# Patient Record
Sex: Female | Born: 1950 | Race: Black or African American | Hispanic: No | Marital: Married | Smoking: Current every day smoker
Health system: Southern US, Community
[De-identification: ages and names within clinical notes are randomized; demographics above are authoritative.]

## PROBLEM LIST (undated history)

## (undated) DIAGNOSIS — T7840XA Allergy, unspecified, initial encounter: Secondary | ICD-10-CM

## (undated) DIAGNOSIS — J45909 Unspecified asthma, uncomplicated: Secondary | ICD-10-CM

## (undated) DIAGNOSIS — E1122 Type 2 diabetes mellitus with diabetic chronic kidney disease: Secondary | ICD-10-CM

## (undated) DIAGNOSIS — I5032 Chronic diastolic (congestive) heart failure: Secondary | ICD-10-CM

## (undated) DIAGNOSIS — G4730 Sleep apnea, unspecified: Secondary | ICD-10-CM

## (undated) DIAGNOSIS — Z8719 Personal history of other diseases of the digestive system: Secondary | ICD-10-CM

## (undated) DIAGNOSIS — M1990 Unspecified osteoarthritis, unspecified site: Secondary | ICD-10-CM

## (undated) DIAGNOSIS — K219 Gastro-esophageal reflux disease without esophagitis: Secondary | ICD-10-CM

## (undated) DIAGNOSIS — E119 Type 2 diabetes mellitus without complications: Secondary | ICD-10-CM

## (undated) DIAGNOSIS — J40 Bronchitis, not specified as acute or chronic: Secondary | ICD-10-CM

## (undated) DIAGNOSIS — K259 Gastric ulcer, unspecified as acute or chronic, without hemorrhage or perforation: Secondary | ICD-10-CM

## (undated) DIAGNOSIS — I10 Essential (primary) hypertension: Secondary | ICD-10-CM

## (undated) DIAGNOSIS — M810 Age-related osteoporosis without current pathological fracture: Secondary | ICD-10-CM

## (undated) DIAGNOSIS — N184 Chronic kidney disease, stage 4 (severe): Secondary | ICD-10-CM

## (undated) HISTORY — DX: Essential (primary) hypertension: I10

## (undated) HISTORY — DX: Unspecified osteoarthritis, unspecified site: M19.90

## (undated) HISTORY — DX: Allergy, unspecified, initial encounter: T78.40XA

## (undated) HISTORY — DX: Type 2 diabetes mellitus with diabetic chronic kidney disease: E11.22

## (undated) HISTORY — PX: TUBAL LIGATION: SHX77

## (undated) HISTORY — DX: Chronic kidney disease, stage 4 (severe): N18.4

## (undated) HISTORY — DX: Gastro-esophageal reflux disease without esophagitis: K21.9

## (undated) HISTORY — DX: Type 2 diabetes mellitus without complications: E11.9

## (undated) HISTORY — DX: Gastric ulcer, unspecified as acute or chronic, without hemorrhage or perforation: K25.9

## (undated) HISTORY — PX: DILATION AND CURETTAGE OF UTERUS: SHX78

## (undated) HISTORY — PX: EYE SURGERY: SHX253

## (undated) HISTORY — DX: Unspecified asthma, uncomplicated: J45.909

## (undated) HISTORY — DX: Age-related osteoporosis without current pathological fracture: M81.0

---
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
## 1997-10-08 ENCOUNTER — Emergency Department (HOSPITAL_COMMUNITY): Admission: EM | Admit: 1997-10-08 | Discharge: 1997-10-08 | Payer: Self-pay | Admitting: Emergency Medicine

## 1998-12-28 ENCOUNTER — Emergency Department (HOSPITAL_COMMUNITY): Admission: EM | Admit: 1998-12-28 | Discharge: 1998-12-28 | Payer: Self-pay | Admitting: Emergency Medicine

## 1998-12-28 ENCOUNTER — Encounter: Payer: Self-pay | Admitting: Emergency Medicine

## 2000-03-04 ENCOUNTER — Ambulatory Visit (HOSPITAL_COMMUNITY): Admission: RE | Admit: 2000-03-04 | Discharge: 2000-03-04 | Payer: Self-pay | Admitting: Internal Medicine

## 2000-03-04 ENCOUNTER — Encounter: Admission: RE | Admit: 2000-03-04 | Discharge: 2000-03-04 | Payer: Self-pay | Admitting: Internal Medicine

## 2000-03-04 ENCOUNTER — Encounter: Payer: Self-pay | Admitting: Internal Medicine

## 2000-03-18 ENCOUNTER — Encounter: Admission: RE | Admit: 2000-03-18 | Discharge: 2000-03-18 | Payer: Self-pay | Admitting: Internal Medicine

## 2000-04-15 ENCOUNTER — Encounter: Admission: RE | Admit: 2000-04-15 | Discharge: 2000-04-15 | Payer: Self-pay | Admitting: Internal Medicine

## 2000-06-28 ENCOUNTER — Encounter: Admission: RE | Admit: 2000-06-28 | Discharge: 2000-06-28 | Payer: Self-pay

## 2000-07-29 ENCOUNTER — Encounter: Admission: RE | Admit: 2000-07-29 | Discharge: 2000-07-29 | Payer: Self-pay | Admitting: Internal Medicine

## 2001-03-03 ENCOUNTER — Encounter: Admission: RE | Admit: 2001-03-03 | Discharge: 2001-03-03 | Payer: Self-pay | Admitting: Internal Medicine

## 2001-03-04 ENCOUNTER — Ambulatory Visit (HOSPITAL_COMMUNITY): Admission: RE | Admit: 2001-03-04 | Discharge: 2001-03-04 | Payer: Self-pay | Admitting: Internal Medicine

## 2001-03-04 ENCOUNTER — Encounter: Payer: Self-pay | Admitting: Internal Medicine

## 2001-03-10 ENCOUNTER — Encounter: Admission: RE | Admit: 2001-03-10 | Discharge: 2001-03-10 | Payer: Self-pay | Admitting: Internal Medicine

## 2001-04-28 ENCOUNTER — Encounter: Admission: RE | Admit: 2001-04-28 | Discharge: 2001-04-28 | Payer: Self-pay | Admitting: Internal Medicine

## 2001-05-19 ENCOUNTER — Encounter: Admission: RE | Admit: 2001-05-19 | Discharge: 2001-05-19 | Payer: Self-pay | Admitting: Internal Medicine

## 2001-11-03 ENCOUNTER — Encounter: Admission: RE | Admit: 2001-11-03 | Discharge: 2001-11-03 | Payer: Self-pay | Admitting: Internal Medicine

## 2002-07-31 ENCOUNTER — Encounter: Admission: RE | Admit: 2002-07-31 | Discharge: 2002-07-31 | Payer: Self-pay | Admitting: Internal Medicine

## 2002-07-31 ENCOUNTER — Ambulatory Visit (HOSPITAL_COMMUNITY): Admission: RE | Admit: 2002-07-31 | Discharge: 2002-07-31 | Payer: Self-pay | Admitting: Internal Medicine

## 2002-07-31 ENCOUNTER — Encounter: Payer: Self-pay | Admitting: Internal Medicine

## 2002-08-14 ENCOUNTER — Encounter: Admission: RE | Admit: 2002-08-14 | Discharge: 2002-08-14 | Payer: Self-pay | Admitting: Internal Medicine

## 2002-10-06 ENCOUNTER — Other Ambulatory Visit: Admission: RE | Admit: 2002-10-06 | Discharge: 2002-10-06 | Payer: Self-pay | Admitting: Internal Medicine

## 2002-10-06 ENCOUNTER — Encounter: Admission: RE | Admit: 2002-10-06 | Discharge: 2002-10-06 | Payer: Self-pay | Admitting: Internal Medicine

## 2002-10-19 ENCOUNTER — Encounter: Admission: RE | Admit: 2002-10-19 | Discharge: 2003-01-17 | Payer: Self-pay | Admitting: Internal Medicine

## 2002-10-21 ENCOUNTER — Encounter: Admission: RE | Admit: 2002-10-21 | Discharge: 2002-10-21 | Payer: Self-pay | Admitting: Internal Medicine

## 2002-10-21 ENCOUNTER — Encounter: Payer: Self-pay | Admitting: Internal Medicine

## 2002-12-02 ENCOUNTER — Encounter: Admission: RE | Admit: 2002-12-02 | Discharge: 2002-12-02 | Payer: Self-pay | Admitting: Internal Medicine

## 2003-12-29 ENCOUNTER — Ambulatory Visit (HOSPITAL_COMMUNITY): Admission: RE | Admit: 2003-12-29 | Discharge: 2003-12-29 | Payer: Self-pay | Admitting: Internal Medicine

## 2003-12-29 ENCOUNTER — Ambulatory Visit: Payer: Self-pay | Admitting: Internal Medicine

## 2004-07-11 ENCOUNTER — Ambulatory Visit: Payer: Self-pay | Admitting: Internal Medicine

## 2004-07-18 ENCOUNTER — Ambulatory Visit: Payer: Self-pay | Admitting: Internal Medicine

## 2005-08-20 ENCOUNTER — Ambulatory Visit: Payer: Self-pay | Admitting: Internal Medicine

## 2005-09-25 ENCOUNTER — Ambulatory Visit (HOSPITAL_COMMUNITY): Admission: RE | Admit: 2005-09-25 | Discharge: 2005-09-25 | Payer: Self-pay | Admitting: Internal Medicine

## 2007-03-24 ENCOUNTER — Emergency Department (HOSPITAL_COMMUNITY): Admission: EM | Admit: 2007-03-24 | Discharge: 2007-03-24 | Payer: Self-pay | Admitting: Emergency Medicine

## 2008-06-23 ENCOUNTER — Emergency Department (HOSPITAL_COMMUNITY): Admission: EM | Admit: 2008-06-23 | Discharge: 2008-06-23 | Payer: Self-pay | Admitting: Emergency Medicine

## 2009-02-28 ENCOUNTER — Encounter: Admission: RE | Admit: 2009-02-28 | Discharge: 2009-02-28 | Payer: Self-pay | Admitting: Family Medicine

## 2009-04-16 ENCOUNTER — Observation Stay (HOSPITAL_COMMUNITY): Admission: EM | Admit: 2009-04-16 | Discharge: 2009-04-16 | Payer: Self-pay | Admitting: Emergency Medicine

## 2009-11-22 ENCOUNTER — Encounter: Admission: RE | Admit: 2009-11-22 | Discharge: 2009-11-22 | Payer: Self-pay | Admitting: Family Medicine

## 2009-11-28 ENCOUNTER — Encounter: Admission: RE | Admit: 2009-11-28 | Discharge: 2009-11-28 | Payer: Self-pay | Admitting: Neurosurgery

## 2009-12-01 ENCOUNTER — Encounter: Admission: RE | Admit: 2009-12-01 | Discharge: 2009-12-01 | Payer: Self-pay | Admitting: Family Medicine

## 2010-01-20 ENCOUNTER — Encounter: Admission: RE | Admit: 2010-01-20 | Discharge: 2010-01-20 | Payer: Self-pay | Admitting: Neurosurgery

## 2010-04-09 ENCOUNTER — Encounter: Payer: Self-pay | Admitting: Family Medicine

## 2010-04-10 ENCOUNTER — Encounter: Payer: Self-pay | Admitting: *Deleted

## 2010-06-04 LAB — URINALYSIS, ROUTINE W REFLEX MICROSCOPIC
Hgb urine dipstick: NEGATIVE
Nitrite: NEGATIVE
Protein, ur: 100 mg/dL — AB
Specific Gravity, Urine: 1.022 (ref 1.005–1.030)
Urobilinogen, UA: 1 mg/dL (ref 0.0–1.0)

## 2010-06-04 LAB — DIFFERENTIAL
Eosinophils Absolute: 0 10*3/uL (ref 0.0–0.7)
Eosinophils Relative: 0 % (ref 0–5)
Lymphocytes Relative: 27 % (ref 12–46)
Lymphs Abs: 1.8 10*3/uL (ref 0.7–4.0)
Monocytes Relative: 15 % — ABNORMAL HIGH (ref 3–12)

## 2010-06-04 LAB — URINE MICROSCOPIC-ADD ON

## 2010-06-04 LAB — COMPREHENSIVE METABOLIC PANEL
ALT: 25 U/L (ref 0–35)
Albumin: 3.7 g/dL (ref 3.5–5.2)
Alkaline Phosphatase: 72 U/L (ref 39–117)
Calcium: 8.6 mg/dL (ref 8.4–10.5)
Potassium: 4.6 mEq/L (ref 3.5–5.1)
Sodium: 134 mEq/L — ABNORMAL LOW (ref 135–145)
Total Protein: 7.5 g/dL (ref 6.0–8.3)

## 2010-06-04 LAB — URINE CULTURE: Culture: NO GROWTH

## 2010-06-04 LAB — CBC
MCHC: 32.8 g/dL (ref 30.0–36.0)
MCV: 83.1 fL (ref 78.0–100.0)
Platelets: 205 10*3/uL (ref 150–400)
RBC: 5.43 MIL/uL — ABNORMAL HIGH (ref 3.87–5.11)
RDW: 14 % (ref 11.5–15.5)

## 2010-06-04 LAB — GLUCOSE, CAPILLARY: Glucose-Capillary: 174 mg/dL — ABNORMAL HIGH (ref 70–99)

## 2010-06-04 LAB — POCT CARDIAC MARKERS
CKMB, poc: 2 ng/mL (ref 1.0–8.0)
Troponin i, poc: <0.05 ng/mL (ref 0.00–0.09)

## 2010-06-04 LAB — LIPASE, BLOOD: Lipase: 25 U/L (ref 11–59)

## 2010-06-28 LAB — DIFFERENTIAL
Basophils Absolute: 0 10*3/uL (ref 0.0–0.1)
Lymphocytes Relative: 24 % (ref 12–46)
Lymphs Abs: 2.1 10*3/uL (ref 0.7–4.0)
Neutro Abs: 5.7 10*3/uL (ref 1.7–7.7)
Neutrophils Relative %: 66 % (ref 43–77)

## 2010-06-28 LAB — CBC
Platelets: 197 10*3/uL (ref 150–400)
RDW: 14.1 % (ref 11.5–15.5)
WBC: 8.7 10*3/uL (ref 4.0–10.5)

## 2010-06-28 LAB — POCT I-STAT, CHEM 8
BUN: 15 mg/dL (ref 6–23)
Chloride: 109 mEq/L (ref 96–112)
Creatinine, Ser: 1.2 mg/dL (ref 0.4–1.2)
Potassium: 4.1 mEq/L (ref 3.5–5.1)
Sodium: 139 mEq/L (ref 135–145)
TCO2: 23 mmol/L (ref 0–100)

## 2010-06-28 LAB — URINALYSIS, ROUTINE W REFLEX MICROSCOPIC
Glucose, UA: NEGATIVE mg/dL
Ketones, ur: NEGATIVE mg/dL
Leukocytes, UA: NEGATIVE
Protein, ur: 30 mg/dL — AB
Urobilinogen, UA: 0.2 mg/dL (ref 0.0–1.0)

## 2010-06-28 LAB — URINE MICROSCOPIC-ADD ON

## 2010-06-28 LAB — POCT CARDIAC MARKERS
CKMB, poc: <1 ng/mL — ABNORMAL LOW (ref 1.0–8.0)
Myoglobin, poc: 184 ng/mL (ref 12–200)
Troponin i, poc: <0.05 ng/mL (ref 0.00–0.09)

## 2010-12-07 LAB — I-STAT 8, (EC8 V) (CONVERTED LAB)
BUN: 9
Bicarbonate: 25.7 — ABNORMAL HIGH
Hemoglobin: 15.3 — ABNORMAL HIGH
Operator id: 281221
Potassium: 3.7
Sodium: 140
TCO2: 27

## 2012-12-05 DIAGNOSIS — M109 Gout, unspecified: Secondary | ICD-10-CM | POA: Insufficient documentation

## 2013-09-02 DIAGNOSIS — N183 Chronic kidney disease, stage 3 unspecified: Secondary | ICD-10-CM | POA: Insufficient documentation

## 2013-09-02 DIAGNOSIS — E1122 Type 2 diabetes mellitus with diabetic chronic kidney disease: Secondary | ICD-10-CM

## 2013-09-02 DIAGNOSIS — I10 Essential (primary) hypertension: Secondary | ICD-10-CM | POA: Insufficient documentation

## 2013-09-08 ENCOUNTER — Encounter: Payer: Self-pay | Admitting: Interventional Cardiology

## 2013-10-13 ENCOUNTER — Ambulatory Visit: Payer: Self-pay | Admitting: Cardiovascular Disease

## 2013-10-19 ENCOUNTER — Ambulatory Visit: Payer: Self-pay | Admitting: Interventional Cardiology

## 2013-10-27 ENCOUNTER — Ambulatory Visit: Payer: 59 | Admitting: Interventional Cardiology

## 2013-11-11 DIAGNOSIS — Z720 Tobacco use: Secondary | ICD-10-CM | POA: Insufficient documentation

## 2014-07-02 ENCOUNTER — Ambulatory Visit: Payer: Medicaid Other | Admitting: Family Medicine

## 2014-07-23 ENCOUNTER — Ambulatory Visit: Payer: Medicaid Other | Admitting: Family Medicine

## 2014-10-15 ENCOUNTER — Ambulatory Visit: Payer: Medicaid Other | Admitting: Internal Medicine

## 2014-10-25 ENCOUNTER — Ambulatory Visit (INDEPENDENT_AMBULATORY_CARE_PROVIDER_SITE_OTHER): Payer: Medicare HMO | Admitting: Family

## 2014-10-25 ENCOUNTER — Other Ambulatory Visit (INDEPENDENT_AMBULATORY_CARE_PROVIDER_SITE_OTHER): Payer: Medicare HMO

## 2014-10-25 ENCOUNTER — Encounter: Payer: Self-pay | Admitting: Family

## 2014-10-25 VITALS — BP 110/82 | HR 86 | Temp 98.5°F | Resp 18 | Ht 68.0 in | Wt 249.0 lb

## 2014-10-25 DIAGNOSIS — E1165 Type 2 diabetes mellitus with hyperglycemia: Secondary | ICD-10-CM | POA: Diagnosis not present

## 2014-10-25 DIAGNOSIS — E1149 Type 2 diabetes mellitus with other diabetic neurological complication: Secondary | ICD-10-CM | POA: Diagnosis not present

## 2014-10-25 DIAGNOSIS — L304 Erythema intertrigo: Secondary | ICD-10-CM | POA: Diagnosis not present

## 2014-10-25 DIAGNOSIS — K219 Gastro-esophageal reflux disease without esophagitis: Secondary | ICD-10-CM | POA: Insufficient documentation

## 2014-10-25 DIAGNOSIS — E119 Type 2 diabetes mellitus without complications: Secondary | ICD-10-CM | POA: Insufficient documentation

## 2014-10-25 DIAGNOSIS — M545 Low back pain: Secondary | ICD-10-CM

## 2014-10-25 DIAGNOSIS — G8929 Other chronic pain: Secondary | ICD-10-CM

## 2014-10-25 DIAGNOSIS — E1140 Type 2 diabetes mellitus with diabetic neuropathy, unspecified: Secondary | ICD-10-CM | POA: Insufficient documentation

## 2014-10-25 DIAGNOSIS — IMO0002 Reserved for concepts with insufficient information to code with codable children: Secondary | ICD-10-CM

## 2014-10-25 DIAGNOSIS — E118 Type 2 diabetes mellitus with unspecified complications: Secondary | ICD-10-CM

## 2014-10-25 LAB — BASIC METABOLIC PANEL
BUN: 22 mg/dL (ref 6–23)
CHLORIDE: 104 meq/L (ref 96–112)
CO2: 23 mEq/L (ref 19–32)
Calcium: 9.6 mg/dL (ref 8.4–10.5)
Creatinine, Ser: 1.96 mg/dL — ABNORMAL HIGH (ref 0.40–1.20)
GFR: 33.16 mL/min — AB (ref >60.00)
Glucose, Bld: 238 mg/dL — ABNORMAL HIGH (ref 70–99)
Potassium: 4.2 mEq/L (ref 3.5–5.1)
Sodium: 137 mEq/L (ref 135–145)

## 2014-10-25 LAB — HEMOGLOBIN A1C: HEMOGLOBIN A1C: 8.4 % — AB (ref 4.6–6.5)

## 2014-10-25 MED ORDER — OXYCODONE-ACETAMINOPHEN 5-325 MG PO TABS: 1.0000 | ORAL_TABLET | Freq: Three times a day (TID) | ORAL | Status: DC | PRN

## 2014-10-25 MED ORDER — CLOTRIMAZOLE-BETAMETHASONE 1-0.05 % EX CREA
1.0000 | TOPICAL_CREAM | Freq: Two times a day (BID) | CUTANEOUS | Status: DC
Start: 2014-10-25 — End: 2015-02-25

## 2014-10-25 MED ORDER — GABAPENTIN 300 MG PO CAPS: ORAL_CAPSULE | ORAL | Status: DC

## 2014-10-25 NOTE — Assessment & Plan Note (Signed)
Rash presentation consistent with intertrigo. Start Lotrisone cream as needed. Keep area clean and dry. Follow-up if symptoms worsen or fail to improve.

## 2014-10-25 NOTE — Assessment & Plan Note (Signed)
Currently maintained on glipizide. Takes her medication as prescribed and denies hypoglycemic events. Obtain A1c. Follow up pending A1c results.

## 2014-10-25 NOTE — Patient Instructions (Signed)
Thank you for choosing [facility].  Summary/Instructions:  Your prescription(s) have been submitted to your pharmacy or been printed and provided for you. Please take as directed and contact our office if you believe you are having problem(s) with the medication(s) or have any questions.  Please stop by the lab on the basement level of the building for your blood work. Your results will be released to [place] (or called to you) after review, usually within 72 hours after test completion. If any changes need to be made, you will be notified at that same time.  Referrals have been made during this visit. You should expect to hear back from our schedulers in about 7-10 days in regards to establishing an appointment with the specialists we discussed.   If your symptoms worsen or fail to improve, please contact our office for further instruction, or in case of emergency go directly to the emergency room at the closest medical facility.    Intertrigo Intertrigo is a skin condition that occurs in between folds of skin in places on the body that rub together a lot and do not get much ventilation. It is caused by heat, moisture, friction, sweat retention, and lack of air circulation, which produces red, irritated patches and, sometimes, scaling or drainage. People who have diabetes, who are obese, or who have treatment with antibiotics are at increased risk for intertrigo. The most common sites for intertrigo to occur include:  The groin.  The breasts.  The armpits.  Folds of abdominal skin.  Webbed spaces between the fingers or toes. Intertrigo may be aggravated by:  Sweat.  Feces.  Yeast or bacteria that are present near skin folds.  Urine.  Vaginal discharge. HOME CARE INSTRUCTIONS  The following steps can be taken to reduce friction and keep the affected area cool and dry:  Expose skin folds to the air.  Keep deep skin folds separated with cotton or linen cloth. Avoid  tight fitting clothing that could cause chafing.  Wear open-toed shoes or sandals to help reduce moisture between the toes.  Apply absorbent powders to affected areas as directed by your caregiver.  Apply over-the-counter barrier pastes, such as zinc oxide, as directed by your caregiver.  If you develop a fungal infection in the affected area, your caregiver may have you use antifungal creams. SEEK MEDICAL CARE IF:   The rash is not improving after 1 week of treatment.  The rash is getting worse (more red, more swollen, more painful, or spreading).  You have a fever or chills. MAKE SURE YOU:   Understand these instructions.  Will watch your condition.  Will get help right away if you are not doing well or get worse. Document Released: 03/05/2005 Document Revised: 05/28/2011 Document Reviewed: 08/18/2009 [facility] Patient Information 2015 [place], [place]. This information is not intended to replace advice given to you by your health care provider. Make sure you discuss any questions you have with your health care provider.

## 2014-10-25 NOTE — Assessment & Plan Note (Signed)
Symptoms are rib pain consistent with GERD or irritation of her hiatal hernia. Continue current treatment with ranitidine. Follow-up if symptoms worsen or fail to improve.

## 2014-10-25 NOTE — Assessment & Plan Note (Addendum)
Chronic pain noted in multiple joints including back and hips. Previously maintained on Percocet. Discussed appropriate pain management techniques and physical activity combined with exercise. Denies constipation. Refer patient to pain management for chronic pain management. Refill Percocet pending pain management referral.

## 2014-10-25 NOTE — Assessment & Plan Note (Signed)
Numbness and tingling symptoms consistent with diabetic neuropathy. Obtain A1c to determine current diabetic status. Continue gabapentin for neuropathic pain as needed. Diabetic foot exam completed and within normal limits. Follow-up pending A1c.

## 2014-10-25 NOTE — Progress Notes (Signed)
Pre visit review using our clinic review tool, if applicable. No additional management support is needed unless otherwise documented below in the visit note. 

## 2014-10-25 NOTE — Progress Notes (Signed)
Subjective:    Patient ID: Zoe Clarke, female    DOB: Dec 04, 1950, 64 y.o.   MRN: [id number]  Chief Complaint  Patient presents with  . Establish Care    think she has a rash on her back, itches really bad after shower, feet feel numbs, sharp pain on under her left rib    HPI:  Zoe Clarke is a 64 y.o. female with a PMH of seasonal allergies, arthritis, asthma, diabetes, GERD, hypertension, and tubal ligation who presents today for an office visit to establish care.   1.) Feet feel numb - Associated symptom of numbness and tingling located in her feet and has been going on for at least a week. Modifying factors include gabapentin does help along with her husband rubbing her feet. Has previously tried Lyrica and did not like how it make her felt. She had her last A1c done about 1 year ago and is currently maintained on glipizide. Takes the medication daily and denies adverse side effects or hypoglycemic events.   2.) Rash - Associated symptom of a spot located on her back has been going on for . Modifying factors include calamine and lotions which have helped a little with the itching. The rash is described as primarily itchy and gets worse after she gets out of the shower.  3.) Pain under rib - Associated symptom of a pain located just under her left breast has been going on since Wednesday. Notes that the pain is described as sharp and is modified with the use of antiacids. Timing of the symptoms is worse at night and especially when lying down. Denies fevers, chills, nausea, vomiting, or blood in stool.   No Known Allergies   No outpatient prescriptions prior to visit.   No facility-administered medications prior to visit.     Past Medical History  Diagnosis Date  . Arthritis   . Diabetes mellitus without complication   . Asthma   . GERD (gastroesophageal reflux disease)   . Allergy   . Hypertension      Past Surgical History  Procedure Laterality Date  . Tubal  ligation       Family History  Problem Relation Age of Onset  . Arthritis Mother   . Heart disease Mother   . Hypertension Mother   . Arthritis Maternal Grandmother   . Hypertension Maternal Grandmother   . Stroke Maternal Grandfather      History   Social History  . Marital Status: Married    Spouse Name: N/A  . Number of Children: 2  . Years of Education: 12   Occupational History  . Housewife    Social History Main Topics  . Smoking status: Current Every Day Smoker -- 0.50 packs/day for 39 years    Types: Cigarettes  . Smokeless tobacco: Never Used  . Alcohol Use: No  . Drug Use: Yes    Special: Marijuana  . Sexual Activity: Not on file   Other Topics Concern  . Not on file   Social History Narrative   Fun: Race car games - PS2/3, paint by number, board and card games, puzzle books   Feels safe at home and denies abuse    Review of Systems  Constitutional: Negative for fever and chills.  Gastrointestinal: Positive for abdominal pain. Negative for nausea, vomiting, diarrhea, constipation and blood in stool.  Endocrine: Negative for polydipsia, polyphagia and polyuria.  Neurological: Positive for numbness. Negative for weakness.      Objective:  BP 110/82 mmHg  Pulse 86  Temp(Src) 98.5 F (36.9 C) (Oral)  Resp 18  Ht 5\' 8"  (1.727 m)  Wt 249 lb (112.946 kg)  BMI 37.87 kg/m2  SpO2 98% Nursing note and vital signs reviewed.  Physical Exam  Constitutional: She is oriented to person, place, and time. She appears well-developed and well-nourished. No distress.  Cardiovascular: Normal rate, regular rhythm, normal heart sounds and intact distal pulses.   Pulmonary/Chest: Effort normal and breath sounds normal.  Abdominal: Soft. She exhibits no mass. There is no tenderness. There is no rebound and no guarding.  Neurological: She is alert and oriented to person, place, and time.  Diabetic foot exam - bilateral feet are free from skin breakdown, cuts, and  abrasions. Toenails are neatly trimmed. Pulses are intact and appropriate. Sensation is intact to monofilament bilaterally.  Skin: Skin is warm and dry. Rash noted.  Diffuse scaly rash noted in areas of folded skin.  Psychiatric: She has a normal mood and affect. Her behavior is normal. Judgment and thought content normal.       Assessment & Plan:   Problem List Items Addressed This Visit      Digestive   GERD (gastroesophageal reflux disease)    Symptoms are rib pain consistent with GERD or irritation of her hiatal hernia. Continue current treatment with ranitidine. Follow-up if symptoms worsen or fail to improve.      Relevant Medications   ranitidine (ZANTAC) 150 MG tablet     Nervous and Auditory   Diabetic neuropathy    Numbness and tingling symptoms consistent with diabetic neuropathy. Obtain A1c to determine current diabetic status. Continue gabapentin for neuropathic pain as needed. Diabetic foot exam completed and within normal limits. Follow-up pending A1c.      Relevant Medications   glipiZIDE (GLUCOTROL) 10 MG tablet   lisinopril-hydrochlorothiazide (PRINZIDE,ZESTORETIC) 20-25 MG per tablet     Musculoskeletal and Integument   Intertrigo    Rash presentation consistent with intertrigo. Start Lotrisone cream as needed. Keep area clean and dry. Follow-up if symptoms worsen or fail to improve.        Other   Type 2 diabetes mellitus, uncontrolled - Primary    Currently maintained on glipizide. Takes her medication as prescribed and denies hypoglycemic events. Obtain A1c. Follow up pending A1c results.      Relevant Medications   glipiZIDE (GLUCOTROL) 10 MG tablet   lisinopril-hydrochlorothiazide (PRINZIDE,ZESTORETIC) 20-25 MG per tablet   Other Relevant Orders   Basic Metabolic Panel (BMET)   Hemoglobin A1c   Chronic pain    Chronic pain noted in multiple joints including back and hips. Previously maintained on Percocet. Discussed appropriate pain management  techniques and physical activity combined with exercise. Denies constipation. Refer patient to pain management for chronic pain management. Refill Percocet pending pain management referral.      Relevant Medications   gabapentin (NEURONTIN) 300 MG capsule   oxyCODONE-acetaminophen (PERCOCET/ROXICET) 5-325 MG per tablet   Other Relevant Orders   Ambulatory referral to Pain Clinic

## 2014-10-26 ENCOUNTER — Other Ambulatory Visit: Payer: Self-pay | Admitting: Family

## 2014-10-26 ENCOUNTER — Telehealth: Payer: Self-pay | Admitting: Family

## 2014-10-26 DIAGNOSIS — R7989 Other specified abnormal findings of blood chemistry: Secondary | ICD-10-CM

## 2014-10-26 MED ORDER — GLIPIZIDE 10 MG PO TABS: 15.0000 mg | ORAL_TABLET | Freq: Every day | ORAL | Status: DC

## 2014-10-26 NOTE — Telephone Encounter (Signed)
Please inform patient that her A1c is 8.4 with a goal of <7.0. Therefore I would like to increase her glipizide to 15 mg daily. Please have her check her blood sugars and ensure she eats on a regular basis to avoid hypoglycemia or low blood sugar. Also her kidney function shows that her GFR is 33 indicating that she is in kidney disease Stage III and may transition to IV if we do not get her blood sugar controlled. I would like to recheck her A1c in 3 months and her kidney function in 2 weeks after hydrating well. No OV is needed. Lab has been placed for the kidney function.

## 2014-10-29 NOTE — Telephone Encounter (Signed)
Yes, that is fine. Please have her watch her blood sugar daily and be careful for low blood sugar events until we know how it will effect her. Follow up in 2 weeks for kidney function and med check.

## 2014-10-29 NOTE — Telephone Encounter (Signed)
Pt aware of results would like to know if she can take 20 mgs. The pills she has now are very small and are at 10 mg. She said they are too small to cut in half. Please advise.

## 2014-11-01 NOTE — Telephone Encounter (Signed)
Pt aware.

## 2014-11-23 ENCOUNTER — Encounter: Payer: Self-pay | Admitting: Family

## 2014-11-23 ENCOUNTER — Ambulatory Visit (INDEPENDENT_AMBULATORY_CARE_PROVIDER_SITE_OTHER): Payer: Medicare HMO | Admitting: Family

## 2014-11-23 ENCOUNTER — Ambulatory Visit: Payer: Medicare HMO | Admitting: Family

## 2014-11-23 ENCOUNTER — Other Ambulatory Visit (INDEPENDENT_AMBULATORY_CARE_PROVIDER_SITE_OTHER): Payer: Medicare HMO

## 2014-11-23 VITALS — BP 144/82 | HR 85 | Temp 98.2°F | Resp 18 | Ht 68.0 in | Wt 258.4 lb

## 2014-11-23 DIAGNOSIS — R748 Abnormal levels of other serum enzymes: Secondary | ICD-10-CM

## 2014-11-23 DIAGNOSIS — M25551 Pain in right hip: Secondary | ICD-10-CM | POA: Diagnosis not present

## 2014-11-23 DIAGNOSIS — G8929 Other chronic pain: Secondary | ICD-10-CM

## 2014-11-23 DIAGNOSIS — R7989 Other specified abnormal findings of blood chemistry: Secondary | ICD-10-CM

## 2014-11-23 DIAGNOSIS — M25552 Pain in left hip: Secondary | ICD-10-CM

## 2014-11-23 LAB — BASIC METABOLIC PANEL
BUN: 29 mg/dL — ABNORMAL HIGH (ref 6–23)
CALCIUM: 9.2 mg/dL (ref 8.4–10.5)
CO2: 26 mEq/L (ref 19–32)
CREATININE: 1.8 mg/dL — AB (ref 0.40–1.20)
Chloride: 109 mEq/L (ref 96–112)
GFR: 36.58 mL/min — AB (ref >60.00)
Glucose, Bld: 143 mg/dL — ABNORMAL HIGH (ref 70–99)
Potassium: 4.1 mEq/L (ref 3.5–5.1)
Sodium: 142 mEq/L (ref 135–145)

## 2014-11-23 MED ORDER — DICLOFENAC SODIUM 1 % TD GEL: 2.0000 g | Freq: Four times a day (QID) | TRANSDERMAL | Status: DC

## 2014-11-23 MED ORDER — OXYCODONE-ACETAMINOPHEN 7.5-325 MG PO TABS
1.0000 | ORAL_TABLET | ORAL | Status: DC | PRN
Start: 2014-11-23 — End: 2015-01-13

## 2014-11-23 MED ORDER — PREDNISONE 10 MG PO TABS: ORAL_TABLET | ORAL | Status: DC

## 2014-11-23 NOTE — Assessment & Plan Note (Signed)
Symptoms and exam most likely related to trochanteric bursitis, however cannot rule out osteoarthritis. Indicates orthopedics states there is nothing that can be done for her hips and she refuses cortisone joint injections. Start prednisone taper. Start diclofenac gel. Increase Percocet to 7.5-325. Referral to pain clinic is currently in process. Home exercise therapy program initiated. Follow-up if symptoms worsen or fail to improve.

## 2014-11-23 NOTE — Progress Notes (Signed)
Subjective:    Patient ID: Zoe Clarke, female    DOB: 1950/11/12, 63 y.o.   MRN: [id number]  Chief Complaint  Patient presents with  . Follow-up    states that her left hip all the down to her foot hurts like "crazy", no more pain medication and would like to get a higher dose bc the strength isn't strong enough    HPI:  Tammica Bergmann is a 64 y.o. female with a PMH of Type 2 diabetes, GERD, diabetic neuropathy and chronic pain who presents today for an office follow up.  1.) Bilateral hip pain - Associated symptoms of pain located in her bilateral hips has been going on for a while but has progressively worsened for about the last 5 days. Timing of symptoms is worse when she is trying to sleep. Pains are described as sharp with a severity of 10/10 located on both sides. Modifying treatments include taking her medicine and nerve pills. Notes that she has to occasionally take 1.5-2 pills of her pain medication. Does do some massage on occasion. Does perform exercises and walks around the house with her walker and is functional enough to complete her activities of daily living. Notes she only takes the pain pills only at night. Denies constipation with current pain medication.    No Known Allergies   Current Outpatient Prescriptions on File Prior to Visit  Medication Sig Dispense Refill  . albuterol (PROVENTIL HFA;VENTOLIN HFA) 108 (90 BASE) MCG/ACT inhaler Inhale 2 puffs into the lungs every 6 (six) hours as needed for wheezing or shortness of breath.    . clotrimazole-betamethasone (LOTRISONE) cream Apply 1 application topically 2 (two) times daily. 30 g 0  . gabapentin (NEURONTIN) 300 MG capsule Take 1 tablet in the morning, 1 tablet in the afternoon and 2 at bedtime. 120 capsule 1  . glipiZIDE (GLUCOTROL) 10 MG tablet Take 1.5 tablets (15 mg total) by mouth daily before breakfast. 45 tablet 0  . lisinopril-hydrochlorothiazide (PRINZIDE,ZESTORETIC) 20-25 MG per tablet Take 1 tablet by  mouth daily.    Marland Kitchen olopatadine (PATANOL) 0.1 % ophthalmic solution 1 drop 2 (two) times daily.    . ranitidine (ZANTAC) 150 MG tablet Take 150 mg by mouth 2 (two) times daily.    Marland Kitchen triamcinolone cream (KENALOG) 0.1 % Apply 1 application topically 2 (two) times daily.     No current facility-administered medications on file prior to visit.    Past Medical History  Diagnosis Date  . Arthritis   . Diabetes mellitus without complication   . Asthma   . GERD (gastroesophageal reflux disease)   . Allergy   . Hypertension     Review of Systems  Constitutional: Negative for fever and chills.  Musculoskeletal:       Positive for bilateral hip pain  Neurological: Negative for weakness and numbness.      Objective:    BP 144/82 mmHg  Pulse 85  Temp(Src) 98.2 F (36.8 C) (Oral)  Resp 18  Ht 5\' 8"  (1.727 m)  Wt 258 lb 6.4 oz (117.209 kg)  BMI 39.30 kg/m2  SpO2 97% Nursing note and vital signs reviewed.  Physical Exam  Constitutional: She is oriented to person, place, and time. She appears well-developed and well-nourished. No distress.  Cardiovascular: Normal rate, regular rhythm, normal heart sounds and intact distal pulses.   Pulmonary/Chest: Effort normal and breath sounds normal.  Musculoskeletal:  Bilateral hips - no obvious deformity, discoloration, or edema noted. Palpable tenderness superior to greater trochanter  bursa on bilateral sides. No back pain elicited. Straight leg raise is negative. Strength is 4+ out of 5 bilaterally in all directions. Distal pulses and sensation are intact and appropriate.  Neurological: She is alert and oriented to person, place, and time.  Skin: Skin is warm and dry.  Psychiatric: She has a normal mood and affect. Her behavior is normal. Judgment and thought content normal.      Assessment & Plan:   Problem List Items Addressed This Visit      Other   Chronic pain - Primary   Relevant Medications   predniSONE (DELTASONE) 10 MG tablet    oxyCODONE-acetaminophen (PERCOCET) 7.5-325 MG per tablet   Bilateral hip pain    Symptoms and exam most likely related to trochanteric bursitis, however cannot rule out osteoarthritis. Indicates orthopedics states there is nothing that can be done for her hips and she refuses cortisone joint injections. Start prednisone taper. Start diclofenac gel. Increase Percocet to 7.5-325. Referral to pain clinic is currently in process. Home exercise therapy program initiated. Follow-up if symptoms worsen or fail to improve.      Relevant Medications   predniSONE (DELTASONE) 10 MG tablet   oxyCODONE-acetaminophen (PERCOCET) 7.5-325 MG per tablet   diclofenac sodium (VOLTAREN) 1 % GEL

## 2014-11-23 NOTE — Patient Instructions (Signed)
Thank you for choosing [facility].  Summary/Instructions:  Your prescription(s) have been submitted to your pharmacy or been printed and provided for you. Please take as directed and contact our office if you believe you are having problem(s) with the medication(s) or have any questions.  If your symptoms worsen or fail to improve, please contact our office for further instruction, or in case of emergency go directly to the emergency room at the closest medical facility.    Hip Exercises RANGE OF MOTION (ROM) AND STRETCHING EXERCISES  These exercises may help you when beginning to rehabilitate your injury. Doing them too aggressively can worsen your condition. Complete them slowly and gently. Your symptoms may resolve with or without further involvement from your physician, physical therapist or athletic trainer. While completing these exercises, remember:   Restoring tissue flexibility helps normal motion to return to the joints. This allows healthier, less painful movement and activity.  An effective stretch should be held for at least 30 seconds.  A stretch should never be painful. You should only feel a gentle lengthening or release in the stretched tissue. If these stretches worsen your symptoms even when done gently, consult your physician, physical therapist or athletic trainer. STRETCH - Hamstrings, Supine   Lie on your back. Loop a belt or towel over the ball of your right / left foot.  Straighten your right / left knee and slowly pull on the belt to raise your leg. Do not allow the right / left knee to bend. Keep your opposite leg flat on the floor.  Raise the leg until you feel a gentle stretch behind your right / left knee or thigh. Hold this position for __________ seconds. Repeat __________ times. Complete this stretch __________ times per day.  STRETCH - Hip Rotators   Lie on your back on a firm surface. Grasp your right / left knee with your right / left hand and  your ankle with your opposite hand.  Keeping your hips and shoulders firmly planted, gently pull your right / left knee and rotate your lower leg toward your opposite shoulder until you feel a stretch in your buttocks.  Hold this stretch for __________ seconds. Repeat this stretch __________ times. Complete this stretch __________ times per day. STRETCH - Hamstrings/Adductors, V-Sit   Sit on the floor with your legs extended in a large "V," keeping your knees straight.  With your head and chest upright, bend at your waist reaching for your right foot to stretch your left adductors.  You should feel a stretch in your left inner thigh. Hold for __________ seconds.  Return to the upright position to relax your leg muscles.  Continuing to keep your chest upright, bend straight forward at your waist to stretch your hamstrings.  You should feel a stretch behind both of your thighs and/or knees. Hold for __________ seconds.  Return to the upright position to relax your leg muscles.  Repeat steps 2 through 4 for opposite leg. Repeat __________ times. Complete this exercise __________ times per day.  STRETCHING - Hip Flexors, Lunge  Half kneel with your right / left knee on the floor and your opposite knee bent and directly over your ankle.  Keep good posture with your head over your shoulders. Tighten your buttocks to point your tailbone downward; this will prevent your back from arching too much.  You should feel a gentle stretch in the front of your thigh and/or hip. If you do not feel any resistance, slightly slide your  opposite foot forward and then slowly lunge forward so your knee once again lines up over your ankle. Be sure your tailbone remains pointed downward.  Hold this stretch for __________ seconds. Repeat __________ times. Complete this stretch __________ times per day. STRENGTHENING EXERCISES These exercises may help you when beginning to rehabilitate your injury. They may  resolve your symptoms with or without further involvement from your physician, physical therapist or athletic trainer. While completing these exercises, remember:   Muscles can gain both the endurance and the strength needed for everyday activities through controlled exercises.  Complete these exercises as instructed by your physician, physical therapist or athletic trainer. Progress the resistance and repetitions only as guided.  You may experience muscle soreness or fatigue, but the pain or discomfort you are trying to eliminate should never worsen during these exercises. If this pain does worsen, stop and make certain you are following the directions exactly. If the pain is still present after adjustments, discontinue the exercise until you can discuss the trouble with your clinician. STRENGTH - Hip Extensors, Bridge   Lie on your back on a firm surface. Bend your knees and place your feet flat on the floor.  Tighten your buttocks muscles and lift your bottom off the floor until your trunk is level with your thighs. You should feel the muscles in your buttocks and back of your thighs working. If you do not feel these muscles, slide your feet 1-2 inches further away from your buttocks.  Hold this position for __________ seconds.  Slowly lower your hips to the starting position and allow your buttock muscles relax completely before beginning the next repetition.  If this exercise is too easy, you may cross your arms over your chest. Repeat __________ times. Complete this exercise __________ times per day.  STRENGTH - Hip Abductors, Straight Leg Raises  Be aware of your form throughout the entire exercise so that you exercise the correct muscles. Sloppy form means that you are not strengthening the correct muscles.  Lie on your side so that your head, shoulders, knee and hip line up. You may bend your lower knee to help maintain your balance. Your right / left leg should be on top.  Roll your  hips slightly forward, so that your hips are stacked directly over each other and your right / left knee is facing forward.  Lift your top leg up 4-6 inches, leading with your heel. Be sure that your foot does not drift forward or that your knee does not roll toward the ceiling.  Hold this position for __________ seconds. You should feel the muscles in your outer hip lifting (you may not notice this until your leg begins to tire).  Slowly lower your leg to the starting position. Allow the muscles to fully relax before beginning the next repetition. Repeat __________ times. Complete this exercise __________ times per day.  STRENGTH - Hip Adductors, Straight Leg Raises   Lie on your side so that your head, shoulders, knee and hip line up. You may place your upper foot in front to help maintain your balance. Your right / left leg should be on the bottom.  Roll your hips slightly forward, so that your hips are stacked directly over each other and your right / left knee is facing forward.  Tense the muscles in your inner thigh and lift your bottom leg 4-6 inches. Hold this position for __________ seconds.  Slowly lower your leg to the starting position. Allow the muscles to  fully relax before beginning the next repetition. Repeat __________ times. Complete this exercise __________ times per day.  STRENGTH - Quadriceps, Straight Leg Raises  Quality counts! Watch for signs that the quadriceps muscle is working to insure you are strengthening the correct muscles and not "cheating" by substituting with healthier muscles.  Lay on your back with your right / left leg extended and your opposite knee bent.  Tense the muscles in the front of your right / left thigh. You should see either your knee cap slide up or increased dimpling just above the knee. Your thigh may even quiver.  Tighten these muscles even more and raise your leg 4 to 6 inches off the floor. Hold for right / left seconds.  Keeping  these muscles tense, lower your leg.  Relax the muscles slowly and completely in between each repetition. Repeat __________ times. Complete this exercise __________ times per day.  STRENGTH - Hip Abductors, Standing  Tie one end of a rubber exercise band/tubing to a secure surface (table, pole) and tie a loop at the other end.  Place the loop around your right / left ankle. Keeping your ankle with the band directly opposite of the secured end, step away until there is tension in the tube/band.  Hold onto a chair as needed for balance.  Keeping your back upright, your shoulders over your hips, and your toes pointing forward, lift your right / left leg out to your side. Be sure to lift your leg with your hip muscles. Do not "throw" your leg or tip your body to lift your leg.  Slowly and with control, return to the starting position. Repeat exercise __________ times. Complete this exercise __________ times per day.  STRENGTH - Quadriceps, Squats  Stand in a door frame so that your feet and knees are in line with the frame.  Use your hands for balance, not support, on the frame.  Slowly lower your weight, bending at the hips and knees. Keep your lower legs upright so that they are parallel with the door frame. Squat only within the range that does not increase your knee pain. Never let your hips drop below your knees.  Slowly return upright, pushing with your legs, not pulling with your hands. Document Released: 03/23/2005 Document Revised: 05/28/2011 Document Reviewed: 06/17/2008 [facility] Patient Information 2015 [place], [place]. This information is not intended to replace advice given to you by your health care provider. Make sure you discuss any questions you have with your health care provider.

## 2014-11-23 NOTE — Progress Notes (Signed)
Pre visit review using our clinic review tool, if applicable. No additional management support is needed unless otherwise documented below in the visit note. 

## 2014-11-24 ENCOUNTER — Telehealth: Payer: Self-pay | Admitting: Family

## 2014-11-24 NOTE — Telephone Encounter (Signed)
Please inform patient that her kidney function indicates that she is in Chronic Kidney Disease Stage 3. The treatment for this is to ensure she has good blood sugar and blood pressure control. We will plan to follow up in 3 months to recheck her blood pressure and kidney function.

## 2014-11-25 NOTE — Telephone Encounter (Signed)
Pt aware of results 

## 2015-01-13 ENCOUNTER — Ambulatory Visit (INDEPENDENT_AMBULATORY_CARE_PROVIDER_SITE_OTHER): Payer: Medicare HMO | Admitting: Physician Assistant

## 2015-01-13 ENCOUNTER — Encounter: Payer: Self-pay | Admitting: Physician Assistant

## 2015-01-13 VITALS — BP 150/96 | HR 89 | Temp 100.0°F | Resp 18 | Ht 68.0 in | Wt 248.2 lb

## 2015-01-13 DIAGNOSIS — K219 Gastro-esophageal reflux disease without esophagitis: Secondary | ICD-10-CM

## 2015-01-13 DIAGNOSIS — Z114 Encounter for screening for human immunodeficiency virus [HIV]: Secondary | ICD-10-CM | POA: Diagnosis not present

## 2015-01-13 DIAGNOSIS — R062 Wheezing: Secondary | ICD-10-CM | POA: Diagnosis not present

## 2015-01-13 DIAGNOSIS — G8929 Other chronic pain: Secondary | ICD-10-CM

## 2015-01-13 DIAGNOSIS — Z1159 Encounter for screening for other viral diseases: Secondary | ICD-10-CM | POA: Diagnosis not present

## 2015-01-13 DIAGNOSIS — E1149 Type 2 diabetes mellitus with other diabetic neurological complication: Secondary | ICD-10-CM

## 2015-01-13 DIAGNOSIS — I10 Essential (primary) hypertension: Secondary | ICD-10-CM

## 2015-01-13 DIAGNOSIS — R300 Dysuria: Secondary | ICD-10-CM | POA: Diagnosis not present

## 2015-01-13 DIAGNOSIS — M25551 Pain in right hip: Secondary | ICD-10-CM | POA: Diagnosis not present

## 2015-01-13 DIAGNOSIS — R059 Cough, unspecified: Secondary | ICD-10-CM

## 2015-01-13 DIAGNOSIS — E118 Type 2 diabetes mellitus with unspecified complications: Secondary | ICD-10-CM | POA: Diagnosis not present

## 2015-01-13 DIAGNOSIS — E1122 Type 2 diabetes mellitus with diabetic chronic kidney disease: Secondary | ICD-10-CM

## 2015-01-13 DIAGNOSIS — Z23 Encounter for immunization: Secondary | ICD-10-CM | POA: Diagnosis not present

## 2015-01-13 DIAGNOSIS — D72829 Elevated white blood cell count, unspecified: Secondary | ICD-10-CM

## 2015-01-13 DIAGNOSIS — R05 Cough: Secondary | ICD-10-CM | POA: Diagnosis not present

## 2015-01-13 DIAGNOSIS — M25561 Pain in right knee: Secondary | ICD-10-CM | POA: Diagnosis not present

## 2015-01-13 DIAGNOSIS — M25562 Pain in left knee: Secondary | ICD-10-CM

## 2015-01-13 DIAGNOSIS — N183 Chronic kidney disease, stage 3 (moderate): Secondary | ICD-10-CM

## 2015-01-13 DIAGNOSIS — M25552 Pain in left hip: Secondary | ICD-10-CM

## 2015-01-13 LAB — LIPID PANEL
CHOL/HDL RATIO: 2.8 ratio (ref <=5.0)
Cholesterol: 141 mg/dL (ref 125–200)
HDL: 51 mg/dL (ref >=46)
LDL CALC: 71 mg/dL (ref <130)
TRIGLYCERIDES: 95 mg/dL (ref <150)
VLDL: 19 mg/dL (ref <30)

## 2015-01-13 LAB — POCT CBC
Granulocyte percent: 77.6 %G (ref 37–80)
HCT, POC: 36.8 % — AB (ref 37.7–47.9)
Hemoglobin: 12.1 g/dL — AB (ref 12.2–16.2)
LYMPH, POC: 1.9 (ref 0.6–3.4)
MCH, POC: 26.8 pg — AB (ref 27–31.2)
MCHC: 32.9 g/dL (ref 31.8–35.4)
MCV: 81.6 fL (ref 80–97)
MID (CBC): 0.8 (ref 0–0.9)
MPV: 8.6 fL (ref 0–99.8)
POC GRANULOCYTE: 9.2 — AB (ref 2–6.9)
POC LYMPH PERCENT: 15.8 %L (ref 10–50)
POC MID %: 6.6 % (ref 0–12)
Platelet Count, POC: 249 10*3/uL (ref 142–424)
RBC: 4.51 M/uL (ref 4.04–5.48)
RDW, POC: 13.9 %
WBC: 11.8 10*3/uL — AB (ref 4.6–10.2)

## 2015-01-13 LAB — COMPLETE METABOLIC PANEL WITH GFR
ALBUMIN: 3.7 g/dL (ref 3.6–5.1)
ALK PHOS: 81 U/L (ref 33–130)
ALT: 13 U/L (ref 6–29)
AST: 11 U/L (ref 10–35)
BILIRUBIN TOTAL: 0.5 mg/dL (ref 0.2–1.2)
BUN: 16 mg/dL (ref 7–25)
CALCIUM: 9.3 mg/dL (ref 8.6–10.4)
CO2: 24 mmol/L (ref 20–31)
CREATININE: 1.81 mg/dL — AB (ref 0.50–0.99)
Chloride: 101 mmol/L (ref 98–110)
GFR, EST AFRICAN AMERICAN: 34 mL/min — AB (ref >=60)
GFR, Est Non African American: 30 mL/min — ABNORMAL LOW (ref >=60)
Glucose, Bld: 255 mg/dL — ABNORMAL HIGH (ref 65–99)
Potassium: 4.4 mmol/L (ref 3.5–5.3)
Sodium: 137 mmol/L (ref 135–146)
TOTAL PROTEIN: 7.2 g/dL (ref 6.1–8.1)

## 2015-01-13 LAB — POCT URINALYSIS DIP (MANUAL ENTRY)
BILIRUBIN UA: NEGATIVE
Glucose, UA: NEGATIVE
Nitrite, UA: POSITIVE — AB
PH UA: 6
Urobilinogen, UA: 0.2

## 2015-01-13 LAB — HIV ANTIBODY (ROUTINE TESTING W REFLEX): HIV 1&2 Ab, 4th Generation: NONREACTIVE

## 2015-01-13 LAB — POCT GLYCOSYLATED HEMOGLOBIN (HGB A1C): Hemoglobin A1C: 8.4

## 2015-01-13 LAB — HEPATITIS C ANTIBODY: HCV Ab: NEGATIVE

## 2015-01-13 LAB — HEMOGLOBIN A1C: HEMOGLOBIN A1C: 8.4 % — AB (ref 4.0–6.0)

## 2015-01-13 MED ORDER — LISINOPRIL-HYDROCHLOROTHIAZIDE 20-25 MG PO TABS: 1.0000 | ORAL_TABLET | Freq: Every day | ORAL | Status: DC

## 2015-01-13 MED ORDER — METFORMIN HCL ER 500 MG PO TB24: 500.0000 mg | ORAL_TABLET | Freq: Every day | ORAL | Status: DC

## 2015-01-13 MED ORDER — GLIPIZIDE 10 MG PO TABS: 15.0000 mg | ORAL_TABLET | Freq: Every day | ORAL | Status: DC

## 2015-01-13 MED ORDER — RANITIDINE HCL 150 MG PO TABS: 150.0000 mg | ORAL_TABLET | Freq: Two times a day (BID) | ORAL | Status: DC

## 2015-01-13 MED ORDER — GUAIFENESIN ER 1200 MG PO TB12: 1.0000 | ORAL_TABLET | Freq: Two times a day (BID) | ORAL | Status: AC

## 2015-01-13 MED ORDER — OXYCODONE-ACETAMINOPHEN 7.5-325 MG PO TABS: 1.0000 | ORAL_TABLET | ORAL | Status: DC | PRN

## 2015-01-13 MED ORDER — GABAPENTIN 300 MG PO CAPS: ORAL_CAPSULE | ORAL | Status: DC

## 2015-01-13 MED ORDER — ALBUTEROL SULFATE HFA 108 (90 BASE) MCG/ACT IN AERS: 2.0000 | INHALATION_SPRAY | Freq: Four times a day (QID) | RESPIRATORY_TRACT | Status: DC | PRN

## 2015-01-13 MED ORDER — LEVOFLOXACIN 500 MG PO TABS: 500.0000 mg | ORAL_TABLET | Freq: Every day | ORAL | Status: DC

## 2015-01-13 NOTE — Progress Notes (Signed)
Zoe Clarke  MRN: [id number] DOB: 11/19/1950  Subjective:  Pt presents to clinic with feeling sick last night.  She started getting nasal congestion with cough and flair of her asthma with wheezing and feeling like she cannot get a full deep breath.  She has been using albuterol which is helping with her symptoms of wheezing but not her cough - she used it 3x last night and 2x today.  She is also using Halls but that is not helping.  She is having some dysuria and urinary frequency and overall does not feel good.  She is not having any change in her back pain from her normal back pain. She saw pain management about 1 year ago and they wanted to do epidural injections and she declined due to her DM and she has not been back to pain management.  She is out of all her medications that she takes daily.  She is changing her care to [facility].  Sick contacts - none Does not work - she is disabled lives with her husband  Glucose - 285 this am - fasting -- typical in the am 100-150s  BP at home has also been high the last week - she takes her glucose daily and she takes her medications as instructed except that she ran out 4 days ago.  She does not know what her last labs were and she is not sure if she has had labs for months.  She has not gotten any vaccines from her current PCP.  She does not want her Zostavax because she states she has never gotten chicken pox even with being around many people who have had it in the past.  She does not want her flu vaccine because she has never gotten one and does not want to start.  Dr Sherral Hammers - opthalmologist - she has another cataract and she has plans to have surgery on that  Pt takes percocet daily - she states her husband gives it to her when she needs it so she is not sure how often she takes.  Her husband states she takes it bid.  Though of note at her PCP in August she had none in a urine drug screen.  Her last PCP provided all her medications.  Reviewed  their notes which included a pain management referral for the patient that is currently being worked on.  She has never seen a kidney MD nor an endocrinologist.  She states she needs refills of all her medications and she ran out 4 days ago.   Patient Active Problem List   Diagnosis Date Noted  . Knee pain, bilateral 01/13/2015  . Bilateral hip pain 11/23/2014  . Diabetes mellitus type 2, uncontrolled, with complications ([place]) [id number]  . Intertrigo 10/25/2014  . GERD (gastroesophageal reflux disease) 10/25/2014  . Diabetic neuropathy ([place]) 10/25/2014  . Chronic pain 10/25/2014  . Current tobacco use 11/11/2013  . Stage 3 chronic kidney disease due to type 2 diabetes mellitus ([place]) 09/02/2013  . Essential (primary) hypertension 09/02/2013  . Gout 12/05/2012    Current Outpatient Prescriptions on File Prior to Visit  Medication Sig Dispense Refill  . clotrimazole-betamethasone (LOTRISONE) cream Apply 1 application topically 2 (two) times daily. 30 g 0  . olopatadine (PATANOL) 0.1 % ophthalmic solution 1 drop 2 (two) times daily.    Marland Kitchen triamcinolone cream (KENALOG) 0.1 % Apply 1 application topically 2 (two) times daily.     No current facility-administered medications on file prior to visit.  No Known Allergies  Review of Systems  Constitutional: Positive for chills and appetite change (decrease over the last 24h). Negative for fever.  HENT: Positive for congestion, rhinorrhea (clear) and sinus pressure.   Respiratory: Positive for cough (mucus), chest tightness and wheezing (using albuterol). Negative for shortness of breath.        H/o asthma  Gastrointestinal: Positive for abdominal pain (suprapubic).  Genitourinary: Positive for dysuria and urgency. Negative for frequency, hematuria and vaginal discharge.  Allergic/Immunologic: Negative for environmental allergies.  Neurological: Positive for dizziness (with change in position).   Objective:  BP 150/96 mmHg  Pulse 89   Temp(Src) 100 F (37.8 C) (Oral)  Resp 18  Ht 5\' 8"  (1.727 m)  Wt 248 lb 3.2 oz (112.583 kg)  BMI 37.75 kg/m2  Physical Exam  Constitutional: She is oriented to person, place, and time and well-developed, well-nourished, and in no distress.  HENT:  Head: Normocephalic and atraumatic.  Right Ear: Hearing and external ear normal.  Left Ear: Hearing and external ear normal.  Eyes: Conjunctivae are normal.  Neck: Normal range of motion.  Cardiovascular: Normal rate, regular rhythm and normal heart sounds.   No murmur heard. Pulmonary/Chest: Effort normal and breath sounds normal. She has no wheezes.  Abdominal: Soft. There is tenderness in the suprapubic area. There is no CVA tenderness.  Neurological: She is alert and oriented to person, place, and time. Gait normal.  Walks with walker  Skin: Skin is warm and dry.  Psychiatric: Mood, memory, affect and judgment normal.  Vitals reviewed.   Results for orders placed or performed in visit on 01/13/15  POCT CBC  Result Value Ref Range   WBC 11.8 (A) 4.6 - 10.2 K/uL   Lymph, poc 1.9 0.6 - 3.4   POC LYMPH PERCENT 15.8 10 - 50 %L   MID (cbc) 0.8 0 - 0.9   POC MID % 6.6 0 - 12 %M   POC Granulocyte 9.2 (A) 2 - 6.9   Granulocyte percent 77.6 37 - 80 %G   RBC 4.51 4.04 - 5.48 M/uL   Hemoglobin 12.1 (A) 12.2 - 16.2 g/dL   HCT, POC 36.8 (A) 37.7 - 47.9 %   MCV 81.6 80 - 97 fL   MCH, POC 26.8 (A) 27 - 31.2 pg   MCHC 32.9 31.8 - 35.4 g/dL   RDW, POC 13.9 %   Platelet Count, POC 249.0 142 - 424 K/uL   MPV 8.6 0 - 99.8 fL  POCT glycosylated hemoglobin (Hb A1C)  Result Value Ref Range   Hemoglobin A1C 8.4   POCT urinalysis dipstick  Result Value Ref Range   Color, UA other (A) yellow   Clarity, UA hazy (A) clear   Glucose, UA negative negative   Bilirubin, UA small (A) negative   Ketones, POC UA negative negative   Spec Grav, UA >=1.030    Blood, UA large (A) negative   pH, UA 6.0    Protein Ur, POC =30 (A) negative    Urobilinogen, UA 0.2    Nitrite, UA Positive (A) Negative   Leukocytes, UA small (1+) (A) Negative    Assessment and Plan :  Knee pain, bilateral  Essential (primary) hypertension - Plan: lisinopril-hydrochlorothiazide (PRINZIDE,ZESTORETIC) 20-25 MG tablet - HTN is not controlled today but she has been without her medications for the last 4 days -   Type 2 diabetes mellitus with complication, without long-term current use of insulin (HCC) - Plan: POCT glycosylated hemoglobin (Hb A1C), Lipid  panel, COMPLETE METABOLIC PANEL WITH GFR, metFORMIN (GLUCOPHAGE XR) 500 MG 24 hr tablet, glipiZIDE (GLUCOTROL) 10 MG tablet, CANCELED: Microalbumin, urine - due to kidney disease pt is not a candidate for metformin or other oral DM medications.  She could be a candidate for trulicity or bydureon but I am concerned about whether she would be compliant.  I believe we will refer to endo due to her uncontrolled DM and her severe kidney disease.  I am not sure if she has any eye complications from her DM but she does see an ophhamologist.  Other diabetic neurological complication associated with type 2 diabetes mellitus ([place]) - Plan: gabapentin (NEURONTIN) 300 MG capsule -   Chronic pain - Plan: gabapentin (NEURONTIN) 300 MG capsule, oxyCODONE-acetaminophen (PERCOCET) 7.5-325 MG tablet, CANCELED: Prescription Abuse Monitoring, 10P - unable to do a urine drugs screen due to small amount of urine and patient could not stay to collect more because her [employer] transportation arrived and she had to leave. - We will continue to monitor her pain management referral as this is the correct place for her - We talked about epidural steroid injections and how she could have them even with her DMs.  Bilateral hip pain  Cough - Plan: POCT CBC, Guaifenesin (MUCINEX MAXIMUM STRENGTH) 1200 MG TB12 - used Levaquin to cover urine and lung symptoms -   Wheezing - Plan: albuterol (PROVENTIL HFA;VENTOLIN HFA) 108 (90 BASE) MCG/ACT inhaler-  continue albuterol use as needed for asthma - she states she never uses her albuterol unless she is sick  Dysuria - Plan: POCT urinalysis dipstick, Urine culture, levofloxacin (LEVAQUIN) 500 MG tablet, CANCELED: POCT Microscopic Urinalysis ([facility]) - unable to get a micro urine due to small urine amount but did send a culture.  Treated with levaquin to also cover lung symptoms.  Need for hepatitis C screening test - Plan: Hepatitis C antibody  Encounter for screening for HIV - Plan: HIV antibody  Need for pneumococcal vaccine - Plan: Pneumococcal conjugate vaccine 13-valent  Need for Tdap vaccination - Plan: Tdap vaccine greater than or equal to 7yo IM  Stage 3 chronic kidney disease due to type 2 diabetes mellitus ([place]) - needs a nephrologist for evaluation  Gastroesophageal reflux disease without esophagitis - Plan: ranitidine (ZANTAC) 150 MG tablet - continue current medications  Elevated WBC count - Plan: levofloxacin (LEVAQUIN) 500 MG tablet - used to treat UTI and possible bronchitis  Refilled patients medications today.   Recheck in 1 month.  Windell Hummingbird PA-C  [facility] 01/13/2015 9:00 PM

## 2015-01-14 ENCOUNTER — Encounter: Payer: Self-pay | Admitting: Family Medicine

## 2015-01-16 LAB — URINE CULTURE

## 2015-02-04 ENCOUNTER — Ambulatory Visit: Payer: Medicare HMO | Admitting: Physician Assistant

## 2015-02-25 ENCOUNTER — Encounter: Payer: Self-pay | Admitting: Physician Assistant

## 2015-02-25 ENCOUNTER — Ambulatory Visit (INDEPENDENT_AMBULATORY_CARE_PROVIDER_SITE_OTHER): Payer: PPO | Admitting: Physician Assistant

## 2015-02-25 VITALS — BP 140/90 | HR 69 | Temp 98.3°F | Resp 16 | Ht 67.0 in | Wt 245.0 lb

## 2015-02-25 DIAGNOSIS — E118 Type 2 diabetes mellitus with unspecified complications: Secondary | ICD-10-CM

## 2015-02-25 DIAGNOSIS — G8929 Other chronic pain: Secondary | ICD-10-CM | POA: Diagnosis not present

## 2015-02-25 DIAGNOSIS — K219 Gastro-esophageal reflux disease without esophagitis: Secondary | ICD-10-CM | POA: Diagnosis not present

## 2015-02-25 DIAGNOSIS — I10 Essential (primary) hypertension: Secondary | ICD-10-CM

## 2015-02-25 DIAGNOSIS — E1122 Type 2 diabetes mellitus with diabetic chronic kidney disease: Secondary | ICD-10-CM

## 2015-02-25 DIAGNOSIS — IMO0002 Reserved for concepts with insufficient information to code with codable children: Secondary | ICD-10-CM

## 2015-02-25 DIAGNOSIS — E1149 Type 2 diabetes mellitus with other diabetic neurological complication: Secondary | ICD-10-CM | POA: Diagnosis not present

## 2015-02-25 DIAGNOSIS — E1165 Type 2 diabetes mellitus with hyperglycemia: Secondary | ICD-10-CM | POA: Diagnosis not present

## 2015-02-25 DIAGNOSIS — Z1211 Encounter for screening for malignant neoplasm of colon: Secondary | ICD-10-CM

## 2015-02-25 DIAGNOSIS — N183 Chronic kidney disease, stage 3 (moderate): Secondary | ICD-10-CM | POA: Diagnosis not present

## 2015-02-25 DIAGNOSIS — R8290 Unspecified abnormal findings in urine: Secondary | ICD-10-CM

## 2015-02-25 DIAGNOSIS — M545 Low back pain, unspecified: Secondary | ICD-10-CM

## 2015-02-25 LAB — POCT URINALYSIS DIP (MANUAL ENTRY)
Bilirubin, UA: NEGATIVE
Glucose, UA: NEGATIVE
Ketones, POC UA: NEGATIVE
LEUKOCYTES UA: NEGATIVE
NITRITE UA: NEGATIVE
PH UA: 5.5
PROTEIN UA: NEGATIVE
Spec Grav, UA: 1.015
UROBILINOGEN UA: 0.2

## 2015-02-25 MED ORDER — LISINOPRIL-HYDROCHLOROTHIAZIDE 20-25 MG PO TABS: 1.0000 | ORAL_TABLET | Freq: Every day | ORAL | Status: DC

## 2015-02-25 MED ORDER — GLIPIZIDE 10 MG PO TABS: 15.0000 mg | ORAL_TABLET | Freq: Every day | ORAL | Status: DC

## 2015-02-25 MED ORDER — RANITIDINE HCL 150 MG PO TABS: 150.0000 mg | ORAL_TABLET | Freq: Two times a day (BID) | ORAL | Status: DC

## 2015-02-25 MED ORDER — GABAPENTIN 300 MG PO CAPS: 900.0000 mg | ORAL_CAPSULE | Freq: Three times a day (TID) | ORAL | Status: DC

## 2015-02-25 NOTE — Progress Notes (Signed)
Zoe Clarke  MRN: [id number] DOB: 1951/02/01  Subjective:  Pt presents to clinic for a recheck.  She has been well since her last visit.  Her urine is still strong smelling but her other symptoms have resolved.  She states she goes to the bathroom a lot.  She checks her sugars 2 times a day  Glucose at home     Fasting 109     Random - 120s She is having no problems with any of her medications. Her husband clips her toenails for her and lotions her feet She has found that the gabapentin works for her pain better than the percocet.  She does not want to go to pain management because they want to give her injections and she is "not having shots in her back".  Patient Active Problem List   Diagnosis Date Noted  . Knee pain, bilateral 01/13/2015  . Bilateral hip pain 11/23/2014  . Diabetes mellitus type 2, uncontrolled, with complications ([place]) [id number]  . Intertrigo 10/25/2014  . GERD (gastroesophageal reflux disease) 10/25/2014  . Diabetic neuropathy ([place]) 10/25/2014  . Chronic low back pain 10/25/2014  . Current tobacco use 11/11/2013  . Stage 3 chronic kidney disease due to type 2 diabetes mellitus ([place]) 09/02/2013  . Essential (primary) hypertension 09/02/2013  . Gout 12/05/2012    Current Outpatient Prescriptions on File Prior to Visit  Medication Sig Dispense Refill  . albuterol (PROVENTIL HFA;VENTOLIN HFA) 108 (90 BASE) MCG/ACT inhaler Inhale 2 puffs into the lungs every 6 (six) hours as needed for wheezing or shortness of breath. 1 Inhaler 0  . olopatadine (PATANOL) 0.1 % ophthalmic solution 1 drop 2 (two) times daily.    Marland Kitchen triamcinolone cream (KENALOG) 0.1 % Apply 1 application topically 2 (two) times daily.     No current facility-administered medications on file prior to visit.    No Known Allergies  Review of Systems  Constitutional: Negative for fever and chills.  Respiratory: Negative for shortness of breath.   Cardiovascular: Negative for chest pain,  palpitations and leg swelling.  Musculoskeletal: Positive for back pain and gait problem (no change for her).  Skin: Negative for wound.  Neurological: Negative for numbness.   Objective:  BP 140/90 mmHg  Pulse 69  Temp(Src) 98.3 F (36.8 C) (Oral)  Resp 16  Ht 5\' 7"  (1.702 m)  Wt 245 lb (111.131 kg)  BMI 38.36 kg/m2  SpO2 98%  Physical Exam  Constitutional: She is oriented to person, place, and time and well-developed, well-nourished, and in no distress.  HENT:  Head: Normocephalic and atraumatic.  Right Ear: Hearing and external ear normal.  Left Ear: Hearing and external ear normal.  Eyes: Conjunctivae are normal.  Neck: Normal range of motion.  Cardiovascular: Normal rate, regular rhythm and normal heart sounds.   No murmur heard. Pulmonary/Chest: Effort normal and breath sounds normal.  Musculoskeletal:       Right lower leg: She exhibits no edema.       Left lower leg: She exhibits no edema.  Neurological: She is alert and oriented to person, place, and time.  Walks with a walker.  Skin: Skin is warm and dry.  Diabetic Foot Exam - Simple   Simple Foot Form  Diabetic Foot exam was performed with the following findings:  Yes  02/25/2015  4:51 PM  Visual Inspection  No deformities, no ulcerations, no other skin breakdown bilaterally:  Yes  Sensation Testing  Intact to touch and monofilament testing bilaterally:  Yes  Pulse Check  Posterior Tibialis and Dorsalis pulse intact bilaterally:  Yes  Comments  Callus present bilaterally, pt unable to remove shoes and socks because of decreased mobility.  Psychiatric: Mood, memory, affect and judgment normal.  Vitals reviewed.  Results for orders placed or performed in visit on 02/25/15  POCT urinalysis dipstick  Result Value Ref Range   Color, UA yellow yellow   Clarity, UA clear clear   Glucose, UA negative negative   Bilirubin, UA negative negative   Ketones, POC UA negative negative   Spec Grav, UA 1.015     Blood, UA trace-lysed (A) negative   pH, UA 5.5    Protein Ur, POC negative negative   Urobilinogen, UA 0.2    Nitrite, UA Negative Negative   Leukocytes, UA Negative Negative    Assessment and Plan :  Abnormal urine odor - Plan: POCT urinalysis dipstick, Urine culture  Essential (primary) hypertension - Plan: lisinopril-hydrochlorothiazide (PRINZIDE,ZESTORETIC) 20-25 MG tablet - better upon recheck   Uncontrolled type 2 diabetes mellitus with complication, without long-term current use of insulin (HCC) - Plan: Microalbumin, urine, Ambulatory referral to Endocrinology - due to her kidney function and her uncontrolled DM I suspect that she needs insulin  Stage 3 chronic kidney disease due to type 2 diabetes mellitus ([place]) - Plan: Ambulatory referral to Nephrology -   Other diabetic neurological complication associated with type 2 diabetes mellitus ([place]) - Plan: gabapentin (NEURONTIN) 300 MG capsule  Chronic pain - Plan: gabapentin (NEURONTIN) 300 MG capsule - I am unable to continue her Percocet and she does not want to go to pain management - she really feels like the gabapentin helps much more with the pain - she has increased her dose at times and she gets pain relief - she has taken up to 3 pills and that really helps her pain so we will increase that dose to 900mg  tid - we discussed that this can be further increased if need be.  She might also benefit from something like cymbalta but I will reserve that for the future  Type 2 diabetes mellitus with complication, without long-term current use of insulin (HCC) - Plan: glipiZIDE (GLUCOTROL) 10 MG tablet - continue current mediations but I am sending her to endocrinology because I think she needs to be on insulin at this point in her disease process.  Gastroesophageal reflux disease without esophagitis - Plan: ranitidine (ZANTAC) 150 MG tablet - continue current dose  Screening for colon cancer - Plan: Ambulatory referral to  Gastroenterology  Chronic low back pain -   Recheck in 3 months  Windell Hummingbird PA-C  [facility] 02/25/2015 5:01 PM

## 2015-02-25 NOTE — Patient Instructions (Signed)
We will send you to a few specialist Diabetes specialist - to start on insulin Kidney specialist - to watch your kidney function  We increased your medication for your pain

## 2015-02-26 LAB — URINE CULTURE
COLONY COUNT: NO GROWTH
Organism ID, Bacteria: NO GROWTH

## 2015-02-26 LAB — MICROALBUMIN, URINE: MICROALB UR: 7.5 mg/dL

## 2015-04-16 ENCOUNTER — Other Ambulatory Visit: Payer: Self-pay | Admitting: Physician Assistant

## 2015-05-30 ENCOUNTER — Other Ambulatory Visit: Payer: Self-pay | Admitting: Physician Assistant

## 2015-06-09 ENCOUNTER — Ambulatory Visit: Payer: PPO | Admitting: Physician Assistant

## 2015-06-25 ENCOUNTER — Other Ambulatory Visit: Payer: Self-pay | Admitting: Physician Assistant

## 2015-07-22 DIAGNOSIS — E1140 Type 2 diabetes mellitus with diabetic neuropathy, unspecified: Secondary | ICD-10-CM | POA: Diagnosis not present

## 2015-07-22 DIAGNOSIS — E1122 Type 2 diabetes mellitus with diabetic chronic kidney disease: Secondary | ICD-10-CM | POA: Diagnosis not present

## 2015-07-22 DIAGNOSIS — F17200 Nicotine dependence, unspecified, uncomplicated: Secondary | ICD-10-CM | POA: Diagnosis not present

## 2015-07-22 DIAGNOSIS — M109 Gout, unspecified: Secondary | ICD-10-CM | POA: Diagnosis not present

## 2015-07-22 DIAGNOSIS — N183 Chronic kidney disease, stage 3 (moderate): Secondary | ICD-10-CM | POA: Diagnosis not present

## 2015-07-22 DIAGNOSIS — D631 Anemia in chronic kidney disease: Secondary | ICD-10-CM | POA: Diagnosis not present

## 2015-07-22 DIAGNOSIS — N2581 Secondary hyperparathyroidism of renal origin: Secondary | ICD-10-CM | POA: Diagnosis not present

## 2015-07-22 DIAGNOSIS — M1990 Unspecified osteoarthritis, unspecified site: Secondary | ICD-10-CM | POA: Diagnosis not present

## 2015-07-22 DIAGNOSIS — K219 Gastro-esophageal reflux disease without esophagitis: Secondary | ICD-10-CM | POA: Diagnosis not present

## 2015-07-22 DIAGNOSIS — E669 Obesity, unspecified: Secondary | ICD-10-CM | POA: Diagnosis not present

## 2015-07-22 DIAGNOSIS — I129 Hypertensive chronic kidney disease with stage 1 through stage 4 chronic kidney disease, or unspecified chronic kidney disease: Secondary | ICD-10-CM | POA: Diagnosis not present

## 2015-07-22 DIAGNOSIS — E785 Hyperlipidemia, unspecified: Secondary | ICD-10-CM | POA: Diagnosis not present

## 2015-07-25 DIAGNOSIS — N183 Chronic kidney disease, stage 3 (moderate): Secondary | ICD-10-CM | POA: Diagnosis not present

## 2015-08-04 ENCOUNTER — Other Ambulatory Visit: Payer: Self-pay | Admitting: Nephrology

## 2015-08-04 DIAGNOSIS — N183 Chronic kidney disease, stage 3 (moderate): Secondary | ICD-10-CM

## 2015-08-05 ENCOUNTER — Other Ambulatory Visit: Payer: Medicare HMO

## 2015-08-22 ENCOUNTER — Ambulatory Visit
Admission: RE | Admit: 2015-08-22 | Discharge: 2015-08-22 | Disposition: A | Discharge status: Home / Self Care | Payer: PPO | Source: Ambulatory Visit | Attending: Nephrology | Admitting: Nephrology

## 2015-08-22 DIAGNOSIS — N189 Chronic kidney disease, unspecified: Secondary | ICD-10-CM | POA: Diagnosis not present

## 2015-08-22 DIAGNOSIS — N183 Chronic kidney disease, stage 3 (moderate): Secondary | ICD-10-CM

## 2015-08-25 ENCOUNTER — Encounter: Payer: Self-pay | Admitting: Physician Assistant

## 2015-08-25 ENCOUNTER — Ambulatory Visit (INDEPENDENT_AMBULATORY_CARE_PROVIDER_SITE_OTHER): Payer: PPO | Admitting: Physician Assistant

## 2015-08-25 VITALS — BP 140/100 | HR 105 | Temp 99.7°F | Resp 16 | Ht 66.0 in | Wt 251.4 lb

## 2015-08-25 DIAGNOSIS — K219 Gastro-esophageal reflux disease without esophagitis: Secondary | ICD-10-CM

## 2015-08-25 DIAGNOSIS — Z1211 Encounter for screening for malignant neoplasm of colon: Secondary | ICD-10-CM

## 2015-08-25 DIAGNOSIS — E118 Type 2 diabetes mellitus with unspecified complications: Secondary | ICD-10-CM | POA: Diagnosis not present

## 2015-08-25 DIAGNOSIS — E1165 Type 2 diabetes mellitus with hyperglycemia: Secondary | ICD-10-CM | POA: Diagnosis not present

## 2015-08-25 DIAGNOSIS — I10 Essential (primary) hypertension: Secondary | ICD-10-CM

## 2015-08-25 DIAGNOSIS — L309 Dermatitis, unspecified: Secondary | ICD-10-CM | POA: Diagnosis not present

## 2015-08-25 DIAGNOSIS — N183 Chronic kidney disease, stage 3 unspecified: Secondary | ICD-10-CM

## 2015-08-25 DIAGNOSIS — E1122 Type 2 diabetes mellitus with diabetic chronic kidney disease: Secondary | ICD-10-CM

## 2015-08-25 DIAGNOSIS — E1149 Type 2 diabetes mellitus with other diabetic neurological complication: Secondary | ICD-10-CM

## 2015-08-25 DIAGNOSIS — M62838 Other muscle spasm: Secondary | ICD-10-CM | POA: Diagnosis not present

## 2015-08-25 DIAGNOSIS — IMO0002 Reserved for concepts with insufficient information to code with codable children: Secondary | ICD-10-CM

## 2015-08-25 MED ORDER — CYCLOBENZAPRINE HCL 5 MG PO TABS: 5.0000 mg | ORAL_TABLET | Freq: Every day | ORAL | Status: DC

## 2015-08-25 MED ORDER — HYDROCHLOROTHIAZIDE 25 MG PO TABS: 25.0000 mg | ORAL_TABLET | Freq: Every day | ORAL | Status: DC

## 2015-08-25 MED ORDER — GABAPENTIN 300 MG PO CAPS: 900.0000 mg | ORAL_CAPSULE | Freq: Three times a day (TID) | ORAL | Status: DC

## 2015-08-25 MED ORDER — LISINOPRIL 20 MG PO TABS: 20.0000 mg | ORAL_TABLET | Freq: Every day | ORAL | Status: DC

## 2015-08-25 MED ORDER — TRIAMCINOLONE ACETONIDE 0.1 % EX CREA: 1.0000 "application " | TOPICAL_CREAM | Freq: Two times a day (BID) | CUTANEOUS | Status: DC

## 2015-08-25 NOTE — Patient Instructions (Signed)
     IF you received an x-ray today, you will receive an invoice from [facility]. Please contact [facility] at [phone] with questions or concerns regarding your invoice.   IF you received labwork today, you will receive an invoice from [facility]. Please contact [facility] at [phone] with questions or concerns regarding your invoice.   Our billing staff will not be able to assist you with questions regarding bills from these companies.  You will be contacted with the lab results as soon as they are available. The fastest way to get your results is to activate your My Chart account. Instructions are located on the last page of this paperwork. If you have not heard from us regarding the results in 2 weeks, please contact this office.      

## 2015-08-25 NOTE — Progress Notes (Signed)
Zoe Clarke  MRN: [id number] DOB: 1950-04-25  Subjective:  Pt presents to clinic for medication refill and recheck.  She needs her SCAT application filled out.  She is on a walker and cannot walk for long distances due to knee pain and back pain as well as her DM neuropathy.  She is on medications for all of these.    She saw the kidney specialist recently and he wanted her to go on allopurinol but she does not want to take it - she is not sure what it is for but she does have gout history.  BP at home - 138/80 - she is unsure why her BP is so elevated today - she would like the medication split because sometimes it makes her feel bad and when she takes separately she does not feel bad.  She has muscle spasms and has tried her husbands muscle relaxer and it really helps with the night time spasms.    Patient Active Problem List   Diagnosis Date Noted  . Knee pain, bilateral 01/13/2015  . Bilateral hip pain 11/23/2014  . Diabetes mellitus type 2, uncontrolled, with complications ([place]) [id number]  . Intertrigo 10/25/2014  . GERD (gastroesophageal reflux disease) 10/25/2014  . Diabetic neuropathy ([place]) 10/25/2014  . Chronic low back pain 10/25/2014  . Current tobacco use 11/11/2013  . Stage 3 chronic kidney disease due to type 2 diabetes mellitus ([place]) 09/02/2013  . Essential (primary) hypertension 09/02/2013  . Gout 12/05/2012    Current Outpatient Prescriptions on File Prior to Visit  Medication Sig Dispense Refill  . albuterol (PROVENTIL HFA;VENTOLIN HFA) 108 (90 BASE) MCG/ACT inhaler Inhale 2 puffs into the lungs every 6 (six) hours as needed for wheezing or shortness of breath. 1 Inhaler 0  . glipiZIDE (GLUCOTROL) 10 MG tablet TAKE 1 AND 1/2 TABLET BY MOUTH BEFORE BREAKFAST 45 tablet 0  . olopatadine (PATANOL) 0.1 % ophthalmic solution 1 drop 2 (two) times daily.    . ranitidine (ZANTAC) 150 MG tablet TAKE 1 TABLET (150 MG TOTAL) BY MOUTH 2 (TWO) TIMES DAILY. 180 tablet 0    No current facility-administered medications on file prior to visit.    No Known Allergies  Review of Systems  Constitutional: Negative for fever and chills.  Respiratory: Negative for shortness of breath.   Cardiovascular: Negative for chest pain, palpitations and leg swelling.  Skin: Negative for wound.  Neurological: Negative for numbness.   Objective:  BP 140/100 mmHg  Pulse 105  Temp(Src) 99.7 F (37.6 C) (Oral)  Resp 16  Ht 5\' 6"  (1.676 m)  Wt 251 lb 6.4 oz (114.034 kg)  BMI 40.60 kg/m2  SpO2 98%  Physical Exam  Constitutional: She is oriented to person, place, and time and well-developed, well-nourished, and in no distress.  HENT:  Head: Normocephalic and atraumatic.  Right Ear: Hearing and external ear normal.  Left Ear: Hearing and external ear normal.  Eyes: Conjunctivae are normal.  Neck: Normal range of motion.  Cardiovascular: Normal rate, regular rhythm and normal heart sounds.   No murmur heard. Pulmonary/Chest: Effort normal and breath sounds normal.  Musculoskeletal:       Right lower leg: She exhibits no edema.       Left lower leg: She exhibits no edema.  Neurological: She is alert and oriented to person, place, and time. Gait normal.  Skin: Skin is warm and dry.  Pt refused foot exam but it was done 6 months ago  Psychiatric: Mood, memory, affect and  judgment normal.  Vitals reviewed.   Assessment and Plan :  Essential (primary) hypertension - Plan: COMPLETE METABOLIC PANEL WITH GFR, lisinopril (PRINIVIL,ZESTRIL) 20 MG tablet, hydrochlorothiazide (HYDRODIURIL) 25 MG tablet - elevated today - she will continue to monitor at home  Gastroesophageal reflux disease without esophagitis - she is good with her medications at this time  Uncontrolled type 2 diabetes mellitus with complication, without long-term current use of insulin (HCC) - Plan: Lipid panel, Hemoglobin A1c - pt has only been taking 1 glucotrol a day because she cannot break in half -  no refills were given today to determine what her A1C is and what medications and dosing are needed  Other diabetic neurological complication associated with type 2 diabetes mellitus ([place]) - Plan: gabapentin (NEURONTIN) 300 MG capsule - continue current medications  Stage 3 chronic kidney disease due to type 2 diabetes mellitus ([place]) - d/w them the allopurinol  Eczema - Plan: triamcinolone cream (KENALOG) 0.1 % - use as needed  Muscle spasm - Plan: cyclobenzaprine (FLEXERIL) 5 MG tablet- will try this at night -   Screen for colon cancer - Plan: Ambulatory referral to Gastroenterology  Recheck in 3 months  Windell Hummingbird PA-C  [facility] 08/25/2015 4:20 PM

## 2015-08-26 LAB — COMPLETE METABOLIC PANEL WITH GFR
ALBUMIN: 3.7 g/dL (ref 3.6–5.1)
ALK PHOS: 93 U/L (ref 33–130)
ALT: 10 U/L (ref 6–29)
AST: 11 U/L (ref 10–35)
BUN: 24 mg/dL (ref 7–25)
CO2: 21 mmol/L (ref 20–31)
Calcium: 9.1 mg/dL (ref 8.6–10.4)
Chloride: 109 mmol/L (ref 98–110)
Creat: 1.81 mg/dL — ABNORMAL HIGH (ref 0.50–0.99)
GFR, EST NON AFRICAN AMERICAN: 29 mL/min — AB (ref >=60)
GFR, Est African American: 34 mL/min — ABNORMAL LOW (ref >=60)
GLUCOSE: 173 mg/dL — AB (ref 65–99)
POTASSIUM: 4.9 mmol/L (ref 3.5–5.3)
SODIUM: 139 mmol/L (ref 135–146)
Total Bilirubin: 0.3 mg/dL (ref 0.2–1.2)
Total Protein: 7.1 g/dL (ref 6.1–8.1)

## 2015-08-26 LAB — LIPID PANEL
CHOL/HDL RATIO: 3.2 ratio (ref <=5.0)
Cholesterol: 149 mg/dL (ref 125–200)
HDL: 47 mg/dL (ref >=46)
LDL CALC: 84 mg/dL (ref <130)
Triglycerides: 90 mg/dL (ref <150)
VLDL: 18 mg/dL (ref <30)

## 2015-08-26 LAB — HEMOGLOBIN A1C
HEMOGLOBIN A1C: 8.7 % — AB (ref <5.7)
MEAN PLASMA GLUCOSE: 203 mg/dL

## 2015-08-27 ENCOUNTER — Encounter: Payer: Self-pay | Admitting: Physician Assistant

## 2015-08-27 MED ORDER — GLIPIZIDE 10 MG PO TABS: 10.0000 mg | ORAL_TABLET | Freq: Two times a day (BID) | ORAL | Status: DC

## 2015-08-29 ENCOUNTER — Encounter: Payer: Self-pay | Admitting: Gastroenterology

## 2015-09-22 ENCOUNTER — Telehealth: Payer: Self-pay

## 2015-09-22 NOTE — Telephone Encounter (Signed)
Pt is needing to talk with someone about the miligrams for [place]ab[place] should be 800 mg not 500 and glipizide is not being able to be filled and pt was told that she will get 90 day supply of all meds but she doesn't have it for all [facility] [address]  Best number [phone]

## 2015-09-23 DIAGNOSIS — E1122 Type 2 diabetes mellitus with diabetic chronic kidney disease: Secondary | ICD-10-CM | POA: Diagnosis not present

## 2015-09-23 DIAGNOSIS — E669 Obesity, unspecified: Secondary | ICD-10-CM | POA: Diagnosis not present

## 2015-09-23 DIAGNOSIS — N183 Chronic kidney disease, stage 3 (moderate): Secondary | ICD-10-CM | POA: Diagnosis not present

## 2015-09-23 DIAGNOSIS — N2581 Secondary hyperparathyroidism of renal origin: Secondary | ICD-10-CM | POA: Diagnosis not present

## 2015-09-23 DIAGNOSIS — M109 Gout, unspecified: Secondary | ICD-10-CM | POA: Diagnosis not present

## 2015-09-23 DIAGNOSIS — M1990 Unspecified osteoarthritis, unspecified site: Secondary | ICD-10-CM | POA: Diagnosis not present

## 2015-09-23 DIAGNOSIS — F17200 Nicotine dependence, unspecified, uncomplicated: Secondary | ICD-10-CM | POA: Diagnosis not present

## 2015-09-23 DIAGNOSIS — D631 Anemia in chronic kidney disease: Secondary | ICD-10-CM | POA: Diagnosis not present

## 2015-09-23 DIAGNOSIS — M545 Low back pain: Secondary | ICD-10-CM | POA: Diagnosis not present

## 2015-09-23 DIAGNOSIS — K219 Gastro-esophageal reflux disease without esophagitis: Secondary | ICD-10-CM | POA: Diagnosis not present

## 2015-09-23 DIAGNOSIS — I129 Hypertensive chronic kidney disease with stage 1 through stage 4 chronic kidney disease, or unspecified chronic kidney disease: Secondary | ICD-10-CM | POA: Diagnosis not present

## 2015-09-23 DIAGNOSIS — E785 Hyperlipidemia, unspecified: Secondary | ICD-10-CM | POA: Diagnosis not present

## 2015-09-23 NOTE — Telephone Encounter (Signed)
Called pt, no answer.

## 2015-09-26 NOTE — Telephone Encounter (Signed)
Pt states the medications were sent in correctly on Saturday.

## 2015-10-12 ENCOUNTER — Encounter: Payer: Self-pay | Admitting: Gastroenterology

## 2015-10-24 ENCOUNTER — Encounter: Payer: Self-pay | Admitting: Gastroenterology

## 2015-10-24 ENCOUNTER — Ambulatory Visit (AMBULATORY_SURGERY_CENTER): Payer: Self-pay

## 2015-10-24 VITALS — Ht 68.0 in | Wt 246.4 lb

## 2015-10-24 DIAGNOSIS — N183 Chronic kidney disease, stage 3 (moderate): Secondary | ICD-10-CM | POA: Diagnosis not present

## 2015-10-24 DIAGNOSIS — Z1211 Encounter for screening for malignant neoplasm of colon: Secondary | ICD-10-CM

## 2015-10-24 MED ORDER — SUPREP BOWEL PREP KIT 17.5-3.13-1.6 GM/177ML PO SOLN: 1.0000 | Freq: Once | ORAL | 0 refills | Status: AC

## 2015-10-24 NOTE — Progress Notes (Signed)
No allergies to eggs or soy No home oxygen No diet meds No past problems with anesthesia  Uses walker to ambulate  No internet

## 2015-10-31 ENCOUNTER — Telehealth: Payer: Self-pay | Admitting: Gastroenterology

## 2015-10-31 NOTE — Telephone Encounter (Signed)
The pt was notified that a free sample Suprep was left at the front desk for pick up

## 2015-11-02 ENCOUNTER — Ambulatory Visit (AMBULATORY_SURGERY_CENTER): Payer: PPO | Admitting: Gastroenterology

## 2015-11-02 ENCOUNTER — Encounter: Payer: Self-pay | Admitting: Gastroenterology

## 2015-11-02 VITALS — BP 130/69 | HR 111 | Temp 95.5°F | Resp 16 | Ht 68.0 in | Wt 241.0 lb

## 2015-11-02 DIAGNOSIS — D125 Benign neoplasm of sigmoid colon: Secondary | ICD-10-CM

## 2015-11-02 DIAGNOSIS — Z1211 Encounter for screening for malignant neoplasm of colon: Secondary | ICD-10-CM

## 2015-11-02 DIAGNOSIS — D124 Benign neoplasm of descending colon: Secondary | ICD-10-CM

## 2015-11-02 DIAGNOSIS — E669 Obesity, unspecified: Secondary | ICD-10-CM | POA: Diagnosis not present

## 2015-11-02 DIAGNOSIS — E119 Type 2 diabetes mellitus without complications: Secondary | ICD-10-CM | POA: Diagnosis not present

## 2015-11-02 DIAGNOSIS — I10 Essential (primary) hypertension: Secondary | ICD-10-CM | POA: Diagnosis not present

## 2015-11-02 LAB — GLUCOSE, CAPILLARY
Glucose-Capillary: 147 mg/dL — ABNORMAL HIGH (ref 65–99)
Glucose-Capillary: 139 mg/dL — ABNORMAL HIGH (ref 65–99)

## 2015-11-02 MED ORDER — SODIUM CHLORIDE 0.9 % IV SOLN: 500.0000 mL | INTRAVENOUS | Status: DC

## 2015-11-02 NOTE — Op Note (Signed)
[facility] Patient Name: Zoe Clarke Procedure Date: 11/02/2015 9:08 AM MRN: [id number] Endoscopist: Milus Banister , MD Age: 65 Referring MD:  Date of Birth: [birth year] Gender: Female Account #: [id number] Procedure:                Colonoscopy Indications:              Screening for colorectal malignant neoplasm Medicines:                Monitored Anesthesia Care Procedure:                Pre-Anesthesia Assessment:                           - Prior to the procedure, a History and Physical                            was performed, and patient medications and                            allergies were reviewed. The patient's tolerance of                            previous anesthesia was also reviewed. The risks                            and benefits of the procedure and the sedation                            options and risks were discussed with the patient.                            All questions were answered, and informed consent                            was obtained. Prior Anticoagulants: The patient has                            taken no previous anticoagulant or antiplatelet                            agents. ASA Grade Assessment: II - A patient with                            mild systemic disease. After reviewing the risks                            and benefits, the patient was deemed in                            satisfactory condition to undergo the procedure.                           After obtaining informed consent, the colonoscope  was passed under direct vision. Throughout the                            procedure, the patient's blood pressure, pulse, and                            oxygen saturations were monitored continuously. The                            Model PCF-[id number]) scope was introduced                            through the anus and advanced to the the cecum,                            identified by  appendiceal orifice and ileocecal                            valve. The colonoscopy was performed without                            difficulty. The patient tolerated the procedure                            well. The quality of the bowel preparation was                            excellent. The ileocecal valve, appendiceal                            orifice, and rectum were photographed. Scope In: 9:15:18 AM Scope Out: 9:26:18 AM Scope Withdrawal Time: 0 hours 9 minutes 7 seconds  Total Procedure Duration: 0 hours 11 minutes 0 seconds  Findings:                 A 11 mm polyp was found in the sigmoid colon. The                            polyp was pedunculated. The polyp was removed with                            a hot snare. Resection and retrieval were complete.                           Two semi-pedunculated polyps were found in the                            descending colon. The polyps were 4 to 7 mm in                            size. These polyps were removed with a cold snare.  Resection and retrieval were complete.                           Multiple small-mouthed diverticula were found in                            the left colon.                           The exam was otherwise without abnormality on                            direct and retroflexion views. Complications:            No immediate complications. Estimated blood loss:                            None. Estimated Blood Loss:     Estimated blood loss: none. Impression:               - One 11 mm polyp in the sigmoid colon, removed                            with a hot snare. Resected and retrieved.                           - Two 4 to 7 mm polyps in the descending colon,                            removed with a cold snare. Resected and retrieved.                           - Diverticulosis in the left colon.                           - The examination was otherwise normal on direct                             and retroflexion views. Recommendation:           - Patient has a contact number available for                            emergencies. The signs and symptoms of potential                            delayed complications were discussed with the                            patient. Return to normal activities tomorrow.                            Written discharge instructions were provided to the                            patient.                           -  Resume previous diet.                           - Continue present medications.                           You will receive a letter within 2-3 weeks with the                            pathology results and my final recommendations.                           If the polyp(s) is proven to be 'pre-cancerous' on                            pathology, you will need repeat colonoscopy in 3                            years. If the polyp(s) is NOT 'precancerous' on                            pathology then you should repeat colon cancer                            screening in 10 years with colonoscopy without need                            for colon cancer screening by any method prior to                            then (including stool testing). Milus Banister, MD 11/02/2015 9:31:18 AM This report has been signed electronically.

## 2015-11-02 NOTE — Progress Notes (Signed)
PATIENT CONTINUES TO WAIT ON SCAT TRANSPORTATION. CALLS PLACED TO SCAT X 3.

## 2015-11-02 NOTE — Progress Notes (Signed)
To recovery, report to Tyrell, RN, VSS. 

## 2015-11-02 NOTE — Patient Instructions (Signed)
YOU HAD AN ENDOSCOPIC PROCEDURE TODAY AT THE [facility]:   Refer to the procedure report that was given to you for any specific questions about what was found during the examination.  If the procedure report does not answer your questions, please call your gastroenterologist to clarify.  If you requested that your care partner not be given the details of your procedure findings, then the procedure report has been included in a sealed envelope for you to review at your convenience later.  YOU SHOULD EXPECT: Some feelings of bloating in the abdomen. Passage of more gas than usual.  Walking can help get rid of the air that was put into your GI tract during the procedure and reduce the bloating. If you had a lower endoscopy (such as a colonoscopy or flexible sigmoidoscopy) you may notice spotting of blood in your stool or on the toilet paper. If you underwent a bowel prep for your procedure, you may not have a normal bowel movement for a few days.  Please Note:  You might notice some irritation and congestion in your nose or some drainage.  This is from the oxygen used during your procedure.  There is no need for concern and it should clear up in a day or so.  SYMPTOMS TO REPORT IMMEDIATELY:   Following lower endoscopy (colonoscopy or flexible sigmoidoscopy):  Excessive amounts of blood in the stool  Significant tenderness or worsening of abdominal pains  Swelling of the abdomen that is new, acute  Fever of 100F or higher  For urgent or emergent issues, a gastroenterologist can be reached at any hour by calling [phone].   DIET:  We do recommend a small meal at first, but then you may proceed to your regular diet.  Drink plenty of fluids but you should avoid alcoholic beverages for 24 hours.  ACTIVITY:  You should plan to take it easy for the rest of today and you should NOT DRIVE or use heavy machinery until tomorrow (because of the sedation medicines used during the test).     FOLLOW UP: Our staff will call the number listed on your records the next business day following your procedure to check on you and address any questions or concerns that you may have regarding the information given to you following your procedure. If we do not reach you, we will leave a message.  However, if you are feeling well and you are not experiencing any problems, there is no need to return our call.  We will assume that you have returned to your regular daily activities without incident.  If any biopsies were taken you will be contacted by phone or by letter within the next 1-3 weeks.  Please call us at [phone] if you have not heard about the biopsies in 3 weeks.    SIGNATURES/CONFIDENTIALITY: You and/or your care partner have signed paperwork which will be entered into your electronic medical record.  These signatures attest to the fact that that the information above on your After Visit Summary has been reviewed and is understood.  Full responsibility of the confidentiality of this discharge information lies with you and/or your care-partner. 

## 2015-11-03 ENCOUNTER — Encounter (HOSPITAL_COMMUNITY): Payer: Self-pay | Admitting: *Deleted

## 2015-11-03 ENCOUNTER — Emergency Department (HOSPITAL_COMMUNITY)
Admission: EM | Admit: 2015-11-03 | Discharge: 2015-11-03 | Disposition: A | Discharge status: Home / Self Care | Payer: PPO | Attending: Emergency Medicine | Admitting: Emergency Medicine

## 2015-11-03 ENCOUNTER — Emergency Department (HOSPITAL_COMMUNITY): Payer: PPO

## 2015-11-03 ENCOUNTER — Telehealth: Payer: Self-pay

## 2015-11-03 DIAGNOSIS — J45909 Unspecified asthma, uncomplicated: Secondary | ICD-10-CM | POA: Insufficient documentation

## 2015-11-03 DIAGNOSIS — Y939 Activity, unspecified: Secondary | ICD-10-CM | POA: Diagnosis not present

## 2015-11-03 DIAGNOSIS — I129 Hypertensive chronic kidney disease with stage 1 through stage 4 chronic kidney disease, or unspecified chronic kidney disease: Secondary | ICD-10-CM | POA: Insufficient documentation

## 2015-11-03 DIAGNOSIS — F17210 Nicotine dependence, cigarettes, uncomplicated: Secondary | ICD-10-CM | POA: Diagnosis not present

## 2015-11-03 DIAGNOSIS — W06XXXA Fall from bed, initial encounter: Secondary | ICD-10-CM | POA: Insufficient documentation

## 2015-11-03 DIAGNOSIS — F1290 Cannabis use, unspecified, uncomplicated: Secondary | ICD-10-CM | POA: Insufficient documentation

## 2015-11-03 DIAGNOSIS — Y999 Unspecified external cause status: Secondary | ICD-10-CM | POA: Insufficient documentation

## 2015-11-03 DIAGNOSIS — Z7984 Long term (current) use of oral hypoglycemic drugs: Secondary | ICD-10-CM | POA: Insufficient documentation

## 2015-11-03 DIAGNOSIS — Z79899 Other long term (current) drug therapy: Secondary | ICD-10-CM | POA: Insufficient documentation

## 2015-11-03 DIAGNOSIS — S01512A Laceration without foreign body of oral cavity, initial encounter: Secondary | ICD-10-CM | POA: Diagnosis not present

## 2015-11-03 DIAGNOSIS — S0181XA Laceration without foreign body of other part of head, initial encounter: Secondary | ICD-10-CM | POA: Diagnosis not present

## 2015-11-03 DIAGNOSIS — E1122 Type 2 diabetes mellitus with diabetic chronic kidney disease: Secondary | ICD-10-CM | POA: Insufficient documentation

## 2015-11-03 DIAGNOSIS — Y929 Unspecified place or not applicable: Secondary | ICD-10-CM | POA: Insufficient documentation

## 2015-11-03 DIAGNOSIS — S00531A Contusion of lip, initial encounter: Secondary | ICD-10-CM | POA: Diagnosis not present

## 2015-11-03 DIAGNOSIS — S0990XA Unspecified injury of head, initial encounter: Secondary | ICD-10-CM | POA: Diagnosis not present

## 2015-11-03 DIAGNOSIS — N183 Chronic kidney disease, stage 3 (moderate): Secondary | ICD-10-CM | POA: Insufficient documentation

## 2015-11-03 DIAGNOSIS — S0081XA Abrasion of other part of head, initial encounter: Secondary | ICD-10-CM

## 2015-11-03 DIAGNOSIS — S0083XA Contusion of other part of head, initial encounter: Secondary | ICD-10-CM

## 2015-11-03 NOTE — ED Provider Notes (Signed)
[facility] DEPT Provider Note   CSN: [id number] Arrival date & time: 11/03/15  0143     History   Chief Complaint Chief Complaint  Patient presents with  . Fall    HPI Zoe Clarke is a 65 y.o. female who underwent a colonoscopy yesterday under procedural sedation. She took her usual Flexeril and gabapentin before going to bed. She thinks the combination may have oversedated her. She fell out of bed about 12:30 this morning striking her right forehead on a heater. There is swelling and a small wound there. Associated pain is mild and worse with palpation. She denies neck pain. She has not been vomiting. She also struck her upper lip and is having pain associated with the right upper medial incisor. She is not aware of any loose teeth.  She states her last tetanus booster was in 2016.  HPI  Past Medical History:  Diagnosis Date  . Allergy   . Arthritis   . Asthma   . Diabetes mellitus without complication ([place])   . Gastric ulcer   . GERD (gastroesophageal reflux disease)   . History of blood transfusion 1972  . Hypertension   . Osteoporosis     Patient Active Problem List   Diagnosis Date Noted  . Knee pain, bilateral 01/13/2015  . Bilateral hip pain 11/23/2014  . Diabetes mellitus type 2, uncontrolled, with complications ([place]) [id number]  . Intertrigo 10/25/2014  . GERD (gastroesophageal reflux disease) 10/25/2014  . Diabetic neuropathy ([place]) 10/25/2014  . Chronic low back pain 10/25/2014  . Current tobacco use 11/11/2013  . Stage 3 chronic kidney disease due to type 2 diabetes mellitus ([place]) 09/02/2013  . Essential (primary) hypertension 09/02/2013  . Gout 12/05/2012    Past Surgical History:  Procedure Laterality Date  . TUBAL LIGATION      OB History    No data available       Home Medications    Prior to Admission medications   Medication Sig Start Date End Date Taking? Authorizing Provider  albuterol (PROVENTIL HFA;VENTOLIN HFA) 108 (90 BASE)  MCG/ACT inhaler Inhale 2 puffs into the lungs every 6 (six) hours as needed for wheezing or shortness of breath. 01/13/15  Yes Mancel Bale, PA-C  cyclobenzaprine (FLEXERIL) 5 MG tablet Take 1 tablet (5 mg total) by mouth at bedtime. 08/25/15  Yes Mancel Bale, PA-C  gabapentin (NEURONTIN) 300 MG capsule Take 3 capsules (900 mg total) by mouth 3 (three) times daily. 08/25/15  Yes Mancel Bale, PA-C  glipiZIDE (GLUCOTROL) 10 MG tablet Take 1 tablet (10 mg total) by mouth 2 (two) times daily before a meal. 08/27/15  Yes Mancel Bale, PA-C  hydrochlorothiazide (HYDRODIURIL) 25 MG tablet Take 1 tablet (25 mg total) by mouth daily. 08/25/15  Yes Mancel Bale, PA-C  lisinopril (PRINIVIL,ZESTRIL) 20 MG tablet Take 1 tablet (20 mg total) by mouth daily. 08/25/15  Yes Mancel Bale, PA-C  olopatadine (PATANOL) 0.1 % ophthalmic solution 1 drop 2 (two) times daily.   Yes Historical Provider, MD  ranitidine (ZANTAC) 150 MG tablet TAKE 1 TABLET (150 MG TOTAL) BY MOUTH 2 (TWO) TIMES DAILY. 06/01/15  Yes Mancel Bale, PA-C  triamcinolone cream (KENALOG) 0.1 % Apply 1 application topically 2 (two) times daily. 08/25/15  Yes Mancel Bale, PA-C    Family History Family History  Problem Relation Age of Onset  . Arthritis Mother   . Heart disease Mother   . Hypertension Mother   . Arthritis Maternal  Grandmother   . Hypertension Maternal Grandmother   . Stroke Maternal Grandfather   . Colon cancer Neg Hx   . Esophageal cancer Neg Hx   . Stomach cancer Neg Hx   . Rectal cancer Neg Hx     Social History Social History  Substance Use Topics  . Smoking status: Current Every Day Smoker    Packs/day: 0.50    Years: 39.00    Types: Cigarettes  . Smokeless tobacco: Never Used  . Alcohol use No     Allergies   Allopurinol   Review of Systems Review of Systems  All other systems reviewed and are negative.    Physical Exam Updated Vital Signs BP 143/72   Pulse 78   Temp 97.8 F (36.6 C) (Oral)    Resp 13   SpO2 100%   Physical Exam General: Well-developed, well-nourished female in no acute distress; appearance consistent with age of record HENT: normocephalic; right forehead hematoma with overlying abrasion; contusion of the upper lip with superficial abrasion of the gum adjacent to the right upper medial incisor, that tooth is tender but not loose on palpation Eyes: pupils equal, round and reactive to light; extraocular muscles intact Neck: supple; no C-spine tenderness Heart: regular rate and rhythm Lungs: clear to auscultation bilaterally Abdomen: soft; nondistended; nontender; no masses or hepatosplenomegaly; bowel sounds present Extremities: No deformity; full range of motion; pulses normal Neurologic: Awake, alert and oriented; motor function intact in all extremities and symmetric; no facial droop Skin: Warm and dry Psychiatric: Normal mood and affect    ED Treatments / Results   Nursing notes and vitals signs, including pulse oximetry, reviewed.  Summary of this visit's results, reviewed by myself:  Labs:  Results for orders placed or performed in visit on 11/02/15 (from the past 24 hour(s))  Glucose, capillary     Status: Abnormal   Collection Time: 11/02/15  7:32 AM  Result Value Ref Range   Glucose-Capillary 147 (H) 65 - 99 mg/dL  Glucose, capillary     Status: Abnormal   Collection Time: 11/02/15  9:35 AM  Result Value Ref Range   Glucose-Capillary 139 (H) 65 - 99 mg/dL   Comment 1 Document in Chart     Imaging Studies: Ct Head Wo Contrast  Result Date: 11/03/2015 CLINICAL DATA:  Patient fell from bed this morning. Hematoma and laceration to the right forehead. History of hypertension and diabetes. EXAM: CT HEAD WITHOUT CONTRAST TECHNIQUE: Contiguous axial images were obtained from the base of the skull through the vertex without intravenous contrast. COMPARISON:  None. FINDINGS: Brain: Mild cerebral atrophy. Mild ventricular dilatation consistent with  central atrophy. No mass effect or midline shift. No abnormal extra-axial fluid collections. Gray-white matter junctions are distinct. Basal cisterns are not effaced. No evidence of acute intracranial hemorrhage. Vascular: Mild vascular calcifications. Skull: No depressed skull fractures. Sinuses/Orbits: No acute finding. Other: Right frontal subcutaneous soft tissue hematoma. IMPRESSION: No acute intracranial abnormalities.  Mild chronic atrophy. Electronically Signed   By: Lucienne Capers M.D.   On: 11/03/2015 06:07   Primary closure of scalp and gum wounds not indicated due to superficiality.  Procedures (including critical care time)   Final Clinical Impressions(s) / ED Diagnoses   Final diagnoses:  Fall from bed, initial encounter  Traumatic hematoma of forehead, initial encounter  Abrasion of forehead, initial encounter  Contusion of lip, initial encounter  Laceration of upper gum, initial encounter      Shanon Rosser, MD 11/03/15 [phone]

## 2015-11-03 NOTE — ED Notes (Signed)
Abrasion to forehead cleaned

## 2015-11-03 NOTE — Telephone Encounter (Signed)
  Follow up Call-  Call back number 11/02/2015  Post procedure Call Back phone  # [phone]  Permission to leave phone message Yes  Some recent data might be hidden     Patient questions:  Do you have a fever, pain , or abdominal swelling? No. Pain Score  0 *  Have you tolerated food without any problems? Yes.    Have you been able to return to your normal activities? Yes.    Do you have any questions about your discharge instructions: Diet   No. Medications  No. Follow up visit  No.  Do you have questions or concerns about your Care? No.  Actions: * If pain score is 4 or above: No action needed, pain <4.

## 2015-11-03 NOTE — ED Notes (Signed)
Pt transported to CT ?

## 2015-11-03 NOTE — ED Notes (Signed)
Bed: [id number] Expected date:  Expected time:  Means of arrival:  Comments: EMS head lac

## 2015-11-03 NOTE — ED Notes (Signed)
MD at bedside. 

## 2015-11-09 ENCOUNTER — Encounter: Payer: Self-pay | Admitting: Gastroenterology

## 2016-03-23 ENCOUNTER — Other Ambulatory Visit: Payer: Self-pay | Admitting: Physician Assistant

## 2016-03-23 DIAGNOSIS — I10 Essential (primary) hypertension: Secondary | ICD-10-CM

## 2016-03-23 DIAGNOSIS — E1149 Type 2 diabetes mellitus with other diabetic neurological complication: Secondary | ICD-10-CM

## 2016-03-24 ENCOUNTER — Other Ambulatory Visit: Payer: Self-pay | Admitting: Physician Assistant

## 2016-03-24 DIAGNOSIS — R062 Wheezing: Secondary | ICD-10-CM

## 2016-04-30 DIAGNOSIS — E785 Hyperlipidemia, unspecified: Secondary | ICD-10-CM | POA: Diagnosis not present

## 2016-04-30 DIAGNOSIS — I129 Hypertensive chronic kidney disease with stage 1 through stage 4 chronic kidney disease, or unspecified chronic kidney disease: Secondary | ICD-10-CM | POA: Diagnosis not present

## 2016-04-30 DIAGNOSIS — Z720 Tobacco use: Secondary | ICD-10-CM | POA: Diagnosis not present

## 2016-04-30 DIAGNOSIS — E1140 Type 2 diabetes mellitus with diabetic neuropathy, unspecified: Secondary | ICD-10-CM | POA: Diagnosis not present

## 2016-04-30 DIAGNOSIS — E1122 Type 2 diabetes mellitus with diabetic chronic kidney disease: Secondary | ICD-10-CM | POA: Diagnosis not present

## 2016-04-30 DIAGNOSIS — D631 Anemia in chronic kidney disease: Secondary | ICD-10-CM | POA: Diagnosis not present

## 2016-04-30 DIAGNOSIS — K219 Gastro-esophageal reflux disease without esophagitis: Secondary | ICD-10-CM | POA: Diagnosis not present

## 2016-04-30 DIAGNOSIS — M1990 Unspecified osteoarthritis, unspecified site: Secondary | ICD-10-CM | POA: Diagnosis not present

## 2016-04-30 DIAGNOSIS — E669 Obesity, unspecified: Secondary | ICD-10-CM | POA: Diagnosis not present

## 2016-04-30 DIAGNOSIS — N2581 Secondary hyperparathyroidism of renal origin: Secondary | ICD-10-CM | POA: Diagnosis not present

## 2016-04-30 DIAGNOSIS — N183 Chronic kidney disease, stage 3 (moderate): Secondary | ICD-10-CM | POA: Diagnosis not present

## 2016-04-30 DIAGNOSIS — M109 Gout, unspecified: Secondary | ICD-10-CM | POA: Diagnosis not present

## 2016-05-04 DIAGNOSIS — M25551 Pain in right hip: Secondary | ICD-10-CM | POA: Diagnosis not present

## 2016-05-04 DIAGNOSIS — G8929 Other chronic pain: Secondary | ICD-10-CM | POA: Diagnosis not present

## 2016-05-04 DIAGNOSIS — I10 Essential (primary) hypertension: Secondary | ICD-10-CM | POA: Diagnosis not present

## 2016-05-04 DIAGNOSIS — M25561 Pain in right knee: Secondary | ICD-10-CM | POA: Diagnosis not present

## 2016-05-04 DIAGNOSIS — E1121 Type 2 diabetes mellitus with diabetic nephropathy: Secondary | ICD-10-CM | POA: Diagnosis not present

## 2016-05-04 DIAGNOSIS — K219 Gastro-esophageal reflux disease without esophagitis: Secondary | ICD-10-CM | POA: Diagnosis not present

## 2016-05-04 DIAGNOSIS — Z1159 Encounter for screening for other viral diseases: Secondary | ICD-10-CM | POA: Diagnosis not present

## 2016-05-04 DIAGNOSIS — N183 Chronic kidney disease, stage 3 (moderate): Secondary | ICD-10-CM | POA: Diagnosis not present

## 2016-05-04 DIAGNOSIS — Z7689 Persons encountering health services in other specified circumstances: Secondary | ICD-10-CM | POA: Diagnosis not present

## 2016-05-04 DIAGNOSIS — M25552 Pain in left hip: Secondary | ICD-10-CM | POA: Diagnosis not present

## 2016-05-04 DIAGNOSIS — K449 Diaphragmatic hernia without obstruction or gangrene: Secondary | ICD-10-CM | POA: Diagnosis not present

## 2016-05-04 DIAGNOSIS — M25562 Pain in left knee: Secondary | ICD-10-CM | POA: Diagnosis not present

## 2016-05-11 ENCOUNTER — Other Ambulatory Visit: Payer: Self-pay | Admitting: Family Medicine

## 2016-05-11 ENCOUNTER — Ambulatory Visit
Admission: RE | Admit: 2016-05-11 | Discharge: 2016-05-11 | Disposition: A | Discharge status: Home / Self Care | Payer: PPO | Source: Ambulatory Visit | Attending: Family Medicine | Admitting: Family Medicine

## 2016-05-11 DIAGNOSIS — M25562 Pain in left knee: Secondary | ICD-10-CM

## 2016-05-11 DIAGNOSIS — M25552 Pain in left hip: Secondary | ICD-10-CM

## 2016-05-11 DIAGNOSIS — M25561 Pain in right knee: Secondary | ICD-10-CM

## 2016-05-11 DIAGNOSIS — M25551 Pain in right hip: Secondary | ICD-10-CM

## 2016-05-11 DIAGNOSIS — G8929 Other chronic pain: Secondary | ICD-10-CM

## 2016-05-11 DIAGNOSIS — M160 Bilateral primary osteoarthritis of hip: Secondary | ICD-10-CM | POA: Diagnosis not present

## 2016-05-11 DIAGNOSIS — M170 Bilateral primary osteoarthritis of knee: Secondary | ICD-10-CM | POA: Diagnosis not present

## 2016-05-15 ENCOUNTER — Other Ambulatory Visit: Payer: Self-pay | Admitting: Family Medicine

## 2016-05-15 DIAGNOSIS — M25551 Pain in right hip: Secondary | ICD-10-CM

## 2016-05-15 DIAGNOSIS — G8929 Other chronic pain: Secondary | ICD-10-CM

## 2016-05-15 DIAGNOSIS — R9389 Abnormal findings on diagnostic imaging of other specified body structures: Secondary | ICD-10-CM

## 2016-05-28 ENCOUNTER — Other Ambulatory Visit: Payer: PPO

## 2016-06-01 ENCOUNTER — Encounter: Payer: Self-pay | Admitting: Physical Medicine & Rehabilitation

## 2016-06-07 ENCOUNTER — Ambulatory Visit
Admission: RE | Admit: 2016-06-07 | Discharge: 2016-06-07 | Disposition: A | Discharge status: Home / Self Care | Payer: PPO | Source: Ambulatory Visit | Attending: Family Medicine | Admitting: Family Medicine

## 2016-06-07 DIAGNOSIS — M25551 Pain in right hip: Secondary | ICD-10-CM

## 2016-06-07 DIAGNOSIS — R9389 Abnormal findings on diagnostic imaging of other specified body structures: Secondary | ICD-10-CM

## 2016-06-07 DIAGNOSIS — G8929 Other chronic pain: Secondary | ICD-10-CM

## 2016-06-28 ENCOUNTER — Ambulatory Visit: Payer: PPO | Admitting: Physical Medicine & Rehabilitation

## 2016-07-18 ENCOUNTER — Ambulatory Visit: Payer: PPO | Admitting: Physical Medicine & Rehabilitation

## 2016-08-30 ENCOUNTER — Encounter (HOSPITAL_COMMUNITY): Payer: Self-pay | Admitting: Emergency Medicine

## 2016-08-30 ENCOUNTER — Emergency Department (HOSPITAL_COMMUNITY)
Admission: EM | Admit: 2016-08-30 | Discharge: 2016-08-30 | Disposition: A | Discharge status: Home / Self Care | Payer: PPO | Attending: Emergency Medicine | Admitting: Emergency Medicine

## 2016-08-30 DIAGNOSIS — J45909 Unspecified asthma, uncomplicated: Secondary | ICD-10-CM | POA: Diagnosis not present

## 2016-08-30 DIAGNOSIS — Y929 Unspecified place or not applicable: Secondary | ICD-10-CM | POA: Diagnosis not present

## 2016-08-30 DIAGNOSIS — I129 Hypertensive chronic kidney disease with stage 1 through stage 4 chronic kidney disease, or unspecified chronic kidney disease: Secondary | ICD-10-CM | POA: Diagnosis not present

## 2016-08-30 DIAGNOSIS — Y939 Activity, unspecified: Secondary | ICD-10-CM | POA: Insufficient documentation

## 2016-08-30 DIAGNOSIS — Y33XXXA Other specified events, undetermined intent, initial encounter: Secondary | ICD-10-CM | POA: Diagnosis not present

## 2016-08-30 DIAGNOSIS — T783XXA Angioneurotic edema, initial encounter: Secondary | ICD-10-CM

## 2016-08-30 DIAGNOSIS — R600 Localized edema: Secondary | ICD-10-CM | POA: Diagnosis not present

## 2016-08-30 DIAGNOSIS — E1140 Type 2 diabetes mellitus with diabetic neuropathy, unspecified: Secondary | ICD-10-CM | POA: Insufficient documentation

## 2016-08-30 DIAGNOSIS — Y999 Unspecified external cause status: Secondary | ICD-10-CM | POA: Insufficient documentation

## 2016-08-30 DIAGNOSIS — R030 Elevated blood-pressure reading, without diagnosis of hypertension: Secondary | ICD-10-CM | POA: Diagnosis not present

## 2016-08-30 DIAGNOSIS — E1122 Type 2 diabetes mellitus with diabetic chronic kidney disease: Secondary | ICD-10-CM | POA: Diagnosis not present

## 2016-08-30 DIAGNOSIS — F17210 Nicotine dependence, cigarettes, uncomplicated: Secondary | ICD-10-CM | POA: Insufficient documentation

## 2016-08-30 DIAGNOSIS — Z79899 Other long term (current) drug therapy: Secondary | ICD-10-CM | POA: Insufficient documentation

## 2016-08-30 DIAGNOSIS — Z7984 Long term (current) use of oral hypoglycemic drugs: Secondary | ICD-10-CM | POA: Insufficient documentation

## 2016-08-30 DIAGNOSIS — N183 Chronic kidney disease, stage 3 (moderate): Secondary | ICD-10-CM | POA: Diagnosis not present

## 2016-08-30 MED ORDER — PREDNISONE 20 MG PO TABS: ORAL_TABLET | ORAL | 0 refills | Status: DC

## 2016-08-30 MED ORDER — FAMOTIDINE 20 MG PO TABS
20.0000 mg | ORAL_TABLET | Freq: Once | ORAL | Status: AC
  Administered 2016-08-30: 20 mg via ORAL
  Filled 2016-08-30: qty 1

## 2016-08-30 NOTE — ED Provider Notes (Signed)
[facility] DEPT Provider Note   CSN: [id number] Arrival date & time: 08/30/16  0409     History   Chief Complaint Chief Complaint  Patient presents with  . Oral Swelling    HPI Zoe Clarke is a 66 y.o. female.  66 yo F with a chief complaints of right-sided lip swelling. Going on since about 11:00 this evening. Slowly getting worse. Call 911. Got epinephrine and Solu-Medrol and Benadryl. She thinks it maybe improved a little bit. Denied shortness of breath or trouble with her secretions. Denied trouble swallowing. Denied rash denied wheezing denied nausea vomiting or diarrhea.   The history is provided by the patient.  Illness  This is a new problem. The current episode started yesterday. The problem occurs constantly. The problem has not changed since onset.Pertinent negatives include no chest pain, no headaches and no shortness of breath. Nothing aggravates the symptoms. Nothing relieves the symptoms. She has tried nothing for the symptoms. The treatment provided no relief.    Past Medical History:  Diagnosis Date  . Allergy   . Arthritis   . Asthma   . Diabetes mellitus without complication ([place])   . Gastric ulcer   . GERD (gastroesophageal reflux disease)   . History of blood transfusion 1972  . Hypertension   . Osteoporosis     Patient Active Problem List   Diagnosis Date Noted  . Knee pain, bilateral 01/13/2015  . Bilateral hip pain 11/23/2014  . Diabetes mellitus type 2, uncontrolled, with complications ([place]) 22/63/3354  . Intertrigo 10/25/2014  . GERD (gastroesophageal reflux disease) 10/25/2014  . Diabetic neuropathy ([place]) 10/25/2014  . Chronic low back pain 10/25/2014  . Current tobacco use 11/11/2013  . Stage 3 chronic kidney disease due to type 2 diabetes mellitus ([place]) 09/02/2013  . Essential (primary) hypertension 09/02/2013  . Gout 12/05/2012    Past Surgical History:  Procedure Laterality Date  . TUBAL LIGATION      OB History    No  data available       Home Medications    Prior to Admission medications   Medication Sig Start Date End Date Taking? Authorizing Provider  albuterol (PROVENTIL HFA;VENTOLIN HFA) 108 (90 BASE) MCG/ACT inhaler Inhale 2 puffs into the lungs every 6 (six) hours as needed for wheezing or shortness of breath. 01/13/15   Weber, Damaris Hippo, PA-C  cyclobenzaprine (FLEXERIL) 5 MG tablet Take 1 tablet (5 mg total) by mouth at bedtime. 08/25/15   Weber, Damaris Hippo, PA-C  gabapentin (NEURONTIN) 300 MG capsule Take 3 capsules (900 mg total) by mouth 3 (three) times daily. 08/25/15   Gale Journey, Damaris Hippo, PA-C  glipiZIDE (GLUCOTROL) 10 MG tablet Take 1 tablet (10 mg total) by mouth 2 (two) times daily before a meal. 08/27/15   Weber, Damaris Hippo, PA-C  hydrochlorothiazide (HYDRODIURIL) 25 MG tablet Take 1 tablet (25 mg total) by mouth daily. 08/25/15   Weber, Damaris Hippo, PA-C  olopatadine (PATANOL) 0.1 % ophthalmic solution 1 drop 2 (two) times daily.    [provider]  predniSONE (DELTASONE) 20 MG tablet 2 tabs po daily x 4 days 08/30/16   Deno Etienne, DO  ranitidine (ZANTAC) 150 MG tablet TAKE 1 TABLET (150 MG TOTAL) BY MOUTH 2 (TWO) TIMES DAILY. 06/01/15   Weber, Damaris Hippo, PA-C  triamcinolone cream (KENALOG) 0.1 % Apply 1 application topically 2 (two) times daily. 08/25/15   Gale Journey, Damaris Hippo, PA-C    Family History Family History  Problem Relation Age of Onset  .  Arthritis Mother   . Heart disease Mother   . Hypertension Mother   . Arthritis Maternal Grandmother   . Hypertension Maternal Grandmother   . Stroke Maternal Grandfather   . Colon cancer Neg Hx   . Esophageal cancer Neg Hx   . Stomach cancer Neg Hx   . Rectal cancer Neg Hx     Social History Social History  Substance Use Topics  . Smoking status: Current Every Day Smoker    Packs/day: 0.50    Years: 39.00    Types: Cigarettes  . Smokeless tobacco: Never Used  . Alcohol use No     Allergies   Allopurinol   Review of Systems Review of Systems   Constitutional: Negative for chills and fever.  HENT: Positive for facial swelling. Negative for congestion and rhinorrhea.   Eyes: Negative for redness and visual disturbance.  Respiratory: Negative for shortness of breath and wheezing.   Cardiovascular: Negative for chest pain and palpitations.  Gastrointestinal: Negative for nausea and vomiting.  Genitourinary: Negative for dysuria and urgency.  Musculoskeletal: Negative for arthralgias and myalgias.  Skin: Negative for pallor and wound.  Neurological: Negative for dizziness and headaches.     Physical Exam Updated Vital Signs BP (!) 174/83   Pulse 71   Resp 18   Ht 5\' 8"  (1.727 m)   Wt 103.9 kg (229 lb)   SpO2 99%   BMI 34.82 kg/m   Physical Exam  Constitutional: She is oriented to person, place, and time. She appears well-developed and well-nourished. No distress.  HENT:  Head: Normocephalic and atraumatic.  Right-sided lip swelling. Sparing the tongue and the posterior oropharynx.   Eyes: EOM are normal. Pupils are equal, round, and reactive to light.  Neck: Normal range of motion. Neck supple.  Cardiovascular: Normal rate and regular rhythm.  Exam reveals no gallop and no friction rub.   No murmur heard. Pulmonary/Chest: Effort normal. She has no wheezes. She has no rales.  Abdominal: Soft. She exhibits no distension. There is no tenderness.  Musculoskeletal: She exhibits no edema or tenderness.  Neurological: She is alert and oriented to person, place, and time.  Skin: Skin is warm and dry. No rash noted. She is not diaphoretic.  Psychiatric: She has a normal mood and affect. Her behavior is normal.  Nursing note and vitals reviewed.    ED Treatments / Results  Labs (all labs ordered are listed, but only abnormal results are displayed) Labs Reviewed - No data to display  EKG  EKG Interpretation None       Radiology No results found.  Procedures Procedures (including critical care  time)  Medications Ordered in ED Medications  famotidine (PEPCID) tablet 20 mg (20 mg Oral Given 08/30/16 0449)     Initial Impression / Assessment and Plan / ED Course  I have reviewed the triage vital signs and the nursing notes.  Pertinent labs & imaging results that were available during my care of the patient were reviewed by me and considered in my medical decision making (see chart for details).     66 yo F With a chief complaint of lip swelling. Going on for past 6 hours. Mild improvement per patient. She was just given epinephrine I will observe the patient in ED.  Patient with no worsening throughout her stay in the ED. Will start on a first dose of steroids. Have her stop her lisinopril. PCP follow-up.  6:37 AM:  I have discussed the diagnosis/risks/treatment options with the  patient and family and believe the pt to be eligible for discharge home to follow-up with PCP. We also discussed returning to the ED immediately if new or worsening sx occur. We discussed the sx which are most concerning (e.g., sudden worsening pain, fever, inability to tolerate by mouth) that necessitate immediate return. Medications administered to the patient during their visit and any new prescriptions provided to the patient are listed below.  Medications given during this visit Medications  famotidine (PEPCID) tablet 20 mg (20 mg Oral Given 08/30/16 0449)     The patient appears reasonably screen and/or stabilized for discharge and I doubt any other medical condition or other [facility] requiring further screening, evaluation, or treatment in the ED at this time prior to discharge.    Final Clinical Impressions(s) / ED Diagnoses   Final diagnoses:  Angioedema, initial encounter    New Prescriptions New Prescriptions   PREDNISONE (DELTASONE) 20 MG TABLET    2 tabs po daily x 4 days     Deno Etienne, DO 08/30/16 [phone]

## 2016-08-30 NOTE — ED Triage Notes (Signed)
Pt noticed lip swelling around 2300 put ice on it and went to bed. Woke this morning with worsening lip swelling. 125 solumedrol. 0.3 epi and 50mg  benadryl.

## 2016-08-30 NOTE — ED Notes (Signed)
Pt understood dc material. NAD noted. Scripts given at dc 

## 2016-09-18 DIAGNOSIS — M1990 Unspecified osteoarthritis, unspecified site: Secondary | ICD-10-CM | POA: Diagnosis not present

## 2016-09-18 DIAGNOSIS — E669 Obesity, unspecified: Secondary | ICD-10-CM | POA: Diagnosis not present

## 2016-09-18 DIAGNOSIS — I129 Hypertensive chronic kidney disease with stage 1 through stage 4 chronic kidney disease, or unspecified chronic kidney disease: Secondary | ICD-10-CM | POA: Diagnosis not present

## 2016-09-18 DIAGNOSIS — Z720 Tobacco use: Secondary | ICD-10-CM | POA: Diagnosis not present

## 2016-09-18 DIAGNOSIS — E1122 Type 2 diabetes mellitus with diabetic chronic kidney disease: Secondary | ICD-10-CM | POA: Diagnosis not present

## 2016-09-18 DIAGNOSIS — K219 Gastro-esophageal reflux disease without esophagitis: Secondary | ICD-10-CM | POA: Diagnosis not present

## 2016-09-18 DIAGNOSIS — D631 Anemia in chronic kidney disease: Secondary | ICD-10-CM | POA: Diagnosis not present

## 2016-09-18 DIAGNOSIS — N183 Chronic kidney disease, stage 3 (moderate): Secondary | ICD-10-CM | POA: Diagnosis not present

## 2016-09-18 DIAGNOSIS — N2581 Secondary hyperparathyroidism of renal origin: Secondary | ICD-10-CM | POA: Diagnosis not present

## 2016-12-12 ENCOUNTER — Telehealth: Payer: Self-pay

## 2016-12-12 DIAGNOSIS — E1121 Type 2 diabetes mellitus with diabetic nephropathy: Secondary | ICD-10-CM | POA: Diagnosis not present

## 2016-12-12 DIAGNOSIS — I10 Essential (primary) hypertension: Secondary | ICD-10-CM | POA: Diagnosis not present

## 2016-12-12 DIAGNOSIS — R05 Cough: Secondary | ICD-10-CM | POA: Diagnosis not present

## 2016-12-12 DIAGNOSIS — M79651 Pain in right thigh: Secondary | ICD-10-CM | POA: Diagnosis not present

## 2016-12-12 DIAGNOSIS — Z9119 Patient's noncompliance with other medical treatment and regimen: Secondary | ICD-10-CM | POA: Diagnosis not present

## 2016-12-12 DIAGNOSIS — G8929 Other chronic pain: Secondary | ICD-10-CM | POA: Diagnosis not present

## 2016-12-12 DIAGNOSIS — K219 Gastro-esophageal reflux disease without esophagitis: Secondary | ICD-10-CM | POA: Diagnosis not present

## 2016-12-12 DIAGNOSIS — R5381 Other malaise: Secondary | ICD-10-CM | POA: Diagnosis not present

## 2016-12-12 DIAGNOSIS — K449 Diaphragmatic hernia without obstruction or gangrene: Secondary | ICD-10-CM | POA: Diagnosis not present

## 2016-12-12 DIAGNOSIS — Z1231 Encounter for screening mammogram for malignant neoplasm of breast: Secondary | ICD-10-CM | POA: Diagnosis not present

## 2016-12-12 DIAGNOSIS — N183 Chronic kidney disease, stage 3 (moderate): Secondary | ICD-10-CM | POA: Diagnosis not present

## 2016-12-12 NOTE — Telephone Encounter (Signed)
Called pt to schedule Medicare Annual Wellness Visit. Pt reports that she has a new PCP. I have updated her chart to reflect this change.    Zoe Clarke, B.A.  Care Guide [phone]

## 2017-01-07 ENCOUNTER — Ambulatory Visit
Admission: RE | Admit: 2017-01-07 | Discharge: 2017-01-07 | Disposition: A | Discharge status: Home / Self Care | Payer: PPO | Source: Ambulatory Visit | Attending: Family Medicine | Admitting: Family Medicine

## 2017-01-07 ENCOUNTER — Other Ambulatory Visit: Payer: Self-pay | Admitting: Family Medicine

## 2017-01-07 DIAGNOSIS — R059 Cough, unspecified: Secondary | ICD-10-CM

## 2017-01-07 DIAGNOSIS — Z1231 Encounter for screening mammogram for malignant neoplasm of breast: Secondary | ICD-10-CM | POA: Diagnosis not present

## 2017-01-07 DIAGNOSIS — R05 Cough: Secondary | ICD-10-CM

## 2017-01-07 DIAGNOSIS — M1611 Unilateral primary osteoarthritis, right hip: Secondary | ICD-10-CM | POA: Diagnosis not present

## 2017-01-07 DIAGNOSIS — M79651 Pain in right thigh: Secondary | ICD-10-CM

## 2017-06-11 DIAGNOSIS — E785 Hyperlipidemia, unspecified: Secondary | ICD-10-CM | POA: Diagnosis not present

## 2017-06-11 DIAGNOSIS — M5416 Radiculopathy, lumbar region: Secondary | ICD-10-CM | POA: Diagnosis not present

## 2017-06-11 DIAGNOSIS — Z9119 Patient's noncompliance with other medical treatment and regimen: Secondary | ICD-10-CM | POA: Diagnosis not present

## 2017-06-11 DIAGNOSIS — L299 Pruritus, unspecified: Secondary | ICD-10-CM | POA: Diagnosis not present

## 2017-06-11 DIAGNOSIS — E1165 Type 2 diabetes mellitus with hyperglycemia: Secondary | ICD-10-CM | POA: Diagnosis not present

## 2017-06-11 DIAGNOSIS — R05 Cough: Secondary | ICD-10-CM | POA: Diagnosis not present

## 2017-06-11 DIAGNOSIS — Z720 Tobacco use: Secondary | ICD-10-CM | POA: Diagnosis not present

## 2017-06-11 DIAGNOSIS — K219 Gastro-esophageal reflux disease without esophagitis: Secondary | ICD-10-CM | POA: Diagnosis not present

## 2017-06-11 DIAGNOSIS — I10 Essential (primary) hypertension: Secondary | ICD-10-CM | POA: Diagnosis not present

## 2017-06-11 DIAGNOSIS — J209 Acute bronchitis, unspecified: Secondary | ICD-10-CM | POA: Diagnosis not present

## 2017-06-11 DIAGNOSIS — G8929 Other chronic pain: Secondary | ICD-10-CM | POA: Diagnosis not present

## 2017-06-11 DIAGNOSIS — N183 Chronic kidney disease, stage 3 (moderate): Secondary | ICD-10-CM | POA: Diagnosis not present

## 2017-06-11 DIAGNOSIS — E1121 Type 2 diabetes mellitus with diabetic nephropathy: Secondary | ICD-10-CM | POA: Diagnosis not present

## 2017-06-11 DIAGNOSIS — Z6841 Body Mass Index (BMI) 40.0 and over, adult: Secondary | ICD-10-CM | POA: Diagnosis not present

## 2017-06-11 DIAGNOSIS — Z7984 Long term (current) use of oral hypoglycemic drugs: Secondary | ICD-10-CM | POA: Diagnosis not present

## 2017-06-11 DIAGNOSIS — D72828 Other elevated white blood cell count: Secondary | ICD-10-CM | POA: Diagnosis not present

## 2017-06-25 DIAGNOSIS — D72829 Elevated white blood cell count, unspecified: Secondary | ICD-10-CM | POA: Diagnosis not present

## 2017-06-26 DIAGNOSIS — Z780 Asymptomatic menopausal state: Secondary | ICD-10-CM | POA: Diagnosis not present

## 2017-07-22 DIAGNOSIS — E669 Obesity, unspecified: Secondary | ICD-10-CM | POA: Diagnosis not present

## 2017-07-22 DIAGNOSIS — E785 Hyperlipidemia, unspecified: Secondary | ICD-10-CM | POA: Diagnosis not present

## 2017-07-22 DIAGNOSIS — K219 Gastro-esophageal reflux disease without esophagitis: Secondary | ICD-10-CM | POA: Diagnosis not present

## 2017-07-22 DIAGNOSIS — Z720 Tobacco use: Secondary | ICD-10-CM | POA: Diagnosis not present

## 2017-07-22 DIAGNOSIS — D631 Anemia in chronic kidney disease: Secondary | ICD-10-CM | POA: Diagnosis not present

## 2017-07-22 DIAGNOSIS — I129 Hypertensive chronic kidney disease with stage 1 through stage 4 chronic kidney disease, or unspecified chronic kidney disease: Secondary | ICD-10-CM | POA: Diagnosis not present

## 2017-07-22 DIAGNOSIS — M109 Gout, unspecified: Secondary | ICD-10-CM | POA: Diagnosis not present

## 2017-07-22 DIAGNOSIS — N2581 Secondary hyperparathyroidism of renal origin: Secondary | ICD-10-CM | POA: Diagnosis not present

## 2017-07-22 DIAGNOSIS — N183 Chronic kidney disease, stage 3 (moderate): Secondary | ICD-10-CM | POA: Diagnosis not present

## 2017-07-22 DIAGNOSIS — N189 Chronic kidney disease, unspecified: Secondary | ICD-10-CM | POA: Diagnosis not present

## 2017-07-22 DIAGNOSIS — E1140 Type 2 diabetes mellitus with diabetic neuropathy, unspecified: Secondary | ICD-10-CM | POA: Diagnosis not present

## 2017-07-22 DIAGNOSIS — E1122 Type 2 diabetes mellitus with diabetic chronic kidney disease: Secondary | ICD-10-CM | POA: Diagnosis not present

## 2017-07-22 DIAGNOSIS — M1990 Unspecified osteoarthritis, unspecified site: Secondary | ICD-10-CM | POA: Diagnosis not present

## 2017-08-06 DIAGNOSIS — N183 Chronic kidney disease, stage 3 (moderate): Secondary | ICD-10-CM | POA: Diagnosis not present

## 2017-08-06 DIAGNOSIS — M25552 Pain in left hip: Secondary | ICD-10-CM | POA: Diagnosis not present

## 2017-08-06 DIAGNOSIS — I10 Essential (primary) hypertension: Secondary | ICD-10-CM | POA: Diagnosis not present

## 2017-08-06 DIAGNOSIS — Z7984 Long term (current) use of oral hypoglycemic drugs: Secondary | ICD-10-CM | POA: Diagnosis not present

## 2017-08-06 DIAGNOSIS — M7989 Other specified soft tissue disorders: Secondary | ICD-10-CM | POA: Diagnosis not present

## 2017-08-06 DIAGNOSIS — N898 Other specified noninflammatory disorders of vagina: Secondary | ICD-10-CM | POA: Diagnosis not present

## 2017-08-06 DIAGNOSIS — G8929 Other chronic pain: Secondary | ICD-10-CM | POA: Diagnosis not present

## 2017-08-06 DIAGNOSIS — M25551 Pain in right hip: Secondary | ICD-10-CM | POA: Diagnosis not present

## 2017-08-06 DIAGNOSIS — R05 Cough: Secondary | ICD-10-CM | POA: Diagnosis not present

## 2017-08-06 DIAGNOSIS — E785 Hyperlipidemia, unspecified: Secondary | ICD-10-CM | POA: Diagnosis not present

## 2017-08-06 DIAGNOSIS — Z6841 Body Mass Index (BMI) 40.0 and over, adult: Secondary | ICD-10-CM | POA: Diagnosis not present

## 2017-08-06 DIAGNOSIS — E1165 Type 2 diabetes mellitus with hyperglycemia: Secondary | ICD-10-CM | POA: Diagnosis not present

## 2017-08-26 ENCOUNTER — Telehealth: Payer: Self-pay | Admitting: Hematology

## 2017-08-26 ENCOUNTER — Encounter: Payer: Self-pay | Admitting: Hematology

## 2017-08-26 NOTE — Telephone Encounter (Signed)
New referral received from Dr. Inda Castle from [facility] for dx of elevated wbc. Pt has been scheduled to see Dr. Burr Medico on 6/25 at 1:15pm. Letter mailed to the pt.

## 2017-09-03 ENCOUNTER — Ambulatory Visit
Admission: RE | Admit: 2017-09-03 | Discharge: 2017-09-03 | Disposition: A | Discharge status: Home / Self Care | Payer: PPO | Source: Ambulatory Visit | Attending: Family Medicine | Admitting: Family Medicine

## 2017-09-03 ENCOUNTER — Encounter: Payer: Self-pay | Admitting: Hematology

## 2017-09-03 ENCOUNTER — Other Ambulatory Visit: Payer: Self-pay | Admitting: Family Medicine

## 2017-09-03 ENCOUNTER — Telehealth: Payer: Self-pay | Admitting: Hematology

## 2017-09-03 DIAGNOSIS — R05 Cough: Secondary | ICD-10-CM

## 2017-09-03 DIAGNOSIS — R600 Localized edema: Secondary | ICD-10-CM | POA: Diagnosis not present

## 2017-09-03 DIAGNOSIS — M7989 Other specified soft tissue disorders: Secondary | ICD-10-CM

## 2017-09-03 DIAGNOSIS — R053 Chronic cough: Secondary | ICD-10-CM

## 2017-09-03 NOTE — Telephone Encounter (Signed)
Called the pt to reschedule appt with Dr. Burr Medico. Pt's appt has been rescheduled for the pt to see Dr. Burr Medico on 6/25 at 830am. Pt has agreed to the appt date and time. New letter mailed.
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
## 2017-09-10 ENCOUNTER — Inpatient Hospital Stay: Payer: PPO

## 2017-09-10 ENCOUNTER — Encounter: Payer: Self-pay | Admitting: Hematology

## 2017-09-10 ENCOUNTER — Encounter: Payer: PPO | Admitting: Hematology

## 2017-09-10 ENCOUNTER — Telehealth: Payer: Self-pay | Admitting: Hematology

## 2017-09-10 ENCOUNTER — Inpatient Hospital Stay: Payer: PPO | Attending: Hematology | Admitting: Hematology

## 2017-09-10 DIAGNOSIS — E1122 Type 2 diabetes mellitus with diabetic chronic kidney disease: Secondary | ICD-10-CM | POA: Diagnosis not present

## 2017-09-10 DIAGNOSIS — J45909 Unspecified asthma, uncomplicated: Secondary | ICD-10-CM | POA: Diagnosis not present

## 2017-09-10 DIAGNOSIS — D72823 Leukemoid reaction: Secondary | ICD-10-CM

## 2017-09-10 DIAGNOSIS — I129 Hypertensive chronic kidney disease with stage 1 through stage 4 chronic kidney disease, or unspecified chronic kidney disease: Secondary | ICD-10-CM | POA: Insufficient documentation

## 2017-09-10 DIAGNOSIS — Z7984 Long term (current) use of oral hypoglycemic drugs: Secondary | ICD-10-CM | POA: Diagnosis not present

## 2017-09-10 DIAGNOSIS — F17210 Nicotine dependence, cigarettes, uncomplicated: Secondary | ICD-10-CM | POA: Insufficient documentation

## 2017-09-10 DIAGNOSIS — D72829 Elevated white blood cell count, unspecified: Secondary | ICD-10-CM | POA: Diagnosis not present

## 2017-09-10 DIAGNOSIS — Z79899 Other long term (current) drug therapy: Secondary | ICD-10-CM | POA: Insufficient documentation

## 2017-09-10 DIAGNOSIS — D649 Anemia, unspecified: Secondary | ICD-10-CM | POA: Insufficient documentation

## 2017-09-10 DIAGNOSIS — N183 Chronic kidney disease, stage 3 (moderate): Secondary | ICD-10-CM

## 2017-09-10 DIAGNOSIS — K219 Gastro-esophageal reflux disease without esophagitis: Secondary | ICD-10-CM | POA: Diagnosis not present

## 2017-09-10 DIAGNOSIS — D638 Anemia in other chronic diseases classified elsewhere: Secondary | ICD-10-CM | POA: Insufficient documentation

## 2017-09-10 LAB — CBC WITH DIFFERENTIAL (CANCER CENTER ONLY)
Basophils Absolute: 0 10*3/uL (ref 0.0–0.1)
Basophils Relative: 0 %
EOS ABS: 0.2 10*3/uL (ref 0.0–0.5)
Eosinophils Relative: 2 %
HCT: 37.4 % (ref 34.8–46.6)
HEMOGLOBIN: 12.2 g/dL (ref 11.6–15.9)
LYMPHS ABS: 2.6 10*3/uL (ref 0.9–3.3)
Lymphocytes Relative: 25 %
MCH: 27.1 pg (ref 25.1–34.0)
MCHC: 32.7 g/dL (ref 31.5–36.0)
MCV: 82.7 fL (ref 79.5–101.0)
Monocytes Absolute: 0.6 10*3/uL (ref 0.1–0.9)
Monocytes Relative: 5 %
NEUTROS PCT: 68 %
Neutro Abs: 7.1 10*3/uL — ABNORMAL HIGH (ref 1.5–6.5)
Platelet Count: 290 10*3/uL (ref 145–400)
RBC: 4.52 MIL/uL (ref 3.70–5.45)
RDW: 14.9 % — ABNORMAL HIGH (ref 11.2–14.5)
WBC: 10.5 10*3/uL — AB (ref 3.9–10.3)

## 2017-09-10 LAB — IRON AND TIBC
Iron: 78 ug/dL (ref 41–142)
Saturation Ratios: 27 % (ref 21–57)
TIBC: 293 ug/dL (ref 236–444)
UIBC: 215 ug/dL

## 2017-09-10 LAB — VITAMIN B12: VITAMIN B 12: 270 pg/mL (ref 180–914)

## 2017-09-10 LAB — FERRITIN: Ferritin: 218 ng/mL (ref 11–307)

## 2017-09-10 LAB — SAVE SMEAR

## 2017-09-10 NOTE — Telephone Encounter (Signed)
Scheduled appt per 6/25 los- gave patient AVS and calender per los 

## 2017-09-11 DIAGNOSIS — N183 Chronic kidney disease, stage 3 (moderate): Secondary | ICD-10-CM | POA: Diagnosis not present

## 2017-09-11 DIAGNOSIS — E1165 Type 2 diabetes mellitus with hyperglycemia: Secondary | ICD-10-CM | POA: Diagnosis not present

## 2017-09-11 DIAGNOSIS — Z9111 Patient's noncompliance with dietary regimen: Secondary | ICD-10-CM | POA: Diagnosis not present

## 2017-09-11 DIAGNOSIS — M25551 Pain in right hip: Secondary | ICD-10-CM | POA: Diagnosis not present

## 2017-09-11 DIAGNOSIS — M25552 Pain in left hip: Secondary | ICD-10-CM | POA: Diagnosis not present

## 2017-09-11 DIAGNOSIS — M79621 Pain in right upper arm: Secondary | ICD-10-CM | POA: Diagnosis not present

## 2017-09-11 DIAGNOSIS — I10 Essential (primary) hypertension: Secondary | ICD-10-CM | POA: Diagnosis not present

## 2017-09-11 DIAGNOSIS — M79622 Pain in left upper arm: Secondary | ICD-10-CM | POA: Diagnosis not present

## 2017-09-11 DIAGNOSIS — G8929 Other chronic pain: Secondary | ICD-10-CM | POA: Diagnosis not present

## 2017-09-11 DIAGNOSIS — R490 Dysphonia: Secondary | ICD-10-CM | POA: Diagnosis not present

## 2017-09-11 DIAGNOSIS — K219 Gastro-esophageal reflux disease without esophagitis: Secondary | ICD-10-CM | POA: Diagnosis not present

## 2017-09-11 LAB — FOLATE RBC
Folate, Hemolysate: 381.7 ng/mL
Folate, RBC: 986 ng/mL (ref >498)
HEMATOCRIT: 38.7 % (ref 34.0–46.6)

## 2017-09-11 LAB — PATHOLOGIST SMEAR REVIEW

## 2017-09-13 ENCOUNTER — Ambulatory Visit
Admission: RE | Admit: 2017-09-13 | Discharge: 2017-09-13 | Disposition: A | Discharge status: Home / Self Care | Payer: PPO | Source: Ambulatory Visit | Attending: Family Medicine | Admitting: Family Medicine

## 2017-09-13 ENCOUNTER — Telehealth: Payer: Self-pay

## 2017-09-13 ENCOUNTER — Other Ambulatory Visit: Payer: Self-pay | Admitting: Family Medicine

## 2017-09-13 DIAGNOSIS — M19012 Primary osteoarthritis, left shoulder: Secondary | ICD-10-CM | POA: Diagnosis not present

## 2017-09-13 DIAGNOSIS — Z7689 Persons encountering health services in other specified circumstances: Secondary | ICD-10-CM | POA: Diagnosis not present

## 2017-09-13 DIAGNOSIS — M79622 Pain in left upper arm: Secondary | ICD-10-CM

## 2017-09-13 DIAGNOSIS — M19011 Primary osteoarthritis, right shoulder: Secondary | ICD-10-CM | POA: Diagnosis not present

## 2017-09-13 DIAGNOSIS — M79621 Pain in right upper arm: Secondary | ICD-10-CM

## 2017-09-13 DIAGNOSIS — N2581 Secondary hyperparathyroidism of renal origin: Secondary | ICD-10-CM | POA: Diagnosis not present

## 2017-09-13 NOTE — Telephone Encounter (Signed)
Called patient per Dr. Burr Medico left voice message that iron study, B12 and folate are all normal.  We are still waiting on the gene mutation test result to come back, we will call her when we receive.  Encouraged patient to call back with questions.

## 2017-09-13 NOTE — Telephone Encounter (Signed)
-----   Message from Truitt Merle, MD sent at 09/13/2017  8:29 AM EDT ----- Please let pt know that her iron study, B12 and folate were all normal, the gene mutation test of BCR/ABL is still pending and we will call her when the result is back, thanks  Truitt Merle  09/13/2017

## 2017-09-25 DIAGNOSIS — E1121 Type 2 diabetes mellitus with diabetic nephropathy: Secondary | ICD-10-CM | POA: Diagnosis not present

## 2017-09-25 DIAGNOSIS — E1169 Type 2 diabetes mellitus with other specified complication: Secondary | ICD-10-CM | POA: Diagnosis not present

## 2017-09-25 DIAGNOSIS — F329 Major depressive disorder, single episode, unspecified: Secondary | ICD-10-CM | POA: Diagnosis not present

## 2017-09-25 DIAGNOSIS — E1165 Type 2 diabetes mellitus with hyperglycemia: Secondary | ICD-10-CM | POA: Diagnosis not present

## 2017-09-25 DIAGNOSIS — E1140 Type 2 diabetes mellitus with diabetic neuropathy, unspecified: Secondary | ICD-10-CM | POA: Diagnosis not present

## 2017-09-25 DIAGNOSIS — E785 Hyperlipidemia, unspecified: Secondary | ICD-10-CM | POA: Diagnosis not present

## 2017-09-25 DIAGNOSIS — E1159 Type 2 diabetes mellitus with other circulatory complications: Secondary | ICD-10-CM | POA: Diagnosis not present

## 2017-09-25 DIAGNOSIS — I10 Essential (primary) hypertension: Secondary | ICD-10-CM | POA: Diagnosis not present

## 2017-10-14 ENCOUNTER — Telehealth: Payer: Self-pay

## 2017-10-14 NOTE — Telephone Encounter (Signed)
Spoke with patient per Dr. Burr Medico, explained got gene mutation test results back, all was negative, no evidence of chronic myeloid leukemia.  Patient verbalized an understanding.

## 2017-10-15 LAB — BCR ABL1 FISH (GENPATH)

## 2018-03-03 ENCOUNTER — Other Ambulatory Visit: Payer: PPO

## 2018-03-03 ENCOUNTER — Ambulatory Visit: Payer: PPO | Admitting: Hematology

## 2018-03-07 DIAGNOSIS — I10 Essential (primary) hypertension: Secondary | ICD-10-CM | POA: Diagnosis not present

## 2018-03-07 DIAGNOSIS — M25552 Pain in left hip: Secondary | ICD-10-CM | POA: Diagnosis not present

## 2018-03-07 DIAGNOSIS — E1169 Type 2 diabetes mellitus with other specified complication: Secondary | ICD-10-CM | POA: Diagnosis not present

## 2018-03-07 DIAGNOSIS — K219 Gastro-esophageal reflux disease without esophagitis: Secondary | ICD-10-CM | POA: Diagnosis not present

## 2018-03-07 DIAGNOSIS — N183 Chronic kidney disease, stage 3 (moderate): Secondary | ICD-10-CM | POA: Diagnosis not present

## 2018-03-07 DIAGNOSIS — G8929 Other chronic pain: Secondary | ICD-10-CM | POA: Diagnosis not present

## 2018-03-07 DIAGNOSIS — D72829 Elevated white blood cell count, unspecified: Secondary | ICD-10-CM | POA: Diagnosis not present

## 2018-03-07 DIAGNOSIS — R399 Unspecified symptoms and signs involving the genitourinary system: Secondary | ICD-10-CM | POA: Diagnosis not present

## 2018-03-07 DIAGNOSIS — R062 Wheezing: Secondary | ICD-10-CM | POA: Diagnosis not present

## 2018-03-07 DIAGNOSIS — M25551 Pain in right hip: Secondary | ICD-10-CM | POA: Diagnosis not present

## 2018-03-07 DIAGNOSIS — E7800 Pure hypercholesterolemia, unspecified: Secondary | ICD-10-CM | POA: Diagnosis not present

## 2018-03-20 ENCOUNTER — Other Ambulatory Visit: Payer: Self-pay

## 2018-03-20 NOTE — Patient Outreach (Signed)
[facility]) Care Management  03/20/2018  Janelis Stelzer Aug 01, 1950 [id number]  TELEPHONE SCREENING Referral date: 03/20/18 Referral source: [employer]  Referral reason: Poorly controlled hypertension Insurance: health team advantage Attempt #1  Telephone call to patient regarding [employer] referral. Unable to reach patient. HIPAA compliant voice message left with call back phone number.   PLAN: RNCM will attempt 2nd telephone call to patient within 4 business days. RNCM will send outreach letter.   Quinn Plowman RN,BSN, [place] Telephonic  [phone] .

## 2018-03-24 ENCOUNTER — Other Ambulatory Visit: Payer: Self-pay

## 2018-03-24 NOTE — Patient Outreach (Signed)
[facility]) Care Management  03/24/2018  Aldena Worm 31-Mar-1950 [id number]  TELEPHONE SCREENING Referral date: 03/20/18 Referral source: [employer]  Referral reason: Poorly controlled hypertension Insurance: health team advantage  Telephone call to patient regarding [employer] referral. HIPAA verified with patient. Explained reason for call.  RNCM discussed and offered [facility] care management services. Patient states she does not feel she needs services at this time. Patient reports she has a follow up with her primary MD on 04/07/18. She states she will talk with her doctor further about her blood pressure at her appointment. Patient states she has a blood pressure monitor to check her blood pressure at home.  RNCM offered to mail patient [facility] care management brochure/ magnet for future reference. Patient verbally agreed.   PLAN: RNCM will close patient due to patient being assessed and having no further needs.  RNCM will mail patient [facility] care management brochure/ magnet.  RNCM will send closure letter to patients primary MD.   Quinn Plowman RN,BSN,CCM [facility] Telephonic  [phone]

## 2018-03-28 ENCOUNTER — Ambulatory Visit: Payer: Self-pay | Admitting: Internal Medicine

## 2018-04-10 ENCOUNTER — Encounter (HOSPITAL_COMMUNITY): Payer: Self-pay | Admitting: Emergency Medicine

## 2018-04-10 ENCOUNTER — Emergency Department (HOSPITAL_COMMUNITY): Payer: PPO

## 2018-04-10 ENCOUNTER — Other Ambulatory Visit (HOSPITAL_COMMUNITY): Payer: Self-pay

## 2018-04-10 ENCOUNTER — Inpatient Hospital Stay (HOSPITAL_COMMUNITY)
Admission: EM | Admit: 2018-04-10 | Discharge: 2018-04-14 | DRG: 690 | Disposition: A | Discharge status: Home / Self Care | Payer: PPO | Attending: Internal Medicine | Admitting: Internal Medicine

## 2018-04-10 ENCOUNTER — Other Ambulatory Visit: Payer: Self-pay

## 2018-04-10 DIAGNOSIS — I10 Essential (primary) hypertension: Secondary | ICD-10-CM | POA: Diagnosis present

## 2018-04-10 DIAGNOSIS — E119 Type 2 diabetes mellitus without complications: Secondary | ICD-10-CM | POA: Diagnosis present

## 2018-04-10 DIAGNOSIS — Z8261 Family history of arthritis: Secondary | ICD-10-CM | POA: Diagnosis not present

## 2018-04-10 DIAGNOSIS — Z888 Allergy status to other drugs, medicaments and biological substances status: Secondary | ICD-10-CM | POA: Diagnosis not present

## 2018-04-10 DIAGNOSIS — E1122 Type 2 diabetes mellitus with diabetic chronic kidney disease: Secondary | ICD-10-CM

## 2018-04-10 DIAGNOSIS — N10 Acute tubulo-interstitial nephritis: Secondary | ICD-10-CM | POA: Diagnosis not present

## 2018-04-10 DIAGNOSIS — M810 Age-related osteoporosis without current pathological fracture: Secondary | ICD-10-CM | POA: Diagnosis not present

## 2018-04-10 DIAGNOSIS — E785 Hyperlipidemia, unspecified: Secondary | ICD-10-CM | POA: Diagnosis present

## 2018-04-10 DIAGNOSIS — Z6839 Body mass index (BMI) 39.0-39.9, adult: Secondary | ICD-10-CM

## 2018-04-10 DIAGNOSIS — J45909 Unspecified asthma, uncomplicated: Secondary | ICD-10-CM | POA: Diagnosis not present

## 2018-04-10 DIAGNOSIS — Z7984 Long term (current) use of oral hypoglycemic drugs: Secondary | ICD-10-CM | POA: Diagnosis not present

## 2018-04-10 DIAGNOSIS — K5730 Diverticulosis of large intestine without perforation or abscess without bleeding: Secondary | ICD-10-CM | POA: Diagnosis not present

## 2018-04-10 DIAGNOSIS — Z8249 Family history of ischemic heart disease and other diseases of the circulatory system: Secondary | ICD-10-CM

## 2018-04-10 DIAGNOSIS — Z886 Allergy status to analgesic agent status: Secondary | ICD-10-CM

## 2018-04-10 DIAGNOSIS — D638 Anemia in other chronic diseases classified elsewhere: Secondary | ICD-10-CM | POA: Diagnosis present

## 2018-04-10 DIAGNOSIS — E1165 Type 2 diabetes mellitus with hyperglycemia: Secondary | ICD-10-CM

## 2018-04-10 DIAGNOSIS — D631 Anemia in chronic kidney disease: Secondary | ICD-10-CM | POA: Diagnosis present

## 2018-04-10 DIAGNOSIS — R42 Dizziness and giddiness: Secondary | ICD-10-CM | POA: Diagnosis not present

## 2018-04-10 DIAGNOSIS — F17210 Nicotine dependence, cigarettes, uncomplicated: Secondary | ICD-10-CM | POA: Diagnosis not present

## 2018-04-10 DIAGNOSIS — Z809 Family history of malignant neoplasm, unspecified: Secondary | ICD-10-CM

## 2018-04-10 DIAGNOSIS — N183 Chronic kidney disease, stage 3 unspecified: Secondary | ICD-10-CM | POA: Diagnosis present

## 2018-04-10 DIAGNOSIS — Z823 Family history of stroke: Secondary | ICD-10-CM | POA: Diagnosis not present

## 2018-04-10 DIAGNOSIS — N3000 Acute cystitis without hematuria: Secondary | ICD-10-CM

## 2018-04-10 DIAGNOSIS — I129 Hypertensive chronic kidney disease with stage 1 through stage 4 chronic kidney disease, or unspecified chronic kidney disease: Secondary | ICD-10-CM | POA: Diagnosis present

## 2018-04-10 DIAGNOSIS — E1149 Type 2 diabetes mellitus with other diabetic neurological complication: Secondary | ICD-10-CM

## 2018-04-10 DIAGNOSIS — D649 Anemia, unspecified: Secondary | ICD-10-CM | POA: Diagnosis not present

## 2018-04-10 DIAGNOSIS — R110 Nausea: Secondary | ICD-10-CM | POA: Diagnosis not present

## 2018-04-10 DIAGNOSIS — M1990 Unspecified osteoarthritis, unspecified site: Secondary | ICD-10-CM | POA: Diagnosis present

## 2018-04-10 DIAGNOSIS — E1140 Type 2 diabetes mellitus with diabetic neuropathy, unspecified: Secondary | ICD-10-CM | POA: Diagnosis not present

## 2018-04-10 DIAGNOSIS — R112 Nausea with vomiting, unspecified: Secondary | ICD-10-CM

## 2018-04-10 DIAGNOSIS — K8020 Calculus of gallbladder without cholecystitis without obstruction: Secondary | ICD-10-CM | POA: Diagnosis not present

## 2018-04-10 DIAGNOSIS — K219 Gastro-esophageal reflux disease without esophagitis: Secondary | ICD-10-CM | POA: Diagnosis present

## 2018-04-10 DIAGNOSIS — E118 Type 2 diabetes mellitus with unspecified complications: Secondary | ICD-10-CM | POA: Diagnosis not present

## 2018-04-10 DIAGNOSIS — R1011 Right upper quadrant pain: Secondary | ICD-10-CM

## 2018-04-10 DIAGNOSIS — R0902 Hypoxemia: Secondary | ICD-10-CM | POA: Diagnosis not present

## 2018-04-10 DIAGNOSIS — I160 Hypertensive urgency: Secondary | ICD-10-CM | POA: Diagnosis present

## 2018-04-10 DIAGNOSIS — R109 Unspecified abdominal pain: Secondary | ICD-10-CM | POA: Diagnosis not present

## 2018-04-10 DIAGNOSIS — R062 Wheezing: Secondary | ICD-10-CM

## 2018-04-10 DIAGNOSIS — R1084 Generalized abdominal pain: Secondary | ICD-10-CM | POA: Diagnosis not present

## 2018-04-10 LAB — CBC WITH DIFFERENTIAL/PLATELET
Abs Immature Granulocytes: 0.04 10*3/uL (ref 0.00–0.07)
Basophils Absolute: 0 10*3/uL (ref 0.0–0.1)
Basophils Relative: 0 %
EOS PCT: 0 %
Eosinophils Absolute: 0 10*3/uL (ref 0.0–0.5)
HEMATOCRIT: 31.7 % — AB (ref 36.0–46.0)
Hemoglobin: 9.8 g/dL — ABNORMAL LOW (ref 12.0–15.0)
Immature Granulocytes: 0 %
Lymphocytes Relative: 15 %
Lymphs Abs: 1.4 10*3/uL (ref 0.7–4.0)
MCH: 26.9 pg (ref 26.0–34.0)
MCHC: 30.9 g/dL (ref 30.0–36.0)
MCV: 87.1 fL (ref 80.0–100.0)
Monocytes Absolute: 0.5 10*3/uL (ref 0.1–1.0)
Monocytes Relative: 5 %
Neutro Abs: 7.6 10*3/uL (ref 1.7–7.7)
Neutrophils Relative %: 80 %
Platelets: 242 10*3/uL (ref 150–400)
RBC: 3.64 MIL/uL — ABNORMAL LOW (ref 3.87–5.11)
RDW: 14 % (ref 11.5–15.5)
WBC: 9.6 10*3/uL (ref 4.0–10.5)
nRBC: 0 % (ref 0.0–0.2)

## 2018-04-10 LAB — GLUCOSE, CAPILLARY
Glucose-Capillary: 154 mg/dL — ABNORMAL HIGH (ref 70–99)
Glucose-Capillary: 154 mg/dL — ABNORMAL HIGH (ref 70–99)

## 2018-04-10 LAB — COMPREHENSIVE METABOLIC PANEL
ALT: 13 U/L (ref 0–44)
AST: 14 U/L — ABNORMAL LOW (ref 15–41)
Albumin: 2.9 g/dL — ABNORMAL LOW (ref 3.5–5.0)
Alkaline Phosphatase: 90 U/L (ref 38–126)
Anion gap: 8 (ref 5–15)
BUN: 10 mg/dL (ref 8–23)
CALCIUM: 8.4 mg/dL — AB (ref 8.9–10.3)
CO2: 24 mmol/L (ref 22–32)
Chloride: 107 mmol/L (ref 98–111)
Creatinine, Ser: 1.63 mg/dL — ABNORMAL HIGH (ref 0.44–1.00)
GFR, EST AFRICAN AMERICAN: 38 mL/min — AB (ref >60)
GFR, EST NON AFRICAN AMERICAN: 32 mL/min — AB (ref >60)
Glucose, Bld: 190 mg/dL — ABNORMAL HIGH (ref 70–99)
Potassium: 4.5 mmol/L (ref 3.5–5.1)
Sodium: 139 mmol/L (ref 135–145)
Total Bilirubin: 0.8 mg/dL (ref 0.3–1.2)
Total Protein: 6.5 g/dL (ref 6.5–8.1)

## 2018-04-10 LAB — URINALYSIS, ROUTINE W REFLEX MICROSCOPIC
BILIRUBIN URINE: NEGATIVE
Bacteria, UA: NONE SEEN
Glucose, UA: 50 mg/dL — AB
Ketones, ur: NEGATIVE mg/dL
Leukocytes, UA: NEGATIVE
Nitrite: NEGATIVE
Protein, ur: >=300 mg/dL — AB
SPECIFIC GRAVITY, URINE: 1.024 (ref 1.005–1.030)
pH: 7 (ref 5.0–8.0)

## 2018-04-10 LAB — CBG MONITORING, ED: GLUCOSE-CAPILLARY: 178 mg/dL — AB (ref 70–99)

## 2018-04-10 LAB — LIPASE, BLOOD: Lipase: 24 U/L (ref 11–51)

## 2018-04-10 MED ORDER — ENOXAPARIN SODIUM 40 MG/0.4ML ~~LOC~~ SOLN
40.0000 mg | SUBCUTANEOUS | Status: DC
  Administered 2018-04-10 – 2018-04-13 (×4): 40 mg via SUBCUTANEOUS
  Filled 2018-04-10 (×4): qty 0.4

## 2018-04-10 MED ORDER — IOHEXOL 300 MG/ML  SOLN
100.0000 mL | Freq: Once | INTRAMUSCULAR | Status: AC
  Administered 2018-04-10: 80 mL via INTRAVENOUS

## 2018-04-10 MED ORDER — PANTOPRAZOLE SODIUM 40 MG IV SOLR
40.0000 mg | Freq: Once | INTRAVENOUS | Status: AC
  Administered 2018-04-10: 40 mg via INTRAVENOUS
  Filled 2018-04-10: qty 40

## 2018-04-10 MED ORDER — FENTANYL CITRATE (PF) 100 MCG/2ML IJ SOLN
50.0000 ug | Freq: Once | INTRAMUSCULAR | Status: AC
  Administered 2018-04-10: 50 ug via INTRAVENOUS
  Filled 2018-04-10: qty 2

## 2018-04-10 MED ORDER — HYDROMORPHONE HCL 1 MG/ML IJ SOLN
0.5000 mg | INTRAMUSCULAR | Status: DC | PRN
  Administered 2018-04-10 – 2018-04-13 (×7): 0.5 mg via INTRAVENOUS
  Filled 2018-04-10 (×7): qty 1

## 2018-04-10 MED ORDER — ACETAMINOPHEN 325 MG PO TABS: 650.0000 mg | ORAL_TABLET | Freq: Four times a day (QID) | ORAL | Status: DC | PRN

## 2018-04-10 MED ORDER — LACTATED RINGERS IV BOLUS
1000.0000 mL | Freq: Once | INTRAVENOUS | Status: AC
  Administered 2018-04-10: 1000 mL via INTRAVENOUS

## 2018-04-10 MED ORDER — ONDANSETRON HCL 4 MG/2ML IJ SOLN
4.0000 mg | Freq: Once | INTRAMUSCULAR | Status: AC
  Administered 2018-04-10: 4 mg via INTRAVENOUS
  Filled 2018-04-10: qty 2

## 2018-04-10 MED ORDER — SODIUM CHLORIDE 0.9 % IV SOLN
INTRAVENOUS | Status: DC
  Administered 2018-04-10 – 2018-04-13 (×6): via INTRAVENOUS

## 2018-04-10 MED ORDER — LORATADINE 10 MG PO TABS
10.0000 mg | ORAL_TABLET | Freq: Every day | ORAL | Status: DC
  Administered 2018-04-10 – 2018-04-14 (×4): 10 mg via ORAL
  Filled 2018-04-10 (×4): qty 1

## 2018-04-10 MED ORDER — ONDANSETRON HCL 4 MG/2ML IJ SOLN
4.0000 mg | Freq: Four times a day (QID) | INTRAMUSCULAR | Status: DC | PRN
  Administered 2018-04-10: 4 mg via INTRAVENOUS
  Filled 2018-04-10: qty 2

## 2018-04-10 MED ORDER — HYDRALAZINE HCL 20 MG/ML IJ SOLN
5.0000 mg | Freq: Four times a day (QID) | INTRAMUSCULAR | Status: DC | PRN
  Administered 2018-04-10: 5 mg via INTRAVENOUS
  Filled 2018-04-10 (×2): qty 1

## 2018-04-10 MED ORDER — ONDANSETRON HCL 4 MG PO TABS: 4.0000 mg | ORAL_TABLET | Freq: Four times a day (QID) | ORAL | Status: DC | PRN

## 2018-04-10 MED ORDER — OXYCODONE-ACETAMINOPHEN 5-325 MG PO TABS
1.0000 | ORAL_TABLET | Freq: Four times a day (QID) | ORAL | Status: DC | PRN
  Administered 2018-04-11 – 2018-04-14 (×6): 2 via ORAL
  Filled 2018-04-10 (×6): qty 2

## 2018-04-10 MED ORDER — ACETAMINOPHEN 650 MG RE SUPP: 650.0000 mg | Freq: Four times a day (QID) | RECTAL | Status: DC | PRN

## 2018-04-10 MED ORDER — GABAPENTIN 300 MG PO CAPS
300.0000 mg | ORAL_CAPSULE | Freq: Three times a day (TID) | ORAL | Status: DC
  Administered 2018-04-10 – 2018-04-14 (×10): 300 mg via ORAL
  Filled 2018-04-10 (×10): qty 1

## 2018-04-10 MED ORDER — SODIUM CHLORIDE 0.9 % IV SOLN
1.0000 g | Freq: Once | INTRAVENOUS | Status: AC
  Administered 2018-04-10: 1 g via INTRAVENOUS
  Filled 2018-04-10: qty 10

## 2018-04-10 MED ORDER — HYDROCHLOROTHIAZIDE 25 MG PO TABS
25.0000 mg | ORAL_TABLET | Freq: Every day | ORAL | Status: DC
  Administered 2018-04-10: 25 mg via ORAL
  Filled 2018-04-10: qty 1

## 2018-04-10 MED ORDER — INSULIN ASPART 100 UNIT/ML ~~LOC~~ SOLN
0.0000 [IU] | Freq: Three times a day (TID) | SUBCUTANEOUS | Status: DC
  Administered 2018-04-10 – 2018-04-12 (×4): 2 [IU] via SUBCUTANEOUS
  Administered 2018-04-12: 1 [IU] via SUBCUTANEOUS
  Administered 2018-04-13: 2 [IU] via SUBCUTANEOUS
  Administered 2018-04-14: 1 [IU] via SUBCUTANEOUS
  Administered 2018-04-14: 2 [IU] via SUBCUTANEOUS

## 2018-04-10 MED ORDER — ALBUTEROL SULFATE (2.5 MG/3ML) 0.083% IN NEBU: 2.5000 mg | INHALATION_SOLUTION | Freq: Four times a day (QID) | RESPIRATORY_TRACT | Status: DC | PRN

## 2018-04-10 MED ORDER — POLYETHYLENE GLYCOL 3350 17 G PO PACK: 17.0000 g | PACK | Freq: Every day | ORAL | Status: DC | PRN

## 2018-04-10 MED ORDER — SODIUM CHLORIDE 0.9 % IV SOLN: 1.0000 g | INTRAVENOUS | Status: DC

## 2018-04-10 MED ORDER — MORPHINE SULFATE (PF) 4 MG/ML IV SOLN
4.0000 mg | INTRAVENOUS | Status: DC | PRN
  Administered 2018-04-10: 4 mg via INTRAVENOUS
  Filled 2018-04-10: qty 1

## 2018-04-10 MED ORDER — SODIUM CHLORIDE 0.9 % IV SOLN
1.0000 g | INTRAVENOUS | Status: DC
  Administered 2018-04-11 – 2018-04-14 (×4): 1 g via INTRAVENOUS
  Filled 2018-04-10 (×4): qty 10

## 2018-04-10 MED ORDER — ATORVASTATIN CALCIUM 40 MG PO TABS
40.0000 mg | ORAL_TABLET | Freq: Every day | ORAL | Status: DC
  Administered 2018-04-10 – 2018-04-12 (×3): 40 mg via ORAL
  Filled 2018-04-10 (×5): qty 1

## 2018-04-10 NOTE — ED Notes (Signed)
Patient transported to CT 
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
## 2018-04-10 NOTE — ED Notes (Signed)
Patient reports that she has not taken her antihypertensive meds for "a long time," when asked to specify she states "months."

## 2018-04-10 NOTE — ED Provider Notes (Signed)
[facility] EMERGENCY DEPARTMENT Provider Note   CSN: [id number] Arrival date & time: 04/10/18  4193     History   Chief Complaint Chief Complaint  Patient presents with  . Abdominal Pain  . Nausea    HPI Zoe Clarke is a 68 y.o. female.  The history is provided by the patient.  Abdominal Pain  Pain location:  LUQ, RUQ and epigastric Pain quality: aching and dull   Pain radiates to:  Does not radiate Pain severity:  Moderate Onset quality:  Gradual Timing:  Constant Progression:  Unchanged Chronicity:  New Context: not recent illness, not sick contacts, not suspicious food intake and not trauma   Context comment:  Hx of ulcers, no melena or bloody stools. Some n/v. Relieved by:  Nothing Worsened by:  Nothing Associated symptoms: nausea and vomiting   Associated symptoms: no anorexia, no belching, no chest pain, no chills, no constipation, no cough, no dysuria, no fever, no hematemesis, no hematuria, no shortness of breath, no sore throat, no vaginal bleeding and no vaginal discharge   Risk factors: has not had multiple surgeries     Past Medical History:  Diagnosis Date  . Allergy   . Arthritis   . Asthma   . Diabetes mellitus without complication ([place])   . Gastric ulcer   . GERD (gastroesophageal reflux disease)   . Hypertension   . Osteoporosis     Patient Active Problem List   Diagnosis Date Noted  . Acute pyelonephritis 04/10/2018  . Leukocytosis 09/10/2017  . Anemia 09/10/2017  . Knee pain, bilateral 01/13/2015  . Bilateral hip pain 11/23/2014  . Diabetes mellitus type 2, uncontrolled, with complications ([place]) 79/04/4095  . Intertrigo 10/25/2014  . GERD (gastroesophageal reflux disease) 10/25/2014  . Diabetic neuropathy ([place]) 10/25/2014  . Chronic low back pain 10/25/2014  . Current tobacco use 11/11/2013  . Stage 3 chronic kidney disease due to type 2 diabetes mellitus ([place]) 09/02/2013  . Essential (primary) hypertension  09/02/2013  . Gout 12/05/2012    Past Surgical History:  Procedure Laterality Date  . TUBAL LIGATION       OB History   No obstetric history on file.      Home Medications    Prior to Admission medications   Medication Sig Start Date End Date Taking? Authorizing Provider  albuterol (PROVENTIL HFA;VENTOLIN HFA) 108 (90 BASE) MCG/ACT inhaler Inhale 2 puffs into the lungs every 6 (six) hours as needed for wheezing or shortness of breath. 01/13/15  Yes Weber, Sarah L, PA-C  atorvastatin (LIPITOR) 40 MG tablet Take 40 mg by mouth daily.   Yes [provider]  cyclobenzaprine (FLEXERIL) 5 MG tablet Take 1 tablet (5 mg total) by mouth at bedtime. 08/25/15  Yes Weber, Sarah L, PA-C  diphenhydramine-acetaminophen (TYLENOL PM EXTRA STRENGTH) 25-500 MG TABS tablet Take 1 tablet by mouth at bedtime as needed (pain).    Yes [provider]  fexofenadine (ALLEGRA) 180 MG tablet Take 180 mg by mouth daily.   Yes [provider]  gabapentin (NEURONTIN) 300 MG capsule Take 3 capsules (900 mg total) by mouth 3 (three) times daily. 08/25/15  Yes Weber, Sarah L, PA-C  glipiZIDE (GLUCOTROL) 10 MG tablet Take 1 tablet (10 mg total) by mouth 2 (two) times daily before a meal. 08/27/15  Yes Weber, Damaris Hippo, PA-C  linaclotide (LINZESS) 145 MCG CAPS capsule Take 145 mcg by mouth as needed (constipation).   Yes [provider]  Phenyleph-CPM-DM-Aspirin [place] PLUS  COLD & COUGH PO) Take 1 tablet by mouth as needed (cold).    Yes [provider]  predniSONE (DELTASONE) 20 MG tablet 2 tabs po daily x 4 days Patient not taking: Reported on 09/10/2017 08/30/16   Deno Etienne, DO  ranitidine (ZANTAC) 150 MG tablet TAKE 1 TABLET (150 MG TOTAL) BY MOUTH 2 (TWO) TIMES DAILY. Patient not taking: No sig reported 06/01/15   Gale Journey, Damaris Hippo, PA-C  triamcinolone cream (KENALOG) 0.1 % Apply 1 application topically 2 (two) times daily. Patient not taking: Reported on 04/10/2018 08/25/15    Mancel Bale, PA-C    Family History Family History  Problem Relation Age of Onset  . Arthritis Mother   . Heart disease Mother   . Hypertension Mother   . Arthritis Maternal Grandmother   . Hypertension Maternal Grandmother   . Stroke Maternal Grandfather   . Cancer Sister        unknown type cancer  . Colon cancer Neg Hx   . Esophageal cancer Neg Hx   . Stomach cancer Neg Hx   . Rectal cancer Neg Hx     Social History Social History   Tobacco Use  . Smoking status: Current Every Day Smoker    Packs/day: 0.50    Years: 39.00    Pack years: 19.50    Types: Cigarettes  . Smokeless tobacco: Never Used  . Tobacco comment: she used to smoke 2 packs a day   Substance Use Topics  . Alcohol use: No    Alcohol/week: 0.0 standard drinks  . Drug use: Yes    Types: Marijuana     Allergies   Aspirin; Allopurinol; Amlodipine; and Lisinopril   Review of Systems Review of Systems  Constitutional: Negative for chills and fever.  HENT: Negative for ear pain and sore throat.   Eyes: Negative for pain and visual disturbance.  Respiratory: Negative for cough and shortness of breath.   Cardiovascular: Negative for chest pain and palpitations.  Gastrointestinal: Positive for abdominal pain, nausea and vomiting. Negative for anorexia, constipation and hematemesis.  Genitourinary: Negative for dysuria, hematuria, vaginal bleeding and vaginal discharge.  Musculoskeletal: Negative for arthralgias and back pain.  Skin: Negative for color change and rash.  Neurological: Negative for seizures and syncope.  All other systems reviewed and are negative.    Physical Exam Updated Vital Signs BP (!) 211/66   Pulse (!) 56   Temp 98.9 F (37.2 C) (Oral)   Resp (!) 6   SpO2 92%   Physical Exam Vitals signs and nursing note reviewed.  Constitutional:      General: She is not in acute distress.    Appearance: She is well-developed.  HENT:     Head: Normocephalic and atraumatic.    Eyes:     Conjunctiva/sclera: Conjunctivae normal.  Neck:     Musculoskeletal: Neck supple.  Cardiovascular:     Rate and Rhythm: Normal rate and regular rhythm.     Heart sounds: Normal heart sounds. No murmur.  Pulmonary:     Effort: Pulmonary effort is normal. No respiratory distress.     Breath sounds: Normal breath sounds.  Abdominal:     Palpations: Abdomen is soft.     Tenderness: There is abdominal tenderness in the epigastric area and left upper quadrant. There is right CVA tenderness. There is no left CVA tenderness, guarding or rebound. Negative signs include Murphy's sign.     Hernia: No hernia is present.  Skin:    General:  Skin is warm and dry.     Capillary Refill: Capillary refill takes less than 2 seconds.  Neurological:     Mental Status: She is alert.      ED Treatments / Results  Labs (all labs ordered are listed, but only abnormal results are displayed) Labs Reviewed  COMPREHENSIVE METABOLIC PANEL - Abnormal; Notable for the following components:      Result Value   Glucose, Bld 190 (*)    Creatinine, Ser 1.63 (*)    Calcium 8.4 (*)    Albumin 2.9 (*)    AST 14 (*)    GFR calc non Af Amer 32 (*)    GFR calc Af Amer 38 (*)    All other components within normal limits  CBC WITH DIFFERENTIAL/PLATELET - Abnormal; Notable for the following components:   RBC 3.64 (*)    Hemoglobin 9.8 (*)    HCT 31.7 (*)    All other components within normal limits  URINALYSIS, ROUTINE W REFLEX MICROSCOPIC - Abnormal; Notable for the following components:   Glucose, UA 50 (*)    Hgb urine dipstick SMALL (*)    Protein, ur >=300 (*)    All other components within normal limits  CBG MONITORING, ED - Abnormal; Notable for the following components:   Glucose-Capillary 178 (*)    All other components within normal limits  URINE CULTURE  LIPASE, BLOOD    EKG EKG Interpretation  Date/Time:  Thursday April 10 2018 08:44:16 EST Ventricular Rate:  64 PR Interval:     QRS Duration: 81 QT Interval:  484 QTC Calculation: 500 R Axis:   45 Text Interpretation:  Sinus rhythm Abnormal R-wave progression, early transition Borderline prolonged QT interval Confirmed by Lennice Sites [phone]) on 04/10/2018 9:49:55 AM   Radiology Ct Abdomen Pelvis W Contrast  Result Date: 04/10/2018 CLINICAL DATA:  Right-sided abdominal pain for 2 days EXAM: CT ABDOMEN AND PELVIS WITH CONTRAST TECHNIQUE: Multidetector CT imaging of the abdomen and pelvis was performed using the standard protocol following bolus administration of intravenous contrast. CONTRAST:  27mL OMNIPAQUE 300 COMPARISON:  04/16/2009 FINDINGS: Lower chest: Lung bases demonstrate multiple calcified granulomas on the left. Minimal dependent atelectatic changes are noted. Hepatobiliary: Liver is well visualized and within normal limits. The gallbladder is well distended with dependent density consistent with multiple small gallstones. No ductal dilatation is seen. Pancreas: Unremarkable. No pancreatic ductal dilatation or surrounding inflammatory changes. Spleen: Normal in size without focal abnormality. Adrenals/Urinary Tract: Adrenal glands are within normal limits bilaterally. Kidneys are well visualized without focal mass lesion or hydronephrosis. Some decreased attenuation is noted in the upper pole of the right kidney posteriorly which may represent some focal pyelonephritis. No obstructive changes are noted. The bladder is well distended. Stomach/Bowel: Scattered diverticular change of the colon is noted. No findings to suggest diverticulitis are seen. The appendix is within normal limits. No small bowel or gastric abnormality is seen. Vascular/Lymphatic: Aortic atherosclerosis. No enlarged abdominal or pelvic lymph nodes. Reproductive: Uterus and bilateral adnexa are unremarkable. Other: No abdominal wall hernia or abnormality. No abdominopelvic ascites. Musculoskeletal: Degenerative changes of lumbar spine are noted.  IMPRESSION: Changes suggestive of right-sided focal pyelonephritis. Changes of prior granulomatous disease. Cholelithiasis without complicating factors. Diverticulosis without diverticulitis. Electronically Signed   By: Inez Catalina M.D.   On: 04/10/2018 11:10    Procedures Procedures (including critical care time)  Medications Ordered in ED Medications  hydrALAZINE (APRESOLINE) injection 5 mg (has no administration in time range)  morphine  4 MG/ML injection 4 mg (has no administration in time range)  oxyCODONE-acetaminophen (PERCOCET/ROXICET) 5-325 MG per tablet 1-2 tablet (has no administration in time range)  hydrochlorothiazide (HYDRODIURIL) tablet 25 mg (has no administration in time range)  lactated ringers bolus 1,000 mL (0 mLs Intravenous Stopped 04/10/18 1023)  pantoprazole (PROTONIX) injection 40 mg (40 mg Intravenous Given 04/10/18 0858)  ondansetron (ZOFRAN) injection 4 mg (4 mg Intravenous Given 04/10/18 0858)  fentaNYL (SUBLIMAZE) injection 50 mcg (50 mcg Intravenous Given 04/10/18 0857)  fentaNYL (SUBLIMAZE) injection 50 mcg (50 mcg Intravenous Given 04/10/18 1024)  iohexol (OMNIPAQUE) 300 MG/ML solution 100 mL (80 mLs Intravenous Contrast Given 04/10/18 1040)  cefTRIAXone (ROCEPHIN) 1 g in sodium chloride 0.9 % 100 mL IVPB (0 g Intravenous Stopped 04/10/18 1306)  fentaNYL (SUBLIMAZE) injection 50 mcg (50 mcg Intravenous Given 04/10/18 1335)  ondansetron (ZOFRAN) injection 4 mg (4 mg Intravenous Given 04/10/18 1335)     Initial Impression / Assessment and Plan / ED Course  I have reviewed the triage vital signs and the nursing notes.  Pertinent labs & imaging results that were available during my care of the patient were reviewed by me and considered in my medical decision making (see chart for details).     Zoe Clarke is a 68 year old female with history of diabetes, reflux, gastric ulcer, hypertension who presents to the ED with nausea, vomiting, abdominal pain.   Patient with hypertension upon arrival but otherwise normal vitals.  EKG unremarkable.  Doubt cardiac process.  No chest pain.  Patient with tenderness in the epigastric region, right upper quadrant region, right flank.  No history of kidney stones.  Possible urinary symptoms.  Concern for kidney stone versus gallbladder/liver pathology, pyelonephritis, ulcer.  Patient denies any melena, hematochezia.  Will give IV fluid bolus, IV fentanyl, IV Zofran.  Patient with no significant leukocytosis, anemia, electrolyte abnormality.  Gallbladder and liver enzymes within normal limits.  Urinalysis with no signs of infection.  CT scan however show signs suggestive of pyelonephritis.  Patient does have some gallstones but no abnormalities on lab work or no concerning features on CT scan.  Patient still with nausea, vomiting, pain.  Given additional dose of IV Zofran, IV fentanyl without much improvement.  Empirically treated for kidney infection with IV Rocephin.  Urine culture sent for.  Right upper quadrant ultrasound ordered.  Possible biliary colic versus pyelonephritis versus ulcer disease.  Admitted to hospitalist service for further symptomatic care and further work-up.  Right upper quadrant ultrasound pending and ordered at time of admission to medicine.  May benefit from surgical consultation if does not get symptomatic improvement.  This chart was dictated using voice recognition software.  Despite best efforts to proofread,  errors can occur which can change the documentation meaning.   Final Clinical Impressions(s) / ED Diagnoses   Final diagnoses:  RUQ pain  Intractable nausea and vomiting  Acute cystitis without hematuria    ED Discharge Orders    None       Lennice Sites, DO 04/10/18 1439

## 2018-04-10 NOTE — ED Triage Notes (Signed)
Pt arrives to ED from home with complaints of  RUQ abdominal pain that radiates to her right flank. Pt also states it burns when she pees. EMS gave 4mg  zofran and 140mcg fentanyl. Marland Kitchen

## 2018-04-10 NOTE — H&P (Signed)
History and Physical    Zoe Clarke CBS:[id number] DOB: 12/03/1950 DOA: 04/10/2018  PCP: Antony Contras, MD Patient coming from: Home  Chief Complaint: Flank pain  HPI: Zoe Clarke is a 68 y.o. female with medical history significant of hypertension, asthma, diabetes, GERD.  Patient presented secondary to right-sided flank pain that started last night.  Pain radiates from right upper quadrant of right flank, a sharp, and persistent.  Pain has worsened since last night.  She took some Tylenol p.m. which did not really help with her pain.  Her symptoms have been complicated by nausea and vomiting.  She has not been able to keep anything down since yesterday evening.  She also reports a history of dysuria prior to her right flank pain.  ED Course: Vitals: Afebrile, normal pulse, normal respirations, significantly elevated blood pressure 211/66 Labs: Glucose of 190, creatinine of 1.63, albumin of 2.9,  hemoglobin of 9.8, hemoglobin, urinalysis significant for proteinuria Imaging: CT abdomen pelvis significant for right-sided focal pyelonephritis, cholelithiasis, diverticulosis. Medications/Course: Patient received fentanyl 50 mcg injections 3 times, Zofran 4 mg twice, Protonix 40 mg once.  She also received ceftriaxone and a 1 L bolus of lactated Ringer's  Review of Systems: Review of Systems  Constitutional: Negative for chills and fever.  Respiratory: Negative for shortness of breath.   Cardiovascular: Negative for chest pain.  Gastrointestinal: Positive for abdominal pain, nausea and vomiting. Negative for constipation and diarrhea.  Genitourinary: Positive for dysuria and flank pain.  All other systems reviewed and are negative.   Past Medical History:  Diagnosis Date  . Allergy   . Arthritis   . Asthma   . Diabetes mellitus without complication ([place])   . Gastric ulcer   . GERD (gastroesophageal reflux disease)   . Hypertension   . Osteoporosis     Past Surgical  History:  Procedure Laterality Date  . TUBAL LIGATION       reports that she has been smoking cigarettes. She has a 19.50 pack-year smoking history. She has never used smokeless tobacco. She reports current drug use. Drug: Marijuana. She reports that she does not drink alcohol.  Allergies  Allergen Reactions  . Aspirin Other (See Comments)    Feels funny   . Allopurinol     Dizzy; hard to ambulate  Couldn't see; sweating  . Amlodipine Other (See Comments)    headache  . Lisinopril Swelling    Face swelling    Family History  Problem Relation Age of Onset  . Arthritis Mother   . Heart disease Mother   . Hypertension Mother   . Arthritis Maternal Grandmother   . Hypertension Maternal Grandmother   . Stroke Maternal Grandfather   . Cancer Sister        unknown type cancer  . Colon cancer Neg Hx   . Esophageal cancer Neg Hx   . Stomach cancer Neg Hx   . Rectal cancer Neg Hx    Prior to Admission medications   Medication Sig Start Date End Date Taking? Authorizing Provider  albuterol (PROVENTIL HFA;VENTOLIN HFA) 108 (90 BASE) MCG/ACT inhaler Inhale 2 puffs into the lungs every 6 (six) hours as needed for wheezing or shortness of breath. 01/13/15  Yes Weber, Sarah L, PA-C  atorvastatin (LIPITOR) 40 MG tablet Take 40 mg by mouth daily.   Yes [provider]  cyclobenzaprine (FLEXERIL) 5 MG tablet Take 1 tablet (5 mg total) by mouth at bedtime. 08/25/15  Yes Weber, Damaris Hippo, PA-C  diphenhydramine-acetaminophen (  TYLENOL PM EXTRA STRENGTH) 25-500 MG TABS tablet Take 1 tablet by mouth at bedtime as needed (pain).    Yes [provider]  fexofenadine (ALLEGRA) 180 MG tablet Take 180 mg by mouth daily.   Yes [provider]  gabapentin (NEURONTIN) 300 MG capsule Take 3 capsules (900 mg total) by mouth 3 (three) times daily. 08/25/15  Yes Weber, Sarah L, PA-C  glipiZIDE (GLUCOTROL) 10 MG tablet Take 1 tablet (10 mg total) by mouth 2 (two) times daily before a meal.  08/27/15  Yes Weber, Damaris Hippo, PA-C  linaclotide (LINZESS) 145 MCG CAPS capsule Take 145 mcg by mouth as needed (constipation).   Yes [provider]  Phenyleph-CPM-DM-Aspirin (ALKA-SELTZER PLUS COLD & COUGH PO) Take 1 tablet by mouth as needed (cold).    Yes [provider]  predniSONE (DELTASONE) 20 MG tablet 2 tabs po daily x 4 days Patient not taking: Reported on 09/10/2017 08/30/16   Deno Etienne, DO  ranitidine (ZANTAC) 150 MG tablet TAKE 1 TABLET (150 MG TOTAL) BY MOUTH 2 (TWO) TIMES DAILY. Patient not taking: No sig reported 06/01/15   Gale Journey, Damaris Hippo, PA-C  triamcinolone cream (KENALOG) 0.1 % Apply 1 application topically 2 (two) times daily. Patient not taking: Reported on 04/10/2018 08/25/15   Mancel Bale, PA-C    Physical Exam:  Physical Exam Vitals signs reviewed.  Constitutional:      General: She is not in acute distress.    Appearance: She is well-developed. She is not diaphoretic.  Eyes:     Conjunctiva/sclera: Conjunctivae normal.     Pupils: Pupils are equal.  Neck:     Musculoskeletal: Normal range of motion.  Cardiovascular:     Rate and Rhythm: Normal rate and regular rhythm.     Heart sounds: Normal heart sounds. No murmur.  Pulmonary:     Effort: Pulmonary effort is normal. No respiratory distress.     Breath sounds: Normal breath sounds. No wheezing or rales.  Abdominal:     General: Bowel sounds are normal. There is no distension.     Palpations: Abdomen is soft.     Tenderness: There is abdominal tenderness in the right upper quadrant. There is right CVA tenderness and guarding. There is no left CVA tenderness or rebound.  Musculoskeletal: Normal range of motion.        General: No tenderness.  Lymphadenopathy:     Cervical: No cervical adenopathy.  Skin:    General: Skin is warm and dry.  Neurological:     Mental Status: She is alert and oriented to person, place, and time.      Labs on Admission: I have personally reviewed following  labs and imaging studies  CBC: Recent Labs  Lab 04/10/18 0847  WBC 9.6  NEUTROABS 7.6  HGB 9.8*  HCT 31.7*  MCV 87.1  PLT 209    Basic Metabolic Panel: Recent Labs  Lab 04/10/18 0847  NA 139  K 4.5  CL 107  CO2 24  GLUCOSE 190*  BUN 10  CREATININE 1.63*  CALCIUM 8.4*    GFR: CrCl cannot be calculated (Unknown ideal weight.).  Liver Function Tests: Recent Labs  Lab 04/10/18 0847  AST 14*  ALT 13  ALKPHOS 90  BILITOT 0.8  PROT 6.5  ALBUMIN 2.9*   Recent Labs  Lab 04/10/18 0847  LIPASE 24   No results for input(s): AMMONIA in the last 168 hours.  Coagulation Profile: No results for input(s): INR, PROTIME in the  last 168 hours.  Cardiac Enzymes: No results for input(s): CKTOTAL, CKMB, CKMBINDEX, TROPONINI in the last 168 hours.  BNP (last 3 results) No results for input(s): PROBNP in the last 8760 hours.  HbA1C: No results for input(s): HGBA1C in the last 72 hours.  CBG: Recent Labs  Lab 04/10/18 0920  GLUCAP 178*    Lipid Profile: No results for input(s): CHOL, HDL, LDLCALC, TRIG, CHOLHDL, LDLDIRECT in the last 72 hours.  Thyroid Function Tests: No results for input(s): TSH, T4TOTAL, FREET4, T3FREE, THYROIDAB in the last 72 hours.  Anemia Panel: No results for input(s): VITAMINB12, FOLATE, FERRITIN, TIBC, IRON, RETICCTPCT in the last 72 hours.  Urine analysis:    Component Value Date/Time   COLORURINE YELLOW 04/10/2018 1220   APPEARANCEUR CLEAR 04/10/2018 1220   LABSPEC 1.024 04/10/2018 1220   PHURINE 7.0 04/10/2018 1220   GLUCOSEU 50 (A) 04/10/2018 1220   HGBUR SMALL (A) 04/10/2018 1220   BILIRUBINUR NEGATIVE 04/10/2018 1220   BILIRUBINUR negative 02/25/2015 1426   KETONESUR NEGATIVE 04/10/2018 1220   PROTEINUR >=300 (A) 04/10/2018 1220   UROBILINOGEN 0.2 02/25/2015 1426   UROBILINOGEN 1.0 04/16/2009 0943   NITRITE NEGATIVE 04/10/2018 1220   LEUKOCYTESUR NEGATIVE 04/10/2018 1220     Radiological Exams on Admission: Ct  Abdomen Pelvis W Contrast  Result Date: 04/10/2018 CLINICAL DATA:  Right-sided abdominal pain for 2 days EXAM: CT ABDOMEN AND PELVIS WITH CONTRAST TECHNIQUE: Multidetector CT imaging of the abdomen and pelvis was performed using the standard protocol following bolus administration of intravenous contrast. CONTRAST:  4mL OMNIPAQUE 300 COMPARISON:  04/16/2009 FINDINGS: Lower chest: Lung bases demonstrate multiple calcified granulomas on the left. Minimal dependent atelectatic changes are noted. Hepatobiliary: Liver is well visualized and within normal limits. The gallbladder is well distended with dependent density consistent with multiple small gallstones. No ductal dilatation is seen. Pancreas: Unremarkable. No pancreatic ductal dilatation or surrounding inflammatory changes. Spleen: Normal in size without focal abnormality. Adrenals/Urinary Tract: Adrenal glands are within normal limits bilaterally. Kidneys are well visualized without focal mass lesion or hydronephrosis. Some decreased attenuation is noted in the upper pole of the right kidney posteriorly which may represent some focal pyelonephritis. No obstructive changes are noted. The bladder is well distended. Stomach/Bowel: Scattered diverticular change of the colon is noted. No findings to suggest diverticulitis are seen. The appendix is within normal limits. No small bowel or gastric abnormality is seen. Vascular/Lymphatic: Aortic atherosclerosis. No enlarged abdominal or pelvic lymph nodes. Reproductive: Uterus and bilateral adnexa are unremarkable. Other: No abdominal wall hernia or abnormality. No abdominopelvic ascites. Musculoskeletal: Degenerative changes of lumbar spine are noted. IMPRESSION: Changes suggestive of right-sided focal pyelonephritis. Changes of prior granulomatous disease. Cholelithiasis without complicating factors. Diverticulosis without diverticulitis. Electronically Signed   By: Inez Catalina M.D.   On: 04/10/2018 11:10    EKG:  Independently reviewed. Sinus rhythm, borderline prolonged QTC  Assessment/Plan Active Problems:   Acute pyelonephritis  Acute pyelonephritis Based on CT results and history. Urinalysis suggests no infection. Urine culture obtained and is pending.  No blood cultures obtained.  No fevers, white count, tachycardia. -Continue Ceftriaxone -Urine culture results -Clear liquid diet -Zofran PRN for nausea and vomiting in addition to morphine IV for pain -NS IV fluids @ 100 ml/hr she talked about  Diabetes mellitus, type II Last hemoglobin A1c from 2017 of 8.7% per chart review.  Patient follows with [facility] physicians.  Currently on glipizide as an outpatient. -Sliding scale insulin patient  Hypertensive urgency Was previously on antihypertensives  but stated that they gave her headache.  She was on amlodipine at that time.  Currently does not take any antihypertensives -Start HCTZ 25 mg daily -Hydralazine 5 mg IV as needed  CKD stage III Appears to be stable. -Renally dose medication  Diabetic neuropathy Patient is on gabapentin 900 mg 3 times daily as an outpatient.  Patient with history of CKD with current GFR restricting current gabapentin dose -Reduce to gabapentin 300 mg 3 times daily  Hyperlipidemia -Continue Lipitor 40 mg daily  Anemia Chronic.  Currently stable.  Unknown etiology.  Outpatient management.  Borderline prolonged QTc -Repeat EKG in AM   DVT prophylaxis: Lovenox Code Status: Full code Family Communication: None at bedside Disposition Plan: Discharge pending culture results and improvement of nausea and vomiting Consults called: None Admission status: Observation   Cordelia Poche, MD [facility] 04/10/2018, 2:35 PM  If 7PM-7AM, please contact night-coverage [email or web address] Password [email or web address]

## 2018-04-11 ENCOUNTER — Other Ambulatory Visit: Payer: Self-pay

## 2018-04-11 ENCOUNTER — Observation Stay (HOSPITAL_COMMUNITY): Payer: PPO

## 2018-04-11 DIAGNOSIS — F17210 Nicotine dependence, cigarettes, uncomplicated: Secondary | ICD-10-CM | POA: Diagnosis present

## 2018-04-11 DIAGNOSIS — K8020 Calculus of gallbladder without cholecystitis without obstruction: Secondary | ICD-10-CM | POA: Diagnosis present

## 2018-04-11 DIAGNOSIS — Z888 Allergy status to other drugs, medicaments and biological substances status: Secondary | ICD-10-CM | POA: Diagnosis not present

## 2018-04-11 DIAGNOSIS — J45909 Unspecified asthma, uncomplicated: Secondary | ICD-10-CM | POA: Diagnosis present

## 2018-04-11 DIAGNOSIS — E118 Type 2 diabetes mellitus with unspecified complications: Secondary | ICD-10-CM | POA: Diagnosis not present

## 2018-04-11 DIAGNOSIS — D631 Anemia in chronic kidney disease: Secondary | ICD-10-CM | POA: Diagnosis present

## 2018-04-11 DIAGNOSIS — M1990 Unspecified osteoarthritis, unspecified site: Secondary | ICD-10-CM | POA: Diagnosis present

## 2018-04-11 DIAGNOSIS — M810 Age-related osteoporosis without current pathological fracture: Secondary | ICD-10-CM | POA: Diagnosis present

## 2018-04-11 DIAGNOSIS — R1011 Right upper quadrant pain: Secondary | ICD-10-CM | POA: Diagnosis present

## 2018-04-11 DIAGNOSIS — Z8261 Family history of arthritis: Secondary | ICD-10-CM | POA: Diagnosis not present

## 2018-04-11 DIAGNOSIS — Z809 Family history of malignant neoplasm, unspecified: Secondary | ICD-10-CM | POA: Diagnosis not present

## 2018-04-11 DIAGNOSIS — Z886 Allergy status to analgesic agent status: Secondary | ICD-10-CM | POA: Diagnosis not present

## 2018-04-11 DIAGNOSIS — Z7984 Long term (current) use of oral hypoglycemic drugs: Secondary | ICD-10-CM | POA: Diagnosis not present

## 2018-04-11 DIAGNOSIS — R112 Nausea with vomiting, unspecified: Secondary | ICD-10-CM | POA: Diagnosis not present

## 2018-04-11 DIAGNOSIS — E1122 Type 2 diabetes mellitus with diabetic chronic kidney disease: Secondary | ICD-10-CM | POA: Diagnosis present

## 2018-04-11 DIAGNOSIS — N3000 Acute cystitis without hematuria: Secondary | ICD-10-CM | POA: Diagnosis present

## 2018-04-11 DIAGNOSIS — E785 Hyperlipidemia, unspecified: Secondary | ICD-10-CM | POA: Diagnosis present

## 2018-04-11 DIAGNOSIS — I160 Hypertensive urgency: Secondary | ICD-10-CM | POA: Diagnosis present

## 2018-04-11 DIAGNOSIS — E1149 Type 2 diabetes mellitus with other diabetic neurological complication: Secondary | ICD-10-CM | POA: Diagnosis not present

## 2018-04-11 DIAGNOSIS — K219 Gastro-esophageal reflux disease without esophagitis: Secondary | ICD-10-CM | POA: Diagnosis present

## 2018-04-11 DIAGNOSIS — Z6839 Body mass index (BMI) 39.0-39.9, adult: Secondary | ICD-10-CM | POA: Diagnosis not present

## 2018-04-11 DIAGNOSIS — Z8249 Family history of ischemic heart disease and other diseases of the circulatory system: Secondary | ICD-10-CM | POA: Diagnosis not present

## 2018-04-11 DIAGNOSIS — I129 Hypertensive chronic kidney disease with stage 1 through stage 4 chronic kidney disease, or unspecified chronic kidney disease: Secondary | ICD-10-CM | POA: Diagnosis present

## 2018-04-11 DIAGNOSIS — N183 Chronic kidney disease, stage 3 (moderate): Secondary | ICD-10-CM | POA: Diagnosis present

## 2018-04-11 DIAGNOSIS — Z823 Family history of stroke: Secondary | ICD-10-CM | POA: Diagnosis not present

## 2018-04-11 DIAGNOSIS — E1140 Type 2 diabetes mellitus with diabetic neuropathy, unspecified: Secondary | ICD-10-CM | POA: Diagnosis present

## 2018-04-11 DIAGNOSIS — N10 Acute tubulo-interstitial nephritis: Secondary | ICD-10-CM | POA: Diagnosis present

## 2018-04-11 LAB — URINE CULTURE

## 2018-04-11 LAB — CBC
HCT: 30.4 % — ABNORMAL LOW (ref 36.0–46.0)
Hemoglobin: 9.3 g/dL — ABNORMAL LOW (ref 12.0–15.0)
MCH: 26.6 pg (ref 26.0–34.0)
MCHC: 30.6 g/dL (ref 30.0–36.0)
MCV: 87.1 fL (ref 80.0–100.0)
Platelets: 235 10*3/uL (ref 150–400)
RBC: 3.49 MIL/uL — ABNORMAL LOW (ref 3.87–5.11)
RDW: 14.2 % (ref 11.5–15.5)
WBC: 10 10*3/uL (ref 4.0–10.5)
nRBC: 0 % (ref 0.0–0.2)

## 2018-04-11 LAB — GLUCOSE, CAPILLARY
GLUCOSE-CAPILLARY: 152 mg/dL — AB (ref 70–99)
Glucose-Capillary: 128 mg/dL — ABNORMAL HIGH (ref 70–99)
Glucose-Capillary: 137 mg/dL — ABNORMAL HIGH (ref 70–99)
Glucose-Capillary: 174 mg/dL — ABNORMAL HIGH (ref 70–99)
Glucose-Capillary: 194 mg/dL — ABNORMAL HIGH (ref 70–99)

## 2018-04-11 LAB — BASIC METABOLIC PANEL
Anion gap: 5 (ref 5–15)
BUN: 11 mg/dL (ref 8–23)
CO2: 24 mmol/L (ref 22–32)
Calcium: 8.3 mg/dL — ABNORMAL LOW (ref 8.9–10.3)
Chloride: 110 mmol/L (ref 98–111)
Creatinine, Ser: 1.7 mg/dL — ABNORMAL HIGH (ref 0.44–1.00)
GFR calc Af Amer: 36 mL/min — ABNORMAL LOW (ref >60)
GFR calc non Af Amer: 31 mL/min — ABNORMAL LOW (ref >60)
Glucose, Bld: 133 mg/dL — ABNORMAL HIGH (ref 70–99)
Potassium: 4.1 mmol/L (ref 3.5–5.1)
Sodium: 139 mmol/L (ref 135–145)

## 2018-04-11 LAB — HIV ANTIBODY (ROUTINE TESTING W REFLEX): HIV Screen 4th Generation wRfx: NONREACTIVE

## 2018-04-11 MED ORDER — HYDRALAZINE HCL 20 MG/ML IJ SOLN
10.0000 mg | Freq: Once | INTRAMUSCULAR | Status: AC
  Administered 2018-04-11: 10 mg via INTRAVENOUS

## 2018-04-11 MED ORDER — HYDRALAZINE HCL 20 MG/ML IJ SOLN: 10.0000 mg | Freq: Four times a day (QID) | INTRAMUSCULAR | Status: DC | PRN

## 2018-04-11 MED ORDER — TECHNETIUM TC 99M MEBROFENIN IV KIT
5.0000 | PACK | Freq: Once | INTRAVENOUS | Status: AC | PRN
  Administered 2018-04-11: 5 via INTRAVENOUS

## 2018-04-11 MED ORDER — HYDRALAZINE HCL 20 MG/ML IJ SOLN
10.0000 mg | INTRAMUSCULAR | Status: DC | PRN
  Administered 2018-04-11 (×2): 10 mg via INTRAVENOUS
  Filled 2018-04-11 (×2): qty 1

## 2018-04-11 NOTE — Progress Notes (Signed)
Pt to be transferred to [facility] and report called off to the RN on the unit. Pt remains in procedure and will transport when pt return to the unit. Delia Heady RN

## 2018-04-11 NOTE — Progress Notes (Signed)
Progress Note    Zoe Clarke  XBJ:[id number] DOB: 1950-09-22  DOA: 04/10/2018 PCP: Antony Contras, MD    Brief Narrative:     Medical records reviewed and are as summarized below:  Zoe Clarke is an 68 y.o. female with medical history significant of hypertension, asthma, diabetes, GERD.  Patient presented secondary to right-sided flank pain that started last night.  Pain radiates from right upper quadrant of right flank, a sharp, and persistent.  urine unremarkable but CT shows ? Pyelonephritis, patient also has sludge and gall stones.    Assessment/Plan:   Principal Problem:   Acute pyelonephritis Active Problems:   Diabetes mellitus type 2, uncontrolled, with complications (HCC)   GERD (gastroesophageal reflux disease)   Diabetic neuropathy (HCC)   Stage 3 chronic kidney disease due to type 2 diabetes mellitus ([place])   Essential (primary) hypertension   Anemia   Hypertensive urgency  RUQ pain/flank pain/groin pain-- ?Acute pyelonephritis -Urinalysis suggests no infection. Urine culture obtained and is pending. -No fevers, white count, tachycardia. -Continue Ceftriaxone -follow Urine culture results -IVF -RUQ U/S shows gallstones and sludge -get HIDA scan  Diabetes mellitus, type II Last hemoglobin A1c from 2017 of 8.7% per chart review.  Patient follows with [facility] physicians.  Currently on glipizide as an outpatient. -SSI  Hypertensive urgency Was previously on antihypertensives but stated that they gave her headache.  She was on amlodipine at that time.  Currently does not take any antihypertensives -PRN hydralazine -once patient able to tolerate PO, start regimen for home  CKD stage III Appears to be stable. -Renally dose medication  Diabetic neuropathy Patient is on gabapentin 900 mg 3 times daily as an outpatient.  Patient with history of CKD with current GFR restricting current gabapentin dose -Reduce to gabapentin 300 mg 3 times  daily  Hyperlipidemia -Continue Lipitor 40 mg daily  Anemia Monitor -7 months ago, her Hgb was 12 -in the meantime, she has seen Dr. Burr Medico with B12/iron/folate studies  Borderline prolonged QTc -Qtc 471 on 1/24  Morbid obesity Estimated body mass index is 38.85 kg/m as calculated from the following:   Height as of this encounter: 5\' 8"  (1.727 m).   Weight as of this encounter: 115.9 kg.  Family Communication/Anticipated D/C date and plan/Code Status   DVT prophylaxis: Lovenox ordered. Code Status: Full Code.  Family Communication:  Disposition Plan: not improved despite abx, needs continued IVF as not able to keep down fluids/food, will get HIDA scan to r/o GB etiology   Medical Consultants:    None.   Subjective:   Still with nausea and is unable to eat anything  Objective:    Vitals:   04/10/18 1644 04/10/18 2234 04/11/18 0515 04/11/18 0700  BP:  (!) 158/67 (!) 182/73 (!) 173/68  Pulse:  70 77 67  Resp:  16 18 18   Temp:   98.4 F (36.9 C) 98.6 F (37 C)  TempSrc:   Oral Oral  SpO2:  95% 96% 98%  Weight:   115.9 kg   Height: 5\' 8"  (1.727 m)       Intake/Output Summary (Last 24 hours) at 04/11/2018 1033 Last data filed at 04/11/2018 0600 Gross per 24 hour  Intake 1715.82 ml  Output 0 ml  Net 1715.82 ml   Filed Weights   04/10/18 1609 04/11/18 0515  Weight: 112.6 kg 115.9 kg    Exam: Ill appearing, sitting in chair rrr +BS, obese, NT with palpation No rashes or lesions/blisters  Data  Reviewed:   I have personally reviewed following labs and imaging studies:  Labs: Labs show the following:   Basic Metabolic Panel: Recent Labs  Lab 04/10/18 0847 04/11/18 0543  NA 139 139  K 4.5 4.1  CL 107 110  CO2 24 24  GLUCOSE 190* 133*  BUN 10 11  CREATININE 1.63* 1.70*  CALCIUM 8.4* 8.3*   GFR Estimated Creatinine Clearance: 43.5 mL/min (A) (by C-G formula based on SCr of 1.7 mg/dL (H)). Liver Function Tests: Recent Labs  Lab  04/10/18 0847  AST 14*  ALT 13  ALKPHOS 90  BILITOT 0.8  PROT 6.5  ALBUMIN 2.9*   Recent Labs  Lab 04/10/18 0847  LIPASE 24   No results for input(s): AMMONIA in the last 168 hours. Coagulation profile No results for input(s): INR, PROTIME in the last 168 hours.  CBC: Recent Labs  Lab 04/10/18 0847 04/11/18 0543  WBC 9.6 10.0  NEUTROABS 7.6  --   HGB 9.8* 9.3*  HCT 31.7* 30.4*  MCV 87.1 87.1  PLT 242 235   Cardiac Enzymes: No results for input(s): CKTOTAL, CKMB, CKMBINDEX, TROPONINI in the last 168 hours. BNP (last 3 results) No results for input(s): PROBNP in the last 8760 hours. CBG: Recent Labs  Lab 04/10/18 0920 04/10/18 1607 04/10/18 2130 04/11/18 0048 04/11/18 0710  GLUCAP 178* 154* 154* 128* 137*   D-Dimer: No results for input(s): DDIMER in the last 72 hours. Hgb A1c: No results for input(s): HGBA1C in the last 72 hours. Lipid Profile: No results for input(s): CHOL, HDL, LDLCALC, TRIG, CHOLHDL, LDLDIRECT in the last 72 hours. Thyroid function studies: No results for input(s): TSH, T4TOTAL, T3FREE, THYROIDAB in the last 72 hours.  Invalid input(s): FREET3 Anemia work up: No results for input(s): VITAMINB12, FOLATE, FERRITIN, TIBC, IRON, RETICCTPCT in the last 72 hours. Sepsis Labs: Recent Labs  Lab 04/10/18 0847 04/11/18 0543  WBC 9.6 10.0    Microbiology No results found for this or any previous visit (from the past 240 hour(s)).  Procedures and diagnostic studies:  Ct Abdomen Pelvis W Contrast  Result Date: 04/10/2018 CLINICAL DATA:  Right-sided abdominal pain for 2 days EXAM: CT ABDOMEN AND PELVIS WITH CONTRAST TECHNIQUE: Multidetector CT imaging of the abdomen and pelvis was performed using the standard protocol following bolus administration of intravenous contrast. CONTRAST:  89mL OMNIPAQUE 300 COMPARISON:  04/16/2009 FINDINGS: Lower chest: Lung bases demonstrate multiple calcified granulomas on the left. Minimal dependent  atelectatic changes are noted. Hepatobiliary: Liver is well visualized and within normal limits. The gallbladder is well distended with dependent density consistent with multiple small gallstones. No ductal dilatation is seen. Pancreas: Unremarkable. No pancreatic ductal dilatation or surrounding inflammatory changes. Spleen: Normal in size without focal abnormality. Adrenals/Urinary Tract: Adrenal glands are within normal limits bilaterally. Kidneys are well visualized without focal mass lesion or hydronephrosis. Some decreased attenuation is noted in the upper pole of the right kidney posteriorly which may represent some focal pyelonephritis. No obstructive changes are noted. The bladder is well distended. Stomach/Bowel: Scattered diverticular change of the colon is noted. No findings to suggest diverticulitis are seen. The appendix is within normal limits. No small bowel or gastric abnormality is seen. Vascular/Lymphatic: Aortic atherosclerosis. No enlarged abdominal or pelvic lymph nodes. Reproductive: Uterus and bilateral adnexa are unremarkable. Other: No abdominal wall hernia or abnormality. No abdominopelvic ascites. Musculoskeletal: Degenerative changes of lumbar spine are noted. IMPRESSION: Changes suggestive of right-sided focal pyelonephritis. Changes of prior granulomatous disease. Cholelithiasis without complicating factors.  Diverticulosis without diverticulitis. Electronically Signed   By: Inez Catalina M.D.   On: 04/10/2018 11:10   US Abdomen Limited Ruq  Result Date: 04/10/2018 CLINICAL DATA:  Right upper quadrant pain for a day. EXAM: ULTRASOUND ABDOMEN LIMITED RIGHT UPPER QUADRANT COMPARISON:  None. FINDINGS: Gallbladder: Numerous tiny stones and sludge in the gallbladder. No wall thickening, pericholecystic fluid, or Murphy's sign. Common bile duct: Diameter: 3.9 mm Liver: No focal lesion identified. Within normal limits in parenchymal echogenicity. Portal vein is patent on color Doppler  imaging with normal direction of blood flow towards the liver. IMPRESSION: Numerous tiny stones and sludge layering in the gallbladder without wall thickening, pericholecystic fluid, or Murphy's sign. Electronically Signed   By: Dorise Bullion III M.D   On: 04/10/2018 14:37    Medications:   . atorvastatin  40 mg Oral q1800  . enoxaparin (LOVENOX) injection  40 mg Subcutaneous Q24H  . gabapentin  300 mg Oral TID  . insulin aspart  0-9 Units Subcutaneous TID WC  . loratadine  10 mg Oral Daily   Continuous Infusions: . sodium chloride 100 mL/hr at 04/11/18 0531  . cefTRIAXone (ROCEPHIN)  IV       LOS: 0 days   Geradine Girt  [facility]   *Please refer to [email or web address], password [email or web address] to get updated schedule on who will round on this patient, as hospitalists switch teams weekly. If 7PM-7AM, please contact night-coverage at [email or web address], password [email or web address] for any overnight needs.  04/11/2018, 10:33 AM

## 2018-04-11 NOTE — Progress Notes (Signed)
Pt arrived back to unit and pt transporting off to [facility]. Pt and spouse at bedside informed. Delia Heady RN

## 2018-04-11 NOTE — Progress Notes (Signed)
Pt transported off unit to [facility] for procedure. Delia Heady RN

## 2018-04-12 DIAGNOSIS — N10 Acute tubulo-interstitial nephritis: Principal | ICD-10-CM

## 2018-04-12 LAB — URINALYSIS, ROUTINE W REFLEX MICROSCOPIC
Bacteria, UA: NONE SEEN
Bilirubin Urine: NEGATIVE
Glucose, UA: 50 mg/dL — AB
Ketones, ur: NEGATIVE mg/dL
Leukocytes, UA: NEGATIVE
Nitrite: NEGATIVE
Protein, ur: 100 mg/dL — AB
Specific Gravity, Urine: 1.017 (ref 1.005–1.030)
pH: 5 (ref 5.0–8.0)

## 2018-04-12 LAB — GLUCOSE, CAPILLARY
Glucose-Capillary: 184 mg/dL — ABNORMAL HIGH (ref 70–99)
Glucose-Capillary: 146 mg/dL — ABNORMAL HIGH (ref 70–99)
Glucose-Capillary: 113 mg/dL — ABNORMAL HIGH (ref 70–99)
Glucose-Capillary: 118 mg/dL — ABNORMAL HIGH (ref 70–99)

## 2018-04-12 MED ORDER — CLONIDINE HCL 0.1 MG PO TABS
0.1000 mg | ORAL_TABLET | Freq: Three times a day (TID) | ORAL | Status: DC
  Administered 2018-04-12 – 2018-04-13 (×3): 0.1 mg via ORAL
  Filled 2018-04-12 (×3): qty 1

## 2018-04-12 MED ORDER — CARVEDILOL 3.125 MG PO TABS
3.1250 mg | ORAL_TABLET | Freq: Two times a day (BID) | ORAL | Status: DC
  Administered 2018-04-12 – 2018-04-14 (×4): 3.125 mg via ORAL
  Filled 2018-04-12 (×4): qty 1

## 2018-04-12 NOTE — Progress Notes (Signed)
Progress Note    Lanyiah Brix  WYO:[id number] DOB: 1950/11/09  DOA: 04/10/2018 PCP: Antony Contras, MD    Brief Narrative:   Zoe Clarke is an 68 y.o. female with medical history significant of hypertension, asthma, diabetes, GERD.  Patient presented secondary to right-sided flank pain that started last night.  Pain radiates from right upper quadrant of right flank, a sharp, and persistent.  urine unremarkable but CT shows ? Pyelonephritis, patient also has sludge and gall stones.    04/12/2018: Nuclear imaging of the right upper quadrant revealed low gallbladder EF (18%).  Surgical team has been consulted.  Assessment/Plan:   Principal Problem:   Acute pyelonephritis Active Problems:   Diabetes mellitus type 2, uncontrolled, with complications (HCC)   GERD (gastroesophageal reflux disease)   Diabetic neuropathy (HCC)   Stage 3 chronic kidney disease due to type 2 diabetes mellitus ([place])   Essential (primary) hypertension   Anemia   Hypertensive urgency  RUQ pain/flank pain/groin pain-- ?Acute pyelonephritis -Urinalysis suggests no infection. Urine culture obtained and is pending. -No fevers, white count, tachycardia. -Continue Ceftriaxone -follow Urine culture results -IVF -RUQ U/S shows gallstones and sludge -get HIDA scan 04/12/2018: HIDA scan reveals low gallbladder EF (18%).  Surgical team consulted.  Right upper quadrant tenderness persists.  Diabetes mellitus, type II Last hemoglobin A1c from 2017 of 8.7% per chart review.  Patient follows with [facility] physicians.  Currently on glipizide as an outpatient. -SSI 04/12/2018: Continue to optimize.  Hypertensive urgency Was previously on antihypertensives but stated that they gave her headache.  She was on amlodipine at that time.  Currently does not take any antihypertensives -PRN hydralazine -once patient able to tolerate PO, start regimen for home 04/12/2018: Start Coreg 3.125 mg p.o. twice daily.  Also  start clonidine 0.1 mg p.o. 3 times daily.  CKD stage III Appears to be stable. -Renally dose medication  Diabetic neuropathy Patient is on gabapentin 900 mg 3 times daily as an outpatient.  Patient with history of CKD with current GFR restricting current gabapentin dose -Reduce to gabapentin 300 mg 3 times daily  Hyperlipidemia -Continue Lipitor 40 mg daily  Anemia Monitor -7 months ago, her Hgb was 12 -in the meantime, she has seen Dr. Burr Medico with B12/iron/folate studies  Borderline prolonged QTc -Qtc 471 on 1/24  Morbid obesity Estimated body mass index is 39.99 kg/m as calculated from the following:   Height as of this encounter: 5\' 8"  (1.727 m).   Weight as of this encounter: 119.3 kg.  Family Communication/Anticipated D/C date and plan/Code Status   DVT prophylaxis: Lovenox ordered. Code Status: Full Code.  Family Communication:  Disposition Plan: not improved despite abx, needs continued IVF as not able to keep down fluids/food, will get HIDA scan to r/o GB etiology   Medical Consultants:    None.   Subjective:   Patient continues to have right upper quadrant pain. No nausea or vomiting.  Objective:    Vitals:   04/11/18 2022 04/12/18 0438 04/12/18 0931 04/12/18 0933  BP: (!) 181/76 (!) 184/79 (!) 130/48 (!) 149/55  Pulse: 87 100 80 79  Resp: 20 18 18    Temp: 98.9 F (37.2 C) 99.9 F (37.7 C) 98.9 F (37.2 C)   TempSrc: Oral Oral Oral   SpO2: 90% 93% 94%   Weight: 119.3 kg     Height:        Intake/Output Summary (Last 24 hours) at 04/12/2018 1018 Last data filed at 04/12/2018 0200  Gross per 24 hour  Intake 686.55 ml  Output -  Net 686.55 ml   Filed Weights   04/10/18 1609 04/11/18 0515 04/11/18 2022  Weight: 112.6 kg 115.9 kg 119.3 kg    Exam: Condition: Obese.  Not in any significant distress.   Neck: Supple.  JVD is difficult to assess.   Lungs: Clear to auscultation.   CVS: S1-S2.   Abdomen: Right upper quadrant tenderness.   Organs are difficult to assess.   Neuro: Awake and alert.  Patient seems to move all extremities.     Data Reviewed:   I have personally reviewed following labs and imaging studies:  Labs: Labs show the following:   Basic Metabolic Panel: Recent Labs  Lab 04/10/18 0847 04/11/18 0543  NA 139 139  K 4.5 4.1  CL 107 110  CO2 24 24  GLUCOSE 190* 133*  BUN 10 11  CREATININE 1.63* 1.70*  CALCIUM 8.4* 8.3*   GFR Estimated Creatinine Clearance: 44.2 mL/min (A) (by C-G formula based on SCr of 1.7 mg/dL (H)). Liver Function Tests: Recent Labs  Lab 04/10/18 0847  AST 14*  ALT 13  ALKPHOS 90  BILITOT 0.8  PROT 6.5  ALBUMIN 2.9*   Recent Labs  Lab 04/10/18 0847  LIPASE 24   No results for input(s): AMMONIA in the last 168 hours. Coagulation profile No results for input(s): INR, PROTIME in the last 168 hours.  CBC: Recent Labs  Lab 04/10/18 0847 04/11/18 0543  WBC 9.6 10.0  NEUTROABS 7.6  --   HGB 9.8* 9.3*  HCT 31.7* 30.4*  MCV 87.1 87.1  PLT 242 235   Cardiac Enzymes: No results for input(s): CKTOTAL, CKMB, CKMBINDEX, TROPONINI in the last 168 hours. BNP (last 3 results) No results for input(s): PROBNP in the last 8760 hours. CBG: Recent Labs  Lab 04/11/18 0710 04/11/18 1148 04/11/18 1718 04/11/18 2102 04/12/18 0726  GLUCAP 137* 152* 194* 174* 184*   D-Dimer: No results for input(s): DDIMER in the last 72 hours. Hgb A1c: No results for input(s): HGBA1C in the last 72 hours. Lipid Profile: No results for input(s): CHOL, HDL, LDLCALC, TRIG, CHOLHDL, LDLDIRECT in the last 72 hours. Thyroid function studies: No results for input(s): TSH, T4TOTAL, T3FREE, THYROIDAB in the last 72 hours.  Invalid input(s): FREET3 Anemia work up: No results for input(s): VITAMINB12, FOLATE, FERRITIN, TIBC, IRON, RETICCTPCT in the last 72 hours. Sepsis Labs: Recent Labs  Lab 04/10/18 0847 04/11/18 0543  WBC 9.6 10.0    Microbiology Recent Results (from the  past 240 hour(s))  Urine culture     Status: Abnormal   Collection Time: 04/10/18  8:47 AM  Result Value Ref Range Status   Specimen Description URINE, RANDOM  Final   Special Requests   Final    NONE Performed at [facility], [address] [place], [place] [address]    Culture MULTIPLE SPECIES PRESENT, SUGGEST RECOLLECTION (A)  Final   Report Status 04/11/2018 FINAL  Final    Procedures and diagnostic studies:  Ct Abdomen Pelvis W Contrast  Result Date: 04/10/2018 CLINICAL DATA:  Right-sided abdominal pain for 2 days EXAM: CT ABDOMEN AND PELVIS WITH CONTRAST TECHNIQUE: Multidetector CT imaging of the abdomen and pelvis was performed using the standard protocol following bolus administration of intravenous contrast. CONTRAST:  88mL OMNIPAQUE 300 COMPARISON:  04/16/2009 FINDINGS: Lower chest: Lung bases demonstrate multiple calcified granulomas on the left. Minimal dependent atelectatic changes are noted. Hepatobiliary: Liver is well visualized and  within normal limits. The gallbladder is well distended with dependent density consistent with multiple small gallstones. No ductal dilatation is seen. Pancreas: Unremarkable. No pancreatic ductal dilatation or surrounding inflammatory changes. Spleen: Normal in size without focal abnormality. Adrenals/Urinary Tract: Adrenal glands are within normal limits bilaterally. Kidneys are well visualized without focal mass lesion or hydronephrosis. Some decreased attenuation is noted in the upper pole of the right kidney posteriorly which may represent some focal pyelonephritis. No obstructive changes are noted. The bladder is well distended. Stomach/Bowel: Scattered diverticular change of the colon is noted. No findings to suggest diverticulitis are seen. The appendix is within normal limits. No small bowel or gastric abnormality is seen. Vascular/Lymphatic: Aortic atherosclerosis. No enlarged abdominal or pelvic lymph nodes. Reproductive: Uterus and  bilateral adnexa are unremarkable. Other: No abdominal wall hernia or abnormality. No abdominopelvic ascites. Musculoskeletal: Degenerative changes of lumbar spine are noted. IMPRESSION: Changes suggestive of right-sided focal pyelonephritis. Changes of prior granulomatous disease. Cholelithiasis without complicating factors. Diverticulosis without diverticulitis. Electronically Signed   By: Inez Catalina M.D.   On: 04/10/2018 11:10   Nm Hepato W/eject Fract  Result Date: 04/11/2018 CLINICAL DATA:  68 y/o F; right upper quadrant and right flank pain. EXAM: NUCLEAR MEDICINE HEPATOBILIARY IMAGING WITH GALLBLADDER EF TECHNIQUE: Sequential images of the abdomen were obtained out to 60 minutes following intravenous administration of radiopharmaceutical. After oral ingestion of Ensure, gallbladder ejection fraction was determined. At 60 min, normal ejection fraction is greater than 33%. RADIOPHARMACEUTICALS:  5.25 mCi Tc-74m  Choletec IV COMPARISON:  04/10/2018 abdominal ultrasound FINDINGS: Prompt uptake and biliary excretion of activity by the liver is seen. Gallbladder activity is visualized, consistent with patency of cystic duct. Biliary activity passes into small bowel, consistent with patent common bile duct. Calculated gallbladder ejection fraction is 18%. (Normal gallbladder ejection fraction with Ensure is greater than 33%.) IMPRESSION: 1. Patent cystic and common bile ducts. 2. Low gallbladder ejection fraction of 18%. Electronically Signed   By: Kristine Garbe M.D.   On: 04/11/2018 16:56   US Abdomen Limited Ruq  Result Date: 04/10/2018 CLINICAL DATA:  Right upper quadrant pain for a day. EXAM: ULTRASOUND ABDOMEN LIMITED RIGHT UPPER QUADRANT COMPARISON:  None. FINDINGS: Gallbladder: Numerous tiny stones and sludge in the gallbladder. No wall thickening, pericholecystic fluid, or Murphy's sign. Common bile duct: Diameter: 3.9 mm Liver: No focal lesion identified. Within normal limits in  parenchymal echogenicity. Portal vein is patent on color Doppler imaging with normal direction of blood flow towards the liver. IMPRESSION: Numerous tiny stones and sludge layering in the gallbladder without wall thickening, pericholecystic fluid, or Murphy's sign. Electronically Signed   By: Dorise Bullion III M.D   On: 04/10/2018 14:37    Medications:   . atorvastatin  40 mg Oral q1800  . enoxaparin (LOVENOX) injection  40 mg Subcutaneous Q24H  . gabapentin  300 mg Oral TID  . insulin aspart  0-9 Units Subcutaneous TID WC  . loratadine  10 mg Oral Daily   Continuous Infusions: . sodium chloride 100 mL/hr at 04/11/18 2324  . cefTRIAXone (ROCEPHIN)  IV 1 g (04/11/18 1234)     LOS: 1 day   [facility]   *Please refer to [email or web address], password [email or web address] to get updated schedule on who will round on this patient, as hospitalists switch teams weekly. If 7PM-7AM, please contact night-coverage at [email or web address], password [email or web address] for any overnight needs.  04/12/2018, 10:18 AM

## 2018-04-12 NOTE — Plan of Care (Signed)
Bed in lowest position, bed alarm on and call light within reach.

## 2018-04-12 NOTE — Progress Notes (Signed)
Sent urine for culture.   Eleanora Neighbor, RN

## 2018-04-12 NOTE — Consult Note (Signed)
[facility] Surgery Consult Note  Zoe Clarke [birth year]  [id number].    Requesting MD: Marthenia Rolling Chief Complaint/Reason for Consult: RUQ pain   HPI:  Patient is a 68 year old female who came to [facility] evening of 1/23 for RUQ and R flank pain. Patient reports pain has been occurring for 2 weeks intermittently. Patient does not note anything specific that brings pain on and does not note anything that improves pain. Does not directly associate pain with eating but reports she has been nauseated and vomiting everything she eats for the last 2 weeks. She has had some liquids and graham crackers today and kept that down. She does report that she currently has pain in RUQ. Patient denies fever, chills, diarrhea or constipation. She reports chest pain and SOB but does not feel like this is changed significantly over the last 2 weeks. She also reported dysuria and had a CT on admission that suggested pyelonephritis. Patient is a generally poor historian and has a hard time focusing. Patient's husband at bedside and interrupting frequently to talk about his chronic pain issues. PMH significant for HTN, T2DM, GERD, asthma, and chronic pain related to arthritis. Patient reports she uses inhaler 2-3x per month. Reports she smokes a pack of cigarettes every 3-4 days and smokes marijuana for pain control. Denies alcohol use. Allergic to ASA, allopurinol, amlodipine and lisinopril.   ROS: Review of Systems  Constitutional: Negative for chills and fever.  Respiratory: Positive for wheezing. Negative for shortness of breath.   Cardiovascular: Negative for chest pain and palpitations.  Gastrointestinal: Positive for abdominal pain, nausea and vomiting. Negative for constipation and diarrhea.  Genitourinary: Positive for dysuria. Negative for frequency and urgency.  All other systems reviewed and are negative.   Family History  Problem Relation Age of Onset  . Arthritis Mother   . Heart disease Mother    . Hypertension Mother   . Arthritis Maternal Grandmother   . Hypertension Maternal Grandmother   . Stroke Maternal Grandfather   . Cancer Sister        unknown type cancer  . Colon cancer Neg Hx   . Esophageal cancer Neg Hx   . Stomach cancer Neg Hx   . Rectal cancer Neg Hx     Past Medical History:  Diagnosis Date  . Allergy   . Arthritis   . Asthma   . Diabetes mellitus without complication ([place])   . Gastric ulcer   . GERD (gastroesophageal reflux disease)   . Hypertension   . Osteoporosis     Past Surgical History:  Procedure Laterality Date  . TUBAL LIGATION      Social History:  reports that she has been smoking cigarettes. She has a 19.50 pack-year smoking history. She has never used smokeless tobacco. She reports current drug use. Drug: Marijuana. She reports that she does not drink alcohol.  Allergies:  Allergies  Allergen Reactions  . Aspirin Other (See Comments)    Feels funny   . Allopurinol     Dizzy; hard to ambulate  Couldn't see; sweating  . Amlodipine Other (See Comments)    headache  . Lisinopril Swelling    Face swelling    Facility-Administered Medications Prior to Admission  Medication Dose Route Frequency Provider Last Rate Last Dose  . 0.9 %  sodium chloride infusion  500 mL Intravenous Continuous Milus Banister, MD       Medications Prior to Admission  Medication Sig Dispense Refill  . albuterol (PROVENTIL HFA;VENTOLIN  HFA) 108 (90 BASE) MCG/ACT inhaler Inhale 2 puffs into the lungs every 6 (six) hours as needed for wheezing or shortness of breath. 1 Inhaler 0  . atorvastatin (LIPITOR) 40 MG tablet Take 40 mg by mouth daily.    . cyclobenzaprine (FLEXERIL) 5 MG tablet Take 1 tablet (5 mg total) by mouth at bedtime. 90 tablet 0  . diphenhydramine-acetaminophen (TYLENOL PM EXTRA STRENGTH) 25-500 MG TABS tablet Take 1 tablet by mouth at bedtime as needed (pain).     . fexofenadine (ALLEGRA) 180 MG tablet Take 180 mg by mouth daily.    Marland Kitchen  gabapentin (NEURONTIN) 300 MG capsule Take 3 capsules (900 mg total) by mouth 3 (three) times daily. 810 capsule 0  . glipiZIDE (GLUCOTROL) 10 MG tablet Take 1 tablet (10 mg total) by mouth 2 (two) times daily before a meal. 180 tablet 0  . linaclotide (LINZESS) 145 MCG CAPS capsule Take 145 mcg by mouth as needed (constipation).    . Phenyleph-CPM-DM-Aspirin (ALKA-SELTZER PLUS COLD & COUGH PO) Take 1 tablet by mouth as needed (cold).     . predniSONE (DELTASONE) 20 MG tablet 2 tabs po daily x 4 days (Patient not taking: Reported on 09/10/2017) 8 tablet 0  . ranitidine (ZANTAC) 150 MG tablet TAKE 1 TABLET (150 MG TOTAL) BY MOUTH 2 (TWO) TIMES DAILY. (Patient not taking: No sig reported) 180 tablet 0  . triamcinolone cream (KENALOG) 0.1 % Apply 1 application topically 2 (two) times daily. (Patient not taking: Reported on 04/10/2018) 30 g 0    Blood pressure (!) 149/55, pulse 79, temperature 98.9 F (37.2 C), temperature source Oral, resp. rate 18, height 5\' 8"  (1.727 m), weight 119.3 kg, SpO2 94 %. Physical Exam: Physical Exam Constitutional:      General: She is not in acute distress.    Appearance: She is well-developed. She is obese. She is not toxic-appearing.  HENT:     Head: Normocephalic and atraumatic.     Right Ear: External ear normal.     Left Ear: External ear normal.     Nose: Nose normal.     Mouth/Throat:     Dentition: Abnormal dentition.  Eyes:     General: Lids are normal. No scleral icterus.    Extraocular Movements: Extraocular movements intact.     Conjunctiva/sclera: Conjunctivae normal.  Neck:     Musculoskeletal: Normal range of motion and neck supple.  Cardiovascular:     Rate and Rhythm: Normal rate and regular rhythm.  Pulmonary:     Effort: Pulmonary effort is normal.     Breath sounds: Wheezing (mild, bilateral ) present.  Abdominal:     General: Bowel sounds are normal. There is no distension.     Palpations: Abdomen is soft.     Tenderness: There is  abdominal tenderness (mild). There is no guarding or rebound. Negative signs include Murphy's sign.  Musculoskeletal:     Comments: No gross deformities of upper and lower extremities bilaterally   Skin:    General: Skin is warm and dry.  Neurological:     Mental Status: She is alert and oriented to person, place, and time.  Psychiatric:        Behavior: Behavior is cooperative.     Results for orders placed or performed during the hospital encounter of 04/10/18 (from the past 48 hour(s))  Urinalysis, Routine w reflex microscopic     Status: Abnormal   Collection Time: 04/10/18 12:20 PM  Result Value Ref Range  Color, Urine YELLOW YELLOW   APPearance CLEAR CLEAR   Specific Gravity, Urine 1.024 1.005 - 1.030   pH 7.0 5.0 - 8.0   Glucose, UA 50 (A) NEGATIVE mg/dL   Hgb urine dipstick SMALL (A) NEGATIVE   Bilirubin Urine NEGATIVE NEGATIVE   Ketones, ur NEGATIVE NEGATIVE mg/dL   Protein, ur >=300 (A) NEGATIVE mg/dL   Nitrite NEGATIVE NEGATIVE   Leukocytes, UA NEGATIVE NEGATIVE   RBC / HPF 0-5 0 - 5 RBC/hpf   WBC, UA 0-5 0 - 5 WBC/hpf   Bacteria, UA NONE SEEN NONE SEEN   Squamous Epithelial / LPF 0-5 0 - 5    Comment: Performed at [facility], [place] [address], [place], [place] [address]  Glucose, capillary     Status: Abnormal   Collection Time: 04/10/18  4:07 PM  Result Value Ref Range   Glucose-Capillary 154 (H) 70 - 99 mg/dL  Glucose, capillary     Status: Abnormal   Collection Time: 04/10/18  9:30 PM  Result Value Ref Range   Glucose-Capillary 154 (H) 70 - 99 mg/dL  Glucose, capillary     Status: Abnormal   Collection Time: 04/11/18 12:48 AM  Result Value Ref Range   Glucose-Capillary 128 (H) 70 - 99 mg/dL  HIV antibody (Routine Testing)     Status: None   Collection Time: 04/11/18  5:43 AM  Result Value Ref Range   HIV Screen 4th Generation wRfx Non Reactive Non Reactive    Comment: (NOTE) Performed At: [facility] [place], [place]  [id number] Rush Farmer MD JJ:[id number]   Basic metabolic panel     Status: Abnormal   Collection Time: 04/11/18  5:43 AM  Result Value Ref Range   Sodium 139 135 - 145 mmol/L   Potassium 4.1 3.5 - 5.1 mmol/L   Chloride 110 98 - 111 mmol/L   CO2 24 22 - 32 mmol/L   Glucose, Bld 133 (H) 70 - 99 mg/dL   BUN 11 8 - 23 mg/dL   Creatinine, Ser 1.70 (H) 0.44 - 1.00 mg/dL   Calcium 8.3 (L) 8.9 - 10.3 mg/dL   GFR calc non Af Amer 31 (L) >60 mL/min   GFR calc Af Amer 36 (L) >60 mL/min   Anion gap 5 5 - 15    Comment: Performed at [facility], [place] [address] [place], [place] [address]  CBC     Status: Abnormal   Collection Time: 04/11/18  5:43 AM  Result Value Ref Range   WBC 10.0 4.0 - 10.5 K/uL   RBC 3.49 (L) 3.87 - 5.11 MIL/uL   Hemoglobin 9.3 (L) 12.0 - 15.0 g/dL   HCT 30.4 (L) 36.0 - 46.0 %   MCV 87.1 80.0 - 100.0 fL   MCH 26.6 26.0 - 34.0 pg   MCHC 30.6 30.0 - 36.0 g/dL   RDW 14.2 11.5 - 15.5 %   Platelets 235 150 - 400 K/uL   nRBC 0.0 0.0 - 0.2 %    Comment: Performed at [facility], [place] [address] [place], [place] [address]  Glucose, capillary     Status: Abnormal   Collection Time: 04/11/18  7:10 AM  Result Value Ref Range   Glucose-Capillary 137 (H) 70 - 99 mg/dL  Glucose, capillary     Status: Abnormal   Collection Time: 04/11/18 11:48 AM  Result Value Ref Range   Glucose-Capillary 152 (H) 70 - 99 mg/dL  Glucose, capillary  Status: Abnormal   Collection Time: 04/11/18  5:18 PM  Result Value Ref Range   Glucose-Capillary 194 (H) 70 - 99 mg/dL  Glucose, capillary     Status: Abnormal   Collection Time: 04/11/18  9:02 PM  Result Value Ref Range   Glucose-Capillary 174 (H) 70 - 99 mg/dL  Glucose, capillary     Status: Abnormal   Collection Time: 04/12/18  7:26 AM  Result Value Ref Range   Glucose-Capillary 184 (H) 70 - 99 mg/dL   Nm Hepato W/eject Fract  Result Date: 04/11/2018 CLINICAL DATA:  68 y/o F; right upper quadrant and right flank  pain. EXAM: NUCLEAR MEDICINE HEPATOBILIARY IMAGING WITH GALLBLADDER EF TECHNIQUE: Sequential images of the abdomen were obtained out to 60 minutes following intravenous administration of radiopharmaceutical. After oral ingestion of Ensure, gallbladder ejection fraction was determined. At 60 min, normal ejection fraction is greater than 33%. RADIOPHARMACEUTICALS:  5.25 mCi Tc-24m  Choletec IV COMPARISON:  04/10/2018 abdominal ultrasound FINDINGS: Prompt uptake and biliary excretion of activity by the liver is seen. Gallbladder activity is visualized, consistent with patency of cystic duct. Biliary activity passes into small bowel, consistent with patent common bile duct. Calculated gallbladder ejection fraction is 18%. (Normal gallbladder ejection fraction with Ensure is greater than 33%.) IMPRESSION: 1. Patent cystic and common bile ducts. 2. Low gallbladder ejection fraction of 18%. Electronically Signed   By: Kristine Garbe M.D.   On: 04/11/2018 16:56   US Abdomen Limited Ruq  Result Date: 04/10/2018 CLINICAL DATA:  Right upper quadrant pain for a day. EXAM: ULTRASOUND ABDOMEN LIMITED RIGHT UPPER QUADRANT COMPARISON:  None. FINDINGS: Gallbladder: Numerous tiny stones and sludge in the gallbladder. No wall thickening, pericholecystic fluid, or Murphy's sign. Common bile duct: Diameter: 3.9 mm Liver: No focal lesion identified. Within normal limits in parenchymal echogenicity. Portal vein is patent on color Doppler imaging with normal direction of blood flow towards the liver. IMPRESSION: Numerous tiny stones and sludge layering in the gallbladder without wall thickening, pericholecystic fluid, or Murphy's sign. Electronically Signed   By: Dorise Bullion III M.D   On: 04/10/2018 14:37      Assessment/Plan HTN with hypertensive urgency Borderline prolonged QTc HLD T2DM - glucose running in high 100s Asthma - current everyday smoker, uses tobacco and marijuana  GERD CKD stage  III ?pyelonephritis Morbid obesity  Cholelithiasis, biliary colic - [place] not suggestive of cholecystitis - HIDA more suggestive of biliary dyskinesia than cholecystitis - patient is unsure whether she wants surgery at this time - I think that it is reasonable to try low fat diet and she if she tolerates it and re-examine patient in the AM - if patient unable to tolerate diet, will discuss possible lap chole ?maybe 1/27? As this is non-emergent - patient would also need cardiac clearance prior to surgical intervention - recommend cards consult   Brigid Re, [facility] Surgery 04/12/2018, 11:14 AM Pager: [phone] Consults: [phone] Mon-Fri 7:00 am-4:30 pm Sat-Sun 7:00 am-11:30 am

## 2018-04-13 LAB — COMPREHENSIVE METABOLIC PANEL
ALBUMIN: 2.6 g/dL — AB (ref 3.5–5.0)
ALT: 12 U/L (ref 0–44)
AST: 15 U/L (ref 15–41)
Alkaline Phosphatase: 72 U/L (ref 38–126)
Anion gap: 9 (ref 5–15)
BUN: 15 mg/dL (ref 8–23)
CHLORIDE: 111 mmol/L (ref 98–111)
CO2: 19 mmol/L — AB (ref 22–32)
Calcium: 8.1 mg/dL — ABNORMAL LOW (ref 8.9–10.3)
Creatinine, Ser: 2.22 mg/dL — ABNORMAL HIGH (ref 0.44–1.00)
GFR calc Af Amer: 26 mL/min — ABNORMAL LOW (ref >60)
GFR calc non Af Amer: 22 mL/min — ABNORMAL LOW (ref >60)
Glucose, Bld: 107 mg/dL — ABNORMAL HIGH (ref 70–99)
Potassium: 4.9 mmol/L (ref 3.5–5.1)
Sodium: 139 mmol/L (ref 135–145)
Total Bilirubin: 0.4 mg/dL (ref 0.3–1.2)
Total Protein: 5.9 g/dL — ABNORMAL LOW (ref 6.5–8.1)

## 2018-04-13 LAB — CBC
HEMATOCRIT: 30 % — AB (ref 36.0–46.0)
HEMOGLOBIN: 9.7 g/dL — AB (ref 12.0–15.0)
MCH: 28 pg (ref 26.0–34.0)
MCHC: 32.3 g/dL (ref 30.0–36.0)
MCV: 86.5 fL (ref 80.0–100.0)
Platelets: 236 10*3/uL (ref 150–400)
RBC: 3.47 MIL/uL — ABNORMAL LOW (ref 3.87–5.11)
RDW: 14.3 % (ref 11.5–15.5)
WBC: 11 10*3/uL — ABNORMAL HIGH (ref 4.0–10.5)
nRBC: 0 % (ref 0.0–0.2)

## 2018-04-13 LAB — GLUCOSE, CAPILLARY
Glucose-Capillary: 113 mg/dL — ABNORMAL HIGH (ref 70–99)
Glucose-Capillary: 172 mg/dL — ABNORMAL HIGH (ref 70–99)
Glucose-Capillary: 106 mg/dL — ABNORMAL HIGH (ref 70–99)
Glucose-Capillary: 189 mg/dL — ABNORMAL HIGH (ref 70–99)

## 2018-04-13 MED ORDER — PANTOPRAZOLE SODIUM 40 MG PO TBEC
40.0000 mg | DELAYED_RELEASE_TABLET | Freq: Every day | ORAL | Status: DC
  Administered 2018-04-13 – 2018-04-14 (×2): 40 mg via ORAL
  Filled 2018-04-13 (×2): qty 1

## 2018-04-13 MED ORDER — FUROSEMIDE 10 MG/ML IJ SOLN
100.0000 mg | Freq: Once | INTRAVENOUS | Status: AC
  Administered 2018-04-13: 100 mg via INTRAVENOUS
  Filled 2018-04-13: qty 10

## 2018-04-13 MED ORDER — CLONIDINE HCL 0.2 MG PO TABS
0.2000 mg | ORAL_TABLET | Freq: Three times a day (TID) | ORAL | Status: DC
  Administered 2018-04-13 – 2018-04-14 (×3): 0.2 mg via ORAL
  Filled 2018-04-13 (×3): qty 1

## 2018-04-13 NOTE — Progress Notes (Signed)
Pt. Is complaining of heartburn. NO current PRN orders. MD notified. MD gave verbal order for Protonix PO 40 mg daily. Orders placed. Will continue to monitor.

## 2018-04-13 NOTE — Progress Notes (Signed)
Patient ID: Zoe Clarke, female   DOB: Mar 01, 1951, 68 y.o.   MRN: [id number]   Acute Care Surgery Service Progress Note:    Chief Complaint/Subjective: Pt reports no pain during/after eating No n/v Still with some minor Rt flank discomfort  Objective: Vital signs in last 24 hours: Temp:  [98 F (36.7 C)-98.9 F (37.2 C)] 98.9 F (37.2 C) (01/26 0617) Pulse Rate:  [59-80] 80 (01/26 0915) Resp:  [18-21] 18 (01/26 0915) BP: (130-206)/(48-92) 206/72 (01/26 0915) SpO2:  [91 %-99 %] 97 % (01/26 0915) Weight:  [119.3 kg] 119.3 kg (01/25 2100) Last BM Date: 04/11/18  Intake/Output from previous day: 01/25 0701 - 01/26 0700 In: 1620 [P.O.:1620] Out: 300 [Urine:300] Intake/Output this shift: No intake/output data recorded.  Lungs: cta, nonlabored  Cardiovascular: reg  Abd: obese, soft, nt  Extremities: no edema, +SCDs  Neuro: alert, nonfocal  Lab Results: CBC  Recent Labs    04/11/18 0543 04/13/18 0621  WBC 10.0 11.0*  HGB 9.3* 9.7*  HCT 30.4* 30.0*  PLT 235 236   BMET Recent Labs    04/11/18 0543 04/13/18 0621  NA 139 139  K 4.1 4.9  CL 110 111  CO2 24 19*  GLUCOSE 133* 107*  BUN 11 15  CREATININE 1.70* 2.22*  CALCIUM 8.3* 8.1*   LFT Hepatic Function Latest Ref Rng & Units 04/13/2018 04/10/2018 08/25/2015  Total Protein 6.5 - 8.1 g/dL 5.9(L) 6.5 7.1  Albumin 3.5 - 5.0 g/dL 2.6(L) 2.9(L) 3.7  AST 15 - 41 U/L 15 14(L) 11  ALT 0 - 44 U/L '12 13 10  '$ Alk Phosphatase 38 - 126 U/L 72 90 93  Total Bilirubin 0.3 - 1.2 mg/dL 0.4 0.8 0.3   PT/INR No results for input(s): LABPROT, INR in the last 72 hours. ABG No results for input(s): PHART, HCO3 in the last 72 hours.  Invalid input(s): PCO2, PO2  Studies/Results:  Anti-infectives: Anti-infectives (From admission, onward)   Start     Dose/Rate Route Frequency Ordered Stop   04/11/18 1400  cefTRIAXone (ROCEPHIN) 1 g in sodium chloride 0.9 % 100 mL IVPB  Status:  Discontinued     1 g 200 mL/hr over 30  Minutes Intravenous Every 24 hours 04/10/18 1441 04/10/18 1442   04/11/18 1200  cefTRIAXone (ROCEPHIN) 1 g in sodium chloride 0.9 % 100 mL IVPB     1 g 200 mL/hr over 30 Minutes Intravenous Every 24 hours 04/10/18 1442     04/10/18 1145  cefTRIAXone (ROCEPHIN) 1 g in sodium chloride 0.9 % 100 mL IVPB     1 g 200 mL/hr over 30 Minutes Intravenous  Once 04/10/18 1141 04/10/18 1306      Medications: Scheduled Meds: . atorvastatin  40 mg Oral q1800  . carvedilol  3.125 mg Oral BID WC  . cloNIDine  0.1 mg Oral TID  . enoxaparin (LOVENOX) injection  40 mg Subcutaneous Q24H  . gabapentin  300 mg Oral TID  . insulin aspart  0-9 Units Subcutaneous TID WC  . loratadine  10 mg Oral Daily   Continuous Infusions: . sodium chloride 100 mL/hr at 04/13/18 0616  . cefTRIAXone (ROCEPHIN)  IV 1 g (04/12/18 1224)   PRN Meds:.acetaminophen **OR** acetaminophen, albuterol, hydrALAZINE, HYDROmorphone (DILAUDID) injection, ondansetron **OR** ondansetron (ZOFRAN) IV, oxyCODONE-acetaminophen, polyethylene glycol  Assessment/Plan: Patient Active Problem List   Diagnosis Date Noted  . Acute pyelonephritis 04/10/2018  . Hypertensive urgency 04/10/2018  . Leukocytosis 09/10/2017  . Anemia 09/10/2017  . Knee pain, bilateral  01/13/2015  . Bilateral hip pain 11/23/2014  . Diabetes mellitus type 2, uncontrolled, with complications ([place]) 86/57/8469  . Intertrigo 10/25/2014  . GERD (gastroesophageal reflux disease) 10/25/2014  . Diabetic neuropathy ([place]) 10/25/2014  . Chronic low back pain 10/25/2014  . Current tobacco use 11/11/2013  . Stage 3 chronic kidney disease due to type 2 diabetes mellitus ([place]) 09/02/2013  . Essential (primary) hypertension 09/02/2013  . Gout 12/05/2012   HTN with hypertensive urgency Borderline prolonged QTc HLD T2DM - glucose running in high 100s Asthma - current everyday smoker, uses tobacco and marijuana  GERD CKD stage III ?pyelonephritis Morbid  obesity  Cholelithiasis, biliary colic No evidence of cholecystitis Has low GB ejection fraction on hida But currently asymptomatic   Dispo: Therefore do not recommend cholecystectomy during this admission Discussed signs/symptoms of biliary dyskinesia Can f/u with [facility] as outpt SHOULD she become symptomatic Signing off   LOS: 2 days    Leighton Ruff. Redmond Pulling, MD, FACS General, Bariatric, & Minimally Invasive Surgery [phone] [facility]

## 2018-04-13 NOTE — Progress Notes (Signed)
Pt wanted a sandwich last night. Pt ate all of it and tolerated it well. No c/o any discomfort or nausea.   Eleanora Neighbor, RN

## 2018-04-13 NOTE — Progress Notes (Signed)
Progress Note    Zoe Clarke  IHW:[id number] DOB: 07/03/1950  DOA: 04/10/2018 PCP: Antony Contras, MD    Brief Narrative:   Zoe Clarke is an 68 y.o. female with medical history significant of hypertension, asthma, diabetes, GERD.  Patient presented secondary to right-sided flank pain that started last night.  Pain radiates from right upper quadrant of right flank, a sharp, and persistent.  urine unremarkable but CT shows ? Pyelonephritis, patient also has sludge and gall stones.    04/12/2018: Nuclear imaging of the right upper quadrant revealed low gallbladder EF (18%).  Surgical team has been consulted.  04/13/2018: Patient seen alongside patient's husband and nurse.  Surgery input is appreciated.  Neurosurgery plans for now.  Patient will follow with surgical team on outpatient basis.  Elevated blood pressure noted, with systolic blood pressure greater than 190.  Will discontinue IV fluid.  Will diurese patient.  Will increase clonidine from 0.1 mg p.o. 3 times daily to 0.2 mg p.o. 3 times daily.  Continue to optimize blood pressure.  Likely discharge back home in the morning.  Assessment/Plan:   Principal Problem:   Acute pyelonephritis Active Problems:   Diabetes mellitus type 2, uncontrolled, with complications (HCC)   GERD (gastroesophageal reflux disease)   Diabetic neuropathy (HCC)   Stage 3 chronic kidney disease due to type 2 diabetes mellitus ([place])   Essential (primary) hypertension   Anemia   Hypertensive urgency  RUQ pain/flank pain/groin pain-- ?Acute pyelonephritis -Urinalysis suggests no infection. Urine culture obtained and is pending. -No fevers, white count, tachycardia. -Continue Ceftriaxone -follow Urine culture results -IVF -RUQ U/S shows gallstones and sludge -get HIDA scan 04/12/2018: HIDA scan reveals low gallbladder EF (18%).  Surgical team consulted.  Right upper quadrant tenderness persists. 04/13/2018: No surgery planned for the biliary  dyskinesia.  Patient will follow with the surgical team on discharge.  Diabetes mellitus, type II Last hemoglobin A1c from 2017 of 8.7% per chart review.  Patient follows with [facility] physicians.  Currently on glipizide as an outpatient. -SSI 04/12/2018: Continue to optimize.  Hypertensive urgency Was previously on antihypertensives but stated that they gave her headache.  She was on amlodipine at that time.  Currently does not take any antihypertensives -PRN hydralazine -once patient able to tolerate PO, start regimen for home 04/12/2018: Start Coreg 3.125 mg p.o. twice daily.  Also start clonidine 0.1 mg p.o. 3 times daily. 04/13/2018: Discontinue IV fluids.  IV Lasix 100 mg x 1 dose.  Raised a dose of clonidine from 0.1 mg p.o. 3 times daily to 0.2 mg p.o. 3 times daily.  Need to monitor closely.  CKD stage III Appears to be stable. -Renally dose medication  Diabetic neuropathy Patient is on gabapentin 900 mg 3 times daily as an outpatient.  Patient with history of CKD with current GFR restricting current gabapentin dose -Reduce to gabapentin 300 mg 3 times daily  Hyperlipidemia -Continue Lipitor 40 mg daily  Anemia Monitor -7 months ago, her Hgb was 12 -in the meantime, she has seen Dr. Burr Medico with B12/iron/folate studies  Borderline prolonged QTc -Qtc 471 on 1/24  Morbid obesity Estimated body mass index is 39.99 kg/m as calculated from the following:   Height as of this encounter: 5\' 8"  (1.727 m).   Weight as of this encounter: 119.3 kg.  Family Communication/Anticipated D/C date and plan/Code Status   DVT prophylaxis: Lovenox ordered. Code Status: Full Code.  Family Communication:  Disposition Plan: not improved despite abx, needs  continued IVF as not able to keep down fluids/food, will get HIDA scan to r/o GB etiology   Medical Consultants:    None.   Subjective:   Patient looks a lot better today. Right upper quadrant pain has improved  significantly.  Objective:    Vitals:   04/12/18 2100 04/13/18 0617 04/13/18 0618 04/13/18 0915  BP: (!) 181/63  (!) 134/92 (!) 206/72  Pulse: (!) 59 79 75 80  Resp: (!) 21 20  18   Temp: 98 F (36.7 C) 98.9 F (37.2 C)    TempSrc: Oral Oral    SpO2: 99% 93% 94% 97%  Weight: 119.3 kg     Height:        Intake/Output Summary (Last 24 hours) at 04/13/2018 1000 Last data filed at 04/13/2018 0600 Gross per 24 hour  Intake 1160 ml  Output 300 ml  Net 860 ml   Filed Weights   04/11/18 0515 04/11/18 2022 04/12/18 2100  Weight: 115.9 kg 119.3 kg 119.3 kg    Exam: Condition: Obese.  Not in any significant distress.   Neck: Supple.  JVD is difficult to assess.   Lungs: Clear to auscultation.   CVS: S1-S2.   Abdomen: Right upper quadrant tenderness has improved significantly.  Organs are difficult to assess.   Neuro: Awake and alert.  Patient seems to move all extremities.     Data Reviewed:   I have personally reviewed following labs and imaging studies:  Labs: Labs show the following:   Basic Metabolic Panel: Recent Labs  Lab 04/10/18 0847 04/11/18 0543 04/13/18 0621  NA 139 139 139  K 4.5 4.1 4.9  CL 107 110 111  CO2 24 24 19*  GLUCOSE 190* 133* 107*  BUN 10 11 15   CREATININE 1.63* 1.70* 2.22*  CALCIUM 8.4* 8.3* 8.1*   GFR Estimated Creatinine Clearance: 33.9 mL/min (A) (by C-G formula based on SCr of 2.22 mg/dL (H)). Liver Function Tests: Recent Labs  Lab 04/10/18 0847 04/13/18 0621  AST 14* 15  ALT 13 12  ALKPHOS 90 72  BILITOT 0.8 0.4  PROT 6.5 5.9*  ALBUMIN 2.9* 2.6*   Recent Labs  Lab 04/10/18 0847  LIPASE 24   No results for input(s): AMMONIA in the last 168 hours. Coagulation profile No results for input(s): INR, PROTIME in the last 168 hours.  CBC: Recent Labs  Lab 04/10/18 0847 04/11/18 0543 04/13/18 0621  WBC 9.6 10.0 11.0*  NEUTROABS 7.6  --   --   HGB 9.8* 9.3* 9.7*  HCT 31.7* 30.4* 30.0*  MCV 87.1 87.1 86.5  PLT 242 235  236   Cardiac Enzymes: No results for input(s): CKTOTAL, CKMB, CKMBINDEX, TROPONINI in the last 168 hours. BNP (last 3 results) No results for input(s): PROBNP in the last 8760 hours. CBG: Recent Labs  Lab 04/12/18 0726 04/12/18 1119 04/12/18 1614 04/12/18 2100 04/13/18 0717  GLUCAP 184* 146* 113* 118* 113*   D-Dimer: No results for input(s): DDIMER in the last 72 hours. Hgb A1c: No results for input(s): HGBA1C in the last 72 hours. Lipid Profile: No results for input(s): CHOL, HDL, LDLCALC, TRIG, CHOLHDL, LDLDIRECT in the last 72 hours. Thyroid function studies: No results for input(s): TSH, T4TOTAL, T3FREE, THYROIDAB in the last 72 hours.  Invalid input(s): FREET3 Anemia work up: No results for input(s): VITAMINB12, FOLATE, FERRITIN, TIBC, IRON, RETICCTPCT in the last 72 hours. Sepsis Labs: Recent Labs  Lab 04/10/18 0847 04/11/18 0543 04/13/18 0621  WBC 9.6 10.0 11.0*  Microbiology Recent Results (from the past 240 hour(s))  Urine culture     Status: Abnormal   Collection Time: 04/10/18  8:47 AM  Result Value Ref Range Status   Specimen Description URINE, RANDOM  Final   Special Requests   Final    NONE Performed at [facility], [address], [place], [place] [address]    Culture MULTIPLE SPECIES PRESENT, SUGGEST RECOLLECTION (A)  Final   Report Status 04/11/2018 FINAL  Final    Procedures and diagnostic studies:  Nm Hepato W/eject Fract  Result Date: 04/11/2018 CLINICAL DATA:  68 y/o F; right upper quadrant and right flank pain. EXAM: NUCLEAR MEDICINE HEPATOBILIARY IMAGING WITH GALLBLADDER EF TECHNIQUE: Sequential images of the abdomen were obtained out to 60 minutes following intravenous administration of radiopharmaceutical. After oral ingestion of Ensure, gallbladder ejection fraction was determined. At 60 min, normal ejection fraction is greater than 33%. RADIOPHARMACEUTICALS:  5.25 mCi Tc-74m  Choletec IV COMPARISON:  04/10/2018 abdominal  ultrasound FINDINGS: Prompt uptake and biliary excretion of activity by the liver is seen. Gallbladder activity is visualized, consistent with patency of cystic duct. Biliary activity passes into small bowel, consistent with patent common bile duct. Calculated gallbladder ejection fraction is 18%. (Normal gallbladder ejection fraction with Ensure is greater than 33%.) IMPRESSION: 1. Patent cystic and common bile ducts. 2. Low gallbladder ejection fraction of 18%. Electronically Signed   By: Kristine Garbe M.D.   On: 04/11/2018 16:56    Medications:   . atorvastatin  40 mg Oral q1800  . carvedilol  3.125 mg Oral BID WC  . cloNIDine  0.1 mg Oral TID  . enoxaparin (LOVENOX) injection  40 mg Subcutaneous Q24H  . gabapentin  300 mg Oral TID  . insulin aspart  0-9 Units Subcutaneous TID WC  . loratadine  10 mg Oral Daily   Continuous Infusions: . cefTRIAXone (ROCEPHIN)  IV 1 g (04/12/18 1224)  . furosemide       LOS: 2 days   Bon[facility] Public  [facility]   *Please refer to [email or web address], password [email or web address] to get updated schedule on who will round on this patient, as hospitalists switch teams weekly. If 7PM-7AM, please contact night-coverage at [email or web address], password [email or web address] for any overnight needs.  04/13/2018, 10:00 AM

## 2018-04-13 NOTE — Progress Notes (Signed)
Pts. B/P is 190/60 manually. MD notified and made aware. Will continue to monitor.

## 2018-04-14 LAB — URINE CULTURE: Culture: <10000 — AB

## 2018-04-14 LAB — GLUCOSE, CAPILLARY
Glucose-Capillary: 135 mg/dL — ABNORMAL HIGH (ref 70–99)
Glucose-Capillary: 170 mg/dL — ABNORMAL HIGH (ref 70–99)

## 2018-04-14 MED ORDER — CARVEDILOL 3.125 MG PO TABS: 3.1250 mg | ORAL_TABLET | Freq: Two times a day (BID) | ORAL | 0 refills | Status: DC

## 2018-04-14 MED ORDER — POLYETHYLENE GLYCOL 3350 17 G PO PACK: 17.0000 g | PACK | Freq: Every day | ORAL | 0 refills | Status: AC | PRN

## 2018-04-14 MED ORDER — GABAPENTIN 300 MG PO CAPS: 300.0000 mg | ORAL_CAPSULE | Freq: Three times a day (TID) | ORAL | 0 refills | Status: DC

## 2018-04-14 MED ORDER — CLONIDINE HCL 0.2 MG PO TABS: 0.2000 mg | ORAL_TABLET | Freq: Three times a day (TID) | ORAL | 0 refills | Status: DC

## 2018-04-14 MED ORDER — OXYCODONE-ACETAMINOPHEN 5-325 MG PO TABS: 1.0000 | ORAL_TABLET | Freq: Four times a day (QID) | ORAL | 0 refills | Status: DC | PRN

## 2018-04-14 MED ORDER — ALBUTEROL SULFATE HFA 108 (90 BASE) MCG/ACT IN AERS: 2.0000 | INHALATION_SPRAY | Freq: Four times a day (QID) | RESPIRATORY_TRACT | 0 refills | Status: AC | PRN

## 2018-04-14 MED ORDER — CEPHALEXIN 250 MG PO CAPS: 250.0000 mg | ORAL_CAPSULE | Freq: Four times a day (QID) | ORAL | 0 refills | Status: AC

## 2018-04-14 NOTE — Care Management Note (Addendum)
Case Management Note Manya Silvas, RN MSN CCM Transitions of Care [address] [place] [phone]  Patient Details  Name: Zoe Clarke MRN: [id number] Date of Birth: [birth year]  Subjective/Objective:          Acute pyelonephritis    Action/Plan: PTA Home with spouse who is at bedside. Uses [facility] to get to medical appointments. Pharmacy delivers meds. Sees PCP at regular intervals and states that she has next appointment 03/24/2018. States that they will call a cab to transport home today. Bedside RN has provided AVS. No transition of care needs identified.   Expected Discharge Date:  04/14/18               Expected Discharge Plan:  Home/Self Care  In-House Referral:  NA  Discharge planning Services  CM Consult  Post Acute Care Choice:  NA Choice offered to:  NA  DME Arranged:  N/A DME Agency:  NA  HH Arranged:  NA HH Agency:  NA  Status of Service:  Completed, signed off  If discussed at [place] of Stay Meetings, dates discussed:    Additional Comments: 04/14/2018-Notified that patient was in need of RW. MD notified for order. [facility] made aware.   Bartholomew Crews, RN 04/14/2018, 1:40 PM

## 2018-04-14 NOTE — Discharge Summary (Signed)
Physician Discharge Summary  Patient ID: Zoe Clarke MRN: [id number] DOB/AGE: July 06, 1950 68 y.o.  Admit date: 04/10/2018 Discharge date: 04/14/2018  Admission Diagnoses:  Discharge Diagnoses:  Principal Problem:   Acute pyelonephritis Active Problems:   Diabetes mellitus type 2, uncontrolled, with complications ([place])   GERD (gastroesophageal reflux disease)   Diabetic neuropathy (HCC)   Stage 3 chronic kidney disease due to type 2 diabetes mellitus ([place])   Essential (primary) hypertension   Anemia   Hypertensive urgency   Discharged Condition: stable  Hospital Course:  Zoe Clarke is a 68 year old female with past medical history significant for hypertension, asthma, diabetes and GERD.Patient presented with right-sided flank pain that started the night prior to presentation.  Urinalysis was unremarkable, however, CT scan revealed possible pyelonephritis.  Patient also has sludge and gall stones.  Patient was admitted for further assessment and management.  Nuclear imaging of the right upper quadrant revealed low gallbladder EF (18%).  Surgical team was consulted to assist with patient's management.  Patient was seen by the surgical team, and no surgical intervention was advised.  Patient will follow with the surgical team on outpatient basis.  RUQ pain/flank pain/groin pain: Possible acute pyelonephritis was considered. However, urinalysis was not suggestive of infection. Urine culture is pending. -No fevers, white count, tachycardia. -Patient was treated with ceftriaxone.   -PCP to follow final urine culture results.   -RUQ U/S revealed gallstones and sludge  HIDA scan revealed low gallbladder EF (18%).   Surgical team consulted as right upper quadrant tenderness persists. No surgery planned for the biliary dyskinesia.   Patient will follow with the surgical team on discharge.  Diabetes mellitus, type II: Last hemoglobin A1c from 2017 was 8.7% as per chart  review.  Patient was managed with sliding scale insulin coverage during the hospital stay.   PCP will continue to optimize patient's blood sugar control.   Hypertensive urgency: Patient was previously on antihypertensives but stated that they gave her headache.  Patient was on amlodipine at the time.  Patient was not on antihypertensives prior to admission.   Will start patient on Coreg 3.125 mg p.o. twice daily, as well as, clonidine 0.1 mg p.o. 3 times daily. PCP to monitor and optimize blood pressure control on discharge.  CKD stage III -Appears to be stable.  Diabetic neuropathy Patient is on gabapentin 900 mg 3 times daily as an outpatient.  Will decrease the dose of gabapentin to 300 mg orally 3 times daily  Hyperlipidemia -Continue Lipitor 40 mg daily  Anemia Monitor  Borderline prolonged QTc -Qtc was 471 milliseconds on 04/11/2018.   -Continue to monitor closely.  Morbid obesity Estimated body mass index is 39.99 kg/m  Diet and exercise. Need to optimize weight discussed with patient extensively.  Consults: None  Discharge Exam: Blood pressure (!) 142/46, pulse (!) 57, temperature 98.2 F (36.8 C), resp. rate 17, height 5\' 8"  (1.727 m), weight 119.3 kg, SpO2 94 %.  Disposition: Discharge disposition: 01-Home or Self Care   Discharge Instructions    Diet - low sodium heart healthy   Complete by:  As directed    Discharge instructions   Complete by:  As directed    Please check your blood pressure 4 times daily, document readings and review with your primary care provider in a week.  Call your PCP for systolic blood pressure greater than 170 or less than 110 mmHg.   Increase activity slowly   Complete by:  As directed  Allergies as of 04/14/2018      Reactions   Aspirin Other (See Comments)   Feels funny    Allopurinol    Dizzy; hard to ambulate  Couldn't see; sweating   Amlodipine Other (See Comments)   headache   Lisinopril Swelling    Face swelling      Medication List    STOP taking these medications   ALKA-SELTZER PLUS COLD & COUGH PO   cyclobenzaprine 5 MG tablet Commonly known as:  FLEXERIL   predniSONE 20 MG tablet Commonly known as:  DELTASONE   ranitidine 150 MG tablet Commonly known as:  ZANTAC   triamcinolone cream 0.1 % Commonly known as:  KENALOG   TYLENOL PM EXTRA STRENGTH 25-500 MG Tabs tablet Generic drug:  diphenhydramine-acetaminophen     TAKE these medications   albuterol 108 (90 Base) MCG/ACT inhaler Commonly known as:  PROVENTIL HFA;VENTOLIN HFA Inhale 2 puffs into the lungs every 6 (six) hours as needed for wheezing or shortness of breath.   atorvastatin 40 MG tablet Commonly known as:  LIPITOR Take 40 mg by mouth daily.   carvedilol 3.125 MG tablet Commonly known as:  COREG Take 1 tablet (3.125 mg total) by mouth 2 (two) times daily with a meal.   cephALEXin 250 MG capsule Commonly known as:  KEFLEX Take 1 capsule (250 mg total) by mouth 4 (four) times daily for 5 days.   cloNIDine 0.2 MG tablet Commonly known as:  CATAPRES Take 1 tablet (0.2 mg total) by mouth 3 (three) times daily.   fexofenadine 180 MG tablet Commonly known as:  ALLEGRA Take 180 mg by mouth daily.   gabapentin 300 MG capsule Commonly known as:  NEURONTIN Take 1 capsule (300 mg total) by mouth 3 (three) times daily. What changed:  how much to take   glipiZIDE 10 MG tablet Commonly known as:  GLUCOTROL Take 1 tablet (10 mg total) by mouth 2 (two) times daily before a meal.   LINZESS 145 MCG Caps capsule Generic drug:  linaclotide Take 145 mcg by mouth as needed (constipation).   oxyCODONE-acetaminophen 5-325 MG tablet Commonly known as:  PERCOCET/ROXICET Take 1-2 tablets by mouth every 6 (six) hours as needed for moderate pain.   polyethylene glycol packet Commonly known as:  MIRALAX / GLYCOLAX Take 17 g by mouth daily as needed for mild constipation.        SignedBonnell Public 04/14/2018, 12:30 PM

## 2018-04-14 NOTE — Care Management Important Message (Signed)
Important Message  Patient Details  Name: Zoe Clarke MRN: [id number] Date of Birth: December 13, 1950   Medicare Important Message Given:  Yes    Tommy Medal 04/14/2018, 4:28 PM

## 2018-04-19 DIAGNOSIS — I5032 Chronic diastolic (congestive) heart failure: Secondary | ICD-10-CM

## 2018-04-19 HISTORY — DX: Chronic diastolic (congestive) heart failure: I50.32

## 2018-04-27 ENCOUNTER — Emergency Department (HOSPITAL_COMMUNITY): Payer: PPO

## 2018-04-27 ENCOUNTER — Encounter (HOSPITAL_COMMUNITY): Payer: Self-pay | Admitting: Emergency Medicine

## 2018-04-27 ENCOUNTER — Inpatient Hospital Stay (HOSPITAL_COMMUNITY)
Admission: EM | Admit: 2018-04-27 | Discharge: 2018-05-01 | DRG: 291 | Disposition: A | Discharge status: Home / Self Care | Payer: PPO | Attending: Internal Medicine | Admitting: Internal Medicine

## 2018-04-27 DIAGNOSIS — F17210 Nicotine dependence, cigarettes, uncomplicated: Secondary | ICD-10-CM | POA: Diagnosis present

## 2018-04-27 DIAGNOSIS — Z8711 Personal history of peptic ulcer disease: Secondary | ICD-10-CM

## 2018-04-27 DIAGNOSIS — M25561 Pain in right knee: Secondary | ICD-10-CM | POA: Diagnosis not present

## 2018-04-27 DIAGNOSIS — R1011 Right upper quadrant pain: Secondary | ICD-10-CM

## 2018-04-27 DIAGNOSIS — R112 Nausea with vomiting, unspecified: Secondary | ICD-10-CM | POA: Diagnosis not present

## 2018-04-27 DIAGNOSIS — K824 Cholesterolosis of gallbladder: Secondary | ICD-10-CM | POA: Diagnosis not present

## 2018-04-27 DIAGNOSIS — Z823 Family history of stroke: Secondary | ICD-10-CM | POA: Diagnosis not present

## 2018-04-27 DIAGNOSIS — Z886 Allergy status to analgesic agent status: Secondary | ICD-10-CM

## 2018-04-27 DIAGNOSIS — Z8249 Family history of ischemic heart disease and other diseases of the circulatory system: Secondary | ICD-10-CM | POA: Diagnosis not present

## 2018-04-27 DIAGNOSIS — Z716 Tobacco abuse counseling: Secondary | ICD-10-CM

## 2018-04-27 DIAGNOSIS — G9341 Metabolic encephalopathy: Secondary | ICD-10-CM | POA: Diagnosis not present

## 2018-04-27 DIAGNOSIS — N183 Chronic kidney disease, stage 3 unspecified: Secondary | ICD-10-CM | POA: Diagnosis present

## 2018-04-27 DIAGNOSIS — R0902 Hypoxemia: Secondary | ICD-10-CM | POA: Diagnosis present

## 2018-04-27 DIAGNOSIS — Z888 Allergy status to other drugs, medicaments and biological substances status: Secondary | ICD-10-CM | POA: Diagnosis not present

## 2018-04-27 DIAGNOSIS — E118 Type 2 diabetes mellitus with unspecified complications: Secondary | ICD-10-CM

## 2018-04-27 DIAGNOSIS — K8010 Calculus of gallbladder with chronic cholecystitis without obstruction: Secondary | ICD-10-CM | POA: Diagnosis present

## 2018-04-27 DIAGNOSIS — Z79899 Other long term (current) drug therapy: Secondary | ICD-10-CM

## 2018-04-27 DIAGNOSIS — I10 Essential (primary) hypertension: Secondary | ICD-10-CM | POA: Diagnosis not present

## 2018-04-27 DIAGNOSIS — G8929 Other chronic pain: Secondary | ICD-10-CM | POA: Diagnosis not present

## 2018-04-27 DIAGNOSIS — K219 Gastro-esophageal reflux disease without esophagitis: Secondary | ICD-10-CM | POA: Diagnosis not present

## 2018-04-27 DIAGNOSIS — D649 Anemia, unspecified: Secondary | ICD-10-CM

## 2018-04-27 DIAGNOSIS — E119 Type 2 diabetes mellitus without complications: Secondary | ICD-10-CM | POA: Diagnosis present

## 2018-04-27 DIAGNOSIS — R1084 Generalized abdominal pain: Secondary | ICD-10-CM | POA: Diagnosis not present

## 2018-04-27 DIAGNOSIS — I130 Hypertensive heart and chronic kidney disease with heart failure and stage 1 through stage 4 chronic kidney disease, or unspecified chronic kidney disease: Secondary | ICD-10-CM | POA: Diagnosis not present

## 2018-04-27 DIAGNOSIS — R609 Edema, unspecified: Secondary | ICD-10-CM | POA: Diagnosis not present

## 2018-04-27 DIAGNOSIS — J449 Chronic obstructive pulmonary disease, unspecified: Secondary | ICD-10-CM | POA: Diagnosis present

## 2018-04-27 DIAGNOSIS — R0602 Shortness of breath: Secondary | ICD-10-CM | POA: Diagnosis not present

## 2018-04-27 DIAGNOSIS — Z96659 Presence of unspecified artificial knee joint: Secondary | ICD-10-CM | POA: Diagnosis not present

## 2018-04-27 DIAGNOSIS — E1165 Type 2 diabetes mellitus with hyperglycemia: Secondary | ICD-10-CM | POA: Diagnosis present

## 2018-04-27 DIAGNOSIS — Z6841 Body Mass Index (BMI) 40.0 and over, adult: Secondary | ICD-10-CM

## 2018-04-27 DIAGNOSIS — D631 Anemia in chronic kidney disease: Secondary | ICD-10-CM | POA: Diagnosis present

## 2018-04-27 DIAGNOSIS — K5900 Constipation, unspecified: Secondary | ICD-10-CM | POA: Diagnosis not present

## 2018-04-27 DIAGNOSIS — R079 Chest pain, unspecified: Secondary | ICD-10-CM | POA: Diagnosis not present

## 2018-04-27 DIAGNOSIS — I5021 Acute systolic (congestive) heart failure: Secondary | ICD-10-CM

## 2018-04-27 DIAGNOSIS — K819 Cholecystitis, unspecified: Secondary | ICD-10-CM | POA: Diagnosis present

## 2018-04-27 DIAGNOSIS — D638 Anemia in other chronic diseases classified elsewhere: Secondary | ICD-10-CM | POA: Diagnosis present

## 2018-04-27 DIAGNOSIS — Z9660 Presence of unspecified orthopedic joint implant: Secondary | ICD-10-CM | POA: Diagnosis not present

## 2018-04-27 DIAGNOSIS — E1122 Type 2 diabetes mellitus with diabetic chronic kidney disease: Secondary | ICD-10-CM | POA: Diagnosis not present

## 2018-04-27 DIAGNOSIS — Z8261 Family history of arthritis: Secondary | ICD-10-CM | POA: Diagnosis not present

## 2018-04-27 DIAGNOSIS — R062 Wheezing: Secondary | ICD-10-CM | POA: Diagnosis not present

## 2018-04-27 DIAGNOSIS — I5041 Acute combined systolic (congestive) and diastolic (congestive) heart failure: Secondary | ICD-10-CM | POA: Diagnosis present

## 2018-04-27 DIAGNOSIS — I351 Nonrheumatic aortic (valve) insufficiency: Secondary | ICD-10-CM | POA: Diagnosis not present

## 2018-04-27 DIAGNOSIS — E1140 Type 2 diabetes mellitus with diabetic neuropathy, unspecified: Secondary | ICD-10-CM | POA: Diagnosis present

## 2018-04-27 DIAGNOSIS — M545 Low back pain: Secondary | ICD-10-CM | POA: Diagnosis not present

## 2018-04-27 DIAGNOSIS — R1013 Epigastric pain: Secondary | ICD-10-CM | POA: Diagnosis not present

## 2018-04-27 DIAGNOSIS — Z7984 Long term (current) use of oral hypoglycemic drugs: Secondary | ICD-10-CM

## 2018-04-27 DIAGNOSIS — I509 Heart failure, unspecified: Secondary | ICD-10-CM | POA: Diagnosis not present

## 2018-04-27 DIAGNOSIS — Z79891 Long term (current) use of opiate analgesic: Secondary | ICD-10-CM

## 2018-04-27 LAB — CBC WITH DIFFERENTIAL/PLATELET
Abs Immature Granulocytes: 0.06 10*3/uL (ref 0.00–0.07)
Basophils Absolute: 0 10*3/uL (ref 0.0–0.1)
Basophils Relative: 0 %
Eosinophils Absolute: 0.4 10*3/uL (ref 0.0–0.5)
Eosinophils Relative: 3 %
HCT: 30 % — ABNORMAL LOW (ref 36.0–46.0)
Hemoglobin: 9 g/dL — ABNORMAL LOW (ref 12.0–15.0)
Immature Granulocytes: 1 %
Lymphocytes Relative: 22 %
Lymphs Abs: 2.6 10*3/uL (ref 0.7–4.0)
MCH: 26.5 pg (ref 26.0–34.0)
MCHC: 30 g/dL (ref 30.0–36.0)
MCV: 88.2 fL (ref 80.0–100.0)
Monocytes Absolute: 0.7 10*3/uL (ref 0.1–1.0)
Monocytes Relative: 6 %
NEUTROS ABS: 7.9 10*3/uL — AB (ref 1.7–7.7)
Neutrophils Relative %: 68 %
Platelets: 231 10*3/uL (ref 150–400)
RBC: 3.4 MIL/uL — ABNORMAL LOW (ref 3.87–5.11)
RDW: 14.6 % (ref 11.5–15.5)
WBC: 11.7 10*3/uL — ABNORMAL HIGH (ref 4.0–10.5)
nRBC: 0 % (ref 0.0–0.2)

## 2018-04-27 LAB — COMPREHENSIVE METABOLIC PANEL
ALT: 19 U/L (ref 0–44)
AST: 15 U/L (ref 15–41)
Albumin: 2.9 g/dL — ABNORMAL LOW (ref 3.5–5.0)
Alkaline Phosphatase: 87 U/L (ref 38–126)
Anion gap: 9 (ref 5–15)
BILIRUBIN TOTAL: 0.6 mg/dL (ref 0.3–1.2)
BUN: 14 mg/dL (ref 8–23)
CO2: 26 mmol/L (ref 22–32)
Calcium: 8.6 mg/dL — ABNORMAL LOW (ref 8.9–10.3)
Chloride: 105 mmol/L (ref 98–111)
Creatinine, Ser: 1.66 mg/dL — ABNORMAL HIGH (ref 0.44–1.00)
GFR calc Af Amer: 37 mL/min — ABNORMAL LOW (ref >60)
GFR calc non Af Amer: 32 mL/min — ABNORMAL LOW (ref >60)
Glucose, Bld: 194 mg/dL — ABNORMAL HIGH (ref 70–99)
Potassium: 4.3 mmol/L (ref 3.5–5.1)
Sodium: 140 mmol/L (ref 135–145)
Total Protein: 6.6 g/dL (ref 6.5–8.1)

## 2018-04-27 LAB — URINALYSIS, ROUTINE W REFLEX MICROSCOPIC
Bacteria, UA: NONE SEEN
Bilirubin Urine: NEGATIVE
Glucose, UA: 50 mg/dL — AB
Ketones, ur: NEGATIVE mg/dL
Leukocytes, UA: NEGATIVE
Nitrite: NEGATIVE
Protein, ur: 100 mg/dL — AB
Specific Gravity, Urine: 1.012 (ref 1.005–1.030)
pH: 7 (ref 5.0–8.0)

## 2018-04-27 LAB — BRAIN NATRIURETIC PEPTIDE: B Natriuretic Peptide: 630.3 pg/mL — ABNORMAL HIGH (ref 0.0–100.0)

## 2018-04-27 LAB — TROPONIN I
Troponin I: <0.03 ng/mL (ref <0.03)
Troponin I: <0.03 ng/mL (ref <0.03)

## 2018-04-27 LAB — CREATININE, SERUM
Creatinine, Ser: 1.64 mg/dL — ABNORMAL HIGH (ref 0.44–1.00)
GFR calc Af Amer: 37 mL/min — ABNORMAL LOW (ref >60)
GFR calc non Af Amer: 32 mL/min — ABNORMAL LOW (ref >60)

## 2018-04-27 LAB — HEMOGLOBIN A1C
Hgb A1c MFr Bld: 8.2 % — ABNORMAL HIGH (ref 4.8–5.6)
Mean Plasma Glucose: 188.64 mg/dL

## 2018-04-27 LAB — TSH: TSH: 3.871 u[IU]/mL (ref 0.350–4.500)

## 2018-04-27 LAB — CBG MONITORING, ED: GLUCOSE-CAPILLARY: 179 mg/dL — AB (ref 70–99)

## 2018-04-27 LAB — CBC
HCT: 27.6 % — ABNORMAL LOW (ref 36.0–46.0)
Hemoglobin: 8.3 g/dL — ABNORMAL LOW (ref 12.0–15.0)
MCH: 26.8 pg (ref 26.0–34.0)
MCHC: 30.1 g/dL (ref 30.0–36.0)
MCV: 89 fL (ref 80.0–100.0)
Platelets: 219 10*3/uL (ref 150–400)
RBC: 3.1 MIL/uL — ABNORMAL LOW (ref 3.87–5.11)
RDW: 14.5 % (ref 11.5–15.5)
WBC: 10.6 10*3/uL — ABNORMAL HIGH (ref 4.0–10.5)
nRBC: 0 % (ref 0.0–0.2)

## 2018-04-27 LAB — GLUCOSE, CAPILLARY: Glucose-Capillary: 221 mg/dL — ABNORMAL HIGH (ref 70–99)

## 2018-04-27 LAB — LIPASE, BLOOD: LIPASE: 22 U/L (ref 11–51)

## 2018-04-27 LAB — MAGNESIUM: Magnesium: 2 mg/dL (ref 1.7–2.4)

## 2018-04-27 LAB — I-STAT TROPONIN, ED: Troponin i, poc: 0.02 ng/mL (ref 0.00–0.08)

## 2018-04-27 MED ORDER — FUROSEMIDE 10 MG/ML IJ SOLN
20.0000 mg | Freq: Once | INTRAMUSCULAR | Status: AC
  Administered 2018-04-27: 20 mg via INTRAVENOUS
  Filled 2018-04-27: qty 2

## 2018-04-27 MED ORDER — CEPHALEXIN 250 MG PO CAPS
250.0000 mg | ORAL_CAPSULE | Freq: Four times a day (QID) | ORAL | Status: DC
  Administered 2018-04-27 – 2018-04-28 (×2): 250 mg via ORAL
  Filled 2018-04-27 (×2): qty 1

## 2018-04-27 MED ORDER — ISOSORB DINITRATE-HYDRALAZINE 20-37.5 MG PO TABS
1.0000 | ORAL_TABLET | Freq: Three times a day (TID) | ORAL | Status: DC
  Administered 2018-04-27 – 2018-05-01 (×11): 1 via ORAL
  Filled 2018-04-27 (×13): qty 1

## 2018-04-27 MED ORDER — OXYCODONE-ACETAMINOPHEN 5-325 MG PO TABS
1.0000 | ORAL_TABLET | Freq: Four times a day (QID) | ORAL | Status: DC | PRN
  Administered 2018-04-28: 2 via ORAL
  Administered 2018-04-28: 1 via ORAL
  Administered 2018-04-29: 2 via ORAL
  Filled 2018-04-27 (×2): qty 2
  Filled 2018-04-27: qty 1
  Filled 2018-04-27: qty 2

## 2018-04-27 MED ORDER — SODIUM CHLORIDE 0.9 % IV SOLN: 250.0000 mL | INTRAVENOUS | Status: DC | PRN

## 2018-04-27 MED ORDER — SODIUM CHLORIDE 0.9% FLUSH
3.0000 mL | Freq: Two times a day (BID) | INTRAVENOUS | Status: DC
  Administered 2018-04-27 – 2018-04-30 (×6): 3 mL via INTRAVENOUS

## 2018-04-27 MED ORDER — ONDANSETRON HCL 4 MG/2ML IJ SOLN
4.0000 mg | Freq: Once | INTRAMUSCULAR | Status: AC
  Administered 2018-04-27: 4 mg via INTRAVENOUS
  Filled 2018-04-27: qty 2

## 2018-04-27 MED ORDER — POLYETHYLENE GLYCOL 3350 17 G PO PACK
17.0000 g | PACK | Freq: Every day | ORAL | Status: DC | PRN
  Filled 2018-04-27: qty 1

## 2018-04-27 MED ORDER — CARVEDILOL 3.125 MG PO TABS
3.1250 mg | ORAL_TABLET | Freq: Two times a day (BID) | ORAL | Status: DC
  Administered 2018-04-27 – 2018-05-01 (×8): 3.125 mg via ORAL
  Filled 2018-04-27 (×10): qty 1

## 2018-04-27 MED ORDER — SODIUM CHLORIDE 0.9% FLUSH: 3.0000 mL | INTRAVENOUS | Status: DC | PRN

## 2018-04-27 MED ORDER — ALBUTEROL SULFATE HFA 108 (90 BASE) MCG/ACT IN AERS: 2.0000 | INHALATION_SPRAY | Freq: Four times a day (QID) | RESPIRATORY_TRACT | Status: DC | PRN

## 2018-04-27 MED ORDER — ASPIRIN EC 81 MG PO TBEC
81.0000 mg | DELAYED_RELEASE_TABLET | Freq: Every day | ORAL | Status: DC
  Administered 2018-04-27 – 2018-04-30 (×4): 81 mg via ORAL
  Filled 2018-04-27 (×4): qty 1

## 2018-04-27 MED ORDER — MORPHINE SULFATE (PF) 4 MG/ML IV SOLN
4.0000 mg | Freq: Once | INTRAVENOUS | Status: AC
  Administered 2018-04-27: 4 mg via INTRAVENOUS
  Filled 2018-04-27: qty 1

## 2018-04-27 MED ORDER — LORATADINE 10 MG PO TABS
10.0000 mg | ORAL_TABLET | Freq: Every day | ORAL | Status: DC
  Administered 2018-04-27 – 2018-05-01 (×5): 10 mg via ORAL
  Filled 2018-04-27 (×5): qty 1

## 2018-04-27 MED ORDER — GLIPIZIDE 10 MG PO TABS
10.0000 mg | ORAL_TABLET | Freq: Two times a day (BID) | ORAL | Status: DC
  Administered 2018-04-28: 10 mg via ORAL
  Filled 2018-04-27 (×3): qty 1

## 2018-04-27 MED ORDER — INSULIN ASPART 100 UNIT/ML ~~LOC~~ SOLN: 0.0000 [IU] | Freq: Three times a day (TID) | SUBCUTANEOUS | Status: DC

## 2018-04-27 MED ORDER — CYCLOBENZAPRINE HCL 5 MG PO TABS
5.0000 mg | ORAL_TABLET | Freq: Every day | ORAL | Status: DC
  Administered 2018-04-27 – 2018-04-28 (×2): 5 mg via ORAL
  Filled 2018-04-27 (×2): qty 1

## 2018-04-27 MED ORDER — ENOXAPARIN SODIUM 40 MG/0.4ML ~~LOC~~ SOLN
40.0000 mg | SUBCUTANEOUS | Status: DC
  Administered 2018-04-27 – 2018-04-30 (×4): 40 mg via SUBCUTANEOUS
  Filled 2018-04-27 (×4): qty 0.4

## 2018-04-27 MED ORDER — ALPRAZOLAM 0.25 MG PO TABS
0.2500 mg | ORAL_TABLET | Freq: Two times a day (BID) | ORAL | Status: DC | PRN
  Administered 2018-04-27: 0.25 mg via ORAL
  Filled 2018-04-27: qty 1

## 2018-04-27 MED ORDER — POTASSIUM CHLORIDE CRYS ER 10 MEQ PO TBCR
10.0000 meq | EXTENDED_RELEASE_TABLET | Freq: Two times a day (BID) | ORAL | Status: DC
  Administered 2018-04-27 – 2018-04-29 (×5): 10 meq via ORAL
  Filled 2018-04-27 (×5): qty 1

## 2018-04-27 MED ORDER — ACETAMINOPHEN 325 MG PO TABS
650.0000 mg | ORAL_TABLET | ORAL | Status: DC | PRN
  Administered 2018-04-29 – 2018-05-01 (×4): 650 mg via ORAL
  Filled 2018-04-27 (×4): qty 2

## 2018-04-27 MED ORDER — ALBUTEROL SULFATE (2.5 MG/3ML) 0.083% IN NEBU
2.5000 mg | INHALATION_SOLUTION | Freq: Once | RESPIRATORY_TRACT | Status: AC
  Administered 2018-04-27: 2.5 mg via RESPIRATORY_TRACT
  Filled 2018-04-27: qty 3

## 2018-04-27 MED ORDER — ONDANSETRON HCL 4 MG/2ML IJ SOLN
4.0000 mg | Freq: Four times a day (QID) | INTRAMUSCULAR | Status: DC | PRN
  Administered 2018-04-30: 4 mg via INTRAVENOUS
  Filled 2018-04-27: qty 2

## 2018-04-27 MED ORDER — ALBUTEROL SULFATE (2.5 MG/3ML) 0.083% IN NEBU
2.5000 mg | INHALATION_SOLUTION | Freq: Four times a day (QID) | RESPIRATORY_TRACT | Status: DC | PRN
  Administered 2018-04-27 – 2018-04-28 (×2): 2.5 mg via RESPIRATORY_TRACT
  Filled 2018-04-27 (×3): qty 3

## 2018-04-27 MED ORDER — ATORVASTATIN CALCIUM 40 MG PO TABS
40.0000 mg | ORAL_TABLET | Freq: Every day | ORAL | Status: DC
  Administered 2018-04-27 – 2018-05-01 (×5): 40 mg via ORAL
  Filled 2018-04-27 (×5): qty 1

## 2018-04-27 MED ORDER — GABAPENTIN 300 MG PO CAPS
300.0000 mg | ORAL_CAPSULE | Freq: Three times a day (TID) | ORAL | Status: DC
  Administered 2018-04-27 – 2018-04-28 (×2): 300 mg via ORAL
  Filled 2018-04-27 (×2): qty 1

## 2018-04-27 MED ORDER — LINACLOTIDE 145 MCG PO CAPS: 145.0000 ug | ORAL_CAPSULE | ORAL | Status: DC | PRN

## 2018-04-27 MED ORDER — FUROSEMIDE 10 MG/ML IJ SOLN
40.0000 mg | Freq: Two times a day (BID) | INTRAMUSCULAR | Status: DC
  Administered 2018-04-27 – 2018-04-28 (×3): 40 mg via INTRAVENOUS
  Filled 2018-04-27 (×3): qty 4

## 2018-04-27 MED ORDER — CARVEDILOL 3.125 MG PO TABS: 3.1250 mg | ORAL_TABLET | Freq: Two times a day (BID) | ORAL | Status: DC

## 2018-04-27 MED ORDER — INSULIN ASPART 100 UNIT/ML ~~LOC~~ SOLN
0.0000 [IU] | Freq: Every day | SUBCUTANEOUS | Status: DC
  Administered 2018-04-27: 2 [IU] via SUBCUTANEOUS

## 2018-04-27 MED ORDER — CLONIDINE HCL 0.2 MG PO TABS
0.2000 mg | ORAL_TABLET | Freq: Once | ORAL | Status: AC
  Administered 2018-04-27: 0.2 mg via ORAL
  Filled 2018-04-27: qty 1

## 2018-04-27 NOTE — ED Notes (Signed)
Pt here with multiple complaints including abd pain chest pain and sob , , pt was dx with gallstones but also ate some fried foods at 1 am , pt did not take any of her meds this morning

## 2018-04-27 NOTE — ED Notes (Signed)
Patient transported to X-ray 

## 2018-04-27 NOTE — ED Provider Notes (Signed)
Medical screening examination/treatment/procedure(s) were conducted as a shared visit with non-physician practitioner(s) and myself.  I personally evaluated the patient during the encounter.  EKG Interpretation  Date/Time:  Sunday April 27 2018 09:16:55 EST Ventricular Rate:  80 PR Interval:    QRS Duration: 85 QT Interval:  422 QTC Calculation: 487 R Axis:   53 Text Interpretation:  Sinus rhythm Short PR interval Borderline prolonged QT interval No significant change since last tracing Confirmed by Dorie Rank [id number]) on 04/27/2018 9:19:08 AM  Pt with known gallstones.  Complains of severe upper abdominal pain.  On exam ttp epigastric and ruq.  Will check labs, treat for biliary colic and assess for possible cholecystitis.    Dorie Rank, MD 04/27/18 [phone]

## 2018-04-27 NOTE — ED Triage Notes (Signed)
Pt here with c/o sob and wheezing , has asthma and known gallstones but did not take her pain meds

## 2018-04-27 NOTE — ED Provider Notes (Signed)
[facility] EMERGENCY DEPARTMENT Provider Note   CSN: [id number] Arrival date & time: 04/27/18  0907     History   Chief Complaint No chief complaint on file. Shortness of breath, Abdominal pain  HPI Zoe Clarke is a 68 y.o. female with a PMH of Diabetes, Asthma, HTN, CKD, and Gastric Ulcer presenting via EMS with constant shortness of breath and wheezing onset 3 days ago. Husband is a contributing historian. Patient states she has used her inhaler with partial relief. Patient states she had intermittent middle sharp non radiating chest pain onset last night. Patient denies current chest pain. Patient denies recent travel, leg edema/pain, or recent surgery. Patient states nothing makes symptoms better or worse. Patient denies using oxygen at home. Patient states she was admitted on 04/10/18 for nausea and abdominal pain. Patient was diagnosed with gallstones and followed up with surgery outpatient.  Surgery requested cardiology clearance before cholecystectomy.  Patient reports intermittent sharp upper abdominal pain onset today. Patient states last time she ate was shrimp fried rice and an egg role at 1am today.  Patient reports nausea and one episode of vomiting in the ER. Patient denies diarrhea and states last BM was yesterday and it was normal. Patient reports a dry cough for 2 weeks. Patient reports chronic back pain. Patient denies congestion or rhinorrhea. Patient reports dysuria and urinary frequency. Patient denies fever. Patient had a CT abdomen on 04/10/2018 and it revealed changes suggestive of right-sided focal pyelonephritis, cholelithiasis without complicating factors, and diverticulosis without diverticulitis.  HPI  Past Medical History:  Diagnosis Date  . Allergy   . Arthritis   . Asthma   . Diabetes mellitus without complication ([place])   . Gastric ulcer   . GERD (gastroesophageal reflux disease)   . Hypertension   . Osteoporosis     Patient  Active Problem List   Diagnosis Date Noted  . Acalculous cholecystitis 04/27/2018  . Acute systolic congestive heart failure ([place]) 04/27/2018  . Acute pyelonephritis 04/10/2018  . Hypertensive urgency 04/10/2018  . Leukocytosis 09/10/2017  . Anemia 09/10/2017  . Knee pain, bilateral 01/13/2015  . Bilateral hip pain 11/23/2014  . Diabetes mellitus type 2, uncontrolled, with complications ([place]) 40/98/1191  . Intertrigo 10/25/2014  . GERD (gastroesophageal reflux disease) 10/25/2014  . Diabetic neuropathy ([place]) 10/25/2014  . Chronic low back pain 10/25/2014  . Current tobacco use 11/11/2013  . Stage 3 chronic kidney disease due to type 2 diabetes mellitus ([place]) 09/02/2013  . Essential (primary) hypertension 09/02/2013  . Gout 12/05/2012    Past Surgical History:  Procedure Laterality Date  . TUBAL LIGATION       OB History   No obstetric history on file.      Home Medications    Prior to Admission medications   Medication Sig Start Date End Date Taking? Authorizing Provider  albuterol (PROVENTIL HFA;VENTOLIN HFA) 108 (90 Base) MCG/ACT inhaler Inhale 2 puffs into the lungs every 6 (six) hours as needed for wheezing or shortness of breath. 04/14/18   Bonnell Public, MD  atorvastatin (LIPITOR) 40 MG tablet Take 40 mg by mouth daily.    [provider]  carvedilol (COREG) 3.125 MG tablet Take 1 tablet (3.125 mg total) by mouth 2 (two) times daily with a meal. 04/14/18   Bonnell Public, MD  cloNIDine (CATAPRES) 0.2 MG tablet Take 1 tablet (0.2 mg total) by mouth 3 (three) times daily. 04/14/18   Bonnell Public, MD  fexofenadine (ALLEGRA) 180 MG  tablet Take 180 mg by mouth daily.    [provider]  gabapentin (NEURONTIN) 300 MG capsule Take 1 capsule (300 mg total) by mouth 3 (three) times daily. 04/14/18   Bonnell Public, MD  glipiZIDE (GLUCOTROL) 10 MG tablet Take 1 tablet (10 mg total) by mouth 2 (two) times daily before a meal. 08/27/15   Weber,  Damaris Hippo, PA-C  linaclotide (LINZESS) 145 MCG CAPS capsule Take 145 mcg by mouth as needed (constipation).    [provider]  oxyCODONE-acetaminophen (PERCOCET/ROXICET) 5-325 MG tablet Take 1-2 tablets by mouth every 6 (six) hours as needed for moderate pain. 04/14/18   Dana Allan I, MD  polyethylene glycol (MIRALAX / [name] packet Take 17 g by mouth daily as needed for mild constipation. 04/14/18   Bonnell Public, MD    Family History Family History  Problem Relation Age of Onset  . Arthritis Mother   . Heart disease Mother   . Hypertension Mother   . Arthritis Maternal Grandmother   . Hypertension Maternal Grandmother   . Stroke Maternal Grandfather   . Cancer Sister        unknown type cancer  . Colon cancer Neg Hx   . Esophageal cancer Neg Hx   . Stomach cancer Neg Hx   . Rectal cancer Neg Hx     Social History Social History   Tobacco Use  . Smoking status: Current Every Day Smoker    Packs/day: 0.50    Years: 39.00    Pack years: 19.50    Types: Cigarettes  . Smokeless tobacco: Never Used  . Tobacco comment: she used to smoke 2 packs a day   Substance Use Topics  . Alcohol use: No    Alcohol/week: 0.0 standard drinks  . Drug use: Yes    Types: Marijuana     Allergies   Aspirin; Allopurinol; Amlodipine; and Lisinopril   Review of Systems Review of Systems  Constitutional: Negative for activity change, appetite change, chills, fever and unexpected weight change.  HENT: Negative for congestion, rhinorrhea and sore throat.   Eyes: Negative for visual disturbance.  Respiratory: Positive for cough and shortness of breath.   Cardiovascular: Negative for chest pain.  Gastrointestinal: Positive for abdominal pain, nausea and vomiting. Negative for constipation and diarrhea.  Endocrine: Negative for polydipsia, polyphagia and polyuria.  Genitourinary: Positive for dysuria and frequency. Negative for flank pain.  Musculoskeletal: Positive for  back pain.  Skin: Negative for rash.  Neurological: Negative for weakness and headaches.  Psychiatric/Behavioral: The patient is not nervous/anxious.     Physical Exam Updated Vital Signs BP (!) 166/73   Pulse 100   Temp 98.6 F (37 C) (Oral)   Resp (!) 22   SpO2 93%   Physical Exam Vitals signs and nursing note reviewed.  Constitutional:      General: She is not in acute distress.    Appearance: She is well-developed. She is not diaphoretic.  HENT:     Head: Normocephalic and atraumatic.     Right Ear: Tympanic membrane, ear canal and external ear normal.     Left Ear: Tympanic membrane, ear canal and external ear normal.     Nose: Nose normal. No congestion or rhinorrhea.     Mouth/Throat:     Mouth: Mucous membranes are moist.     Pharynx: No oropharyngeal exudate or posterior oropharyngeal erythema.  Eyes:     Extraocular Movements: Extraocular movements intact.     Conjunctiva/sclera: Conjunctivae normal.  Pupils: Pupils are equal, round, and reactive to light.  Neck:     Musculoskeletal: Normal range of motion and neck supple.  Cardiovascular:     Rate and Rhythm: Normal rate and regular rhythm.     Heart sounds: Normal heart sounds. No murmur. No friction rub. No gallop.   Pulmonary:     Effort: Pulmonary effort is normal. No respiratory distress.     Breath sounds: Examination of the right-upper field reveals wheezing. Examination of the left-upper field reveals wheezing. Examination of the right-middle field reveals wheezing. Examination of the left-middle field reveals wheezing. Examination of the right-lower field reveals wheezing. Examination of the left-lower field reveals wheezing. Wheezing present. No rales.  Chest:     Chest wall: Tenderness present.  Abdominal:     General: Bowel sounds are normal. There is no distension.     Palpations: Abdomen is soft. Abdomen is not rigid. There is no mass.     Tenderness: There is abdominal tenderness in the right  upper quadrant, epigastric area and left upper quadrant. There is no right CVA tenderness, left CVA tenderness, guarding or rebound. Negative signs include Murphy's sign.     Hernia: No hernia is present.  Musculoskeletal: Normal range of motion.        General: No tenderness.     Right lower leg: Edema present.     Left lower leg: Edema present.  Skin:    General: Skin is warm.     Findings: No rash.  Neurological:     Mental Status: She is alert and oriented to person, place, and time.    ED Treatments / Results  Labs (all labs ordered are listed, but only abnormal results are displayed) Labs Reviewed  COMPREHENSIVE METABOLIC PANEL - Abnormal; Notable for the following components:      Result Value   Glucose, Bld 194 (*)    Creatinine, Ser 1.66 (*)    Calcium 8.6 (*)    Albumin 2.9 (*)    GFR calc non Af Amer 32 (*)    GFR calc Af Amer 37 (*)    All other components within normal limits  CBC WITH DIFFERENTIAL/PLATELET - Abnormal; Notable for the following components:   WBC 11.7 (*)    RBC 3.40 (*)    Hemoglobin 9.0 (*)    HCT 30.0 (*)    Neutro Abs 7.9 (*)    All other components within normal limits  BRAIN NATRIURETIC PEPTIDE - Abnormal; Notable for the following components:   B Natriuretic Peptide 630.3 (*)    All other components within normal limits  CBG MONITORING, ED - Abnormal; Notable for the following components:   Glucose-Capillary 179 (*)    All other components within normal limits  URINE CULTURE  LIPASE, BLOOD  URINALYSIS, ROUTINE W REFLEX MICROSCOPIC  I-STAT TROPONIN, ED    EKG EKG Interpretation  Date/Time:  Sunday April 27 2018 09:16:55 EST Ventricular Rate:  80 PR Interval:    QRS Duration: 85 QT Interval:  422 QTC Calculation: 487 R Axis:   53 Text Interpretation:  Sinus rhythm Short PR interval Borderline prolonged QT interval No significant change since last tracing Confirmed by Dorie Rank [phone]) on 04/27/2018 9:19:08  AM   Radiology Dg Chest 2 View  Result Date: 04/27/2018 CLINICAL DATA:  Pt here with multiple complaints including abd pain chest pain and sob. Patient was dx with gallstones but also ate some fried foods at 1 am , pt did not take any  of her meds this morning EXAM: CHEST - 2 VIEW COMPARISON:  09/01/2017 FINDINGS: The heart is enlarged. There R interstitial changes of pulmonary edema. More focal opacity at the bases is consistent alveolar edema. There are bilateral pleural effusions. IMPRESSION: Findings consistent with congestive heart failure. Electronically Signed   By: Nolon Nations M.D.   On: 04/27/2018 10:12   US Abdomen Limited Ruq  Result Date: 04/27/2018 CLINICAL DATA:  68 year old female with history of right upper quadrant and epigastric abdominal pain for the past 2 days. History of gallstones. EXAM: ULTRASOUND ABDOMEN LIMITED RIGHT UPPER QUADRANT COMPARISON:  Abdominal ultrasound 04/10/2018. FINDINGS: Gallbladder: Amorphous faintly echogenic material lying dependently in the gallbladder without posterior acoustic shadowing, compatible with biliary sludge. No definite gallstones. Gallbladder is normally distended. Gallbladder wall thickness is normal at 1.8 mm. No pericholecystic fluid. Per report from the sonographer, there was no sonographic Murphy's sign on examination. Common bile duct: Diameter: 6 mm in the porta hepatis. Liver: No focal lesion identified. Within normal limits in parenchymal echogenicity. Portal vein is patent on color Doppler imaging with normal direction of blood flow towards the liver. IMPRESSION: 1. Small amount of biliary sludge in the gallbladder, without evidence to suggest an acute cholecystitis at this time. Electronically Signed   By: Vinnie Langton M.D.   On: 04/27/2018 10:45    Procedures Procedures (including critical care time)  Medications Ordered in ED Medications  carvedilol (COREG) tablet 3.125 mg (has no administration in time range)   ondansetron (ZOFRAN) injection 4 mg (4 mg Intravenous Given 04/27/18 0951)  morphine 4 MG/ML injection 4 mg (4 mg Intravenous Given 04/27/18 0952)  albuterol (PROVENTIL) (2.5 MG/3ML) 0.083% nebulizer solution 2.5 mg (2.5 mg Nebulization Given 04/27/18 1042)  cloNIDine (CATAPRES) tablet 0.2 mg (0.2 mg Oral Given 04/27/18 1150)  furosemide (LASIX) injection 20 mg (20 mg Intravenous Given 04/27/18 1234)     Initial Impression / Assessment and Plan / ED Course  I have reviewed the triage vital signs and the nursing notes.  Pertinent labs & imaging results that were available during my care of the patient were reviewed by me and considered in my medical decision making (see chart for details).  Clinical Course as of Apr 28 1239  Sun Apr 27, 2018  1008 Leukocytosis noted at 11.7, similar to previous labs.  WBC(!): 11.7 [AH]  1022 Cardiomegaly, R interstitial changes of pulmonary edema, and a more focal opacity at the bases consistent alveolar edema. There are bilateral pleural effusions.    DG Chest 2 View [AH]  0998 Lymphocyte #: 2.6 [AH]  1100 Small amount of biliary sludge in the gallbladder, without evidence to suggest an acute cholecystitis at this time.    US Abdomen Limited RUQ [AH]  3382 Patient reports abdominal pain has resolved. Patient is non tender on exam.   [AH]  1156 BNP elevated at 630.  B Natriuretic Peptide(!): 630.3 [AH]    Clinical Course User Index [AH] Arville Lime, PA-C   Patient presents with upper abdominal pain and shortness of breath. Suspect abdominal pain is likely due to biliary sludge. Labs, imaging and vitals reviewed. On repeat exam patient does not have a surgical abdomin and there are no peritoneal signs.  No indication of appendicitis, bowel obstruction, bowel perforation, cholecystitis, or diverticulitis. Abdominal pain has resolved while in the ER.    Suspect shortness of breath is likely due to an exacerbation of CHF. Provided nebulizer treatment in  the ER with partial improvement. Patient has  required 2L of O2 while in the ER. Patient does not use oxygen at home. Patient's oxygen saturation dropped to 82 when oxygen was removed. Patient has not been evaluated by cardiology in the past for CHF. BNP is elevated at 630. CXR reveals pulmonary edema and pleural effusions consistent with heart failure. Provided Lasix. Patient also reported chest pain yesterday. No acute abnormalities found on EKG and first round of cardiac enzymes negative. Heart score is 4. Second troponin is pending. Patient will need admission for CHF exacerbation, hypoxia, and serial troponins/EKGs. This case was discussed with Dr. Tomi Bamberger who has seen the patient and agrees with plan to admit. Consulted hospitalist and hospitalist has agreed to admit patient.   Final Clinical Impressions(s) / ED Diagnoses   Final diagnoses:  Abdominal pain, epigastric  RUQ pain  Shortness of breath    ED Discharge Orders    None       Arville Lime, PA-C 04/27/18 1254    Dorie Rank, MD 04/29/18 1203

## 2018-04-27 NOTE — H&P (Signed)
History and Physical    Zoe Clarke BJY:[id number] DOB: Jul 18, 1950 DOA: 04/27/2018  PCP: Antony Contras, MD  Patient coming from: Home  I have personally briefly reviewed patient's old medical records in [place]  Chief Complaint: Shortness of breath and abdominal pain  HPI: Zoe Clarke is a 68 y.o. female with medical history significant of diabetes, asthma, hypertension, chronic kidney disease, gastric ulcer, and super morbid obesity who presents to the emergency department complaining of shortness of breath and wheezing for 3 days.  Used her inhaler with partial relief.  She has had intermittent middle sharp nonradiating chest pain which began last night.  She has no chest pain at the present time.  She has not had any recent travel, or recent surgery but does complain of lower extremity bilateral leg edema.  She states nothing makes her symptoms better or worse.  She denies using oxygen at home.  She was admitted on 04/10/2018 for nausea and abdominal pain was diagnosed with a calculus cholecystitis and follow-up with surgery as an outpatient was recommended.  Surgery has requested a cardiology clearance before cholecystectomy.  Had intermittent sharp upper abdominal pain which began today she is she states the last time she ate was 1 AM this morning she had shrimp fried rice and an egg roll.  She has had nausea and one episode of vomiting in the emergency department.  She has had no diarrhea her last bowel movement was yesterday and was normal.  She has had a dry cough for 2 weeks.  She is had CT of the abdomen on 04/10/2018 that revealed changes suggestive of right-sided focal pyelonephritis, cholelithiasis without complicating factors and diverticulosis without diverticulitis. ED Course: Chest x-ray consistent with acute pulmonary edema did not improve significantly with a nebulizer treatment.  O2 sats dropped to 82% when oxygen removed.  Not been seen by cardiology yet for any  evaluation.  Her BNP is elevated at 630.  EKG is reassuring and cardiac enzymes is negative.  Heart score is 4.  Patient will be admitted to the hospital for acute systolic congestive heart failure of new onset with associated hypoxemia.  Review of Systems: As per HPI otherwise all other systems reviewed and  negative.    Past Medical History:  Diagnosis Date  . Allergy   . Arthritis   . Asthma   . Diabetes mellitus without complication ([place])   . Gastric ulcer   . GERD (gastroesophageal reflux disease)   . Hypertension   . Osteoporosis     Past Surgical History:  Procedure Laterality Date  . TUBAL LIGATION      Social History   Social History Narrative   Fun: Race video car games - PS2/3, paint by number, board and card games, puzzle books   Feels safe at home and denies abuse   Disabled - does not work from chronic pain   Children - 1 living children, 3 step children   Grandchildren - 4   Married - lives with husband   Uses [employer] transportation     reports that she has been smoking cigarettes. She has a 19.50 pack-year smoking history. She has never used smokeless tobacco. She reports current drug use. Drug: Marijuana. She reports that she does not drink alcohol.  Allergies  Allergen Reactions  . Aspirin Other (See Comments)    Feels funny   . Allopurinol     Dizzy; hard to ambulate  Couldn't see; sweating  . Amlodipine Other (See Comments)  headache  . Lisinopril Swelling    Face swelling    Family History  Problem Relation Age of Onset  . Arthritis Mother   . Heart disease Mother   . Hypertension Mother   . Arthritis Maternal Grandmother   . Hypertension Maternal Grandmother   . Stroke Maternal Grandfather   . Cancer Sister        unknown type cancer  . Colon cancer Neg Hx   . Esophageal cancer Neg Hx   . Stomach cancer Neg Hx   . Rectal cancer Neg Hx      Prior to Admission medications   Medication Sig Start Date End Date Taking? Authorizing  Provider  albuterol (PROVENTIL HFA;VENTOLIN HFA) 108 (90 Base) MCG/ACT inhaler Inhale 2 puffs into the lungs every 6 (six) hours as needed for wheezing or shortness of breath. 04/14/18   Bonnell Public, MD  atorvastatin (LIPITOR) 40 MG tablet Take 40 mg by mouth daily.    [provider]  carvedilol (COREG) 3.125 MG tablet Take 1 tablet (3.125 mg total) by mouth 2 (two) times daily with a meal. 04/14/18   Bonnell Public, MD  cloNIDine (CATAPRES) 0.2 MG tablet Take 1 tablet (0.2 mg total) by mouth 3 (three) times daily. 04/14/18   Bonnell Public, MD  fexofenadine (ALLEGRA) 180 MG tablet Take 180 mg by mouth daily.    [provider]  gabapentin (NEURONTIN) 300 MG capsule Take 1 capsule (300 mg total) by mouth 3 (three) times daily. 04/14/18   Bonnell Public, MD  glipiZIDE (GLUCOTROL) 10 MG tablet Take 1 tablet (10 mg total) by mouth 2 (two) times daily before a meal. 08/27/15   Weber, Damaris Hippo, PA-C  linaclotide (LINZESS) 145 MCG CAPS capsule Take 145 mcg by mouth as needed (constipation).    [provider]  oxyCODONE-acetaminophen (PERCOCET/ROXICET) 5-325 MG tablet Take 1-2 tablets by mouth every 6 (six) hours as needed for moderate pain. 04/14/18   Dana Allan I, MD  polyethylene glycol (MIRALAX / [name] packet Take 17 g by mouth daily as needed for mild constipation. 04/14/18   Bonnell Public, MD    Physical Exam:  Constitutional: Uncomfortable, 4 word phrases dyspnea, using accessory muscles, super morbid obesity BMI of greater than 40 Vitals:   04/27/18 1115 04/27/18 1130 04/27/18 1145 04/27/18 1215  BP: (!) 172/68 (!) 167/72 (!) 170/68 (!) 166/73  Pulse: 68 73 78 100  Resp: 12 17 17  (!) 22  Temp:      TempSrc:      SpO2: 91% 95% 94% 93%   Eyes: PERRL, lids and conjunctivae normal ENMT: Mucous membranes are moist. Posterior pharynx clear of any exudate or lesions.Normal dentition.  Neck: normal, supple, no masses, no  thyromegaly Respiratory: Heart sounds with cardiac wheezing, crackles halfway up the lung fields bilaterally. Normal respiratory effort. No accessory muscle use.  Cardiovascular: Regular rate and rhythm, no murmurs / rubs / gallops.  2+ pretibial extremity edema. 2+ pedal pulses. No carotid bruits.  Abdomen: no tenderness, no masses palpated. No hepatosplenomegaly. Bowel sounds positive.  Musculoskeletal: no clubbing / cyanosis. No joint deformity upper and lower extremities. Good ROM, no contractures. Normal muscle tone.  Skin: no rashes, lesions, ulcers. No induration Neurologic: CN 2-12 grossly intact. Sensation intact, DTR normal. Strength 5/5 in all 4.  Psychiatric: Normal judgment and insight. Alert and oriented x 3. Normal mood.   Labs on Admission: I have personally reviewed following labs and imaging studies  CBC: Recent  Labs  Lab 04/27/18 0940  WBC 11.7*  NEUTROABS 7.9*  HGB 9.0*  HCT 30.0*  MCV 88.2  PLT 456   Basic Metabolic Panel: Recent Labs  Lab 04/27/18 0940  NA 140  K 4.3  CL 105  CO2 26  GLUCOSE 194*  BUN 14  CREATININE 1.66*  CALCIUM 8.6*   GFR: CrCl cannot be calculated (Unknown ideal weight.). Liver Function Tests: Recent Labs  Lab 04/27/18 0940  AST 15  ALT 19  ALKPHOS 87  BILITOT 0.6  PROT 6.6  ALBUMIN 2.9*   Recent Labs  Lab 04/27/18 0940  LIPASE 22   CBG: Recent Labs  Lab 04/27/18 0939  GLUCAP 179*   Urine analysis:    Component Value Date/Time   COLORURINE YELLOW 04/12/2018 1830   APPEARANCEUR HAZY (A) 04/12/2018 1830   LABSPEC 1.017 04/12/2018 1830   PHURINE 5.0 04/12/2018 1830   GLUCOSEU 50 (A) 04/12/2018 1830   HGBUR SMALL (A) 04/12/2018 1830   BILIRUBINUR NEGATIVE 04/12/2018 1830   BILIRUBINUR negative 02/25/2015 1426   KETONESUR NEGATIVE 04/12/2018 1830   PROTEINUR 100 (A) 04/12/2018 1830   UROBILINOGEN 0.2 02/25/2015 1426   UROBILINOGEN 1.0 04/16/2009 0943   NITRITE NEGATIVE 04/12/2018 1830   LEUKOCYTESUR  NEGATIVE 04/12/2018 1830    Radiological Exams on Admission: Dg Chest 2 View  Result Date: 04/27/2018 CLINICAL DATA:  Pt here with multiple complaints including abd pain chest pain and sob. Patient was dx with gallstones but also ate some fried foods at 1 am , pt did not take any of her meds this morning EXAM: CHEST - 2 VIEW COMPARISON:  09/01/2017 FINDINGS: The heart is enlarged. There R interstitial changes of pulmonary edema. More focal opacity at the bases is consistent alveolar edema. There are bilateral pleural effusions. IMPRESSION: Findings consistent with congestive heart failure. Electronically Signed   By: Nolon Nations M.D.   On: 04/27/2018 10:12   US Abdomen Limited Ruq  Result Date: 04/27/2018 CLINICAL DATA:  68 year old female with history of right upper quadrant and epigastric abdominal pain for the past 2 days. History of gallstones. EXAM: ULTRASOUND ABDOMEN LIMITED RIGHT UPPER QUADRANT COMPARISON:  Abdominal ultrasound 04/10/2018. FINDINGS: Gallbladder: Amorphous faintly echogenic material lying dependently in the gallbladder without posterior acoustic shadowing, compatible with biliary sludge. No definite gallstones. Gallbladder is normally distended. Gallbladder wall thickness is normal at 1.8 mm. No pericholecystic fluid. Per report from the sonographer, there was no sonographic Murphy's sign on examination. Common bile duct: Diameter: 6 mm in the porta hepatis. Liver: No focal lesion identified. Within normal limits in parenchymal echogenicity. Portal vein is patent on color Doppler imaging with normal direction of blood flow towards the liver. IMPRESSION: 1. Small amount of biliary sludge in the gallbladder, without evidence to suggest an acute cholecystitis at this time. Electronically Signed   By: Vinnie Langton M.D.   On: 04/27/2018 10:45    EKG: Independently reviewed.  Normal sinus rhythm with short PR interval and increased QT.  No change when compared to  04/11/2018.  Assessment/Plan Principal Problem:   Acute systolic congestive heart failure (HCC) Active Problems:   Diabetes mellitus type 2, uncontrolled, with complications (HCC)   Stage 3 chronic kidney disease due to type 2 diabetes mellitus (HCC)   Acalculous cholecystitis   GERD (gastroesophageal reflux disease)   Diabetic neuropathy ([place])   Essential (primary) hypertension   Anemia   Tobacco abuse counseling   Acute systolic CHF (congestive heart failure) ([place])   1.  Acute  systolic congestive heart failure: This is a new and first episode for this patient.  We will place the patient in the hospital order echocardiogram start her on Lasix twice daily.  She has an allergy to lisinopril and cannot take it.  We will add hydralazine and nitrates.  Will restart home beta-blocker.  Will likely need cardiology evaluation either inpatient or outpatient.  2.  Diabetes mellitus type 2 uncontrolled with complications: Check hemoglobin A1c.  Continue home medications.  Long discussion with the patient regarding diet and how diabetes can contribute to congestive heart failure.  Hospital by sliding scale insulin protocol.  3.  Stage III chronic kidney disease due to type 2 diabetes mellitus: Avoid nephrotoxic agents.  Recheck BMP in a.m.  4.  Acalculous cholecystitis: Patient seems surprised that her diagnosis did not seem to know that she was supposed to see a surgeon and a cardiologist.  I suspect that she was referred back to her primary care doctor to organize these.  Would recommend assistance from case management prior to discharge in terms of ensuring that she has proper follow-up.  5.  Gastroesophageal reflux disease: Continue proton pump inhibitor.  6.  Diabetic neuropathy continue gabapentin.  7.  Essential hypertension continue home blood pressure medication regimen however will discontinue clonidine as we are adding hydralazine and nitrates.  8.  Anemia: Noted likely related to  chronic renal disease.  9.  Tobacco abuse counseling: I discussed with the patient the importance of smoking cessation and how smoking relates to heart failure, complicates problems associated with diabetes and leads to cancer.  She tell me she last smoked 2 days ago and is interested in quitting.  I spent less than 10 minutes in tobacco cessation counseling    DVT prophylaxis: Q. heparin Code Status: Full code Family Communication: Spoke with with patient, no family present, patient retains capacity. Disposition Plan: Likely home in 3 to 4 days Consults called: None Admission status: Inpatient   Lady Deutscher MD FACP [facility] Pager [phone]  How to contact the [facility] Attending or Consulting provider [place] or covering provider during after hours [place], for this patient?  1. Check the care team in [facility] and look for a) attending/consulting [facility] provider listed and b) the [facility] team listed 2. Log into [email or web address] and use [place]'s universal password to access. If you do not have the password, please contact the hospital operator. 3. Locate the [facility] provider you are looking for under [facility] and page to a number that you can be directly reached. 4. If you still have difficulty reaching the provider, please page the [facility] (Director on Call) for the Hospitalists listed on amion for assistance.  If 7PM-7AM, please contact night-coverage [email or web address] Password [email or web address]  04/27/2018, 12:52 PM

## 2018-04-28 ENCOUNTER — Inpatient Hospital Stay (HOSPITAL_COMMUNITY): Payer: PPO

## 2018-04-28 ENCOUNTER — Other Ambulatory Visit: Payer: Self-pay

## 2018-04-28 DIAGNOSIS — I351 Nonrheumatic aortic (valve) insufficiency: Secondary | ICD-10-CM

## 2018-04-28 LAB — COMPREHENSIVE METABOLIC PANEL
ALBUMIN: 2.5 g/dL — AB (ref 3.5–5.0)
ALT: 16 U/L (ref 0–44)
AST: 10 U/L — AB (ref 15–41)
Alkaline Phosphatase: 77 U/L (ref 38–126)
Anion gap: 5 (ref 5–15)
BUN: 16 mg/dL (ref 8–23)
CO2: 28 mmol/L (ref 22–32)
Calcium: 8.1 mg/dL — ABNORMAL LOW (ref 8.9–10.3)
Chloride: 107 mmol/L (ref 98–111)
Creatinine, Ser: 1.86 mg/dL — ABNORMAL HIGH (ref 0.44–1.00)
GFR calc Af Amer: 32 mL/min — ABNORMAL LOW (ref >60)
GFR calc non Af Amer: 28 mL/min — ABNORMAL LOW (ref >60)
Glucose, Bld: 163 mg/dL — ABNORMAL HIGH (ref 70–99)
Potassium: 4.8 mmol/L (ref 3.5–5.1)
SODIUM: 140 mmol/L (ref 135–145)
Total Bilirubin: 0.7 mg/dL (ref 0.3–1.2)
Total Protein: 5.8 g/dL — ABNORMAL LOW (ref 6.5–8.1)

## 2018-04-28 LAB — ECHOCARDIOGRAM COMPLETE
Height: 68 in
Weight: 4054.4 oz

## 2018-04-28 LAB — GLUCOSE, CAPILLARY
Glucose-Capillary: 117 mg/dL — ABNORMAL HIGH (ref 70–99)
Glucose-Capillary: 121 mg/dL — ABNORMAL HIGH (ref 70–99)
Glucose-Capillary: 91 mg/dL (ref 70–99)
Glucose-Capillary: 88 mg/dL (ref 70–99)

## 2018-04-28 LAB — URINE CULTURE: Culture: <10000 — AB

## 2018-04-28 LAB — CBC WITH DIFFERENTIAL/PLATELET
ABS IMMATURE GRANULOCYTES: 0.03 10*3/uL (ref 0.00–0.07)
Basophils Absolute: 0 10*3/uL (ref 0.0–0.1)
Basophils Relative: 0 %
Eosinophils Absolute: 0.2 10*3/uL (ref 0.0–0.5)
Eosinophils Relative: 3 %
HCT: 27.2 % — ABNORMAL LOW (ref 36.0–46.0)
Hemoglobin: 8.1 g/dL — ABNORMAL LOW (ref 12.0–15.0)
Immature Granulocytes: 0 %
Lymphocytes Relative: 22 %
Lymphs Abs: 2 10*3/uL (ref 0.7–4.0)
MCH: 26.6 pg (ref 26.0–34.0)
MCHC: 29.8 g/dL — ABNORMAL LOW (ref 30.0–36.0)
MCV: 89.2 fL (ref 80.0–100.0)
MONO ABS: 0.6 10*3/uL (ref 0.1–1.0)
Monocytes Relative: 6 %
NEUTROS ABS: 6.3 10*3/uL (ref 1.7–7.7)
Neutrophils Relative %: 69 %
PLATELETS: 206 10*3/uL (ref 150–400)
RBC: 3.05 MIL/uL — AB (ref 3.87–5.11)
RDW: 14.5 % (ref 11.5–15.5)
WBC: 9.2 10*3/uL (ref 4.0–10.5)
nRBC: 0 % (ref 0.0–0.2)

## 2018-04-28 LAB — TROPONIN I: Troponin I: <0.03 ng/mL (ref <0.03)

## 2018-04-28 LAB — BRAIN NATRIURETIC PEPTIDE: B NATRIURETIC PEPTIDE 5: 451.4 pg/mL — AB (ref 0.0–100.0)

## 2018-04-28 MED ORDER — HYDRALAZINE HCL 20 MG/ML IJ SOLN
10.0000 mg | Freq: Four times a day (QID) | INTRAMUSCULAR | Status: DC | PRN
  Administered 2018-04-28 – 2018-04-29 (×2): 10 mg via INTRAVENOUS
  Filled 2018-04-28 (×2): qty 1

## 2018-04-28 MED ORDER — GUAIFENESIN-DM 100-10 MG/5ML PO SYRP
5.0000 mL | ORAL_SOLUTION | ORAL | Status: DC | PRN
  Administered 2018-04-28 – 2018-04-30 (×4): 5 mL via ORAL
  Filled 2018-04-28 (×4): qty 5

## 2018-04-28 MED ORDER — GABAPENTIN 100 MG PO CAPS
100.0000 mg | ORAL_CAPSULE | Freq: Three times a day (TID) | ORAL | Status: DC
  Administered 2018-04-28 (×2): 100 mg via ORAL
  Filled 2018-04-28 (×2): qty 1

## 2018-04-28 MED ORDER — INSULIN ASPART 100 UNIT/ML ~~LOC~~ SOLN: 0.0000 [IU] | Freq: Every day | SUBCUTANEOUS | Status: DC

## 2018-04-28 MED ORDER — INSULIN ASPART 100 UNIT/ML ~~LOC~~ SOLN
0.0000 [IU] | Freq: Three times a day (TID) | SUBCUTANEOUS | Status: DC
  Administered 2018-04-29 – 2018-05-01 (×4): 2 [IU] via SUBCUTANEOUS

## 2018-04-28 NOTE — Progress Notes (Signed)
PROGRESS NOTE    Zoe Clarke  YJE:[id number] DOB: 1950/12/17 DOA: 04/27/2018 PCP: Antony Contras, MD  Brief Narrative: 68 year old female with history of diabetes mellitus, asthma, hypertension, chronic kidney disease, peptic ulcer disease, morbid obesity was recently hospitalized with abdominal pain diagnosed with cholelithiasis and biliary dyskinesia, advised outpatient follow-up with general surgery was discharged home on 1/27. -With progressive shortness of breath, orthopnea and leg swelling for 3 days. -She reports having intermittent leg swelling for over a year, used to be on HCTZ which she discontinued due to a questionable allergy. -In the emergency room she was mildly hypoxemic, chest x-ray was consistent with acute pulmonary edema, BNP was 630  Assessment & Plan:   Principal Problem:   Acute congestive heart failure (HCC) -Likely diastolic -Continue IV Lasix today 40 mg twice daily -Follow-up echocardiogram done this morning -Continue low-dose carvedilol per home regimen -She was also started on Imdur and hydralazine on admission -Monitor I/O, daily weights  Cholelithiasis, biliary sludge -No evidence of acute cholecystitis based on symptoms, Murphy sign, right upper quadrant ultrasound at this time -Was just seen by general surgery last week inpatient, recommended outpatient follow-up  Stage III chronic kidney disease -Baseline creatinine around 1.8 -Stable, monitor with diuresis   Diabetes mellitus type 2, uncontrolled, with complications ([place])  COPD/tobacco abuse -Counseled, stable, no wheezing, nebs as needed  GERD -Continue PPI  Chronic anemia -Likely due to CKD worsened in the setting of volume overloaded state/hemodilution -Monitor with diuresis  DVT prophylaxis: Lovenox Code Status: Full code Family Communication: Spouse at bedside Disposition Plan: Home pending clinical improvement  Consultants:      Procedures:   Antimicrobials:     Subjective: -Feels a little better, breathing is improving, not back to baseline yet  Objective: Vitals:   04/27/18 1904 04/27/18 2111 04/28/18 0052 04/28/18 0542  BP: (!) 191/84 (!) 160/74 (!) 153/64 (!) 176/78  Pulse: 79 65 85 76  Resp: 18 16 18  (!) 22  Temp: 98.6 F (37 C) 98.8 F (37.1 C) 98.7 F (37.1 C) 98.7 F (37.1 C)  TempSrc: Oral Oral Oral Oral  SpO2: 96% 98% 94% 97%  Weight: 116.1 kg   114.9 kg  Height: 5\' 8"  (1.727 m)       Intake/Output Summary (Last 24 hours) at 04/28/2018 1113 Last data filed at 04/28/2018 0847 Gross per 24 hour  Intake 120 ml  Output 700 ml  Net -580 ml   Filed Weights   04/27/18 1904 04/28/18 0542  Weight: 116.1 kg 114.9 kg    Examination:  General exam: Obese chronically ill-appearing female, sitting up in bed, no distress   Respiratory system: Bibasilar crackles Cardiovascular system: S1 & S2 heard, RRR. Gastrointestinal system: Abdomen is nondistended, soft and nontender.Normal bowel sounds heard. Central nervous system: Alert and oriented. No focal neurological deficits. Extremities: Trace edema Skin: No rashes, lesions or ulcers Psychiatry: Judgement and insight appear normal. Mood & affect appropriate.     Data Reviewed:   CBC: Recent Labs  Lab 04/27/18 0940 04/27/18 1322 04/28/18 0223  WBC 11.7* 10.6* 9.2  NEUTROABS 7.9*  --  6.3  HGB 9.0* 8.3* 8.1*  HCT 30.0* 27.6* 27.2*  MCV 88.2 89.0 89.2  PLT 231 219 637   Basic Metabolic Panel: Recent Labs  Lab 04/27/18 0940 04/27/18 1322 04/28/18 0223  NA 140  --  140  K 4.3  --  4.8  CL 105  --  107  CO2 26  --  28  GLUCOSE 194*  --  163*  BUN 14  --  16  CREATININE 1.66* 1.64* 1.86*  CALCIUM 8.6*  --  8.1*  MG  --  2.0  --    GFR: Estimated Creatinine Clearance: 39.6 mL/min (A) (by C-G formula based on SCr of 1.86 mg/dL (H)). Liver Function Tests: Recent Labs  Lab 04/27/18 0940 04/28/18 0223  AST 15 10*  ALT 19 16  ALKPHOS 87 77  BILITOT 0.6 0.7   PROT 6.6 5.8*  ALBUMIN 2.9* 2.5*   Recent Labs  Lab 04/27/18 0940  LIPASE 22   No results for input(s): AMMONIA in the last 168 hours. Coagulation Profile: No results for input(s): INR, PROTIME in the last 168 hours. Cardiac Enzymes: Recent Labs  Lab 04/27/18 1322 04/27/18 1935 04/28/18 0223  TROPONINI <0.03 <0.03 <0.03   BNP (last 3 results) No results for input(s): PROBNP in the last 8760 hours. HbA1C: Recent Labs    04/27/18 1322  HGBA1C 8.2*   CBG: Recent Labs  Lab 04/27/18 0939 04/27/18 2110 04/28/18 0810  GLUCAP 179* 221* 117*   Lipid Profile: No results for input(s): CHOL, HDL, LDLCALC, TRIG, CHOLHDL, LDLDIRECT in the last 72 hours. Thyroid Function Tests: Recent Labs    04/27/18 1322  TSH 3.871   Anemia Panel: No results for input(s): VITAMINB12, FOLATE, FERRITIN, TIBC, IRON, RETICCTPCT in the last 72 hours. Urine analysis:    Component Value Date/Time   COLORURINE YELLOW 04/27/2018 1334   APPEARANCEUR CLEAR 04/27/2018 1334   LABSPEC 1.012 04/27/2018 1334   PHURINE 7.0 04/27/2018 1334   GLUCOSEU 50 (A) 04/27/2018 1334   HGBUR SMALL (A) 04/27/2018 1334   BILIRUBINUR NEGATIVE 04/27/2018 1334   BILIRUBINUR negative 02/25/2015 1426   KETONESUR NEGATIVE 04/27/2018 1334   PROTEINUR 100 (A) 04/27/2018 1334   UROBILINOGEN 0.2 02/25/2015 1426   UROBILINOGEN 1.0 04/16/2009 0943   NITRITE NEGATIVE 04/27/2018 1334   LEUKOCYTESUR NEGATIVE 04/27/2018 1334   Sepsis Labs: @LABRCNTIP (procalcitonin:4,lacticidven:4)  )No results found for this or any previous visit (from the past 240 hour(s)).       Radiology Studies: Dg Chest 2 View  Result Date: 04/28/2018 CLINICAL DATA:  Shortness of breath with hypertension and congestive heart failure. Chronic bronchitis. EXAM: CHEST - 2 VIEW COMPARISON:  04/27/2018 FINDINGS: Lungs are adequately inflated with focal opacification over the right base slightly more confluent. Likely small amount layering posterior  bilateral pleural fluid without significant change. Borderline stable cardiomegaly. Remainder of the exam is unchanged. IMPRESSION: Right base airspace opacification slightly more confluent and likely due to infection. Small amount of posterior layering pleural fluid bilaterally without significant change. Electronically Signed   By: Marin Olp M.D.   On: 04/28/2018 09:13   Dg Chest 2 View  Result Date: 04/27/2018 CLINICAL DATA:  Pt here with multiple complaints including abd pain chest pain and sob. Patient was dx with gallstones but also ate some fried foods at 1 am , pt did not take any of her meds this morning EXAM: CHEST - 2 VIEW COMPARISON:  09/01/2017 FINDINGS: The heart is enlarged. There R interstitial changes of pulmonary edema. More focal opacity at the bases is consistent alveolar edema. There are bilateral pleural effusions. IMPRESSION: Findings consistent with congestive heart failure. Electronically Signed   By: Nolon Nations M.D.   On: 04/27/2018 10:12   US Abdomen Limited Ruq  Result Date: 04/27/2018 CLINICAL DATA:  68 year old female with history of right upper quadrant and epigastric abdominal pain for the past 2 days. History of gallstones. EXAM:  ULTRASOUND ABDOMEN LIMITED RIGHT UPPER QUADRANT COMPARISON:  Abdominal ultrasound 04/10/2018. FINDINGS: Gallbladder: Amorphous faintly echogenic material lying dependently in the gallbladder without posterior acoustic shadowing, compatible with biliary sludge. No definite gallstones. Gallbladder is normally distended. Gallbladder wall thickness is normal at 1.8 mm. No pericholecystic fluid. Per report from the sonographer, there was no sonographic Murphy's sign on examination. Common bile duct: Diameter: 6 mm in the porta hepatis. Liver: No focal lesion identified. Within normal limits in parenchymal echogenicity. Portal vein is patent on color Doppler imaging with normal direction of blood flow towards the liver. IMPRESSION: 1. Small amount  of biliary sludge in the gallbladder, without evidence to suggest an acute cholecystitis at this time. Electronically Signed   By: Vinnie Langton M.D.   On: 04/27/2018 10:45        Scheduled Meds: . aspirin EC  81 mg Oral Daily  . atorvastatin  40 mg Oral Daily  . carvedilol  3.125 mg Oral BID WC  . cephALEXin  250 mg Oral QID  . cyclobenzaprine  5 mg Oral QHS  . enoxaparin (LOVENOX) injection  40 mg Subcutaneous Q24H  . furosemide  40 mg Intravenous BID  . gabapentin  300 mg Oral TID  . glipiZIDE  10 mg Oral BID AC  . insulin aspart  0-20 Units Subcutaneous TID WC  . insulin aspart  0-5 Units Subcutaneous QHS  . isosorbide-hydrALAZINE  1 tablet Oral TID  . loratadine  10 mg Oral Daily  . potassium chloride  10 mEq Oral BID  . sodium chloride flush  3 mL Intravenous Q12H   Continuous Infusions: . sodium chloride       LOS: 1 day    Time spent: 41min    Domenic Polite, MD [facility] Page via [email or web address], password [email or web address] After 7PM please contact night-coverage  04/28/2018, 11:13 AM

## 2018-04-28 NOTE — Evaluation (Signed)
Occupational Therapy Evaluation Patient Details Name: Zoe Clarke MRN: [id number] DOB: 07-13-1950 Today's Date: 04/28/2018    History of Present Illness Zoe Clarke is a 68 y.o. female with medical history significant of diabetes, asthma, hypertension, chronic kidney disease, gastric ulcer, and super morbid obesity who presents to the emergency department complaining of shortness of breath and wheezing    Clinical Impression   Pt with decline in function and safety with ADLs and ADL mobility with decreased strength, balance and endurance. Pt fatigues easily with O2 SATs dropping to 87% during toileting tasks. Pt would benefit from acute OT services to address impairments to maximize level of function and safety    Follow Up Recommendations  Supervision - Intermittent    Equipment Recommendations  Tub/shower bench;Other (comment)(reacher)    Recommendations for Other Services       Precautions / Restrictions Precautions Precautions: Fall Precaution Comments: moniter O2 Restrictions Weight Bearing Restrictions: No      Mobility Bed Mobility Overal bed mobility: Needs Assistance Bed Mobility: Supine to Sit;Sit to Supine     Supine to sit: Supervision Sit to supine: Min assist   General bed mobility comments: increased time and effort, min A with LEs back onto bed  Transfers Overall transfer level: Needs assistance Equipment used: Rolling walker (2 wheeled) Transfers: Sit to/from Stand Sit to Stand: Min assist;Min guard         General transfer comment: Needed cues for foot placement as pt keeps feet close together.  Needed steadying assist.  Pt needed controlled descent upon return.      Balance Overall balance assessment: Needs assistance Sitting-balance support: No upper extremity supported;Feet supported Sitting balance-Leahy Scale: Fair     Standing balance support: Bilateral upper extremity supported;During functional activity Standing  balance-Leahy Scale: Poor Standing balance comment: relies on UE support                           ADL either performed or assessed with clinical judgement   ADL Overall ADL's : Needs assistance/impaired     Grooming: Wash/dry hands;Wash/dry face;Minimal assistance;Standing   Upper Body Bathing: Set up;Supervision/ safety;Sitting   Lower Body Bathing: Maximal assistance;With caregiver independent assisting   Upper Body Dressing : Supervision/safety;Set up;Sitting   Lower Body Dressing: Maximal assistance;With caregiver independent assisting   Toilet Transfer: Minimal assistance;Min guard;Ambulation;RW;Stand-pivot;BSC   Toileting- Water quality scientist and Hygiene: Min guard;Sit to/from stand       Functional mobility during ADLs: Minimal assistance;Min guard;Rolling walker       Vision Baseline Vision/History: Wears glasses Wears Glasses: Reading only Patient Visual Report: No change from baseline       Perception     Praxis      Pertinent Vitals/Pain Pain Assessment: 0-10 Pain Score: 7  Pain Location: right abdomen Pain Descriptors / Indicators: Aching;Grimacing;Guarding Pain Intervention(s): Limited activity within patient's tolerance;Monitored during session;Premedicated before session;Repositioned     Hand Dominance Left   Extremity/Trunk Assessment Upper Extremity Assessment Upper Extremity Assessment: Generalized weakness   Lower Extremity Assessment Lower Extremity Assessment: Defer to PT evaluation   Cervical / Trunk Assessment Cervical / Trunk Assessment: Normal   Communication Communication Communication: No difficulties   Cognition Arousal/Alertness: Awake/alert Behavior During Therapy: WFL for tasks assessed/performed Overall Cognitive Status: Within Functional Limits for tasks assessed  General Comments  Husband present.  Husband asking for equipment that he says [facility] lost  prescription for.      Exercises     Shoulder Instructions      Home Living Family/patient expects to be discharged to:: Private residence Living Arrangements: Spouse/significant other Available Help at Discharge: Family;Available 24 hours/day Type of Home: House Home Access: Stairs to enter [employer] of Steps: 3 Entrance Stairs-Rails: Left Home Layout: One level     Bathroom Shower/Tub: Teacher, early years/pre: Standard     Home Equipment: Bedside commode;Walker - 2 wheels;Cane - single point;Walker - 4 wheels;Grab bars - tub/shower          Prior Functioning/Environment Level of Independence: Independent with assistive device(s)  Gait / Transfers Assistance Needed: Mod I with RW ADL's / Homemaking Assistance Needed: Bathes self and assist with dressing            OT Problem List: Decreased strength;Decreased activity tolerance;Pain;Obesity;Impaired balance (sitting and/or standing);Decreased knowledge of use of DME or AE      OT Treatment/Interventions: Self-care/ADL training;DME and/or AE instruction;Therapeutic activities;Patient/family education    OT Goals(Current goals can be found in the care plan section) Acute Rehab OT Goals Patient Stated Goal: go home OT Goal Formulation: With patient/family Time For Goal Achievement: 05/12/18 Potential to Achieve Goals: Good ADL Goals Pt Will Perform Grooming: with min guard assist;with supervision;with set-up;standing Pt Will Perform Lower Body Bathing: with mod assist;with caregiver independent in assisting Pt Will Perform Lower Body Dressing: with mod assist;with caregiver independent in assisting Pt Will Transfer to Toilet: with supervision;bedside commode Pt Will Perform Toileting - Clothing Manipulation and hygiene: with supervision;sit to/from stand  OT Frequency: Min 2X/week   Barriers to D/C:    no barriers       Co-evaluation              AM-PAC OT "6 Clicks" Daily  Activity     Outcome Measure Help from another person eating meals?: None Help from another person taking care of personal grooming?: None Help from another person toileting, which includes using toliet, bedpan, or urinal?: A Little Help from another person bathing (including washing, rinsing, drying)?: A Lot Help from another person to put on and taking off regular upper body clothing?: None Help from another person to put on and taking off regular lower body clothing?: A Lot 6 Click Score: 19   End of Session Equipment Utilized During Treatment: Gait belt;Rolling walker;Other (comment)(BSC)  Activity Tolerance: Patient limited by fatigue Patient left: in bed;with call bell/phone within reach;with family/visitor present  OT Visit Diagnosis: Unsteadiness on feet (R26.81);Other abnormalities of gait and mobility (R26.89);Muscle weakness (generalized) (M62.81);Pain Pain - Right/Left: Right Pain - part of body: (abdomen)                Time: 5056-9794 OT Time Calculation (min): 29 min Charges:  OT General Charges $OT Visit: 1 Visit OT Evaluation $OT Eval Moderate Complexity: 1 Mod OT Treatments $Self Care/Home Management : 8-22 mins    Britt Bottom 04/28/2018, 12:48 PM

## 2018-04-28 NOTE — Plan of Care (Signed)
  Problem: Food- and Nutrition-Related Knowledge Deficit (NB-1.1) Goal: Nutrition education Description Formal process to instruct or train a patient/client in a skill or to impart knowledge to help patients/clients voluntarily manage or modify food choices and eating behavior to maintain or improve health. Outcome: Completed/Met   Althea Grimmer, MS, RDN, LDN Pager: [phone] Available Mondays and Fridays, 9am-2pm

## 2018-04-28 NOTE — Evaluation (Addendum)
Physical Therapy Evaluation Patient Details Name: Zoe Clarke MRN: [id number] DOB: [birth year] Today's Date: 04/28/2018   History of Present Illness  Zoe Clarke is a 68 y.o. female with medical history significant of diabetes, asthma, hypertension, chronic kidney disease, gastric ulcer, and super morbid obesity who presents to the emergency department complaining of shortness of breath and wheezing   Clinical Impression  Pt admitted with above diagnosis. Pt currently with functional limitations due to the deficits listed below (see PT Problem List). Pt was able to ambulate with RW with slow cadence but was safe.  She ambulates short distances at baseline.  Pt did desat to 86% on rA with activity and needed 2L O2 with activity. Husband complaining to this PT as he says [facility] was supposed to order him an IT trainer wheelchair and scooter and that they lost his information.  Will try to see if CM can assist with this as pt will need a seating evaluation by [facility] so that the correct chair is ordered.  Pt husband states MD wanted pt to use electric wheelchair for community and use of SCAT.  Scooter would be for indoors.  Pt will need portable ramp as well and he says he will contact [place] for this. Will follow acutely.   Pt will benefit from skilled PT to increase their independence and safety with mobility to allow discharge to the venue listed below.    SATURATION QUALIFICATIONS: (This note is used to comply with regulatory documentation for home oxygen)  Patient Saturations on Room Air at Rest = 90%  Patient Saturations on Room Air while Ambulating = 86%  Patient Saturations on 2 Liters of oxygen while Ambulating = 94%  Please briefly explain why patient needs home oxygen:Pt desats on RA with activity.  Pt Needed O2 to keep sats >90%.  Follow Up Recommendations Home health PT;Supervision/Assistance - 24 hour    Equipment Recommendations  Other  (comment);Clinical research associate with pressure relieving Wheelchair cushion for community; (pt requests scooter for indoors)    Recommendations for Other Services       Precautions / Restrictions Precautions Precautions: Fall Precaution Comments: moniter O2 Restrictions Weight Bearing Restrictions: No      Mobility  Bed Mobility Overal bed mobility: Needs Assistance Bed Mobility: Supine to Sit;Sit to Supine     Supine to sit: Supervision Sit to supine: Min assist   General bed mobility comments: increased time and effort, min A with LEs back onto bed  Transfers Overall transfer level: Needs assistance Equipment used: Rolling walker (2 wheeled) Transfers: Sit to/from Stand Sit to Stand: Min assist         General transfer comment: Needed cues for foot placement as pt keeps feet close together.  Needed steadying assist.  Pt needed controlled descent upon return.    Ambulation/Gait Ambulation/Gait assistance: Min guard Gait Distance (Feet): 35 Feet Assistive device: Rolling walker (2 wheeled) Gait Pattern/deviations: Step-through pattern;Decreased stride length;Drifts right/left;Narrow base of support   Gait velocity interpretation: <1.31 ft/sec, indicative of household ambulator General Gait Details: Pt able to ambulate in room.  Pt desat to 86% on RA with activity, therefore replaced 2LO2 to with ambulation to keep sats >90%.   Stairs            Wheelchair Mobility    Modified Rankin (Stroke Patients Only)       Balance Overall balance assessment: Needs assistance Sitting-balance support: No upper extremity supported;Feet supported Sitting balance-Leahy Scale: Fair  Standing balance support: Bilateral upper extremity supported;During functional activity Standing balance-Leahy Scale: Poor Standing balance comment: relies on UE support                             Pertinent Vitals/Pain Pain Assessment: 0-10 Pain Score: 7  Pain Location:  right abdomen Pain Descriptors / Indicators: Aching;Grimacing;Guarding Pain Intervention(s): Limited activity within patient's tolerance;Monitored during session;Premedicated before session;Repositioned    Home Living Family/patient expects to be discharged to:: Private residence Living Arrangements: Spouse/significant other Available Help at Discharge: Family;Available 24 hours/day Type of Home: House Home Access: Stairs to enter Entrance Stairs-Rails: Left Entrance Stairs-Number of Steps: 3 Home Layout: One level Home Equipment: Bedside commode;Walker - 2 wheels;Cane - single point;Walker - 4 wheels;Grab bars - tub/shower      Prior Function Level of Independence: Independent with assistive device(s)   Gait / Transfers Assistance Needed: Mod I with RW  ADL's / Homemaking Assistance Needed: Bathes self and assist with dressing        Hand Dominance   Dominant Hand: Left    Extremity/Trunk Assessment   Upper Extremity Assessment Upper Extremity Assessment: Generalized weakness    Lower Extremity Assessment Lower Extremity Assessment: Defer to PT evaluation    Cervical / Trunk Assessment Cervical / Trunk Assessment: Normal  Communication   Communication: No difficulties  Cognition Arousal/Alertness: Awake/alert Behavior During Therapy: WFL for tasks assessed/performed Overall Cognitive Status: Within Functional Limits for tasks assessed                                        General Comments General comments (skin integrity, edema, etc.): Husband present.  Husband asking for equipment that he says [facility] lost prescription for.      Exercises     Assessment/Plan    PT Assessment Patient needs continued PT services  PT Problem List Decreased activity tolerance;Decreased balance;Decreased mobility;Decreased knowledge of use of DME;Decreased safety awareness;Decreased knowledge of precautions;Cardiopulmonary status limiting activity       PT  Treatment Interventions DME instruction;Gait training;Stair training;Functional mobility training;Therapeutic activities;Therapeutic exercise;Balance training;Patient/family education    PT Goals (Current goals can be found in the Care Plan section)  Acute Rehab PT Goals Patient Stated Goal: go home PT Goal Formulation: With patient Time For Goal Achievement: 05/12/18 Potential to Achieve Goals: Good    Frequency Min 3X/week   Barriers to discharge        Co-evaluation               AM-PAC PT "6 Clicks" Mobility  Outcome Measure Help needed turning from your back to your side while in a flat bed without using bedrails?: A Little Help needed moving from lying on your back to sitting on the side of a flat bed without using bedrails?: A Little Help needed moving to and from a bed to a chair (including a wheelchair)?: A Little Help needed standing up from a chair using your arms (e.g., wheelchair or bedside chair)?: A Little Help needed to walk in hospital room?: A Little Help needed climbing 3-5 steps with a railing? : A Lot 6 Click Score: 17    End of Session Equipment Utilized During Treatment: Gait belt;Oxygen Activity Tolerance: Patient limited by fatigue Patient left: in bed;with call bell/phone within reach;with family/visitor present;with bed alarm set Nurse Communication: Mobility status PT Visit Diagnosis: Unsteadiness on  feet (R26.81);Muscle weakness (generalized) (M62.81)    Time: 4665-9935 PT Time Calculation (min) (ACUTE ONLY): 28 min   Charges:   PT Evaluation $PT Eval Moderate Complexity: 1 Mod PT Treatments $Gait Training: 8-22 mins        [place] Pager:  [phone]  Office:  [place] 04/28/2018, 12:47 PM

## 2018-04-28 NOTE — Progress Notes (Signed)
Nutrition Education Note  RD consulted for nutrition education regarding new onset CHF.  RD provided "Low Sodium Nutrition Therapy" handout from the Academy of Nutrition and Dietetics. Reviewed patient's dietary recall. Provided examples on ways to decrease sodium intake in diet. Discouraged intake of processed foods and use of salt shaker. Encouraged fresh fruits and vegetables as well as whole grain sources of carbohydrates to maximize fiber intake.   RD discussed why it is important for patient to adhere to diet recommendations, and emphasized the role of fluids, foods to avoid, and importance of weighing self daily. Teach back method used.  RD also consulted for nutrition education regarding diabetes.   Lab Results  Component Value Date   HGBA1C 8.2 (H) 04/27/2018    RD provided "Carbohydrate Counting for People with Diabetes" handout from the Academy of Nutrition and Dietetics. Discussed different food groups and their effects on blood sugar, emphasizing carbohydrate-containing foods. Provided list of carbohydrates and recommended serving sizes of common foods.  Discussed importance of controlled and consistent carbohydrate intake throughout the day. Provided examples of ways to balance meals/snacks and encouraged intake of high-fiber, whole grain complex carbohydrates. Teach back method used.  Expect fair compliance.  Body mass index is 38.53 kg/m. Pt meets criteria for obese based on current BMI.  Current diet order is Heart Healthy/Carb Modified, patient is consuming approximately 80% of meals at this time. Labs and medications reviewed. No further nutrition interventions warranted at this time. RD contact information provided. If additional nutrition issues arise, please re-consult RD.  Zoe Grimmer, MS, RDN, LDN Pager: [phone] Available Mondays and Fridays, 9am-2pm

## 2018-04-28 NOTE — Progress Notes (Signed)
  Echocardiogram 2D Echocardiogram has been performed.  Zoe Clarke 04/28/2018, 12:00 PM

## 2018-04-28 NOTE — Progress Notes (Signed)
Inpatient Diabetes Program Recommendations  AACE/ADA: New Consensus Statement on Inpatient Glycemic Control (2015)  Target Ranges:  Prepandial:   less than 140 mg/dL      Peak postprandial:   less than 180 mg/dL (1-2 hours)      Critically ill patients:  140 - 180 mg/dL   Lab Results  Component Value Date   GLUCAP 121 (H) 04/28/2018   HGBA1C 8.2 (H) 04/27/2018    Review of Glycemic Control Results for Zoe Clarke, Zoe Clarke (MRN [id number]) as of 04/28/2018 11:54  Ref. Range 04/27/2018 09:39 04/27/2018 21:10 04/28/2018 08:10 04/28/2018 11:23  Glucose-Capillary Latest Ref Range: 70 - 99 mg/dL 179 (H) 221 (H) 117 (H) 121 (H)   Diabetes history: DM 2 Outpatient Diabetes medications:  Glucotrol 10 mg bid Current orders for Inpatient glycemic control:  Novolog resistant tid with meals and HS Glucotrol 10 mg bid Inpatient Diabetes Program Recommendations:   While in the hospital, consider holding PO Glucotrol.  Also may consider reducing Novolog correction to moderate tid with meals.   Thanks,  Adah Perl, RN, BC-ADM Inpatient Diabetes Coordinator Pager [phone] (8a-5p)

## 2018-04-28 NOTE — Progress Notes (Signed)
Patient transported for [place]. And ECHO to follow per transpiration. Husband at the bedside and plan to reschedule appt for primary follow up today.discharge in next 2 days.

## 2018-04-28 NOTE — Progress Notes (Signed)
SATURATION QUALIFICATIONS: (This note is used to comply with regulatory documentation for home oxygen)  Patient Saturations on Room Air at Rest = 90%  Patient Saturations on Room Air while Ambulating = 86%  Patient Saturations on 2 Liters of oxygen while Ambulating = 94%  Please briefly explain why patient needs home oxygen:Pt desats on RA with activity.  Pt Needed O2 to keep sats >90%. Thanks.  [place] Pager:  [phone]  Office:  [phone]

## 2018-04-29 ENCOUNTER — Other Ambulatory Visit: Payer: Self-pay

## 2018-04-29 LAB — CBC
HCT: 26.9 % — ABNORMAL LOW (ref 36.0–46.0)
Hemoglobin: 8.2 g/dL — ABNORMAL LOW (ref 12.0–15.0)
MCH: 27 pg (ref 26.0–34.0)
MCHC: 30.5 g/dL (ref 30.0–36.0)
MCV: 88.5 fL (ref 80.0–100.0)
Platelets: 222 10*3/uL (ref 150–400)
RBC: 3.04 MIL/uL — ABNORMAL LOW (ref 3.87–5.11)
RDW: 14.3 % (ref 11.5–15.5)
WBC: 11 10*3/uL — AB (ref 4.0–10.5)
nRBC: 0 % (ref 0.0–0.2)

## 2018-04-29 LAB — BASIC METABOLIC PANEL
Anion gap: 9 (ref 5–15)
BUN: 21 mg/dL (ref 8–23)
CO2: 28 mmol/L (ref 22–32)
Calcium: 8.5 mg/dL — ABNORMAL LOW (ref 8.9–10.3)
Chloride: 104 mmol/L (ref 98–111)
Creatinine, Ser: 2.26 mg/dL — ABNORMAL HIGH (ref 0.44–1.00)
GFR calc Af Amer: 25 mL/min — ABNORMAL LOW (ref >60)
GFR calc non Af Amer: 22 mL/min — ABNORMAL LOW (ref >60)
Glucose, Bld: 86 mg/dL (ref 70–99)
POTASSIUM: 4.8 mmol/L (ref 3.5–5.1)
Sodium: 141 mmol/L (ref 135–145)

## 2018-04-29 LAB — GLUCOSE, CAPILLARY
GLUCOSE-CAPILLARY: 96 mg/dL (ref 70–99)
Glucose-Capillary: 71 mg/dL (ref 70–99)
Glucose-Capillary: 156 mg/dL — ABNORMAL HIGH (ref 70–99)
Glucose-Capillary: 113 mg/dL — ABNORMAL HIGH (ref 70–99)

## 2018-04-29 MED ORDER — AMOXICILLIN-POT CLAVULANATE 875-125 MG PO TABS
1.0000 | ORAL_TABLET | Freq: Two times a day (BID) | ORAL | Status: DC
  Administered 2018-04-29 – 2018-05-01 (×5): 1 via ORAL
  Filled 2018-04-29 (×5): qty 1

## 2018-04-29 MED ORDER — GABAPENTIN 100 MG PO CAPS
100.0000 mg | ORAL_CAPSULE | Freq: Every day | ORAL | Status: DC
  Administered 2018-04-29 – 2018-04-30 (×2): 100 mg via ORAL
  Filled 2018-04-29 (×2): qty 1

## 2018-04-29 NOTE — Progress Notes (Signed)
PT Cancellation Note  Patient Details Name: Zoe Clarke MRN: [id number] DOB: 08-26-1950   Cancelled Treatment:    Reason Eval/Treat Not Completed: Medical issues which prohibited therapy. Shivering and was seen earlier by nursing for management of temp.  Pt declined therapy, not feeling well.   Will try again at another time.   Ramond Dial 04/29/2018, 12:58 PM  Mee Hives, PT MS Acute Rehab Dept. Number: [place] [facility] [place]

## 2018-04-29 NOTE — Progress Notes (Signed)
OT Cancellation Note  Patient Details Name: Zoe Clarke MRN: [id number] DOB: 1950/03/31   Cancelled Treatment:    Reason Eval/Treat Not Completed: Fatigue/lethargy limiting ability to participate. Pt continues to reports that she does not feel well and is shivering. Pt in bed eating lunch and tearful. Will check back on pt tomorrow  Britt Bottom 04/29/2018, 1:30 PM

## 2018-04-29 NOTE — Plan of Care (Signed)
  Problem: Pain Managment: Goal: General experience of comfort will improve Outcome: Progressing   Problem: Safety: Goal: Ability to remain free from injury will improve Outcome: Progressing   Problem: Skin Integrity: Goal: Risk for impaired skin integrity will decrease Outcome: Progressing   

## 2018-04-29 NOTE — Progress Notes (Signed)
PROGRESS NOTE    Zoe Clarke  YQM:[id number] DOB: 1950/04/05 DOA: 04/27/2018 PCP: Antony Contras, MD  Brief Narrative: 68 year old female with history of diabetes mellitus, asthma, hypertension, chronic kidney disease, peptic ulcer disease, morbid obesity was recently hospitalized with abdominal pain diagnosed with cholelithiasis and biliary dyskinesia, advised outpatient follow-up with general surgery was discharged home on 1/27. -With progressive shortness of breath, orthopnea and leg swelling for 3 days. -She reports having intermittent leg swelling for over a year, used to be on HCTZ which she discontinued due to a questionable allergy. -In the emergency room she was mildly hypoxemic, chest x-ray was consistent with acute pulmonary edema, BNP was 630  Assessment & Plan:     Acute diastolic CHF -Clinically improving with diuresis, -Echo completed yesterday noted normal EF and wall motion -Bump in creatinine from 1.8 to 2.2 this morning -We will hold diuretic dose today -Started on Imdur, hydralazine on admission  Possible pneumonia -Repeat chest x-ray with right lower lobe infiltrate -Given low-grade temps, mild leukocytosis will treat with 5-day course of Augmentin  Cholelithiasis, biliary sludge -Recent hospitalization reviewed, ultrasound does not note gallbladder wall edema, no abdominal pain nausea vomiting or right upper quadrant tenderness -Seen by general surgery last week, outpatient follow-up recommended  AKi on CKD3 -Baseline creatinine around 1.8 -Worsened with diuresis to 2.2 range -Hold further diuretics today, monitor creatinine   Diabetes mellitus type 2, uncontrolled, with complications (HCC) -CBG stable, monitor  Polypharmacy, metabolic encephalopathy -Sluggish, drowsy in the setting of polypharmacy namely Percocet, gabapentin, Xanax, muscle relaxer -We will decrease gabapentin dose, discontinue muscle relaxer Xanax and Percocet  COPD/tobacco  abuse -Counseled, stable, no wheezing, nebs as needed  GERD -Continue PPI  Chronic anemia -Likely due to CKD worsened in the setting of volume overloaded state/hemodilution -Monitor with diuresis  DVT prophylaxis: Lovenox Code Status: Full code Family Communication: Spouse at bedside Disposition Plan: Home pending clinical improvement  Consultants:      Procedures:   Antimicrobials:    Subjective: -More sleepy, tired this morning, breathing overall improving  Objective: Vitals:   04/29/18 0126 04/29/18 0222 04/29/18 0312 04/29/18 1028  BP: (!) 186/71  (!) 165/60 (!) 157/74  Pulse: 84  72 75  Resp:   20 19  Temp:   99.7 F (37.6 C) 99.5 F (37.5 C)  TempSrc:   Oral Oral  SpO2: 99%  100% 93%  Weight:  113.9 kg    Height:        Intake/Output Summary (Last 24 hours) at 04/29/2018 1312 Last data filed at 04/29/2018 0127 Gross per 24 hour  Intake -  Output 950 ml  Net -950 ml   Filed Weights   04/27/18 1904 04/28/18 0542 04/29/18 0222  Weight: 116.1 kg 114.9 kg 113.9 kg    Examination:  Gen: Awake, Alert, Oriented X 2, chronically ill-appearing, somnolent sitting up in bed, no distress HEENT: PERRLA, Neck supple, no JVD Lungs: Few basilar crackles on the right CVS: RRR,No Gallops,Rubs or new Murmurs Abd: soft, Non tender, non distended, BS present Extremities: Trace edema Skin: no new rashes Psychiatry: Unable to assess   Data Reviewed:   CBC: Recent Labs  Lab 04/27/18 0940 04/27/18 1322 04/28/18 0223 04/29/18 0511  WBC 11.7* 10.6* 9.2 11.0*  NEUTROABS 7.9*  --  6.3  --   HGB 9.0* 8.3* 8.1* 8.2*  HCT 30.0* 27.6* 27.2* 26.9*  MCV 88.2 89.0 89.2 88.5  PLT 231 219 206 528   Basic Metabolic Panel: Recent Labs  Lab 04/27/18 0940 04/27/18 1322 04/28/18 0223 04/29/18 0511  NA 140  --  140 141  K 4.3  --  4.8 4.8  CL 105  --  107 104  CO2 26  --  28 28  GLUCOSE 194*  --  163* 86  BUN 14  --  16 21  CREATININE 1.66* 1.64* 1.86* 2.26*   CALCIUM 8.6*  --  8.1* 8.5*  MG  --  2.0  --   --    GFR: Estimated Creatinine Clearance: 32.4 mL/min (A) (by C-G formula based on SCr of 2.26 mg/dL (H)). Liver Function Tests: Recent Labs  Lab 04/27/18 0940 04/28/18 0223  AST 15 10*  ALT 19 16  ALKPHOS 87 77  BILITOT 0.6 0.7  PROT 6.6 5.8*  ALBUMIN 2.9* 2.5*   Recent Labs  Lab 04/27/18 0940  LIPASE 22   No results for input(s): AMMONIA in the last 168 hours. Coagulation Profile: No results for input(s): INR, PROTIME in the last 168 hours. Cardiac Enzymes: Recent Labs  Lab 04/27/18 1322 04/27/18 1935 04/28/18 0223  TROPONINI <0.03 <0.03 <0.03   BNP (last 3 results) No results for input(s): PROBNP in the last 8760 hours. HbA1C: Recent Labs    04/27/18 1322  HGBA1C 8.2*   CBG: Recent Labs  Lab 04/28/18 1123 04/28/18 1601 04/28/18 2134 04/29/18 0755 04/29/18 1039  GLUCAP 121* 91 88 71 96   Lipid Profile: No results for input(s): CHOL, HDL, LDLCALC, TRIG, CHOLHDL, LDLDIRECT in the last 72 hours. Thyroid Function Tests: Recent Labs    04/27/18 1322  TSH 3.871   Anemia Panel: No results for input(s): VITAMINB12, FOLATE, FERRITIN, TIBC, IRON, RETICCTPCT in the last 72 hours. Urine analysis:    Component Value Date/Time   COLORURINE YELLOW 04/27/2018 1334   APPEARANCEUR CLEAR 04/27/2018 1334   LABSPEC 1.012 04/27/2018 1334   PHURINE 7.0 04/27/2018 1334   GLUCOSEU 50 (A) 04/27/2018 1334   HGBUR SMALL (A) 04/27/2018 1334   BILIRUBINUR NEGATIVE 04/27/2018 1334   BILIRUBINUR negative 02/25/2015 1426   KETONESUR NEGATIVE 04/27/2018 1334   PROTEINUR 100 (A) 04/27/2018 1334   UROBILINOGEN 0.2 02/25/2015 1426   UROBILINOGEN 1.0 04/16/2009 0943   NITRITE NEGATIVE 04/27/2018 1334   LEUKOCYTESUR NEGATIVE 04/27/2018 1334   Sepsis Labs: @LABRCNTIP (procalcitonin:4,lacticidven:4)  ) Recent Results (from the past 240 hour(s))  Urine culture     Status: Abnormal   Collection Time: 04/27/18  1:34 PM   Result Value Ref Range Status   Specimen Description URINE, CLEAN CATCH  Final   Special Requests NONE  Final   Culture (A)  Final    <10,000 COLONIES/mL INSIGNIFICANT GROWTH Performed at [facility], [place] [address], [place], [place] [address]    Report Status 04/28/2018 FINAL  Final         Radiology Studies: Dg Chest 2 View  Result Date: 04/28/2018 CLINICAL DATA:  Shortness of breath with hypertension and congestive heart failure. Chronic bronchitis. EXAM: CHEST - 2 VIEW COMPARISON:  04/27/2018 FINDINGS: Lungs are adequately inflated with focal opacification over the right base slightly more confluent. Likely small amount layering posterior bilateral pleural fluid without significant change. Borderline stable cardiomegaly. Remainder of the exam is unchanged. IMPRESSION: Right base airspace opacification slightly more confluent and likely due to infection. Small amount of posterior layering pleural fluid bilaterally without significant change. Electronically Signed   By: Marin Olp M.D.   On: 04/28/2018 09:13        Scheduled Meds: .  aspirin EC  81 mg Oral Daily  . atorvastatin  40 mg Oral Daily  . carvedilol  3.125 mg Oral BID WC  . enoxaparin (LOVENOX) injection  40 mg Subcutaneous Q24H  . gabapentin  100 mg Oral QHS  . insulin aspart  0-15 Units Subcutaneous TID WC  . insulin aspart  0-5 Units Subcutaneous QHS  . isosorbide-hydrALAZINE  1 tablet Oral TID  . loratadine  10 mg Oral Daily  . potassium chloride  10 mEq Oral BID  . sodium chloride flush  3 mL Intravenous Q12H   Continuous Infusions: . sodium chloride       LOS: 2 days    Time spent: 31min    Domenic Polite, MD [facility]  04/29/2018, 1:12 PM

## 2018-04-29 NOTE — Progress Notes (Signed)
OT Cancellation Note  Patient Details Name: Zoe Clarke MRN: [id number] DOB: 04-29-1950   Cancelled Treatment:    Reason Eval/Treat Not Completed: Fatigue/lethargy limiting ability to participate;Other (comment). Pt in bed upon arrival with NT and mobility tech present. Pt shivering and with temp of 99.5. Will check on pt later as appropriate  Britt Bottom 04/29/2018, 10:36 AM

## 2018-04-30 LAB — COMPREHENSIVE METABOLIC PANEL
ALT: 12 U/L (ref 0–44)
AST: 11 U/L — AB (ref 15–41)
Albumin: 2.7 g/dL — ABNORMAL LOW (ref 3.5–5.0)
Alkaline Phosphatase: 70 U/L (ref 38–126)
Anion gap: 7 (ref 5–15)
BUN: 21 mg/dL (ref 8–23)
CO2: 29 mmol/L (ref 22–32)
Calcium: 8.3 mg/dL — ABNORMAL LOW (ref 8.9–10.3)
Chloride: 106 mmol/L (ref 98–111)
Creatinine, Ser: 2.22 mg/dL — ABNORMAL HIGH (ref 0.44–1.00)
GFR calc Af Amer: 26 mL/min — ABNORMAL LOW (ref >60)
GFR calc non Af Amer: 22 mL/min — ABNORMAL LOW (ref >60)
Glucose, Bld: 107 mg/dL — ABNORMAL HIGH (ref 70–99)
Potassium: 4.9 mmol/L (ref 3.5–5.1)
Sodium: 142 mmol/L (ref 135–145)
Total Bilirubin: 0.8 mg/dL (ref 0.3–1.2)
Total Protein: 6.1 g/dL — ABNORMAL LOW (ref 6.5–8.1)

## 2018-04-30 LAB — CBC
HCT: 27.6 % — ABNORMAL LOW (ref 36.0–46.0)
Hemoglobin: 8.5 g/dL — ABNORMAL LOW (ref 12.0–15.0)
MCH: 26.9 pg (ref 26.0–34.0)
MCHC: 30.8 g/dL (ref 30.0–36.0)
MCV: 87.3 fL (ref 80.0–100.0)
NRBC: 0 % (ref 0.0–0.2)
Platelets: 233 10*3/uL (ref 150–400)
RBC: 3.16 MIL/uL — ABNORMAL LOW (ref 3.87–5.11)
RDW: 14.2 % (ref 11.5–15.5)
WBC: 9.7 10*3/uL (ref 4.0–10.5)

## 2018-04-30 LAB — GLUCOSE, CAPILLARY
Glucose-Capillary: 121 mg/dL — ABNORMAL HIGH (ref 70–99)
Glucose-Capillary: 119 mg/dL — ABNORMAL HIGH (ref 70–99)
Glucose-Capillary: 148 mg/dL — ABNORMAL HIGH (ref 70–99)
Glucose-Capillary: 126 mg/dL — ABNORMAL HIGH (ref 70–99)

## 2018-04-30 MED ORDER — DOCUSATE SODIUM 100 MG PO CAPS
100.0000 mg | ORAL_CAPSULE | Freq: Two times a day (BID) | ORAL | Status: DC
  Administered 2018-04-30 – 2018-05-01 (×2): 100 mg via ORAL
  Filled 2018-04-30 (×2): qty 1

## 2018-04-30 MED ORDER — POLYETHYLENE GLYCOL 3350 17 G PO PACK
17.0000 g | PACK | Freq: Every day | ORAL | Status: DC
  Filled 2018-04-30: qty 1

## 2018-04-30 NOTE — Progress Notes (Signed)
Physical Therapy Treatment Patient Details Name: Zoe Clarke MRN: [id number] DOB: 1950/10/03 Today's Date: 04/30/2018    History of Present Illness Pt is a 68 y.o. female admitted 04/27/18 with SOB and wheezing. Worked up for CHF, possible PNA, AKI on CKD 3. PMH includes DM, HTN, CKD, asthma, morbid obesity.   PT Comments    Pt slowly progressing with mobility. Max encouragement for OOB mobility. Ambulatory in room with RW and intermittent min guard for balance; performing ADLs at supervision-level. SpO2 >/89% on RA. Remains limited by decreased activity tolerance. Increased time spent discussing current condition, importance of mobility and fall risk reduction; pt continues to request a wheelchair and would benefit from one for longer distances. Continue to recommend HHPT services; pt declining.   Follow Up Recommendations  Home health PT;Supervision/Assistance - 24 hour(pt declining)     Equipment Recommendations  Other (comment);Wheelchair (measurements PT);Wheelchair cushion (measurements PT)(scooter)    Recommendations for Other Services       Precautions / Restrictions Precautions Precautions: Fall Restrictions Weight Bearing Restrictions: No    Mobility  Bed Mobility Overal bed mobility: Needs Assistance Bed Mobility: Supine to Sit;Sit to Supine     Supine to sit: Modified independent (Device/Increase time) Sit to supine: Min assist   General bed mobility comments: increased time and effort, min A with LEs back into bed. Pt able to reposition self independently  Transfers Overall transfer level: Needs assistance Equipment used: Rolling walker (2 wheeled) Transfers: Sit to/from Stand Sit to Stand: Supervision;From elevated surface         General transfer comment: Able to stand from elevated bed height and BSC with RW and supervision; heavy reliance on BUE support to push into standing. Poor eccentric control sitting on lower  surfaces  Ambulation/Gait Ambulation/Gait assistance: Min guard;Supervision Gait Distance (Feet): 25 Feet Assistive device: Rolling walker (2 wheeled) Gait Pattern/deviations: Step-through pattern;Decreased stride length;Wide base of support;Trunk flexed Gait velocity: Decreased Gait velocity interpretation: <1.31 ft/sec, indicative of household ambulator General Gait Details: Only agreeable to ambulation in room; RW and intermittent min guard for balance, seated rest on BSC. SpO2 down to 89% on RA, returning to 94% with seated rest   Stairs             Wheelchair Mobility    Modified Rankin (Stroke Patients Only)       Balance Overall balance assessment: Needs assistance Sitting-balance support: No upper extremity supported;Feet supported Sitting balance-Leahy Scale: Fair Sitting balance - Comments: Required assist to don socks (reports husband does so at home)   Standing balance support: Bilateral upper extremity supported;During functional activity Standing balance-Leahy Scale: Poor Standing balance comment: Reliant on UE support                            Cognition Arousal/Alertness: Awake/alert Behavior During Therapy: WFL for tasks assessed/performed Overall Cognitive Status: Within Functional Limits for tasks assessed                                 General Comments: WFL although poor insight into current condition      Exercises      General Comments        Pertinent Vitals/Pain Pain Assessment: Faces Faces Pain Scale: Hurts a little bit Pain Location: R ankle Pain Descriptors / Indicators: Sore Pain Intervention(s): Limited activity within patient's tolerance    Home Living  Prior Function            PT Goals (current goals can now be found in the care plan section) Acute Rehab PT Goals Patient Stated Goal: go home, get a wheelchair PT Goal Formulation: With patient Time For Goal  Achievement: 05/12/18 Potential to Achieve Goals: Good Progress towards PT goals: Progressing toward goals    Frequency    Min 3X/week      PT Plan Current plan remains appropriate    Co-evaluation              AM-PAC PT "6 Clicks" Mobility   Outcome Measure  Help needed turning from your back to your side while in a flat bed without using bedrails?: None Help needed moving from lying on your back to sitting on the side of a flat bed without using bedrails?: A Little Help needed moving to and from a bed to a chair (including a wheelchair)?: A Little Help needed standing up from a chair using your arms (e.g., wheelchair or bedside chair)?: A Little Help needed to walk in hospital room?: A Little Help needed climbing 3-5 steps with a railing? : A Lot 6 Click Score: 18    End of Session Equipment Utilized During Treatment: Gait belt Activity Tolerance: Patient tolerated treatment well;Patient limited by fatigue Patient left: in bed;with call bell/phone within reach;with bed alarm set Nurse Communication: Mobility status PT Visit Diagnosis: Unsteadiness on feet (R26.81);Muscle weakness (generalized) (M62.81)     Time: 1113-1140 PT Time Calculation (min) (ACUTE ONLY): 27 min  Charges:  $Therapeutic Activity: 8-22 mins $Self Care/Home Management: 8-22                    Zoe Clarke, PT, DPT Acute Rehabilitation Services  Pager [phone] Office [phone]  Zoe Clarke 04/30/2018, 12:02 PM

## 2018-04-30 NOTE — Progress Notes (Signed)
Pt. Requesting med for nausea. On call for [facility] paged to make aware. Jacobe Study, Katherine Roan

## 2018-04-30 NOTE — Consult Note (Signed)
[facility] CM Inpatient Consult   04/30/2018  Zoe Clarke 04/03/1950 [id number]  Patient assessed for unplanned readmission and  to check if potential [facility] Care Management services needs.  Patient had been outreached by [facility] Telephonic nurse for blood pressure follow up.  Patient was hospitalized for chief complaint of shortness of breath and abdominal pain. Per MD notes in History and physical:  Zoe Clarke is a 68 y.o. female with medical history significant of diabetes, asthma, hypertension, chronic kidney disease, gastric ulcer, and super morbid obesity who presents to the emergency department complaining of shortness of breath and wheezing for 3 days.  Used her inhaler with partial relief.  She has had intermittent middle sharp nonradiating chest pain which began last night.  She has no chest pain at the present time.  She has not had any recent travel, or recent surgery but does complain of lower extremity bilateral leg edema.  She states nothing makes her symptoms better or worse.  She denies using oxygen at home.  She was admitted on 04/10/2018 for nausea and abdominal pain was diagnosed with a calculus cholecystitis and follow-up with surgery as an outpatient was recommended.  Surgery has requested a cardiology clearance before cholecystectomy.  Had intermittent sharp upper abdominal pain which began today she is she states the last time she ate was 1 AM this morning she had shrimp fried rice and an egg roll.  She has had nausea and one episode of vomiting in the emergency department.  She has had no diarrhea her last bowel movement was yesterday and was normal.  She has had a dry cough for 2 weeks.  She is had CT of the abdomen on 04/10/2018 that revealed changes suggestive of right-sided focal pyelonephritis, cholelithiasis without complicating factors and diverticulosis without diverticulitis. Met with the patient at the bedside.  Patient was resting and states that  her family had just left.  She states she is "here".  Se denies any issues with transportation for post hospital appointments. She uses [employer]  She gets her medications delivered.  She has good food resources with meals on wheels. She states she has good family support.  Explained to her about [facility] Care Management with post hospital follow up if needed.  She states all she really needs is a shower chair over her tub as her husband is very helpful in her getting dressed and he cooks meals as well. She states she will accept a brochure and 24 hour nurse advise line magnet.  Placed on her bedside table.  She states her telephone is getting upgraded and she will probably have a new phone number. "But, I will call if I have any needs and I will need to update everyone on my new number."  Patient states she has no current needs. She checks her blood pressure at home and encouraged to weigh daily.  Staff came in t provide personal care. Will follow for progress.  Inpatient RNCM has assigned the patient to EMMI HF calls noted.  Primary Care Provider is  Dr. David Swayne and this office provides the transition of care follow up. Pharmacy is:  [facility] and she states they deliver. Please place a [facility] Care Management consult or for questions contact:   Victoria Brewer, RN BSN CCM [facility] Liaison  [phone] business mobile phone Toll free office [phone]   

## 2018-04-30 NOTE — Care Management Note (Signed)
Case Management Note  Patient Details  Name: Alaysha Jefcoat MRN: [id number] Date of Birth: [birth year]  Subjective/Objective: CHF                 Action/Plan: Patient lives at home with her spouse; PCP: Antony Contras, MD; has private insurance with [employer] with prescription drug coverage; pharmacy of choice is [facility] on [address] - they deliver medication to her home; use [employer] for transportation needs; DME - walker; [place] choice offered, pt refused; CM informed her that if she changed her mind after discharge and wanted [facility] services, her PCP can make arrangements from the office.  Expected Discharge Date:    possibly 05/04/2018              Expected Discharge Plan:  [place]  In-House Referral:   [facility]  Discharge planning Services  CM Consult Choice offered to:  Patient  HH Arranged:  Patient Refused HH  Status of Service:  In process, will continue to follow  Sherrilyn Rist [phone] 04/30/2018, 10:53 AM

## 2018-04-30 NOTE — Progress Notes (Addendum)
PROGRESS NOTE    Zoe Clarke  HFW:[id number] DOB: Jul 26, 1950 DOA: 04/27/2018 PCP: Antony Contras, MD  Brief Narrative: 68 year old female with history of diabetes mellitus, asthma, hypertension, chronic kidney disease, peptic ulcer disease, morbid obesity was recently hospitalized with abdominal pain diagnosed with cholelithiasis and biliary dyskinesia, advised outpatient follow-up with general surgery was discharged home on 1/27. -With progressive shortness of breath, orthopnea and leg swelling for 3 days. -She reports having intermittent leg swelling for over a year, used to be on HCTZ which she discontinued due to a questionable allergy. -In the emergency room she was mildly hypoxemic, chest x-ray was consistent with acute pulmonary edema, BNP was 630  Assessment & Plan:     Acute diastolic CHF -Clinically improving with diuresis, -Echo completed yesterday noted normal EF and wall motion -Bump in creatinine from 1.8 to 2.2 this morning -Continue to hold diuretic.  Will monitor. -Started on Imdur, hydralazine on admission  Possible pneumonia -Repeat chest x-ray with right lower lobe infiltrate -Given low-grade temps, mild leukocytosis will treat with 5-day course of Augmentin  Cholelithiasis, biliary sludge -Recent hospitalization reviewed, ultrasound does not note gallbladder wall edema, no abdominal pain nausea vomiting or right upper quadrant tenderness -Seen by general surgery last week, outpatient follow-up recommended  AKi on CKD3 -Baseline creatinine around 1.8 -Worsened with diuresis to 2.2 range -Hold further diuretics today, monitor creatinine   Diabetes mellitus type 2, uncontrolled, with complications (HCC) -CBG stable, monitor  Polypharmacy, metabolic encephalopathy -Sluggish, drowsy in the setting of polypharmacy namely Percocet, gabapentin, Xanax, muscle relaxer -We will decrease gabapentin dose, discontinue muscle relaxer Xanax and Percocet  COPD/tobacco  abuse -Counseled, stable, no wheezing, nebs as needed  GERD -Continue PPI  Chronic anemia -Likely due to CKD worsened in the setting of volume overloaded state/hemodilution -Monitor with diuresis  DVT prophylaxis: Lovenox Code Status: Full code Family Communication: Spouse at bedside Disposition Plan: Home pending clinical improvement  Consultants:      Procedures:   Antimicrobials:    Subjective: Reports her chronic back pain.  No nausea no vomiting no fever no chills.  Reports constipated.  Objective: Vitals:   04/29/18 1613 04/29/18 1947 04/30/18 0611 04/30/18 1821  BP: 115/72 133/75 (!) 162/75 (!) 158/78  Pulse: (!) 107 93 (!) 108 95  Resp: 16 20 20    Temp: 99.5 F (37.5 C) 98.4 F (36.9 C) 98.9 F (37.2 C) 98.6 F (37 C)  TempSrc: Oral Oral Oral Oral  SpO2: 100% 100% 98% 94%  Weight:   112.4 kg   Height:        Intake/Output Summary (Last 24 hours) at 04/30/2018 1917 Last data filed at 04/30/2018 1852 Gross per 24 hour  Intake 723 ml  Output 1350 ml  Net -627 ml   Filed Weights   04/28/18 0542 04/29/18 0222 04/30/18 0611  Weight: 114.9 kg 113.9 kg 112.4 kg    Examination:  Gen: Awake, Alert, Oriented X 2, chronically ill-appearing, somnolent sitting up in bed, no distress HEENT: PERRLA, Neck supple, no JVD Lungs: Few basilar crackles on the right CVS: RRR,No Gallops,Rubs or new Murmurs Abd: soft, Non tender, non distended, BS present Extremities: Trace edema Skin: no new rashes Psychiatry: Unable to assess   Data Reviewed:   CBC: Recent Labs  Lab 04/27/18 0940 04/27/18 1322 04/28/18 0223 04/29/18 0511 04/30/18 0422  WBC 11.7* 10.6* 9.2 11.0* 9.7  NEUTROABS 7.9*  --  6.3  --   --   HGB 9.0* 8.3* 8.1* 8.2* 8.5*  HCT 30.0* 27.6* 27.2* 26.9* 27.6*  MCV 88.2 89.0 89.2 88.5 87.3  PLT 231 219 206 222 086   Basic Metabolic Panel: Recent Labs  Lab 04/27/18 0940 04/27/18 1322 04/28/18 0223 04/29/18 0511 04/30/18 0422  NA 140  --   140 141 142  K 4.3  --  4.8 4.8 4.9  CL 105  --  107 104 106  CO2 26  --  28 28 29   GLUCOSE 194*  --  163* 86 107*  BUN 14  --  16 21 21   CREATININE 1.66* 1.64* 1.86* 2.26* 2.22*  CALCIUM 8.6*  --  8.1* 8.5* 8.3*  MG  --  2.0  --   --   --    GFR: Estimated Creatinine Clearance: 32.8 mL/min (A) (by C-G formula based on SCr of 2.22 mg/dL (H)). Liver Function Tests: Recent Labs  Lab 04/27/18 0940 04/28/18 0223 04/30/18 0422  AST 15 10* 11*  ALT 19 16 12   ALKPHOS 87 77 70  BILITOT 0.6 0.7 0.8  PROT 6.6 5.8* 6.1*  ALBUMIN 2.9* 2.5* 2.7*   Recent Labs  Lab 04/27/18 0940  LIPASE 22   No results for input(s): AMMONIA in the last 168 hours. Coagulation Profile: No results for input(s): INR, PROTIME in the last 168 hours. Cardiac Enzymes: Recent Labs  Lab 04/27/18 1322 04/27/18 1935 04/28/18 0223  TROPONINI <0.03 <0.03 <0.03   BNP (last 3 results) No results for input(s): PROBNP in the last 8760 hours. HbA1C: No results for input(s): HGBA1C in the last 72 hours. CBG: Recent Labs  Lab 04/29/18 1627 04/29/18 2114 04/30/18 0759 04/30/18 1231 04/30/18 1700  GLUCAP 156* 113* 121* 119* 148*   Lipid Profile: No results for input(s): CHOL, HDL, LDLCALC, TRIG, CHOLHDL, LDLDIRECT in the last 72 hours. Thyroid Function Tests: No results for input(s): TSH, T4TOTAL, FREET4, T3FREE, THYROIDAB in the last 72 hours. Anemia Panel: No results for input(s): VITAMINB12, FOLATE, FERRITIN, TIBC, IRON, RETICCTPCT in the last 72 hours. Urine analysis:    Component Value Date/Time   COLORURINE YELLOW 04/27/2018 1334   APPEARANCEUR CLEAR 04/27/2018 1334   LABSPEC 1.012 04/27/2018 1334   PHURINE 7.0 04/27/2018 1334   GLUCOSEU 50 (A) 04/27/2018 1334   HGBUR SMALL (A) 04/27/2018 1334   BILIRUBINUR NEGATIVE 04/27/2018 1334   BILIRUBINUR negative 02/25/2015 1426   KETONESUR NEGATIVE 04/27/2018 1334   PROTEINUR 100 (A) 04/27/2018 1334   UROBILINOGEN 0.2 02/25/2015 1426    UROBILINOGEN 1.0 04/16/2009 0943   NITRITE NEGATIVE 04/27/2018 1334   LEUKOCYTESUR NEGATIVE 04/27/2018 1334   Sepsis Labs: @LABRCNTIP (procalcitonin:4,lacticidven:4)  ) Recent Results (from the past 240 hour(s))  Urine culture     Status: Abnormal   Collection Time: 04/27/18  1:34 PM  Result Value Ref Range Status   Specimen Description URINE, CLEAN CATCH  Final   Special Requests NONE  Final   Culture (A)  Final    <10,000 COLONIES/mL INSIGNIFICANT GROWTH Performed at [facility] [address], [place], [place] [address]    Report Status 04/28/2018 FINAL  Final         Radiology Studies: No results found.      Scheduled Meds: . amoxicillin-clavulanate  1 tablet Oral Q12H  . aspirin EC  81 mg Oral Daily  . atorvastatin  40 mg Oral Daily  . carvedilol  3.125 mg Oral BID WC  . docusate sodium  100 mg Oral BID  . enoxaparin (LOVENOX) injection  40 mg Subcutaneous Q24H  .  gabapentin  100 mg Oral QHS  . insulin aspart  0-15 Units Subcutaneous TID WC  . insulin aspart  0-5 Units Subcutaneous QHS  . isosorbide-hydrALAZINE  1 tablet Oral TID  . loratadine  10 mg Oral Daily  . polyethylene glycol  17 g Oral Daily  . sodium chloride flush  3 mL Intravenous Q12H   Continuous Infusions: . sodium chloride       LOS: 3 days    Time spent: 54min    Author:  Berle Mull, MD [facility] 04/30/2018   To reach On-call, see care teams to locate the attending and reach out to them via [email or web address]. If 7PM-7AM, please contact night-coverage If you still have difficulty reaching the attending provider, please page the [facility] (Director on Call) for [facility] on amion for assistance.

## 2018-04-30 NOTE — Progress Notes (Signed)
Occupational Therapy Treatment Patient Details Name: Zoe Clarke MRN: [id number] DOB: 28-Aug-1950 Today's Date: 04/30/2018    History of present illness Pt is a 68 y.o. female admitted 04/27/18 with SOB and wheezing. Worked up for CHF, possible PNA, AKI on CKD 3. PMH includes DM, HTN, CKD, asthma, morbid obesity.   OT comments  Pt performed toileting, UB dressing and standing grooming with set up to supervision. Pt requesting tub transfer bench, not likely covered by her insurance. It is not her goal to work toward independence in [place]. Educated in energy conservation strategies. Sounds like pt leads a sedentary lifestyle.   Follow Up Recommendations  Supervision - Intermittent    Equipment Recommendations  Tub/shower bench(pt is aware her insurance does not cover)    Recommendations for Other Services      Precautions / Restrictions Precautions Precautions: Fall Restrictions Weight Bearing Restrictions: No       Mobility Bed Mobility Overal bed mobility: Needs Assistance Bed Mobility: Supine to Sit;Sit to Supine     Supine to sit: Modified independent (Device/Increase time) Sit to supine: Min assist   General bed mobility comments: increased time and effort, min A with LEs back into bed. Pt able to reposition self independently  Transfers Overall transfer level: Needs assistance Equipment used: Rolling walker (2 wheeled) Transfers: Sit to/from Stand Sit to Stand: Supervision;From elevated surface         General transfer comment: heavy reliance on UEs to push to standing, decreased control of descent    Balance Overall balance assessment: Needs assistance Sitting-balance support: No upper extremity supported;Feet supported Sitting balance-Leahy Scale: Fair Sitting balance - Comments: Required assist to don socks (reports husband does so at home)   Standing balance support: Bilateral upper extremity supported;During functional activity Standing  balance-Leahy Scale: Poor Standing balance comment: Reliant on UE support, can release walker in static standing                           ADL either performed or assessed with clinical judgement   ADL Overall ADL's : Needs assistance/impaired     Grooming: Wash/dry hands;Standing;Supervision/safety           Upper Body Dressing : Set up;Sitting       Toilet Transfer: Supervision/safety;Ambulation;BSC   Toileting- Water quality scientist and Hygiene: Supervision/safety;Sit to/from stand       Functional mobility during ADLs: Supervision/safety;Rolling walker General ADL Comments: Reinforced energy conservation strategies.      Vision       Perception     Praxis      Cognition Arousal/Alertness: Awake/alert Behavior During Therapy: WFL for tasks assessed/performed Overall Cognitive Status: Within Functional Limits for tasks assessed                                 General Comments: WFL although poor insight into current condition        Exercises     Shoulder Instructions       General Comments      Pertinent Vitals/ Pain       Pain Assessment: No/denies pain Faces Pain Scale: Hurts a little bit Pain Location: R ankle Pain Descriptors / Indicators: Sore Pain Intervention(s): Limited activity within patient's tolerance  Home Living  Prior Functioning/Environment              Frequency  Min 2X/week        Progress Toward Goals  OT Goals(current goals can now be found in the care plan section)  Progress towards OT goals: Progressing toward goals  Acute Rehab OT Goals Patient Stated Goal: go home, get a tub bench OT Goal Formulation: With patient/family Time For Goal Achievement: 05/12/18 Potential to Achieve Goals: Good  Plan Discharge plan remains appropriate    Co-evaluation                 AM-PAC OT "6 Clicks" Daily Activity     Outcome  Measure   Help from another person eating meals?: None Help from another person taking care of personal grooming?: A Little Help from another person toileting, which includes using toliet, bedpan, or urinal?: A Little Help from another person bathing (including washing, rinsing, drying)?: A Lot Help from another person to put on and taking off regular upper body clothing?: None Help from another person to put on and taking off regular lower body clothing?: A Lot 6 Click Score: 18    End of Session Equipment Utilized During Treatment: Gait belt;Rolling walker  OT Visit Diagnosis: Unsteadiness on feet (R26.81);Other abnormalities of gait and mobility (R26.89);Muscle weakness (generalized) (M62.81);Pain Pain - Right/Left: Right   Activity Tolerance Patient tolerated treatment well   Patient Left in bed;with call bell/phone within reach   Nurse Communication          Time: 9432-0037 OT Time Calculation (min): 17 min  Charges: OT General Charges $OT Visit: 1 Visit OT Treatments $Self Care/Home Management : 8-22 mins  Nestor Lewandowsky, OTR/L Acute Rehabilitation Services Pager: [phone] Office: [phone]   Malka So 04/30/2018, 12:52 PM

## 2018-04-30 NOTE — Care Management Important Message (Signed)
Important Message  Patient Details  Name: Zoe Clarke MRN: [id number] Date of Birth: December 19, 1950   Medicare Important Message Given:  Yes    [name] P [name] 04/30/2018, 4:28 PM

## 2018-05-01 LAB — CBC WITH DIFFERENTIAL/PLATELET
Abs Immature Granulocytes: 0.04 10*3/uL (ref 0.00–0.07)
Basophils Absolute: 0 10*3/uL (ref 0.0–0.1)
Basophils Relative: 0 %
Eosinophils Absolute: 0.3 10*3/uL (ref 0.0–0.5)
Eosinophils Relative: 4 %
HCT: 26 % — ABNORMAL LOW (ref 36.0–46.0)
HEMOGLOBIN: 8 g/dL — AB (ref 12.0–15.0)
Immature Granulocytes: 1 %
Lymphocytes Relative: 20 %
Lymphs Abs: 1.7 10*3/uL (ref 0.7–4.0)
MCH: 27.4 pg (ref 26.0–34.0)
MCHC: 30.8 g/dL (ref 30.0–36.0)
MCV: 89 fL (ref 80.0–100.0)
MONOS PCT: 9 %
Monocytes Absolute: 0.7 10*3/uL (ref 0.1–1.0)
Neutro Abs: 5.9 10*3/uL (ref 1.7–7.7)
Neutrophils Relative %: 66 %
Platelets: 219 10*3/uL (ref 150–400)
RBC: 2.92 MIL/uL — ABNORMAL LOW (ref 3.87–5.11)
RDW: 14.5 % (ref 11.5–15.5)
WBC: 8.7 10*3/uL (ref 4.0–10.5)
nRBC: 0 % (ref 0.0–0.2)

## 2018-05-01 LAB — COMPREHENSIVE METABOLIC PANEL
ALT: 12 U/L (ref 0–44)
AST: 14 U/L — ABNORMAL LOW (ref 15–41)
Albumin: 2.5 g/dL — ABNORMAL LOW (ref 3.5–5.0)
Alkaline Phosphatase: 62 U/L (ref 38–126)
Anion gap: 8 (ref 5–15)
BUN: 22 mg/dL (ref 8–23)
CO2: 24 mmol/L (ref 22–32)
CREATININE: 2.06 mg/dL — AB (ref 0.44–1.00)
Calcium: 8.1 mg/dL — ABNORMAL LOW (ref 8.9–10.3)
Chloride: 109 mmol/L (ref 98–111)
GFR calc Af Amer: 28 mL/min — ABNORMAL LOW (ref >60)
GFR calc non Af Amer: 24 mL/min — ABNORMAL LOW (ref >60)
Glucose, Bld: 129 mg/dL — ABNORMAL HIGH (ref 70–99)
Potassium: 5 mmol/L (ref 3.5–5.1)
Sodium: 141 mmol/L (ref 135–145)
Total Bilirubin: 0.9 mg/dL (ref 0.3–1.2)
Total Protein: 5.6 g/dL — ABNORMAL LOW (ref 6.5–8.1)

## 2018-05-01 LAB — GLUCOSE, CAPILLARY
Glucose-Capillary: 119 mg/dL — ABNORMAL HIGH (ref 70–99)
Glucose-Capillary: 130 mg/dL — ABNORMAL HIGH (ref 70–99)

## 2018-05-01 MED ORDER — GABAPENTIN 100 MG PO CAPS: 100.0000 mg | ORAL_CAPSULE | Freq: Every day | ORAL | 0 refills | Status: DC

## 2018-05-01 MED ORDER — FUROSEMIDE 20 MG PO TABS: 20.0000 mg | ORAL_TABLET | Freq: Every day | ORAL | 0 refills | Status: DC

## 2018-05-01 MED ORDER — ISOSORB DINITRATE-HYDRALAZINE 20-37.5 MG PO TABS: 1.0000 | ORAL_TABLET | Freq: Three times a day (TID) | ORAL | 0 refills | Status: DC

## 2018-05-01 MED ORDER — METHOCARBAMOL 500 MG PO TABS: 500.0000 mg | ORAL_TABLET | Freq: Three times a day (TID) | ORAL | 0 refills | Status: DC | PRN

## 2018-05-01 MED ORDER — DOCUSATE SODIUM 100 MG PO CAPS: 100.0000 mg | ORAL_CAPSULE | Freq: Two times a day (BID) | ORAL | 0 refills | Status: DC

## 2018-05-01 MED ORDER — AMOXICILLIN-POT CLAVULANATE 875-125 MG PO TABS: 1.0000 | ORAL_TABLET | Freq: Two times a day (BID) | ORAL | 0 refills | Status: AC

## 2018-05-01 NOTE — Plan of Care (Signed)

## 2018-05-01 NOTE — Progress Notes (Signed)
Wheelchair and shower stool ordered as requested; Aneta Mins [phone]

## 2018-05-01 NOTE — Discharge Instructions (Signed)
Heart Failure Eating Plan Heart failure, also called congestive heart failure, occurs when your heart does not pump blood well enough to meet your body's needs for oxygen-rich blood. Heart failure is a long-term (chronic) condition. Living with heart failure can be challenging. However, following your health care provider's instructions about a healthy lifestyle and working with a diet and nutrition specialist (dietitian) to choose the right foods may help to improve your symptoms. What are tips for following this plan? General guidelines  Do not eat more than 2,300 mg of salt (sodium) a day. The amount of sodium that is recommended for you may be lower, depending on your condition.  Maintain a healthy body weight as directed. Ask your health care provider what a healthy weight is for you. ? Check your weight every day. ? Work with your health care provider and dietitian to make a plan that is right for you to lose weight or maintain your current weight.  Limit how much fluid you drink. Ask your health care provider or dietitian how much fluid you can have each day.  Limit or avoid alcohol as told by your health care provider or dietitian. Reading food labels  Check food labels for the amount of sodium per serving. Choose foods that have less than 140 mg (milligrams) of sodium in each serving.  Check food labels for the number of calories per serving. This is important if you need to limit your daily calorie intake to lose weight.  Check food labels for the serving size. If you eat more than one serving, you will be eating more sodium and calories than what is listed on the label.  Look for foods that are labeled as "sodium-free," "very low sodium," or "low sodium." ? Foods labeled as "reduced sodium" or "lightly salted" may still have more sodium than what is recommended for you. Cooking  Avoid adding salt when cooking. Ask your health care provider or dietitian before using salt  substitutes.  Season food with salt-free seasonings, spices, or herbs. Check the label of seasoning mixes to make sure they do not contain salt.  Cook with heart-healthy oils, such as olive, canola, soybean, or sunflower oil.  Do not fry foods. Cook foods using low-fat methods, such as baking, boiling, grilling, and broiling.  Limit unhealthy fats when cooking by: ? Removing the skin from poultry, such as chicken. ? Removing all visible fats from meats. ? Skimming the fat off from stews, soups, and gravies before serving them. Meal planning   Limit your intake of: ? Processed, canned, or pre-packaged foods. ? Foods that are high in trans fat, such as fried foods. ? Sweets, desserts, sugary drinks, and other foods with added sugar. ? Full-fat dairy products, such as whole milk.  Eat a balanced diet that includes: ? 4-5 servings of fruit each day and 4-5 servings of vegetables each day. At each meal, try to fill half of your plate with fruits and vegetables. ? Up to 6-8 servings of whole grains each day. ? Up to 2 servings of lean meat, poultry, or fish each day. One serving of meat is equal to 3 oz. This is about the same size as a deck of cards. ? 2 servings of low-fat dairy each day. ? Heart-healthy fats. Healthy fats called omega-3 fatty acids are found in foods such as flaxseed and cold-water fish like sardines, salmon, and mackerel.  Aim to eat 25-35 g (grams) of fiber a day. Foods that are high in fiber include  apples, broccoli, carrots, beans, peas, and whole grains.  Do not add salt or condiments that contain salt (such as soy sauce) to foods before eating.  When eating at a restaurant, ask that your food be prepared with less salt or no salt, if possible.  Try to eat 2 or more vegetarian meals each week.  Eat more home-cooked food and eat less restaurant, buffet, and fast food. Recommended foods The items listed may not be a complete list. Talk with your dietitian about  what dietary choices are best for you. Grains Bread with less than 80 mg of sodium per slice. Whole-wheat pasta, quinoa, and brown rice. Oats and oatmeal. Barley. [place]. Grits and cream of wheat. Whole-grain and whole-wheat cold cereal. Vegetables All fresh vegetables. Vegetables that are frozen without sauce or added salt. Low-sodium or sodium-free canned vegetables. Fruits All fresh, frozen, and canned fruits. Dried fruits, such as raisins, prunes, and cranberries. Meats and other protein foods Lean cuts of meat. Skinless chicken and [place]. Fish with high omega-3 fatty acids, such as salmon, sardines, and other cold-water fishes. Eggs. Dried beans, peas, and edamame. Unsalted nuts and nut butters. Dairy Low-fat or nonfat (skim) milk and dried milk. Rice milk, soy milk, and almond milk. Low-fat or nonfat yogurt. Small amounts of reduced-sodium block cheese. Low-sodium cottage cheese. Fats and oils Olive, canola, soybean, flaxseed, or sunflower oil. Avocado. Sweets and desserts Apple sauce. Granola bars. Sugar-free pudding and gelatin. Frozen fruit bars. Seasoning and other foods Fresh and dried herbs. Lemon or lime juice. Vinegar. Low-sodium ketchup. Salt-free marinades, salad dressings, sauces, and seasonings. Foods to avoid The items listed may not be a complete list. Talk with your dietitian about what dietary choices are best for you. Grains Bread with more than 80 mg of sodium per slice. Hot or cold cereal with more than 140 mg sodium per serving. Salted pretzels and crackers. Pre-packaged breadcrumbs. Bagels, croissants, and biscuits. Vegetables Canned vegetables. Frozen vegetables with sauce or seasonings. Creamed vegetables. [place] fries. Onion rings. Pickled vegetables and sauerkraut. Fruits Fruits that are dried with sodium-containing preservatives. Meats and other protein foods Ribs and chicken wings. Bacon, ham, pepperoni, bologna, salami, and packaged luncheon meats. Hot  dogs, bratwurst, and sausage. Canned meat. Smoked meat and fish. Salted nuts and seeds. Dairy Whole milk, half-and-half, and cream. Buttermilk. Processed cheese, cheese spreads, and cheese curds. Regular cottage cheese. Feta cheese. Shredded cheese. String cheese. Fats and oils Butter, lard, shortening, ghee, and bacon fat. Canned and packaged gravies. Seasoning and other foods Onion salt, garlic salt, table salt, and sea salt. Marinades. Regular salad dressings. Relishes, pickles, and olives. Meat flavorings and tenderizers, and bouillon cubes. Horseradish, ketchup, and mustard. Worcestershire sauce. Teriyaki sauce, soy sauce (including reduced sodium). Hot sauce and Tabasco sauce. Steak sauce, fish sauce, oyster sauce, and cocktail sauce. Taco seasonings. Barbecue sauce. Tartar sauce. Summary  A heart failure eating plan includes changes that limit your intake of sodium and unhealthy fat, and it may help you lose weight or maintain a healthy weight. Your health care provider may also recommend limiting how much fluid you drink.  Most people with heart failure should eat no more than 2,300 mg of salt (sodium) a day. The amount of sodium that is recommended for you may be lower, depending on your condition.  Contact your health care provider or dietitian before making any major changes to your diet. This information is not intended to replace advice given to you by your health care provider. Make sure  you discuss any questions you have with your health care provider. Document Released: 07/20/2016 Document Revised: 07/20/2016 Document Reviewed: 07/20/2016 Elsevier Interactive Patient Education  2019 [employer].

## 2018-05-05 ENCOUNTER — Other Ambulatory Visit: Payer: Self-pay

## 2018-05-05 NOTE — Discharge Summary (Signed)
[facility] Discharge Summary   Patient: Zoe Clarke WEX:[id number]   PCP: Antony Contras, MD DOB: [birth year]   Date of admission: 04/27/2018   Date of discharge: 05/01/2018     Discharge Diagnoses:  Principal Problem:   Acute systolic congestive heart failure ([place]) Active Problems:   Diabetes mellitus type 2, uncontrolled, with complications (HCC)   GERD (gastroesophageal reflux disease)   Diabetic neuropathy ([place])   Stage 3 chronic kidney disease due to type 2 diabetes mellitus ([place])   Essential (primary) hypertension   Anemia   Acalculous cholecystitis   Tobacco abuse counseling   Acute systolic CHF (congestive heart failure) ([place])  Admitted From: home Disposition:  home  Recommendations for Outpatient Follow-up:  1. Please follow up with PCP in 1 week.  Follow-up Information    Antony Contras, MD. Go on 05/08/2018.   Specialty:  Family Medicine Why:  @ 12:15 pm Contact information: [place] [address] [phone]          Diet recommendation: Cardiac diet  Activity: The patient is advised to gradually reintroduce usual activities.  Discharge Condition: good  Code Status: Full code  History of present illness: As per the H and P dictated on admission, "Zoe Clarke is a 68 y.o. female with medical history significant of diabetes, asthma, hypertension, chronic kidney disease, gastric ulcer, and super morbid obesity who presents to the emergency department complaining of shortness of breath and wheezing for 3 days.  Used her inhaler with partial relief.  She has had intermittent middle sharp nonradiating chest pain which began last night.  She has no chest pain at the present time.  She has not had any recent travel, or recent surgery but does complain of lower extremity bilateral leg edema.  She states nothing makes her symptoms better or worse.  She denies using oxygen at home.  She was admitted on 04/10/2018 for nausea  and abdominal pain was diagnosed with a calculus cholecystitis and follow-up with surgery as an outpatient was recommended.  Surgery has requested a cardiology clearance before cholecystectomy.  Had intermittent sharp upper abdominal pain which began today she is she states the last time she ate was 1 AM this morning she had shrimp fried rice and an egg roll.  She has had nausea and one episode of vomiting in the emergency department.  She has had no diarrhea her last bowel movement was yesterday and was normal.  She has had a dry cough for 2 weeks.  She is had CT of the abdomen on 04/10/2018 that revealed changes suggestive of right-sided focal pyelonephritis, cholelithiasis without complicating factors and diverticulosis without diverticulitis. ED Course: Chest x-ray consistent with acute pulmonary edema did not improve significantly with a nebulizer treatment.  O2 sats dropped to 82% when oxygen removed.  Not been seen by cardiology yet for any evaluation.  Her BNP is elevated at 630.  EKG is reassuring and cardiac enzymes is negative.  Heart score is 4.  Patient will be admitted to the hospital for acute systolic congestive heart failure of new onset with associated hypoxemia."  Hospital Course:  Summary of her active problems in the hospital is as following.  Acute diastolic CHF -Clinically improving with diuresis, -Echo completed noted normal EF and wall motion -Bump in creatinine from 1.8 to 2.2, now stable. -Continue diuretic. -Started on Imdur, hydralazine on admission  Possible pneumonia -Repeat chest x-ray with right lower lobe infiltrate -Given low-grade temps, mild leukocytosis will  treat with 5-day course of Augmentin  Cholelithiasis, biliary sludge -Recent hospitalization reviewed, ultrasound does not note gallbladder wall edema, no abdominal pain nausea vomiting or right upper quadrant tenderness -Seen by general surgery last week, outpatient follow-up recommended  AKi on  CKD3 -Baseline creatinine around 1.8 -Worsened with diuresis to 2.2 range now stable.   Diabetes mellitus type 2, uncontrolled, with complications (HCC) -CBG stable, monitor  Polypharmacy, metabolic encephalopathy -Sluggish, drowsy in the setting of polypharmacy namely Percocet, gabapentin, Xanax, muscle relaxer -We will decrease gabapentin dose, discontinue muscle relaxer Xanax and Percocet  COPD/tobacco abuse -Counseled, stable, no wheezing, nebs as needed  GERD -Continue PPI  Chronic anemia -Likely due to CKD worsened in the setting of volume overloaded state/hemodilution -Monitor with diuresis   Patient was seen by physical therapy, who recommended home health which was arranged by case manager. On the day of the discharge the patient's vitals were stable , and no other acute medical condition were reported by patient. the patient was felt safe to be discharge at home with home health.  Consultants: none Procedures: none  DISCHARGE MEDICATION: Allergies as of 05/01/2018      Reactions   Aspirin Other (See Comments)   Feels funny    Allopurinol    Dizzy; hard to ambulate  Couldn't see; sweating   Amlodipine Other (See Comments)   headache   Lisinopril Swelling   Face swelling      Medication List    STOP taking these medications   cephALEXin 250 MG capsule Commonly known as:  KEFLEX   cloNIDine 0.2 MG tablet Commonly known as:  CATAPRES   oxyCODONE-acetaminophen 5-325 MG tablet Commonly known as:  PERCOCET/ROXICET     TAKE these medications   albuterol 108 (90 Base) MCG/ACT inhaler Commonly known as:  PROVENTIL HFA;VENTOLIN HFA Inhale 2 puffs into the lungs every 6 (six) hours as needed for wheezing or shortness of breath.   atorvastatin 40 MG tablet Commonly known as:  LIPITOR Take 40 mg by mouth daily.   carvedilol 3.125 MG tablet Commonly known as:  COREG Take 1 tablet (3.125 mg total) by mouth 2 (two) times daily with a meal.    cyclobenzaprine 5 MG tablet Commonly known as:  FLEXERIL Take 5 mg by mouth at bedtime.   docusate sodium 100 MG capsule Commonly known as:  COLACE Take 1 capsule (100 mg total) by mouth 2 (two) times daily.   fexofenadine 180 MG tablet Commonly known as:  ALLEGRA Take 180 mg by mouth daily.   furosemide 20 MG tablet Commonly known as:  LASIX Take 1 tablet (20 mg total) by mouth daily.   gabapentin 100 MG capsule Commonly known as:  NEURONTIN Take 1 capsule (100 mg total) by mouth at bedtime. What changed:    medication strength  how much to take  when to take this   glipiZIDE 10 MG tablet Commonly known as:  GLUCOTROL Take 1 tablet (10 mg total) by mouth 2 (two) times daily before a meal.   isosorbide-hydrALAZINE 20-37.5 MG tablet Commonly known as:  BIDIL Take 1 tablet by mouth 3 (three) times daily.   LINZESS 145 MCG Caps capsule Generic drug:  linaclotide Take 145 mcg by mouth as needed (constipation).   methocarbamol 500 MG tablet Commonly known as:  ROBAXIN Take 1 tablet (500 mg total) by mouth every 8 (eight) hours as needed for muscle spasms (back pain).   polyethylene glycol packet Commonly known as:  MIRALAX / GLYCOLAX Take 17 g  by mouth daily as needed for mild constipation.     ASK your doctor about these medications   amoxicillin-clavulanate 875-125 MG tablet Commonly known as:  AUGMENTIN Take 1 tablet by mouth 2 (two) times daily for 3 days. Ask about: Should I take this medication?      Allergies  Allergen Reactions  . Aspirin Other (See Comments)    Feels funny   . Allopurinol     Dizzy; hard to ambulate  Couldn't see; sweating  . Amlodipine Other (See Comments)    headache  . Lisinopril Swelling    Face swelling   Discharge Instructions    Ambulatory referral to Sleep Studies   Complete by:  As directed    Diet - low sodium heart healthy   Complete by:  As directed    Discharge instructions   Complete by:  As directed    It  is important that you read the given instructions as well as go over your medication list with RN to help you understand your care after this hospitalization.  Discharge Instructions: Please follow-up with PCP in 1-2 weeks  Please request your primary care physician to go over all Hospital Tests and Procedure/Radiological results at the follow up. Please get all Hospital records sent to your PCP by signing hospital release before you go home.   Do not take more than prescribed Pain, Sleep and Anxiety Medications. You were cared for by a hospitalist during your hospital stay. If you have any questions about your discharge medications or the care you received while you were in the hospital after you are discharged, you can call the unit @UNIT @ you were admitted to and ask to speak with the hospitalist on call if the hospitalist that took care of you is not available.  Once you are discharged, your primary care physician will handle any further medical issues. Please note that NO REFILLS for any discharge medications will be authorized once you are discharged, as it is imperative that you return to your primary care physician (or establish a relationship with a primary care physician if you do not have one) for your aftercare needs so that they can reassess your need for medications and monitor your lab values. You Must read complete instructions/literature along with all the possible adverse reactions/side effects for all the Medicines you take and that have been prescribed to you. Take any new Medicines after you have completely understood and accept all the possible adverse reactions/side effects. Wear Seat belts while driving. If you have smoked or chewed Tobacco in the last 2 yrs please stop smoking and/or stop any Recreational drug use.  If you drink alcohol, please moderate the use and do not drive, operating heavy machinery, perform activities at heights, swimming or participation in water  activities or provide baby sitting services under influence.   Increase activity slowly   Complete by:  As directed      Discharge Exam: Filed Weights   04/29/18 0222 04/30/18 0611 05/01/18 0300  Weight: 113.9 kg 112.4 kg 111.7 kg   Vitals:   04/30/18 1933 05/01/18 0300  BP: (!) 150/72 (!) 152/86  Pulse: 93 86  Resp: 18 18  Temp: 98.4 F (36.9 C) 98 F (36.7 C)  SpO2: 94% 96%   General: Appear in no distress, no Rash; Oral Mucosa moist. Cardiovascular: S1 and S2 Present, no Murmur, no JVD Respiratory: Bilateral Air entry present and Clear to Auscultation, no Crackles, no wheezes Abdomen: Bowel Sound present, Soft and  no tenderness Extremities: no Pedal edema, no calf tenderness Neurology: Grossly no focal neuro deficit.  The results of significant diagnostics from this hospitalization (including imaging, microbiology, ancillary and laboratory) are listed below for reference.    Significant Diagnostic Studies: Dg Chest 2 View  Result Date: 04/28/2018 CLINICAL DATA:  Shortness of breath with hypertension and congestive heart failure. Chronic bronchitis. EXAM: CHEST - 2 VIEW COMPARISON:  04/27/2018 FINDINGS: Lungs are adequately inflated with focal opacification over the right base slightly more confluent. Likely small amount layering posterior bilateral pleural fluid without significant change. Borderline stable cardiomegaly. Remainder of the exam is unchanged. IMPRESSION: Right base airspace opacification slightly more confluent and likely due to infection. Small amount of posterior layering pleural fluid bilaterally without significant change. Electronically Signed   By: Marin Olp M.D.   On: 04/28/2018 09:13   Dg Chest 2 View  Result Date: 04/27/2018 CLINICAL DATA:  Pt here with multiple complaints including abd pain chest pain and sob. Patient was dx with gallstones but also ate some fried foods at 1 am , pt did not take any of her meds this morning EXAM: CHEST - 2 VIEW  COMPARISON:  09/01/2017 FINDINGS: The heart is enlarged. There R interstitial changes of pulmonary edema. More focal opacity at the bases is consistent alveolar edema. There are bilateral pleural effusions. IMPRESSION: Findings consistent with congestive heart failure. Electronically Signed   By: Nolon Nations M.D.   On: 04/27/2018 10:12   Ct Abdomen Pelvis W Contrast  Result Date: 04/10/2018 CLINICAL DATA:  Right-sided abdominal pain for 2 days EXAM: CT ABDOMEN AND PELVIS WITH CONTRAST TECHNIQUE: Multidetector CT imaging of the abdomen and pelvis was performed using the standard protocol following bolus administration of intravenous contrast. CONTRAST:  41mL OMNIPAQUE 300 COMPARISON:  04/16/2009 FINDINGS: Lower chest: Lung bases demonstrate multiple calcified granulomas on the left. Minimal dependent atelectatic changes are noted. Hepatobiliary: Liver is well visualized and within normal limits. The gallbladder is well distended with dependent density consistent with multiple small gallstones. No ductal dilatation is seen. Pancreas: Unremarkable. No pancreatic ductal dilatation or surrounding inflammatory changes. Spleen: Normal in size without focal abnormality. Adrenals/Urinary Tract: Adrenal glands are within normal limits bilaterally. Kidneys are well visualized without focal mass lesion or hydronephrosis. Some decreased attenuation is noted in the upper pole of the right kidney posteriorly which may represent some focal pyelonephritis. No obstructive changes are noted. The bladder is well distended. Stomach/Bowel: Scattered diverticular change of the colon is noted. No findings to suggest diverticulitis are seen. The appendix is within normal limits. No small bowel or gastric abnormality is seen. Vascular/Lymphatic: Aortic atherosclerosis. No enlarged abdominal or pelvic lymph nodes. Reproductive: Uterus and bilateral adnexa are unremarkable. Other: No abdominal wall hernia or abnormality. No  abdominopelvic ascites. Musculoskeletal: Degenerative changes of lumbar spine are noted. IMPRESSION: Changes suggestive of right-sided focal pyelonephritis. Changes of prior granulomatous disease. Cholelithiasis without complicating factors. Diverticulosis without diverticulitis. Electronically Signed   By: Inez Catalina M.D.   On: 04/10/2018 11:10   Nm Hepato W/eject Fract  Result Date: 04/11/2018 CLINICAL DATA:  68 y/o F; right upper quadrant and right flank pain. EXAM: NUCLEAR MEDICINE HEPATOBILIARY IMAGING WITH GALLBLADDER EF TECHNIQUE: Sequential images of the abdomen were obtained out to 60 minutes following intravenous administration of radiopharmaceutical. After oral ingestion of Ensure, gallbladder ejection fraction was determined. At 60 min, normal ejection fraction is greater than 33%. RADIOPHARMACEUTICALS:  5.25 mCi Tc-50m  Choletec IV COMPARISON:  04/10/2018 abdominal ultrasound FINDINGS: Prompt  uptake and biliary excretion of activity by the liver is seen. Gallbladder activity is visualized, consistent with patency of cystic duct. Biliary activity passes into small bowel, consistent with patent common bile duct. Calculated gallbladder ejection fraction is 18%. (Normal gallbladder ejection fraction with Ensure is greater than 33%.) IMPRESSION: 1. Patent cystic and common bile ducts. 2. Low gallbladder ejection fraction of 18%. Electronically Signed   By: Kristine Garbe M.D.   On: 04/11/2018 16:56   US Abdomen Limited Ruq  Result Date: 04/27/2018 CLINICAL DATA:  68 year old female with history of right upper quadrant and epigastric abdominal pain for the past 2 days. History of gallstones. EXAM: ULTRASOUND ABDOMEN LIMITED RIGHT UPPER QUADRANT COMPARISON:  Abdominal ultrasound 04/10/2018. FINDINGS: Gallbladder: Amorphous faintly echogenic material lying dependently in the gallbladder without posterior acoustic shadowing, compatible with biliary sludge. No definite gallstones. Gallbladder  is normally distended. Gallbladder wall thickness is normal at 1.8 mm. No pericholecystic fluid. Per report from the sonographer, there was no sonographic Murphy's sign on examination. Common bile duct: Diameter: 6 mm in the porta hepatis. Liver: No focal lesion identified. Within normal limits in parenchymal echogenicity. Portal vein is patent on color Doppler imaging with normal direction of blood flow towards the liver. IMPRESSION: 1. Small amount of biliary sludge in the gallbladder, without evidence to suggest an acute cholecystitis at this time. Electronically Signed   By: Vinnie Langton M.D.   On: 04/27/2018 10:45   US Abdomen Limited Ruq  Result Date: 04/10/2018 CLINICAL DATA:  Right upper quadrant pain for a day. EXAM: ULTRASOUND ABDOMEN LIMITED RIGHT UPPER QUADRANT COMPARISON:  None. FINDINGS: Gallbladder: Numerous tiny stones and sludge in the gallbladder. No wall thickening, pericholecystic fluid, or Murphy's sign. Common bile duct: Diameter: 3.9 mm Liver: No focal lesion identified. Within normal limits in parenchymal echogenicity. Portal vein is patent on color Doppler imaging with normal direction of blood flow towards the liver. IMPRESSION: Numerous tiny stones and sludge layering in the gallbladder without wall thickening, pericholecystic fluid, or Murphy's sign. Electronically Signed   By: Dorise Bullion III M.D   On: 04/10/2018 14:37    Microbiology: Recent Results (from the past 240 hour(s))  Urine culture     Status: Abnormal   Collection Time: 04/27/18  1:34 PM  Result Value Ref Range Status   Specimen Description URINE, CLEAN CATCH  Final   Special Requests NONE  Final   Culture (A)  Final    <10,000 COLONIES/mL INSIGNIFICANT GROWTH Performed at [facility], [place] [address], [place], [place] [address]    Report Status 04/28/2018 FINAL  Final     Labs: CBC: Recent Labs  Lab 04/29/18 0511 04/30/18 0422 05/01/18 0537  WBC 11.0* 9.7 8.7  NEUTROABS  --   --   5.9  HGB 8.2* 8.5* 8.0*  HCT 26.9* 27.6* 26.0*  MCV 88.5 87.3 89.0  PLT 222 233 469   Basic Metabolic Panel: Recent Labs  Lab 04/29/18 0511 04/30/18 0422 05/01/18 0537  NA 141 142 141  K 4.8 4.9 5.0  CL 104 106 109  CO2 28 29 24   GLUCOSE 86 107* 129*  BUN 21 21 22   CREATININE 2.26* 2.22* 2.06*  CALCIUM 8.5* 8.3* 8.1*   Liver Function Tests: Recent Labs  Lab 04/30/18 0422 05/01/18 0537  AST 11* 14*  ALT 12 12  ALKPHOS 70 62  BILITOT 0.8 0.9  PROT 6.1* 5.6*  ALBUMIN 2.7* 2.5*   No results for input(s): LIPASE, AMYLASE in the last  168 hours. No results for input(s): AMMONIA in the last 168 hours. Cardiac Enzymes: No results for input(s): CKTOTAL, CKMB, CKMBINDEX, TROPONINI in the last 168 hours. BNP (last 3 results) Recent Labs    04/27/18 0940 04/28/18 0223  BNP 630.3* 451.4*   CBG: Recent Labs  Lab 04/30/18 1231 04/30/18 1700 04/30/18 2124 05/01/18 0732 05/01/18 1224  GLUCAP 119* 148* 126* 119* 130*   Time spent: 35 minutes  Signed:  Berle Mull  [facility] 05/01/2018

## 2018-05-05 NOTE — Patient Outreach (Addendum)
[name]) Care Management  05/05/2018  Zoe Clarke 1950/09/07 [id number]  EMMI: heart failure red alert Referral date: 05/05/18 Referral reason: weighed themselves today: no Insurance: [employer] advantage Day # 2  Telephone call to patient regarding EMMI heart failure red alert. HIPAA verified with patient. Explained reason for call. Patient states she has not weighed since discharge from the hospital   Patient states she has to purchase batteries for her scale. Patient states her husband will get batteries on Thursday 05/08/18.  RNCM discussed with patient importance of her weighing daily.  Discussed with patient possible purchase of batteries from the dollar store. Patient states either her husband or son will purchase for her. Patient states she was told she had fluid around her heart at this hospitalization. Patient states this is new for her.  Patient reports she has not been able to take all of her medications as prescribed. Patient reports she has been taking the AMOX-CLAV, furosemide, and methocarbamol together. Patient states this made her nauseated so she did not finish taking the AMOX-CLAV, and she stopped taking the furosemide and methocarbamol. Patient states she does not need to take th methocarbamol because she has cyclobenzaprine.   Patient reports she was sent home from the hospital using a wheelchair. Patient states she was using a walker prior to her hospitalization.  Patient states she is unsure why she was sent home with  A wheelchair but thinks its because she is a fall risk.  Patient states her last fall was November 2019.  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  Patient states she is not able to use the wheelchair to get out because it is manual. Patient states her husband is unable to push the wheelchair because he walks with a walker.  Patient states, " I need a ramp or  something."  She states she has 3 steps going into the house.  Patient states she thinks she needs to see a heart doctor. She states she has not been referred to a heart doctor in the past. She states she has had some family members pass away due to heart conditions.   Patient denies having a home health referral at discharge. Patient reports she uses [facility] for transportation.  Patient states she receives her medication from [place] who delivers.  RNCm discussed and offered [facility] care management services. Patient verbally agreed to services.   PLAN; RNCM will refer patient to community case manager  Quinn Plowman RN,BSN,CCM [facility] Telephonic  [phone]    PLAN:

## 2018-05-07 ENCOUNTER — Encounter: Payer: Self-pay | Admitting: *Deleted

## 2018-05-07 ENCOUNTER — Other Ambulatory Visit: Payer: Self-pay | Admitting: *Deleted

## 2018-05-07 NOTE — Patient Outreach (Signed)
[facility] [place] [facility]) Care Management  05/07/2018  Zoe Clarke Jan 15, 1951 [id number]   Transition of care contact  RN spoke with pt today and introduced [facility] services and the purpose for today's call. Identifiers verified and pt receptive to the call. RN further inquired on her medical conditions with the focus on pt's newly diagnosis of CHF. Pt states she is very limited with HF education and not really sure what to expect. RN educated pt on HF and HF zones verifying pt is in the GREEN zone. Discussed the importance of monitoring her weight related to 3 lbs overnight or 5 lbs within one week of fluid can result in exacerbated symptoms. Discussed fluid retention and the importance of prevention measures such as weighing daily and examining her body for fluid retention to her feet/ankles, hands or in her trunk area making it difficult to breathing (SOB). Pt indicates her spouse will obtain batteries tomorrow and she will start weighing daily on her home scales. RN discussed if there is any swellings of extremeities, SOB or coughing experienced to contact her provided immediately along with any weight gained as these maybe early signs or HF. RN stress the risk involved if she does not monitor her HF that can lead once again to readmission. Pt verbalized an understanding and will contact her provider if she recognized any precipitating symptoms. The initial assessment was completed and a plan of care was introduced to the pt for ongoing case management services with [facility]. Pt receptive and opt to continue case management services with transition of care contacts along with telephonically case maagement instead of completed home visits. RN will discuss goals of care, interventions and stress the importance of adhering to the plan as she has agreed to actively participate. Pt aware that her provider Dr. Moreen Clarke will be updated on her disposition with [facility] program and services. Pt has also verified she  will follow up with her post hospital visit with her primary provider this Friday.   RN also introduced other disciplines with [facility] for pharmacy and social worker with no needs presented at this time for referrals. Will verify pt has sufficient transportation source ([employer]) along with a supportive caregiverher spouse Zoe Clarke) if needed. No other inquires or request at this time as RN will follow up next week with ongoing case management services related to HF management. RN has agreed to sent pt a [facility] calendar for ongoing HF monitoring. The following  plan of care with goals and interventions generated for pt's participation in managing her HF.   THN CM Care Plan Problem One     Most Recent Value  Care Plan Problem One  Hospital prevention related to HF   Role Documenting the Problem One  Care Management [place] for Problem One  Active  THN Long Term Goal   Pt will not have a readmission related to HF over the next 90 days.  THN Long Term Goal Start Date  05/07/18  Interventions for Problem One Long Term Goal  Will educate on the importance of hospital prevention measures and HF zones related to what to fo if acute symptoms are encountered. Will also verify pt is in the GREEN once educated.   THN CM Short Term Goal #1   Pt will complete daily weights over the next 30 days   THN CM Short Term Goal #1 Start Date  05/07/18  Interventions for Short Term Goal #1  Will discuss the importance of  daily weights related  to fluid retention and how this affects HF exacerbation  THN CM Short Term Goal #2   Adherence to medications post hospitalization within the next 30 days.  THN CM Short Term Goal #2 Start Date  05/07/18  Interventions for Short Term Goal #2  Will review all medications and verified pt understanding. Will education pt specially on her HF medications and educate on the purpose of taking all prescribed medications.      Zoe Mina, RN Care Management Coordinator [place] Office [phone]

## 2018-05-09 ENCOUNTER — Other Ambulatory Visit: Payer: Self-pay | Admitting: *Deleted

## 2018-05-09 DIAGNOSIS — E7800 Pure hypercholesterolemia, unspecified: Secondary | ICD-10-CM | POA: Diagnosis not present

## 2018-05-09 DIAGNOSIS — K838 Other specified diseases of biliary tract: Secondary | ICD-10-CM | POA: Diagnosis not present

## 2018-05-09 DIAGNOSIS — R5381 Other malaise: Secondary | ICD-10-CM | POA: Diagnosis not present

## 2018-05-09 DIAGNOSIS — N183 Chronic kidney disease, stage 3 (moderate): Secondary | ICD-10-CM | POA: Diagnosis not present

## 2018-05-09 DIAGNOSIS — I5022 Chronic systolic (congestive) heart failure: Secondary | ICD-10-CM | POA: Diagnosis not present

## 2018-05-09 DIAGNOSIS — I10 Essential (primary) hypertension: Secondary | ICD-10-CM | POA: Diagnosis not present

## 2018-05-09 DIAGNOSIS — K219 Gastro-esophageal reflux disease without esophagitis: Secondary | ICD-10-CM | POA: Diagnosis not present

## 2018-05-09 DIAGNOSIS — E1169 Type 2 diabetes mellitus with other specified complication: Secondary | ICD-10-CM | POA: Diagnosis not present

## 2018-05-09 DIAGNOSIS — M25552 Pain in left hip: Secondary | ICD-10-CM | POA: Diagnosis not present

## 2018-05-09 DIAGNOSIS — D649 Anemia, unspecified: Secondary | ICD-10-CM | POA: Diagnosis not present

## 2018-05-09 NOTE — Patient Outreach (Signed)
[place] [facility]) Care Management  05/09/2018  Zoe Clarke 1950-11-03 [id number]    Received EMMI RED HF (unsuccessful)  RN attempted outreach call today however pt not available and RN unable to leave a HIPAA approved voice message. EMMI indicated pt did not weight however pt reported on last telephone contact that she would obtain batteries for her scales and start weighing on yesterday. RN will follow up next week once again an inquired on pt's management of care.   Raina Mina, RN Care Management Coordinator [place] Office [phone]

## 2018-05-14 ENCOUNTER — Other Ambulatory Visit: Payer: Self-pay | Admitting: *Deleted

## 2018-05-14 NOTE — Patient Outreach (Signed)
[place] [facility]) Care Management  05/14/2018  Bert Givans [birth year] [id number]    RN attempted outreach call today for transition of care however no answer and unable to leave a message. Will reschedule another call for ongoing services. Note this is the second outreach with no response. Will send outreach letter for ongoing services via [facility] case management services.  Raina Mina, RN Care Management Coordinator [place] Office [phone]

## 2018-05-16 ENCOUNTER — Other Ambulatory Visit: Payer: Self-pay | Admitting: *Deleted

## 2018-05-16 NOTE — Patient Outreach (Signed)
[place] [facility]) Care Management  05/16/2018  Kaydon Creedon [birth year] [id number]    Outreach attempt to patient. No answer and unable to leave voicemail message. RN CM has already sent unsuccessful outreach letter to patient and will close case if no response from patient in a few days.  Raina Mina, RN Care Management Coordinator [place] Office [phone]

## 2018-05-22 ENCOUNTER — Other Ambulatory Visit: Payer: Self-pay | Admitting: *Deleted

## 2018-05-22 NOTE — Patient Outreach (Signed)
[place]) Care Management  05/22/2018  Zoe Clarke 13-Nov-1950 [id number]    Received referral for EMMI RED and case closure due to no contact.  RN attempted outreach attempt remains unsuccessful. RN able to leave a HIPAA approved voice message requesting a call back. Several attempt to contact this pt and no response to call or outreach letter sent to this pt. Will closed this case as non-active due to no recent contacts and notify pt's provider of pt's disposition with community case management services with [facility]. Will also alert staff (CMA) of pt discharged.  Raina Mina, RN Care Management Coordinator [place] Office [phone]

## 2018-05-26 ENCOUNTER — Other Ambulatory Visit: Payer: Self-pay

## 2018-05-26 NOTE — Patient Outreach (Signed)
[place] [facility]) Care Management  05/26/2018  Gayleen Sholtz Oct 10, 1950 [id number]    EMMI-HF RED ON EMMI ALERT Day # 22 Date: 05/23/2018 Red Alert Reason: " Know why to take meds?No"   Outreach attempt # 1 to patient. Spoke with patient who voices things are going well She denies any acute issues or concerns at this time. Reviewed and addressed red alert with patient. She denies responding that way. She voices no issues or concerns regarding her meds. Patient confirmed that she has all her meds in the home and know when and how to take them. She reports weight is stable at 238 lbs. She denies any swelling and edema. She voices she is weighing daily as instructed. She voices no further RN CM needs or concerns at this time. Advised patient that they would continue to get automated EMMI- HF post discharge calls to assess how they are doing following recent hospitalization and will receive a call from a nurse if any of their responses were abnormal. Patient voiced understanding and was appreciative of f/u call.        Plan: RN CM will close case as no further interventions needed at this time.  Enzo Montgomery, RN,BSN,CCM [place] Management Telephonic Care Management Coordinator Direct Phone: [phone] Toll Free: [phone] Fax: [phone]

## 2018-05-27 DIAGNOSIS — E118 Type 2 diabetes mellitus with unspecified complications: Secondary | ICD-10-CM | POA: Diagnosis not present

## 2018-05-27 DIAGNOSIS — I10 Essential (primary) hypertension: Secondary | ICD-10-CM | POA: Diagnosis not present

## 2018-05-27 DIAGNOSIS — I509 Heart failure, unspecified: Secondary | ICD-10-CM | POA: Diagnosis not present

## 2018-05-27 DIAGNOSIS — E785 Hyperlipidemia, unspecified: Secondary | ICD-10-CM | POA: Diagnosis not present

## 2018-05-27 DIAGNOSIS — Z6841 Body Mass Index (BMI) 40.0 and over, adult: Secondary | ICD-10-CM | POA: Diagnosis not present

## 2018-05-27 DIAGNOSIS — E1165 Type 2 diabetes mellitus with hyperglycemia: Secondary | ICD-10-CM | POA: Diagnosis not present

## 2018-05-27 DIAGNOSIS — N183 Chronic kidney disease, stage 3 (moderate): Secondary | ICD-10-CM | POA: Diagnosis not present

## 2018-05-27 DIAGNOSIS — Z720 Tobacco use: Secondary | ICD-10-CM | POA: Diagnosis not present

## 2018-05-28 ENCOUNTER — Other Ambulatory Visit: Payer: Self-pay

## 2018-05-28 NOTE — Patient Outreach (Signed)
[place] [facility]) Care Management  05/28/2018  Ercel Normoyle Dec 03, 1950 [id number]    EMMI-HF RED ON EMMI ALERT Day # 26 Date: 05/27/2018 Red Alert Reason: " Weight? 243lbs"   Outreach attempt # 1 to patient. No answer at present. RN CM left HIPAA compliant voicemail message along with contact info.      Plan: RN CM will make outreach attempt to patient within 3-4 business days. RN CM will send unsuccessful outreach letter to patient.  Enzo Montgomery, RN,BSN,CCM [place] Management Telephonic Care Management Coordinator Direct Phone: [phone] Toll Free: [phone] Fax: [phone]

## 2018-05-28 NOTE — Patient Outreach (Signed)
[place] [facility]) Care Management  05/28/2018  Latandra Loureiro [birth year] [id number]   EMMI-HF RED ON EMMI ALERT Day # 26 Date: 05/27/2018 Red Alert Reason: " Weight? 243lbs"    Incoming call from patient returning RN CM call. Patient voices that she went to MD appt on yesterday and weighed 243 lbs. She states she recorded that as her weight. She states that she feels like maybe her home scale has been off or she has not been weighing properly. RN CM reviewed with patient proper weighing techniques. She states weight 243 lbs this am. She denies any SOB, edema or other symptoms. She voices that MD was not too concerned about weight yesterday but just advised patient to keep montioring and let them know if it worsens. Patient states that she did eat some of the wrong foods the other day. She states she had some spaghetti and white potatoes. She knows this was wrong for her sodium and sugar intake. She reports that she gets MOWs as well. RN CM educated patient on proper diet and restrictions. Patient aware to seek medical attention for any worsening and/or unresolved issues. She denies any further RN CM needs or concerns at this time.    Plan: RN CM will close case as no further interventions needed at this time.   Enzo Montgomery, RN,BSN,CCM [place] Management Telephonic Care Management Coordinator Direct Phone: [phone] Toll Free: [phone] Fax: [phone]

## 2018-05-29 ENCOUNTER — Other Ambulatory Visit: Payer: Self-pay

## 2018-05-29 NOTE — Patient Outreach (Signed)
[place] [facility]) Care Management  05/29/2018  Ivylynn Hoppes May 10, 1950 [id number]    EMMI-HF RED ON EMMI ALERT Day # 27 Date: 05/28/2018 Red Alert Reason: 'Weight: 243 lbs"   Red on EMMI alert received. No outreach warranted at this time. RN CM addressed issue on previous call.      Plan: RN CM will close case at this time.  Enzo Montgomery, RN,BSN,CCM [place] Management Telephonic Care Management Coordinator Direct Phone: [phone] Toll Free: [phone] Fax: [phone]

## 2018-05-30 ENCOUNTER — Other Ambulatory Visit: Payer: Self-pay

## 2018-05-30 DIAGNOSIS — I5021 Acute systolic (congestive) heart failure: Secondary | ICD-10-CM | POA: Diagnosis not present

## 2018-05-30 DIAGNOSIS — M25561 Pain in right knee: Secondary | ICD-10-CM | POA: Diagnosis not present

## 2018-05-30 DIAGNOSIS — Z96659 Presence of unspecified artificial knee joint: Secondary | ICD-10-CM | POA: Diagnosis not present

## 2018-05-30 DIAGNOSIS — Z9660 Presence of unspecified orthopedic joint implant: Secondary | ICD-10-CM | POA: Diagnosis not present

## 2018-05-30 NOTE — Patient Outreach (Signed)
[place] [facility]) Care Management  05/30/2018  Antanette Richwine April 03, 1950 [id number]    EMMI- HF RED ON EMMI ALERT Day # 28 Date: 05/29/2018 Red Alert Reason: "Weight: 243 lbs"    Red on EMMI dashboard received. No outreach call warranted to patient at this time. RN CM addressed issue on previous call.      Plan: RN CM will close case at this time.  Enzo Montgomery, RN,BSN,CCM [place] Management Telephonic Care Management Coordinator Direct Phone: [phone] Toll Free: [phone] Fax: [phone]

## 2018-06-03 ENCOUNTER — Other Ambulatory Visit: Payer: Self-pay

## 2018-06-03 NOTE — Patient Outreach (Signed)
  [facility] [facility]) Care Management  06/03/2018  Zoe Clarke 04/08/1950 [id number]    EMMI-HF RED ON EMMI ALERT Day # 32 Date: 06/02/2018 Red Alert Reason: " Any new problems? Yes  New/worsening problems? Yes  Other symptoms/problems? Yes"    Voicemail message received from pt. Return call placed to patient. Spoke with patient who voices she is doing well. RN CM reviewed and addressed red alerts. Patient reports she was having some constipation. She took a laxative and had a good BM. She voices that was her main symptom. She states she is feeling good today and has no other issues or concerns at this time. She reports that weight was back down to 238 lbs. She denies any edema,SOB and/or other symptoms. She is adhering to med regimen. Patient aware that she will continue to get automated calls for EMMI-HF.     Plan: RN CM will close case as no further interventions needed at this time.  Enzo Montgomery, RN,BSN,CCM [place] Management Telephonic Care Management Coordinator Direct Phone: [phone] Toll Free: [phone] Fax: [phone]

## 2018-06-03 NOTE — Patient Outreach (Signed)
[place] [facility]) Care Management  06/03/2018  Zoe Clarke [birth year] [id number]     EMMI-HF RED ON EMMI ALERT Day # 32 Date: 06/02/2018 Red Alert Reason: " Any new problems? Yes  New/worsening problems? Yes  Other symptoms/problems? Yes"   Outreach attempt # 1 to patient. No answer. RN CM left HIPAA compliant voicemail message along with contact info.    Plan: RN CM will make outreach attempt to patient within 3-4 business days. RN CM will send unsuccessful outreach letter to patient.  Enzo Montgomery, RN,BSN,CCM [place] Management Telephonic Care Management Coordinator Direct Phone: [phone] Toll Free: [phone] Fax: [phone]

## 2018-06-30 DIAGNOSIS — M25561 Pain in right knee: Secondary | ICD-10-CM | POA: Diagnosis not present

## 2018-06-30 DIAGNOSIS — I5021 Acute systolic (congestive) heart failure: Secondary | ICD-10-CM | POA: Diagnosis not present

## 2018-06-30 DIAGNOSIS — Z9660 Presence of unspecified orthopedic joint implant: Secondary | ICD-10-CM | POA: Diagnosis not present

## 2018-06-30 DIAGNOSIS — Z96659 Presence of unspecified artificial knee joint: Secondary | ICD-10-CM | POA: Diagnosis not present

## 2018-07-11 ENCOUNTER — Telehealth: Payer: Self-pay | Admitting: Cardiovascular Disease

## 2018-07-11 NOTE — Telephone Encounter (Signed)
Was speaking to pt re: her husbands appt with Kerin Ransom PA on 07/15/18 and she asked to make her APPT with Dr. Oval Linsey 07/14/18 VIRTUAL... she has had recent EKG and other tests..  She consents to a Virtual smartphone visit. We went over the instructions and she verbalized understanding.      Virtual Visit Pre-Appointment Phone Call  1. "What is the BEST phone number to call the day of the visit?" [phone]  2. "Do you have or have access to (through a family member/friend) a smartphone with video capability that we can use for your visit? YES  3. Confirm consent - "In the setting of the current Covid19 crisis, you are scheduled for a (phone or video) visit with your provider on 07/14/18 at 10:40am.  Just as we do with many in-office visits, in order for you to participate in this visit, we must obtain consent.  If you'd like, I can send this to your mychart (if signed up) or email for you to review.  Otherwise, I can obtain your verbal consent now.  All virtual visits are billed to your insurance company just like a normal visit would be.  By agreeing to a virtual visit, we'd like you to understand that the technology does not allow for your provider to perform an examination, and thus may limit your provider's ability to fully assess your condition. If your provider identifies any concerns that need to be evaluated in person, we will make arrangements to do so.  Finally, though the technology is pretty good, we cannot assure that it will always work on either your or our end, and in the setting of a video visit, we may have to convert it to a phone-only visit.  In either situation, we cannot ensure that we have a secure connection.  Are you willing to proceed?" STAFF: Did the patient verbally acknowledge consent to telehealth visit? Document YES/NO here: YES  4. Advise patient to be prepared - "Two hours prior to your appointment, go ahead and check your blood pressure, pulse, oxygen saturation,  and your weight (if you have the equipment to check those) and write them all down. When your visit starts, your provider will ask you for this information. If you have an Apple Watch or Kardia device, please plan to have heart rate information ready on the day of your appointment. Please have a pen and paper handy nearby the day of the visit as well."  5. Give patient instructions for MyChart download to smartphone OR Doximity/Doxy.me as below if video visit (depending on what platform provider is using)  6. Inform patient they will receive a phone call 15 minutes prior to their appointment time (may be from unknown caller ID) so they should be prepared to answer    TELEPHONE CALL NOTE  Zoe Clarke has been deemed a candidate for a follow-up tele-health visit to limit community exposure during the Covid-19 pandemic. I spoke with the patient via phone to ensure availability of phone/video source, confirm preferred email & phone number, and discuss instructions and expectations.  I reminded Zoe Clarke to be prepared with any vital sign and/or heart rhythm information that could potentially be obtained via home monitoring, at the time of her visit. I reminded Zoe Clarke to expect a phone call prior to her visit.  Stephani Police, RN 07/11/2018 9:12 AM   IF USING DOXIMITY or DOXY.ME - The patient will receive a link just prior to their visit by  text.     FULL LENGTH CONSENT FOR TELE-HEALTH VISIT   I hereby voluntarily request, consent and authorize [facility] and its employed or contracted physicians, physician assistants, nurse practitioners or other licensed health care professionals (the Practitioner), to provide me with telemedicine health care services (the "Services") as deemed necessary by the treating Practitioner. I acknowledge and consent to receive the Services by the Practitioner via telemedicine. I understand that the telemedicine visit will involve  communicating with the Practitioner through live audiovisual communication technology and the disclosure of certain medical information by electronic transmission. I acknowledge that I have been given the opportunity to request an in-person assessment or other available alternative prior to the telemedicine visit and am voluntarily participating in the telemedicine visit.  I understand that I have the right to withhold or withdraw my consent to the use of telemedicine in the course of my care at any time, without affecting my right to future care or treatment, and that the Practitioner or I may terminate the telemedicine visit at any time. I understand that I have the right to inspect all information obtained and/or recorded in the course of the telemedicine visit and may receive copies of available information for a reasonable fee.  I understand that some of the potential risks of receiving the Services via telemedicine include:  Marland Kitchen Delay or interruption in medical evaluation due to technological equipment failure or disruption; . Information transmitted may not be sufficient (e.g. poor resolution of images) to allow for appropriate medical decision making by the Practitioner; and/or  . In rare instances, security protocols could fail, causing a breach of personal health information.  Furthermore, I acknowledge that it is my responsibility to provide information about my medical history, conditions and care that is complete and accurate to the best of my ability. I acknowledge that Practitioner's advice, recommendations, and/or decision may be based on factors not within their control, such as incomplete or inaccurate data provided by me or distortions of diagnostic images or specimens that may result from electronic transmissions. I understand that the practice of medicine is not an exact science and that Practitioner makes no warranties or guarantees regarding treatment outcomes. I acknowledge that I will  receive a copy of this consent concurrently upon execution via email to the email address I last provided but may also request a printed copy by calling the office of [place].    I understand that my insurance will be billed for this visit.   I have read or had this consent read to me. . I understand the contents of this consent, which adequately explains the benefits and risks of the Services being provided via telemedicine.  . I have been provided ample opportunity to ask questions regarding this consent and the Services and have had my questions answered to my satisfaction. . I give my informed consent for the services to be provided through the use of telemedicine in my medical care  By participating in this telemedicine visit I agree to the above. PT gave verbal CONSENT.

## 2018-07-11 NOTE — Telephone Encounter (Signed)
New Message:    Patient wife calling concering his appt for next week. Please give patient a call.

## 2018-07-14 ENCOUNTER — Encounter: Payer: Self-pay | Admitting: Cardiovascular Disease

## 2018-07-14 ENCOUNTER — Telehealth (INDEPENDENT_AMBULATORY_CARE_PROVIDER_SITE_OTHER): Payer: PPO | Admitting: Cardiovascular Disease

## 2018-07-14 ENCOUNTER — Other Ambulatory Visit: Payer: Self-pay

## 2018-07-14 VITALS — Ht 68.0 in

## 2018-07-14 DIAGNOSIS — I10 Essential (primary) hypertension: Secondary | ICD-10-CM

## 2018-07-14 DIAGNOSIS — I110 Hypertensive heart disease with heart failure: Secondary | ICD-10-CM

## 2018-07-14 DIAGNOSIS — I5032 Chronic diastolic (congestive) heart failure: Secondary | ICD-10-CM | POA: Diagnosis not present

## 2018-07-14 DIAGNOSIS — G4733 Obstructive sleep apnea (adult) (pediatric): Secondary | ICD-10-CM

## 2018-07-14 DIAGNOSIS — Z01818 Encounter for other preprocedural examination: Secondary | ICD-10-CM

## 2018-07-14 DIAGNOSIS — E7800 Pure hypercholesterolemia, unspecified: Secondary | ICD-10-CM

## 2018-07-14 DIAGNOSIS — R0683 Snoring: Secondary | ICD-10-CM

## 2018-07-14 MED ORDER — PANTOPRAZOLE SODIUM 40 MG PO TBEC: 40.0000 mg | DELAYED_RELEASE_TABLET | Freq: Every day | ORAL | 11 refills | Status: DC

## 2018-07-14 NOTE — Patient Instructions (Addendum)
Medication Instructions:  START PANTOPRAZOLE 40 MG DAILY   If you need a refill on your cardiac medications before your next appointment, please call your pharmacy.   Lab work: NONE  Testing/Procedures: Your physician has recommended that you have a sleep study. This test records several body functions during sleep, including: brain activity, eye movement, oxygen and carbon dioxide blood levels, heart rate and rhythm, breathing rate and rhythm, the flow of air through your mouth and nose, snoring, body muscle movements, and chest and belly movement.  Your physician has requested that you have a lexiscan myoview. For further information please visit [email or web address]. Please follow instruction sheet, as given.  CALL AFTER YOU MEET WITH SURGEON ON MAY 11 TO SCHEDULE   Follow-Up: Your physician recommends that you schedule a follow-up appointment in: [facility] ON 08/12/18 AT 1:20   Any Other Special Instructions Will Be Listed Below (If Applicable).  MONITOR AND LOG YOUR BLOOD PRESSURE AT HOME   Sleep Studies A sleep study (polysomnogram) is a series of tests done while you are sleeping. A sleep study records your brain waves, heart rate, breathing rate, oxygen level, and eye and leg movements. A sleep study helps your health care provider:  See how well you sleep.  Diagnose a sleep disorder.  Determine how severe your sleep disorder is.  Create a plan to treat your sleep disorder. Your health care provider may recommend a sleep study if you:  Feel sleepy on most days.  Snore loudly while sleeping.  Have unusual behaviors while you sleep, such as walking.  Have brief periods in which you stop breathing during sleep (sleepapnea).  Fall asleep suddenly during the day (narcolepsy).  Have trouble falling asleep or staying asleep (insomnia).  Feel like you need to move your legs when trying to fall asleep (restless legs syndrome).  Move your legs by flexing and extending  them regularly while asleep (periodic limb movement disorder).  Act out your dreams while you sleep (sleep behavior disorder).  Feel like you cannot move when you first wake up (sleep paralysis). What tests are part of a sleep study? Most sleep studies record the following during sleep:  Brain activity.  Eye movements.  Heart rate and rhythm.  Breathing rate and rhythm.  Blood-oxygen level.  Blood pressure.  Chest and belly movement as you breathe.  Arm and leg movements.  Snoring or other noises.  Body position. Where are sleep studies done? Sleep studies are done at sleep centers. A sleep center may be inside a hospital, office, or clinic. The room where you have the study may look like a hospital room or a hotel room. The health care providers doing the study may come in and out of the room during the study. Most of the time, they will be in another room monitoring your test as you sleep. How are sleep studies done? Most sleep studies are done during a normal period of time for a full night of sleep. You will arrive at the study center in the evening and go home in the morning. Before the test  Bring your pajamas and toothbrush with you to the sleep study.  Do not have caffeine on the day of your sleep study.  Do not drink alcohol on the day of your sleep study.  Your health care provider will let you know if you should stop taking any of your regular medicines before the test. During the test      Round, sticky patches with sensors  attached to recording wires (electrodes) are placed on your scalp, face, chest, and limbs.  Wires from all the electrodes and sensors run from your bed to a computer. The wires can be taken off and put back on if you need to get out of bed to go to the bathroom.  A sensor is placed over your nose to measure airflow.  A finger clip is put on your finger or ear to measure your blood oxygen level (pulse oximetry).  A belt is placed  around your belly and a belt is placed around your chest to measure breathing movements.  If you have signs of the sleep disorder called sleep apnea during your test, you may get a treatment mask to wear for the second half of the night. ? The mask provides positive airway pressure (PAP) to help you breathe better during sleep. This may greatly improve your sleep apnea. ? You will then have all tests done again with the mask in place to see if your measurements and recordings change. After the test  A medical doctor who specializes in sleep will evaluate the results of your sleep study and share them with you and your primary health care provider.  Based on your results, your medical history, and a physical exam, you may be diagnosed with a sleep disorder, such as: ? Sleep apnea. ? Restless legs syndrome. ? Sleep-related behavior disorder. ? Sleep-related movement disorders. ? Sleep-related seizure disorders.  Your health care team will help determine your treatment options based on your diagnosis. This may include: ? Improving your sleep habits (sleep hygiene). ? Wearing a continuous positive airway pressure (CPAP) or bi-level positive airway pressure (BPAP) mask. ? Wearing an oral device at night to improve breathing and reduce snoring. ? Taking medicines. Follow these instructions at home:  Take over-the-counter and prescription medicines only as told by your health care provider.  If you are instructed to use a CPAP or BPAP mask, make sure you use it nightly as directed.  Make any lifestyle changes that your health care provider recommends.  If you were given a device to open your airway while you sleep, use it only as told by your health care provider.  Do not use any tobacco products, such as cigarettes, chewing tobacco, and e-cigarettes. If you need help quitting, ask your health care provider.  Keep all follow-up visits as told by your health care provider. This is  important. Summary  A sleep study (polysomnogram) is a series of tests done while you are sleeping. It shows how well you sleep.  Most sleep studies are done over one full night of sleep. You will arrive at the study center in the evening and go home in the morning.  If you have signs of the sleep disorder called sleep apnea during your test, you may get a treatment mask to wear for the second half of the night.  A medical doctor who specializes in sleep will evaluate the results of your sleep study and share them with your primary health care provider. This information is not intended to replace advice given to you by your health care provider. Make sure you discuss any questions you have with your health care provider. Document Released: 09/09/2002 Document Revised: 04/02/2017 Document Reviewed: 04/02/2017 Elsevier Interactive Patient Education  2019 [employer].     Cardiac Nuclear Scan A cardiac nuclear scan is a test that is done to check the flow of blood to your heart. It is done when you  are resting and when you are exercising. The test looks for problems such as: Not enough blood reaching a portion of the heart. The heart muscle not working as it should. You may need this test if: You have heart disease. You have had lab results that are not normal. You have had heart surgery or a balloon procedure to open up blocked arteries (angioplasty). You have chest pain. You have shortness of breath. In this test, a special dye (tracer) is put into your bloodstream. The tracer will travel to your heart. A camera will then take pictures of your heart to see how the tracer moves through your heart. This test is usually done at a hospital and takes 2-4 hours. Tell a doctor about: Any allergies you have. All medicines you are taking, including vitamins, herbs, eye drops, creams, and over-the-counter medicines. Any problems you or family members have had with anesthetic medicines. Any  blood disorders you have. Any surgeries you have had. Any medical conditions you have. Whether you are pregnant or may be pregnant. What are the risks? Generally, this is a safe test. However, problems may occur, such as: Serious chest pain and heart attack. This is only a risk if the stress portion of the test is done. Rapid heartbeat. A feeling of warmth in your chest. This feeling usually does not last long. Allergic reaction to the tracer. What happens before the test? Ask your doctor about changing or stopping your normal medicines. This is important. Follow instructions from your doctor about what you cannot eat or drink. Remove your jewelry on the day of the test. What happens during the test? An IV tube will be inserted into one of your veins. Your doctor will give you a small amount of tracer through the IV tube. You will wait for 20-40 minutes while the tracer moves through your bloodstream. Your heart will be monitored with an electrocardiogram (ECG). You will lie down on an exam table. Pictures of your heart will be taken for about 15-20 minutes. You may also have a stress test. For this test, one of these things may be done: You will be asked to exercise on a treadmill or a stationary bike. You will be given medicines that will make your heart work harder. This is done if you are unable to exercise. When blood flow to your heart has peaked, a tracer will again be given through the IV tube. After 20-40 minutes, you will get back on the exam table. More pictures will be taken of your heart. Depending on the tracer that is used, more pictures may need to be taken 3-4 hours later. Your IV tube will be removed when the test is over. The test may vary among doctors and hospitals. What happens after the test? Ask your doctor: Whether you can return to your normal schedule, including diet, activities, and medicines. Whether you should drink more fluids. This will help to remove  the tracer from your body. Drink enough fluid to keep your pee (urine) pale yellow. Ask your doctor, or the department that is doing the test: When will my results be ready? How will I get my results? Summary A cardiac nuclear scan is a test that is done to check the flow of blood to your heart. Tell your doctor whether you are pregnant or may be pregnant. Before the test, ask your doctor about changing or stopping your normal medicines. This is important. Ask your doctor whether you can return to your normal activities.  You may be asked to drink more fluids. This information is not intended to replace advice given to you by your health care provider. Make sure you discuss any questions you have with your health care provider. Document Released: 08/19/2017 Document Revised: 08/19/2017 Document Reviewed: 08/19/2017 Elsevier Interactive Patient Education  2019 [employer].

## 2018-07-14 NOTE — Progress Notes (Signed)
Virtual Visit via Video Note   This visit type was conducted due to national recommendations for restrictions regarding the COVID-19 Pandemic (e.g. social distancing) in an effort to limit this patient's exposure and mitigate transmission in our community.  Due to her co-morbid illnesses, this patient is at least at moderate risk for complications without adequate follow up.  This format is felt to be most appropriate for this patient at this time.  All issues noted in this document were discussed and addressed.  A limited physical exam was performed with this format.  Please refer to the patient's chart for her consent to telehealth for [facility].   Evaluation Performed:  Follow-up visit  Date:  07/15/2018   ID:  Zoe Clarke, Zoe Clarke 1950/04/25, MRN [id number]  Patient Location: Home Provider Location: Office  PCP:  Antony Contras, MD  Cardiologist:  Skeet Latch, MD  Electrophysiologist:  None   Chief Complaint: heart failure  History of Present Illness:    Zoe Clarke Patient is a 68 y.o. female with chronic diastolic heart failure, diabetes, hypertension CKD 3, asthma, and here today due to obesity and heart failure.  She was admitted 03/2018 with Nausea and abdominal pain at which time she was found to have calculus cholecystitis and outpatient follow-up with surgery was recommended.  Surgery recommended cardiology clearance prior to cholecystectomy.  She had a CT of the abdomen 04/10/2018 that revealed changes suggestive of right-sided focal pyelonephritis with cholelithiasis.  She was then admitted 04/2018 with chest pain.  In the ED she was noted to have pulmonary edema on chest x-ray and there was no improvement with nebulizer treatments.  Oxygen saturation was 82% on room air.  BNP was 630.  Cardiac enzymes were negative.  She had an echo that admission that revealed LVEF 60 to 65% and grade 1 diastolic dysfunction.  Her echo was otherwise unremarkable.  RA pressure was 8  mmHg.  Symptoms improved with diuresis despite acute on chronic renal failure (creatinine increased from 1.8 to 2.2).  She was started on Imdur and hydralazine. CXR was concerning for RLL infiltrate and she was treated for CAP.  During hospitalization she was noted to have metabolic encephalopathy in the setting of polypharmacy.  Gabapentin was reduced and muscle relaxer, Xanax and Percocet were discontinued.  However she continues to take these medications.  Zoe Clarke reports that she has been feeling well.  She hasn't had any issues with her breathing and didn't feel short of breath prior to her hospitalization.  She has not had any chest pain, orthopnea, or PND.   She reports swelling in the R ankle for the last day.  She attributes that to doing a lot of walking lately and has a history of a hairline fracture.  She walks with a walker due to gait instability.  She was diagnosed with diabetes in 2003.  She has had hypertension all of her adult life.  She had a colonoscopy 10/2015.  The following day she had an episode of syncope in the setting of taking her BP medication during bowel prep.  However she denies any recent lightheadedness or dizziness.  She has been coughing since the end of February.  She has white phlegm and chill this AM but no fever.  She also denies shortness of breath but has been wheezing.  She notes that she snores at night and that her husband says she sometimes stops breathing.  She does not feel well-rested in the morning when she gets  up.  Zoe Clarke continues to smoke 1/2 pack of cigarettes daily.  She was able to quit in the past but started back when several family members passed away.     The patient does not have symptoms concerning for COVID-19 infection (fever, chills, cough, or new shortness of breath).    Past Medical History:  Diagnosis Date   Allergy    Arthritis    Asthma    Diabetes mellitus without complication (HCC)    Gastric ulcer    GERD  (gastroesophageal reflux disease)    Hypertension    Osteoporosis    Past Surgical History:  Procedure Laterality Date   TUBAL LIGATION       Current Meds  Medication Sig   albuterol (PROVENTIL HFA;VENTOLIN HFA) 108 (90 Base) MCG/ACT inhaler Inhale 2 puffs into the lungs every 6 (six) hours as needed for wheezing or shortness of breath.   atorvastatin (LIPITOR) 40 MG tablet Take 40 mg by mouth daily.   carvedilol (COREG) 3.125 MG tablet Take 1 tablet (3.125 mg total) by mouth 2 (two) times daily with a meal.   cloNIDine (CATAPRES) 0.2 MG tablet Take 0.2 mg by mouth 3 (three) times daily.   cyclobenzaprine (FLEXERIL) 5 MG tablet Take 5 mg by mouth at bedtime.   fexofenadine (ALLEGRA) 180 MG tablet Take 180 mg by mouth daily.   furosemide (LASIX) 20 MG tablet Take 1 tablet (20 mg total) by mouth daily.   gabapentin (NEURONTIN) 300 MG capsule Take 300 mg by mouth as directed. 3 capsules 3 times a day   glipiZIDE (GLUCOTROL) 10 MG tablet Take 1 tablet (10 mg total) by mouth 2 (two) times daily before a meal.   linaclotide (LINZESS) 145 MCG CAPS capsule Take 145 mcg by mouth as needed (constipation).   oxyCODONE-acetaminophen (PERCOCET/ROXICET) 5-325 MG tablet Take by mouth as needed for severe pain (every 6-8 hours as needed).   polyethylene glycol (MIRALAX / GLYCOLAX) packet Take 17 g by mouth daily as needed for mild constipation.     Allergies:   Aspirin; Allopurinol; Amlodipine; and Lisinopril   Social History   Tobacco Use   Smoking status: Current Every Day Smoker    Packs/day: 0.50    Years: 39.00    Pack years: 19.50    Types: Cigarettes   Smokeless tobacco: Never Used   Tobacco comment: she used to smoke 2 packs a day   Substance Use Topics   Alcohol use: No    Alcohol/week: 0.0 standard drinks   Drug use: Yes    Types: Marijuana     Family Hx: The patient's family history includes Arthritis in her maternal grandmother and mother; Cancer in her  sister; Emphysema in her mother; Heart failure in her mother, sister, and sister; Hypertension in her maternal grandmother and mother; Lung disease in her sister; Stroke in her maternal grandfather. There is no history of Colon cancer, Esophageal cancer, Stomach cancer, or Rectal cancer.  ROS:   Please see the history of present illness.     All other systems reviewed and are negative.   Prior CV studies:   The following studies were reviewed today:  Echo 04/28/18: IMPRESSIONS   1. The left ventricle has normal systolic function of 56-21%. The cavity size was normal. There is mildly increased left ventricular wall thickness. Echo evidence of impaired diastolic relaxation Elevated mean left atrial pressure.  2. The right ventricle has normal systolic function. The cavity was normal. There is no increase in right  ventricular wall thickness.  3. Left atrial size was mildly dilated.  4. The mitral valve is normal in structure. There is mild thickening.  5. The tricuspid valve is normal in structure.  6. The aortic valve is normal in structure. Aortic valve regurgitation is mild by color flow Doppler.  7. The inferior vena cava was dilated in size with >50% respiratory variability.  Labs/Other Tests and Data Reviewed:    EKG:  An ECG dated 04/29/18 was personally reviewed today and demonstrated:  sinus tachycardia.  Rate 104 bpm.  Recent Labs: 04/27/2018: Magnesium 2.0; TSH 3.871 04/28/2018: B Natriuretic Peptide 451.4 05/01/2018: ALT 12; BUN 22; Creatinine, Ser 2.06; Hemoglobin 8.0; Platelets 219; Potassium 5.0; Sodium 141   Recent Lipid Panel Lab Results  Component Value Date/Time   CHOL 149 08/25/2015 03:45 PM   TRIG 90 08/25/2015 03:45 PM   HDL 47 08/25/2015 03:45 PM   CHOLHDL 3.2 08/25/2015 03:45 PM   LDLCALC 84 08/25/2015 03:45 PM    Wt Readings from Last 3 Encounters:  05/01/18 246 lb 3.2 oz (111.7 kg)  04/12/18 263 lb 0.1 oz (119.3 kg)  09/10/17 243 lb 1.6 oz (110.3 kg)       Objective:    Ht 5\' 8"  (1.727 m)    BMI 37.43 kg/m  GENERAL: Well-appearing.  No acute distress. HEENT: Pupils equal round.  Oral mucosa unremarkable NECK:  No jugular venous distention, no visible thyromegaly EXT:  No edema, no cyanosis no clubbing SKIN:  No rashes no nodules NEURO:  Speech fluent.  Cranial nerves grossly intact.  Moves all 4 extremities freely PSYCH:  Cognitively intact, oriented to person place and time  ASSESSMENT & PLAN:    # Chronic diastolic heart failure: Zoe Clarke has diastolic heart failure.  She is not symptomatic at this time and is doing well on low dose lasix.  We talked about the importance of BP control and limiting salt and fluids.  She expressed understanding.  # Essential hypertension: Zoe Clarke is unable to check her BP at home.  BP was labile in the hospital.  She is going to get a BP cuff.  Continue carvedilol, clonidine, and lasix.  Goal is <130/80.  # Hyperlipidemia: Continue atorvastatin.  # Tobacco abuse: Zoe Clarke was encouraged to quit smoking.  She is not ready at this time.  Recommend trying 1-800-QUIT-NOW.  # Pre-surgical risk assessment: Zoe Clarke is unable to exercise due to pain and gait instability.  She cannot achieve 4 METS of activity.  She will need to have a Lexiscan Myoview prior to surgery.  # Likely OSA:  Patient has snoring, apnea and tired upon awakening.  She will need a sleep study.    COVID-19 Education: The signs and symptoms of COVID-19 were discussed with the patient and how to seek care for testing (follow up with PCP or arrange E-visit).  The importance of social distancing was discussed today.  Time:   Today, I have spent 30 minutes with the patient with telehealth technology discussing the above problems.     Medication Adjustments/Labs and Tests Ordered: Current medicines are reviewed at length with the patient today.  Concerns regarding medicines are outlined above.   Tests Ordered: Orders  Placed This Encounter  Procedures   MYOCARDIAL PERFUSION IMAGING   Split night study    Medication Changes: Meds ordered this encounter  Medications   pantoprazole (PROTONIX) 40 MG tablet    Sig: Take 1 tablet (40 mg total) by mouth daily.  Dispense:  30 tablet    Refill:  11    Disposition:  Follow up in 1 month(s)  Signed, Skeet Latch, MD  07/15/2018 5:04 PM    [place]

## 2018-07-17 ENCOUNTER — Ambulatory Visit: Payer: PPO | Admitting: Physical Therapy

## 2018-07-17 ENCOUNTER — Other Ambulatory Visit: Payer: Self-pay | Admitting: Cardiovascular Disease

## 2018-07-17 NOTE — Telephone Encounter (Signed)
New message    *STAT* If patient is at the pharmacy, call can be transferred to refill team.   1. Which medications need to be refilled? (please list name of each medication and dose if known) carvedilol (COREG) 3.125 MG tablet   furosemide (LASIX) 20 MG tablet   cloNIDine (CATAPRES) 0.2 MG tablet   2. Which pharmacy/location (including street and city if local pharmacy) is medication to be sent to?[facility]  3. Do they need a 30 day or 90 day supply? [place]

## 2018-07-18 MED ORDER — CLONIDINE HCL 0.2 MG PO TABS: 0.2000 mg | ORAL_TABLET | Freq: Three times a day (TID) | ORAL | 11 refills | Status: DC

## 2018-07-18 MED ORDER — FUROSEMIDE 20 MG PO TABS: 20.0000 mg | ORAL_TABLET | Freq: Every day | ORAL | 11 refills | Status: DC

## 2018-07-18 MED ORDER — CARVEDILOL 3.125 MG PO TABS: 3.1250 mg | ORAL_TABLET | Freq: Two times a day (BID) | ORAL | 11 refills | Status: DC

## 2018-07-18 NOTE — Telephone Encounter (Signed)
Furosemide, clonidine and carvedilol refilled.

## 2018-07-28 ENCOUNTER — Other Ambulatory Visit: Payer: Self-pay | Admitting: Surgery

## 2018-07-28 DIAGNOSIS — K8020 Calculus of gallbladder without cholecystitis without obstruction: Secondary | ICD-10-CM | POA: Diagnosis not present

## 2018-07-29 ENCOUNTER — Observation Stay (HOSPITAL_COMMUNITY): Payer: PPO

## 2018-07-29 ENCOUNTER — Other Ambulatory Visit: Payer: Self-pay

## 2018-07-29 ENCOUNTER — Telehealth (HOSPITAL_COMMUNITY): Payer: Self-pay

## 2018-07-29 ENCOUNTER — Observation Stay (HOSPITAL_COMMUNITY)
Admission: EM | Admit: 2018-07-29 | Discharge: 2018-07-30 | Disposition: A | Discharge status: Home / Self Care | Payer: PPO | Attending: Emergency Medicine | Admitting: Emergency Medicine

## 2018-07-29 ENCOUNTER — Encounter (HOSPITAL_COMMUNITY): Payer: Self-pay

## 2018-07-29 ENCOUNTER — Emergency Department (HOSPITAL_COMMUNITY): Payer: PPO

## 2018-07-29 ENCOUNTER — Telehealth: Payer: Self-pay

## 2018-07-29 DIAGNOSIS — E1122 Type 2 diabetes mellitus with diabetic chronic kidney disease: Secondary | ICD-10-CM | POA: Insufficient documentation

## 2018-07-29 DIAGNOSIS — R1011 Right upper quadrant pain: Secondary | ICD-10-CM

## 2018-07-29 DIAGNOSIS — R1030 Lower abdominal pain, unspecified: Secondary | ICD-10-CM | POA: Diagnosis not present

## 2018-07-29 DIAGNOSIS — Z20828 Contact with and (suspected) exposure to other viral communicable diseases: Secondary | ICD-10-CM | POA: Insufficient documentation

## 2018-07-29 DIAGNOSIS — R7989 Other specified abnormal findings of blood chemistry: Secondary | ICD-10-CM | POA: Diagnosis not present

## 2018-07-29 DIAGNOSIS — R0600 Dyspnea, unspecified: Secondary | ICD-10-CM | POA: Diagnosis present

## 2018-07-29 DIAGNOSIS — E1165 Type 2 diabetes mellitus with hyperglycemia: Secondary | ICD-10-CM | POA: Diagnosis not present

## 2018-07-29 DIAGNOSIS — I509 Heart failure, unspecified: Secondary | ICD-10-CM | POA: Diagnosis not present

## 2018-07-29 DIAGNOSIS — I161 Hypertensive emergency: Secondary | ICD-10-CM | POA: Diagnosis not present

## 2018-07-29 DIAGNOSIS — I5033 Acute on chronic diastolic (congestive) heart failure: Secondary | ICD-10-CM | POA: Diagnosis not present

## 2018-07-29 DIAGNOSIS — K8080 Other cholelithiasis without obstruction: Secondary | ICD-10-CM | POA: Insufficient documentation

## 2018-07-29 DIAGNOSIS — I130 Hypertensive heart and chronic kidney disease with heart failure and stage 1 through stage 4 chronic kidney disease, or unspecified chronic kidney disease: Secondary | ICD-10-CM | POA: Diagnosis not present

## 2018-07-29 DIAGNOSIS — J90 Pleural effusion, not elsewhere classified: Secondary | ICD-10-CM | POA: Diagnosis not present

## 2018-07-29 DIAGNOSIS — R778 Other specified abnormalities of plasma proteins: Secondary | ICD-10-CM | POA: Diagnosis not present

## 2018-07-29 DIAGNOSIS — I5043 Acute on chronic combined systolic (congestive) and diastolic (congestive) heart failure: Secondary | ICD-10-CM

## 2018-07-29 DIAGNOSIS — K819 Cholecystitis, unspecified: Secondary | ICD-10-CM | POA: Diagnosis present

## 2018-07-29 DIAGNOSIS — I10 Essential (primary) hypertension: Secondary | ICD-10-CM | POA: Diagnosis not present

## 2018-07-29 DIAGNOSIS — R0609 Other forms of dyspnea: Secondary | ICD-10-CM

## 2018-07-29 DIAGNOSIS — N183 Chronic kidney disease, stage 3 unspecified: Secondary | ICD-10-CM | POA: Diagnosis present

## 2018-07-29 DIAGNOSIS — R0602 Shortness of breath: Secondary | ICD-10-CM

## 2018-07-29 DIAGNOSIS — R0902 Hypoxemia: Secondary | ICD-10-CM | POA: Diagnosis not present

## 2018-07-29 DIAGNOSIS — Z79899 Other long term (current) drug therapy: Secondary | ICD-10-CM | POA: Diagnosis not present

## 2018-07-29 DIAGNOSIS — K8020 Calculus of gallbladder without cholecystitis without obstruction: Secondary | ICD-10-CM | POA: Diagnosis not present

## 2018-07-29 DIAGNOSIS — F17210 Nicotine dependence, cigarettes, uncomplicated: Secondary | ICD-10-CM | POA: Diagnosis not present

## 2018-07-29 DIAGNOSIS — Z209 Contact with and (suspected) exposure to unspecified communicable disease: Secondary | ICD-10-CM | POA: Diagnosis not present

## 2018-07-29 DIAGNOSIS — I110 Hypertensive heart disease with heart failure: Secondary | ICD-10-CM | POA: Diagnosis not present

## 2018-07-29 DIAGNOSIS — Z7984 Long term (current) use of oral hypoglycemic drugs: Secondary | ICD-10-CM | POA: Insufficient documentation

## 2018-07-29 DIAGNOSIS — J45909 Unspecified asthma, uncomplicated: Secondary | ICD-10-CM | POA: Diagnosis not present

## 2018-07-29 DIAGNOSIS — E119 Type 2 diabetes mellitus without complications: Secondary | ICD-10-CM | POA: Diagnosis present

## 2018-07-29 DIAGNOSIS — R1084 Generalized abdominal pain: Secondary | ICD-10-CM | POA: Diagnosis not present

## 2018-07-29 LAB — HEPATIC FUNCTION PANEL
ALT: 12 U/L (ref 0–44)
AST: 13 U/L — ABNORMAL LOW (ref 15–41)
Albumin: 3 g/dL — ABNORMAL LOW (ref 3.5–5.0)
Alkaline Phosphatase: 105 U/L (ref 38–126)
Bilirubin, Direct: 0.2 mg/dL (ref 0.0–0.2)
Indirect Bilirubin: 0.6 mg/dL (ref 0.3–0.9)
Total Bilirubin: 0.8 mg/dL (ref 0.3–1.2)
Total Protein: 7 g/dL (ref 6.5–8.1)

## 2018-07-29 LAB — CBC WITH DIFFERENTIAL/PLATELET
Abs Immature Granulocytes: 0.03 10*3/uL (ref 0.00–0.07)
Basophils Absolute: 0 10*3/uL (ref 0.0–0.1)
Basophils Relative: 0 %
Eosinophils Absolute: 0.2 10*3/uL (ref 0.0–0.5)
Eosinophils Relative: 2 %
HCT: 34.5 % — ABNORMAL LOW (ref 36.0–46.0)
Hemoglobin: 11 g/dL — ABNORMAL LOW (ref 12.0–15.0)
Immature Granulocytes: 0 %
Lymphocytes Relative: 22 %
Lymphs Abs: 2 10*3/uL (ref 0.7–4.0)
MCH: 26.8 pg (ref 26.0–34.0)
MCHC: 31.9 g/dL (ref 30.0–36.0)
MCV: 83.9 fL (ref 80.0–100.0)
Monocytes Absolute: 0.6 10*3/uL (ref 0.1–1.0)
Monocytes Relative: 6 %
Neutro Abs: 6.3 10*3/uL (ref 1.7–7.7)
Neutrophils Relative %: 70 %
Platelets: 246 10*3/uL (ref 150–400)
RBC: 4.11 MIL/uL (ref 3.87–5.11)
RDW: 14.5 % (ref 11.5–15.5)
WBC: 9.2 10*3/uL (ref 4.0–10.5)
nRBC: 0 % (ref 0.0–0.2)

## 2018-07-29 LAB — BASIC METABOLIC PANEL
Anion gap: 10 (ref 5–15)
BUN: 13 mg/dL (ref 8–23)
CO2: 23 mmol/L (ref 22–32)
Calcium: 9.2 mg/dL (ref 8.9–10.3)
Chloride: 109 mmol/L (ref 98–111)
Creatinine, Ser: 1.97 mg/dL — ABNORMAL HIGH (ref 0.44–1.00)
GFR calc Af Amer: 30 mL/min — ABNORMAL LOW (ref >60)
GFR calc non Af Amer: 26 mL/min — ABNORMAL LOW (ref >60)
Glucose, Bld: 176 mg/dL — ABNORMAL HIGH (ref 70–99)
Potassium: 4 mmol/L (ref 3.5–5.1)
Sodium: 142 mmol/L (ref 135–145)

## 2018-07-29 LAB — TROPONIN I
Troponin I: 0.03 ng/mL (ref <0.03)
Troponin I: 0.03 ng/mL (ref <0.03)

## 2018-07-29 LAB — D-DIMER, QUANTITATIVE: D-Dimer, Quant: 0.56 ug/mL-FEU — ABNORMAL HIGH (ref 0.00–0.50)

## 2018-07-29 LAB — BRAIN NATRIURETIC PEPTIDE: B Natriuretic Peptide: 720.1 pg/mL — ABNORMAL HIGH (ref 0.0–100.0)

## 2018-07-29 LAB — SARS CORONAVIRUS 2 BY RT PCR (HOSPITAL ORDER, PERFORMED IN ~~LOC~~ HOSPITAL LAB): SARS Coronavirus 2: NEGATIVE

## 2018-07-29 LAB — GLUCOSE, CAPILLARY: Glucose-Capillary: 168 mg/dL — ABNORMAL HIGH (ref 70–99)

## 2018-07-29 MED ORDER — CARVEDILOL 3.125 MG PO TABS
3.1250 mg | ORAL_TABLET | Freq: Two times a day (BID) | ORAL | Status: DC
  Administered 2018-07-30: 3.125 mg via ORAL
  Filled 2018-07-29 (×2): qty 1

## 2018-07-29 MED ORDER — ACETAMINOPHEN 650 MG RE SUPP: 650.0000 mg | Freq: Four times a day (QID) | RECTAL | Status: DC | PRN

## 2018-07-29 MED ORDER — ATORVASTATIN CALCIUM 40 MG PO TABS
40.0000 mg | ORAL_TABLET | Freq: Every day | ORAL | Status: DC
  Administered 2018-07-29: 40 mg via ORAL
  Filled 2018-07-29: qty 1

## 2018-07-29 MED ORDER — ONDANSETRON HCL 4 MG/2ML IJ SOLN
4.0000 mg | Freq: Four times a day (QID) | INTRAMUSCULAR | Status: DC | PRN
  Administered 2018-07-29: 4 mg via INTRAVENOUS
  Filled 2018-07-29: qty 2

## 2018-07-29 MED ORDER — SODIUM CHLORIDE 0.9% FLUSH: 3.0000 mL | INTRAVENOUS | Status: DC | PRN

## 2018-07-29 MED ORDER — CLONIDINE HCL 0.2 MG PO TABS
0.2000 mg | ORAL_TABLET | Freq: Three times a day (TID) | ORAL | Status: DC
  Administered 2018-07-29 – 2018-07-30 (×2): 0.2 mg via ORAL
  Filled 2018-07-29 (×3): qty 1

## 2018-07-29 MED ORDER — FUROSEMIDE 10 MG/ML IJ SOLN
20.0000 mg | Freq: Once | INTRAMUSCULAR | Status: AC
  Administered 2018-07-29: 15:00:00 20 mg via INTRAVENOUS
  Filled 2018-07-29: qty 2

## 2018-07-29 MED ORDER — ALBUTEROL SULFATE HFA 108 (90 BASE) MCG/ACT IN AERS
4.0000 | INHALATION_SPRAY | Freq: Once | RESPIRATORY_TRACT | Status: AC
  Administered 2018-07-29: 4 via RESPIRATORY_TRACT
  Filled 2018-07-29: qty 6.7

## 2018-07-29 MED ORDER — LABETALOL HCL 5 MG/ML IV SOLN
10.0000 mg | INTRAVENOUS | Status: DC | PRN
  Administered 2018-07-29: 20:00:00 10 mg via INTRAVENOUS
  Filled 2018-07-29: qty 4

## 2018-07-29 MED ORDER — SODIUM CHLORIDE 0.9 % IV SOLN: 250.0000 mL | INTRAVENOUS | Status: DC | PRN

## 2018-07-29 MED ORDER — HEPARIN SODIUM (PORCINE) 5000 UNIT/ML IJ SOLN
5000.0000 [IU] | Freq: Three times a day (TID) | INTRAMUSCULAR | Status: DC
  Administered 2018-07-30 (×2): 5000 [IU] via SUBCUTANEOUS
  Filled 2018-07-29 (×2): qty 1

## 2018-07-29 MED ORDER — SODIUM CHLORIDE 0.9% FLUSH
3.0000 mL | Freq: Two times a day (BID) | INTRAVENOUS | Status: DC
  Administered 2018-07-29 – 2018-07-30 (×2): 3 mL via INTRAVENOUS

## 2018-07-29 MED ORDER — ACETAMINOPHEN 325 MG PO TABS: 650.0000 mg | ORAL_TABLET | Freq: Four times a day (QID) | ORAL | Status: DC | PRN

## 2018-07-29 MED ORDER — OXYCODONE-ACETAMINOPHEN 5-325 MG PO TABS
1.0000 | ORAL_TABLET | ORAL | Status: DC | PRN
  Administered 2018-07-30: 1 via ORAL
  Filled 2018-07-29: qty 1

## 2018-07-29 MED ORDER — PANTOPRAZOLE SODIUM 40 MG PO TBEC
40.0000 mg | DELAYED_RELEASE_TABLET | Freq: Every day | ORAL | Status: DC
  Administered 2018-07-30: 40 mg via ORAL
  Filled 2018-07-29: qty 1

## 2018-07-29 MED ORDER — CYCLOBENZAPRINE HCL 10 MG PO TABS
5.0000 mg | ORAL_TABLET | Freq: Every day | ORAL | Status: DC
  Administered 2018-07-29: 5 mg via ORAL
  Filled 2018-07-29: qty 1

## 2018-07-29 MED ORDER — GABAPENTIN 300 MG PO CAPS
300.0000 mg | ORAL_CAPSULE | Freq: Three times a day (TID) | ORAL | Status: DC
  Administered 2018-07-29 – 2018-07-30 (×2): 300 mg via ORAL
  Filled 2018-07-29 (×3): qty 1

## 2018-07-29 MED ORDER — POLYETHYLENE GLYCOL 3350 17 G PO PACK: 17.0000 g | PACK | Freq: Every day | ORAL | Status: DC | PRN

## 2018-07-29 MED ORDER — INSULIN ASPART 100 UNIT/ML ~~LOC~~ SOLN
0.0000 [IU] | Freq: Three times a day (TID) | SUBCUTANEOUS | Status: DC
  Administered 2018-07-30: 3 [IU] via SUBCUTANEOUS

## 2018-07-29 MED ORDER — ONDANSETRON HCL 4 MG PO TABS: 4.0000 mg | ORAL_TABLET | Freq: Four times a day (QID) | ORAL | Status: DC | PRN

## 2018-07-29 MED ORDER — FUROSEMIDE 10 MG/ML IJ SOLN: 40.0000 mg | Freq: Two times a day (BID) | INTRAMUSCULAR | Status: DC

## 2018-07-29 NOTE — ED Provider Notes (Signed)
[facility] EMERGENCY DEPARTMENT Provider Note   CSN: [id number] Arrival date & time: 07/29/18  1121    History   Chief Complaint Chief Complaint  Patient presents with  . Shortness of Breath    HPI Zoe Clarke is a 68 y.o. female with a past medical history of asthma, chronic bronchitis, diastolic CHF, GERD, type 2 diabetes, hypertension who presents to ED for evaluation of shortness of breath since waking up this morning.  She states that symptoms are present with ambulation and at rest.  She does note that she has been wheezing but unfortunately has not had much improvement with her inhalers.  She notes a cough productive with mucus for the past several months.  She was seen by her surgeon this morning and was sent to the ED due to her symptoms.  She denies chest pain, fever, sick contacts or recent travel.  She has not had a COVID-19 test done.  She does not wear supplemental oxygen at home.  She reports compliance with her home medications, denies any recent medication changes.  She has not taken her medications this morning yet.  She has noticed that bilateral legs are more swollen than usual.     HPI  Past Medical History:  Diagnosis Date  . Allergy   . Arthritis   . Asthma   . Diabetes mellitus without complication ([place])   . Gastric ulcer   . GERD (gastroesophageal reflux disease)   . Hypertension   . Osteoporosis     Patient Active Problem List   Diagnosis Date Noted  . Acalculous cholecystitis 04/27/2018  . Acute systolic congestive heart failure ([place]) 04/27/2018  . Tobacco abuse counseling 04/27/2018  . Acute systolic CHF (congestive heart failure) ([place]) 04/27/2018  . Acute pyelonephritis 04/10/2018  . Hypertensive urgency 04/10/2018  . Leukocytosis 09/10/2017  . Anemia 09/10/2017  . Knee pain, bilateral 01/13/2015  . Bilateral hip pain 11/23/2014  . Diabetes mellitus type 2, uncontrolled, with complications ([place]) 01/60/1093  .  Intertrigo 10/25/2014  . GERD (gastroesophageal reflux disease) 10/25/2014  . Diabetic neuropathy ([place]) 10/25/2014  . Chronic low back pain 10/25/2014  . Current tobacco use 11/11/2013  . Stage 3 chronic kidney disease due to type 2 diabetes mellitus ([place]) 09/02/2013  . Essential (primary) hypertension 09/02/2013  . Gout 12/05/2012    Past Surgical History:  Procedure Laterality Date  . TUBAL LIGATION       OB History   No obstetric history on file.      Home Medications    Prior to Admission medications   Medication Sig Start Date End Date Taking? Authorizing Provider  albuterol (PROVENTIL HFA;VENTOLIN HFA) 108 (90 Base) MCG/ACT inhaler Inhale 2 puffs into the lungs every 6 (six) hours as needed for wheezing or shortness of breath. 04/14/18   Bonnell Public, MD  atorvastatin (LIPITOR) 40 MG tablet Take 40 mg by mouth daily.    [provider]  carvedilol (COREG) 3.125 MG tablet Take 1 tablet (3.125 mg total) by mouth 2 (two) times daily with a meal. 07/18/18   Skeet Latch, MD  cloNIDine (CATAPRES) 0.2 MG tablet Take 1 tablet (0.2 mg total) by mouth 3 (three) times daily. 07/18/18   Skeet Latch, MD  cyclobenzaprine (FLEXERIL) 5 MG tablet Take 5 mg by mouth at bedtime.    [provider]  fexofenadine (ALLEGRA) 180 MG tablet Take 180 mg by mouth daily.    [provider]  furosemide (LASIX) 20 MG tablet  Take 1 tablet (20 mg total) by mouth daily. 07/18/18   Skeet Latch, MD  gabapentin (NEURONTIN) 300 MG capsule Take 300 mg by mouth as directed. 3 capsules 3 times a day    [provider]  glipiZIDE (GLUCOTROL) 10 MG tablet Take 1 tablet (10 mg total) by mouth 2 (two) times daily before a meal. 08/27/15   Weber, Damaris Hippo, PA-C  linaclotide (LINZESS) 145 MCG CAPS capsule Take 145 mcg by mouth as needed (constipation).    [provider]  oxyCODONE-acetaminophen (PERCOCET/ROXICET) 5-325 MG tablet Take by mouth as needed for  severe pain (every 6-8 hours as needed).    [provider]  pantoprazole (PROTONIX) 40 MG tablet Take 1 tablet (40 mg total) by mouth daily. 07/14/18   Skeet Latch, MD  polyethylene glycol [facility] / [name] packet Take 17 g by mouth daily as needed for mild constipation. 04/14/18   Bonnell Public, MD    Family History Family History  Problem Relation Age of Onset  . Arthritis Mother   . Hypertension Mother   . Heart failure Mother   . Emphysema Mother   . Arthritis Maternal Grandmother   . Hypertension Maternal Grandmother   . Stroke Maternal Grandfather   . Cancer Sister        unknown type cancer  . Heart failure Sister   . Lung disease Sister   . Heart failure Sister   . Colon cancer Neg Hx   . Esophageal cancer Neg Hx   . Stomach cancer Neg Hx   . Rectal cancer Neg Hx     Social History Social History   Tobacco Use  . Smoking status: Current Every Day Smoker    Packs/day: 0.50    Years: 39.00    Pack years: 19.50    Types: Cigarettes  . Smokeless tobacco: Never Used  . Tobacco comment: she used to smoke 2 packs a day   Substance Use Topics  . Alcohol use: No    Alcohol/week: 0.0 standard drinks  . Drug use: Yes    Types: Marijuana     Allergies   Aspirin; Allopurinol; Amlodipine; and Lisinopril   Review of Systems Review of Systems  Constitutional: Negative for appetite change, chills and fever.  HENT: Negative for ear pain, rhinorrhea, sneezing and sore throat.   Eyes: Negative for photophobia and visual disturbance.  Respiratory: Positive for cough and shortness of breath. Negative for chest tightness and wheezing.   Cardiovascular: Positive for leg swelling. Negative for chest pain and palpitations.  Gastrointestinal: Negative for abdominal pain, blood in stool, constipation, diarrhea, nausea and vomiting.  Genitourinary: Negative for dysuria, hematuria and urgency.  Musculoskeletal: Negative for myalgias.  Skin: Negative for  rash.  Neurological: Negative for dizziness, weakness and light-headedness.     Physical Exam Updated Vital Signs BP (!) 159/82 (BP Location: Right Arm)   Pulse 81   Temp 98.3 F (36.8 C) (Oral)   Resp (!) 24   Ht 5\' 8"  (1.727 m)   Wt 109.3 kg   SpO2 98%   BMI 36.64 kg/m   Physical Exam Vitals signs and nursing note reviewed.  Constitutional:      General: She is not in acute distress.    Appearance: She is well-developed.  HENT:     Head: Normocephalic and atraumatic.     Nose: Nose normal.  Eyes:     General: No scleral icterus.       Left eye: No discharge.  Conjunctiva/sclera: Conjunctivae normal.  Neck:     Musculoskeletal: Normal range of motion and neck supple.  Cardiovascular:     Rate and Rhythm: Normal rate and regular rhythm.     Heart sounds: Normal heart sounds. No murmur. No friction rub. No gallop.   Pulmonary:     Effort: Pulmonary effort is normal. Tachypnea present. No respiratory distress.     Breath sounds: Normal breath sounds.  Abdominal:     General: Bowel sounds are normal. There is no distension.     Palpations: Abdomen is soft.     Tenderness: There is no abdominal tenderness. There is no guarding.  Musculoskeletal: Normal range of motion.     Right lower leg: Edema present.     Left lower leg: Edema present.     Comments: 1+ pitting edema in bilateral lower extremities. 2+ DP pulse palpated  Skin:    General: Skin is warm and dry.     Findings: No rash.  Neurological:     Mental Status: She is alert.     Motor: No abnormal muscle tone.     Coordination: Coordination normal.      ED Treatments / Results  Labs (all labs ordered are listed, but only abnormal results are displayed) Labs Reviewed  BASIC METABOLIC PANEL - Abnormal; Notable for the following components:      Result Value   Glucose, Bld 176 (*)    Creatinine, Ser 1.97 (*)    GFR calc non Af Amer 26 (*)    GFR calc Af Amer 30 (*)    All other components within  normal limits  CBC WITH DIFFERENTIAL/PLATELET - Abnormal; Notable for the following components:   Hemoglobin 11.0 (*)    HCT 34.5 (*)    All other components within normal limits  TROPONIN I - Abnormal; Notable for the following components:   Troponin I 0.03 (*)    All other components within normal limits  BRAIN NATRIURETIC PEPTIDE - Abnormal; Notable for the following components:   B Natriuretic Peptide 720.1 (*)    All other components within normal limits  SARS CORONAVIRUS 2 (HOSPITAL ORDER, [place] [facility])  HEPATIC FUNCTION PANEL  D-DIMER, QUANTITATIVE (NOT AT [facility])    EKG EKG Interpretation  Date/Time:  Tuesday Jul 29 2018 12:50:15 EDT Ventricular Rate:  84 PR Interval:    QRS Duration: 80 QT Interval:  430 QTC Calculation: 509 R Axis:   38 Text Interpretation:  Sinus rhythm Prolonged QT interval No significant change since last tracing Confirmed by Davonna Belling [phone]) on 07/29/2018 12:52:55 PM   Radiology Dg Chest Portable 1 View  Result Date: 07/29/2018 CLINICAL DATA:  Chest pain short of breath EXAM: PORTABLE CHEST 1 VIEW COMPARISON:  04/28/2018 FINDINGS: Cardiac enlargement without heart failure. Lungs are now clear without infiltrate or effusion. Interval resolution of small effusions and right lower lobe airspace disease. IMPRESSION: No active disease. Electronically Signed   By: Franchot Gallo M.D.   On: 07/29/2018 11:56    Procedures .Critical Care Performed by: Delia Heady, PA-C Authorized by: Delia Heady, PA-C   Critical care provider statement:    Critical care time (minutes):  35   Critical care was necessary to treat or prevent imminent or life-threatening deterioration of the following conditions:  Cardiac failure and respiratory failure   Critical care was time spent personally by me on the following activities:  Development of treatment plan with patient or surrogate, discussions with consultants, evaluation of  patient's  response to treatment, obtaining history from patient or surrogate, ordering and review of laboratory studies, ordering and review of radiographic studies, re-evaluation of patient's condition and review of old charts   (including critical care time)  Medications Ordered in ED Medications  albuterol (VENTOLIN HFA) 108 (90 Base) MCG/ACT inhaler 4 puff (4 puffs Inhalation Given 07/29/18 1204)  furosemide (LASIX) injection 20 mg (20 mg Intravenous Given 07/29/18 1438)     Initial Impression / Assessment and Plan / ED Course  I have reviewed the triage vital signs and the nursing notes.  Pertinent labs & imaging results that were available during my care of the patient were reviewed by me and considered in my medical decision making (see chart for details).  Clinical Course as of Jul 28 1525  Tue Jul 29, 2018  1310 Troponin I(!!): 0.03 [HK]  1310 B Natriuretic Peptide(!): 720.1 [HK]  1326 Oral temp is 98.72F   [HK]  1409 Patient is on half of a liter of oxygen for comfort.  She has not been hypoxic.   [HK]    Clinical Course User Index [HK] Delia Heady, PA-C       Zoe Clarke was evaluated in Emergency Department on 07/29/18  for the symptoms described in the history of present illness. He/she was evaluated in the context of the global COVID-19 pandemic, which necessitated consideration that the patient might be at risk for infection with the SARS-CoV-2 virus that causes COVID-19. Institutional protocols and algorithms that pertain to the evaluation of patients at risk for COVID-19 are in a state of rapid change based on information released by regulatory bodies including the CDC and federal and state organizations. These policies and algorithms were followed during the patient's care in the ED.  68 year old female with a past medical history of asthma, chronic bronchitis, diastolic CHF, GERD, type 2 diabetes, hypertension presents to ED for shortness of breath since waking up this  morning.  She has had no improvement with her home inhalers.  She reports cough productive with mucus since February 2020.  She is in the process of having preop clearance for a cholecystectomy in the future.  She was sent to the ED by her surgeon due to her complaints of shortness of breath.  She does not wear supplemental oxygen at home.  On my exam she is tachypneic but she is not requiring supplemental oxygen.  Her oxygen saturations have stayed above 97% on room air.  She does have bilateral lower extremity pitting edema symmetrically without calf tenderness.  She denies any chest pain, fever, sick contacts or recent COVID-19 testing.  Lab work significant for BNP elevated to 720, troponin of 0.03.  She is afebrile.  She is currently on 0.5 L of oxygen by nasal cannula for comfort but has not been hypoxic.  Chest x-ray shows no acute findings.  EKG shows sinus rhythm with prolonged QT, no changes from prior tracings.  Creatinine of 1.97 which is similar to her baseline and history of CKD 3.  COVID-19 testing pending.  She is scheduled for stress test tomorrow.  However, due to her symptoms and lab work today, I feel it is reasonable to consult cardiology for further work-up.  Awaiting their recommendations, will give IV Lasix in the meantime for diuresis.  3:25 PM Cardiology recommends admitting to hospitalist and they will consult. Will add D-dimer and LFTs, as they state she is complaining of palpitations and abdominal pain as well.  Care handed off to  oncoming provider PA Valere Dross. Will admit once labwork returns.   Final Clinical Impressions(s) / ED Diagnoses   Final diagnoses:  Elevated troponin  Acute on chronic diastolic congestive heart failure (HCC)  Shortness of breath    ED Discharge Orders    None       Portions of this note were generated with Dragon dictation software. Dictation errors may occur despite best attempts at proofreading.    Delia Heady, PA-C 07/29/18 1527     Davonna Belling, MD 08/04/18 [phone]

## 2018-07-29 NOTE — ED Provider Notes (Signed)
Physical Exam  BP (!) 180/82   Pulse 81   Temp 98.3 F (36.8 C) (Oral)   Resp (!) 22   Ht 5\' 8"  (1.727 m)   Wt 109.3 kg   SpO2 98%   BMI 36.64 kg/m   Assumed care from [facility], PA-C at 1600. Briefly, the patient is a 68 y.o. female with PMHx of  has a past medical history of Allergy, Arthritis, Asthma, Chronic diastolic CHF (congestive heart failure) ([place]) (04/2018), CKD (chronic kidney disease), stage III ([place]), Diabetes mellitus without complication ([place]), Gastric ulcer, GERD (gastroesophageal reflux disease), Hypertension, and Osteoporosis. here with ongoing shortness of breath.  Patient is receiving ongoing clearance work-up for cholecystectomy and was sent to the emergency department from Dr. Trevor Mace office today during this evaluation.  Cardiology consulted on the patient and they recommend admission for chest pain rule out given the slight elevation in her troponin of 0.03 currently.  Labs Reviewed  BASIC METABOLIC PANEL - Abnormal; Notable for the following components:      Result Value   Glucose, Bld 176 (*)    Creatinine, Ser 1.97 (*)    GFR calc non Af Amer 26 (*)    GFR calc Af Amer 30 (*)    All other components within normal limits  CBC WITH DIFFERENTIAL/PLATELET - Abnormal; Notable for the following components:   Hemoglobin 11.0 (*)    HCT 34.5 (*)    All other components within normal limits  TROPONIN I - Abnormal; Notable for the following components:   Troponin I 0.03 (*)    All other components within normal limits  BRAIN NATRIURETIC PEPTIDE - Abnormal; Notable for the following components:   B Natriuretic Peptide 720.1 (*)    All other components within normal limits  HEPATIC FUNCTION PANEL - Abnormal; Notable for the following components:   Albumin 3.0 (*)    AST 13 (*)    All other components within normal limits  D-DIMER, QUANTITATIVE (NOT AT [facility]) - Abnormal; Notable for the following components:   D-Dimer, Quant 0.56 (*)    All other components  within normal limits  SARS CORONAVIRUS 2 (HOSPITAL ORDER, [place] [facility])    Course of Care:   Physical Exam Vitals signs and nursing note reviewed.  Constitutional:      General: She is not in acute distress.    Appearance: She is well-developed. She is not diaphoretic.     Comments: Sitting comfortably in bed.  HENT:     Head: Normocephalic and atraumatic.  Eyes:     General:        Right eye: No discharge.        Left eye: No discharge.     Conjunctiva/sclera: Conjunctivae normal.     Comments: EOMs normal to gross examination.  Neck:     Musculoskeletal: Normal range of motion.  Cardiovascular:     Rate and Rhythm: Normal rate and regular rhythm.  Pulmonary:     Breath sounds: Examination of the right-middle field reveals wheezing. Examination of the left-middle field reveals wheezing. Examination of the right-lower field reveals wheezing. Examination of the left-lower field reveals wheezing. Wheezing present.     Comments: Speaking in mostly full sentences.  Abdominal:     General: There is no distension.  Musculoskeletal: Normal range of motion.  Skin:    General: Skin is warm and dry.  Neurological:     Mental Status: She is alert.     Comments: Cranial  nerves intact to gross observation. Patient moves extremities without difficulty.  Psychiatric:        Behavior: Behavior normal.        Thought Content: Thought content normal.        Judgment: Judgment normal.     ED Course/Procedures   Clinical Course as of Jul 28 1699  Tue Jul 29, 2018  1310 Troponin I(!!): 0.03 [HK]  1310 B Natriuretic Peptide(!): 720.1 [HK]  1326 Oral temp is 98.21F   [HK]  1409 Patient is on half of a liter of oxygen for comfort.  She has not been hypoxic.   [HK]  1700 Dr. Maylene Roes to admit. Appreciate her involvement.    [AM]    Clinical Course User Index [AM] Albesa Seen, PA-C [HK] Delia Heady, [place]    Procedures  MDM   This is a 68 year old  female with complex past medical history including CKD, CHF, type 2 diabetes and cardiovascular risk factors presenting for relatively acute onset of shortness of breath today.  Cardiology evaluated the patient and feels that she needs to be admitted per medicine with her consultation.  I appreciate their involvement.  At the time of signout, d-dimer and delta troponin are pending. Presentation felt to be more consistent with CHF exacerbation per primary evaluating team.   D-dimer returned at 0.56, which is technically negative her age-adjusted d-dimer.  Given that patient has poor kidney function at baseline, will discuss with hospital medicine and defer decision to further evaluate this.  Patient admitted to [facility]. Appreciate their involvement.     Albesa Seen, PA-C 07/29/18 1701    Tegeler, Gwenyth Allegra, MD 07/29/18 [phone]

## 2018-07-29 NOTE — Telephone Encounter (Signed)
   Primary Cardiologist: Skeet Latch, MD  Chart reviewed as part of pre-operative protocol coverage.   The patient is currently hospitalized for a heart failure exacerbation.  Therefore,  Zoe Clarke will require a follow-up visit for further pre-operative risk assessment.    In addition, Dr. Oval Linsey recommended a Lexiscan Myoview prior to surgical clearance.  I will send a request to schedule this.  A follow-up visit with cardiology will be scheduled at discharge.     [place], PA 07/29/2018, 3:25 PM

## 2018-07-29 NOTE — ED Notes (Signed)
ED TO INPATIENT HANDOFF REPORT  ED Nurse Name and Phone #: Jonelle Sidle, [place] S Name/Age/Gender Zoe Clarke 68 y.o. female Room/Bed: 033C/033C  Code Status   Code Status: Prior  Home/SNF/Other Home Patient oriented to: self, place, time and situation Is this baseline? Yes   Triage Complete: Triage complete  Chief Complaint SOB  Triage Note Pt BIB [facility] for eval of shortness of breath onset this AM. Denies associated CP, endorses palpitations onset this AM about same time as SOB. Pt reports hx of CHF. No O2 requirement on arrival   Allergies Allergies  Allergen Reactions  . Aspirin Other (See Comments)    Feels funny   . Allopurinol     Dizzy; hard to ambulate  Couldn't see; sweating  . Amlodipine Other (See Comments)    headache  . Lisinopril Swelling    Face swelling    Level of Care/Admitting Diagnosis ED Disposition    ED Disposition Condition Comment   Admit  Hospital Area: [facility] [[id number]]  Level of Care: Telemetry Cardiac [103]  I expect the patient will be discharged within 24 hours: No (not a candidate for 5C-Observation unit)  Covid Evaluation: N/A  Diagnosis: Dyspnea [224497]  Admitting Physician: Dessa Phi [[id number]]  Attending Physician: Dessa Phi [phone]  PT Class (Do Not Modify): Observation [104]  PT Acc Code (Do Not Modify): Observation [10022]       B Medical/Surgery History Past Medical History:  Diagnosis Date  . Allergy   . Arthritis   . Asthma   . Chronic diastolic CHF (congestive heart failure) ([place]) 04/2018  . CKD (chronic kidney disease), stage III ([place])   . Diabetes mellitus without complication ([place])   . Gastric ulcer   . GERD (gastroesophageal reflux disease)   . Hypertension   . Osteoporosis    Past Surgical History:  Procedure Laterality Date  . TUBAL LIGATION       A IV Location/Drains/Wounds Patient Lines/Drains/Airways Status   Active Line/Drains/Airways    Name:    Placement date:   Placement time:   Site:   Days:   Peripheral IV 07/29/18 Left Hand   07/29/18    1133    Hand   less than 1   External Urinary Catheter   04/10/18    1618    -   110          Intake/Output Last 24 hours No intake or output data in the 24 hours ending 07/29/18 1738  Labs/Imaging Results for orders placed or performed during the hospital encounter of 07/29/18 (from the past 48 hour(s))  Basic metabolic panel     Status: Abnormal   Collection Time: 07/29/18 11:41 AM  Result Value Ref Range   Sodium 142 135 - 145 mmol/L   Potassium 4.0 3.5 - 5.1 mmol/L   Chloride 109 98 - 111 mmol/L   CO2 23 22 - 32 mmol/L   Glucose, Bld 176 (H) 70 - 99 mg/dL   BUN 13 8 - 23 mg/dL   Creatinine, Ser 1.97 (H) 0.44 - 1.00 mg/dL   Calcium 9.2 8.9 - 10.3 mg/dL   GFR calc non Af Amer 26 (L) >60 mL/min   GFR calc Af Amer 30 (L) >60 mL/min   Anion gap 10 5 - 15    Comment: Performed at [facility], [address], [place], [place] [address]  CBC with Differential     Status: Abnormal   Collection Time: 07/29/18 11:41 AM  Result Value Ref Range   WBC 9.2 4.0 - 10.5 K/uL   RBC 4.11 3.87 - 5.11 MIL/uL   Hemoglobin 11.0 (L) 12.0 - 15.0 g/dL   HCT 34.5 (L) 36.0 - 46.0 %   MCV 83.9 80.0 - 100.0 fL   MCH 26.8 26.0 - 34.0 pg   MCHC 31.9 30.0 - 36.0 g/dL   RDW 14.5 11.5 - 15.5 %   Platelets 246 150 - 400 K/uL   nRBC 0.0 0.0 - 0.2 %   Neutrophils Relative % 70 %   Neutro Abs 6.3 1.7 - 7.7 K/uL   Lymphocytes Relative 22 %   Lymphs Abs 2.0 0.7 - 4.0 K/uL   Monocytes Relative 6 %   Monocytes Absolute 0.6 0.1 - 1.0 K/uL   Eosinophils Relative 2 %   Eosinophils Absolute 0.2 0.0 - 0.5 K/uL   Basophils Relative 0 %   Basophils Absolute 0.0 0.0 - 0.1 K/uL   Immature Granulocytes 0 %   Abs Immature Granulocytes 0.03 0.00 - 0.07 K/uL    Comment: Performed at [facility], [address], [place], [place] [address]  Troponin I - Once     Status: Abnormal   Collection Time:  07/29/18 11:41 AM  Result Value Ref Range   Troponin I 0.03 (HH) <0.03 ng/mL    Comment: CRITICAL RESULT CALLED TO, READ BACK BY AND VERIFIED WITH: T Xabi Wittler RN AT 2951 ON [id number] BY K FORSYTH Performed at [facility], [place] [address] [place], [place] [address]   Hepatic function panel     Status: Abnormal   Collection Time: 07/29/18 11:41 AM  Result Value Ref Range   Total Protein 7.0 6.5 - 8.1 g/dL   Albumin 3.0 (L) 3.5 - 5.0 g/dL   AST 13 (L) 15 - 41 U/L   ALT 12 0 - 44 U/L   Alkaline Phosphatase 105 38 - 126 U/L   Total Bilirubin 0.8 0.3 - 1.2 mg/dL   Bilirubin, Direct 0.2 0.0 - 0.2 mg/dL   Indirect Bilirubin 0.6 0.3 - 0.9 mg/dL    Comment: Performed at [facility] [address], [place], [place] [address]  D-dimer, quantitative (not at [facility])     Status: Abnormal   Collection Time: 07/29/18 11:41 AM  Result Value Ref Range   D-Dimer, Quant 0.56 (H) 0.00 - 0.50 ug/mL-FEU    Comment: (NOTE) At the manufacturer cut-off of 0.50 ug/mL FEU, this assay has been documented to exclude PE with a sensitivity and negative predictive value of 97 to 99%.  At this time, this assay has not been approved by the FDA to exclude DVT/VTE. Results should be correlated with clinical presentation. Performed at [facility], [place] [address] [place], [place] [address]   Brain natriuretic peptide     Status: Abnormal   Collection Time: 07/29/18 11:42 AM  Result Value Ref Range   B Natriuretic Peptide 720.1 (H) 0.0 - 100.0 pg/mL    Comment: Performed at [facility] [address] [place], [place] [address]  SARS Coronavirus 2 (CEPHEID- Performed in [facility] hospital lab), Hosp Order     Status: None   Collection Time: 07/29/18  1:40 PM  Result Value Ref Range   SARS Coronavirus 2 NEGATIVE NEGATIVE    Comment: (NOTE) If result is NEGATIVE SARS-CoV-2 target nucleic acids are NOT DETECTED. The SARS-CoV-2 RNA is generally detectable in upper and lower  respiratory  specimens during the acute  phase of infection. The lowest  concentration of SARS-CoV-2 viral copies this assay can detect is 250  copies / mL. A negative result does not preclude SARS-CoV-2 infection  and should not be used as the sole basis for treatment or other  patient management decisions.  A negative result may occur with  improper specimen collection / handling, submission of specimen other  than nasopharyngeal swab, presence of viral mutation(s) within the  areas targeted by this assay, and inadequate number of viral copies  (<250 copies / mL). A negative result must be combined with clinical  observations, patient history, and epidemiological information. If result is POSITIVE SARS-CoV-2 target nucleic acids are DETECTED. The SARS-CoV-2 RNA is generally detectable in upper and lower  respiratory specimens dur ing the acute phase of infection.  Positive  results are indicative of active infection with SARS-CoV-2.  Clinical  correlation with patient history and other diagnostic information is  necessary to determine patient infection status.  Positive results do  not rule out bacterial infection or co-infection with other viruses. If result is PRESUMPTIVE POSTIVE SARS-CoV-2 nucleic acids MAY BE PRESENT.   A presumptive positive result was obtained on the submitted specimen  and confirmed on repeat testing.  While 2019 novel coronavirus  (SARS-CoV-2) nucleic acids may be present in the submitted sample  additional confirmatory testing may be necessary for epidemiological  and / or clinical management purposes  to differentiate between  SARS-CoV-2 and other Sarbecovirus currently known to infect humans.  If clinically indicated additional testing with an alternate test  methodology [phone]) is advised. The SARS-CoV-2 RNA is generally  detectable in upper and lower respiratory sp ecimens during the acute  phase of infection. The expected result is Negative. Fact Sheet for  Patients:  StrictlyIdeas.no Fact Sheet for Healthcare Providers: BankingDealers.co.za This test is not yet approved or cleared by the [place] FDA and has been authorized for detection and/or diagnosis of SARS-CoV-2 by FDA under an Emergency Use Authorization (EUA).  This EUA will remain in effect (meaning this test can be used) for the duration of the COVID-19 declaration under Section 564(b)(1) of the Act, 21 U.S.C. section 360bbb-3(b)(1), unless the authorization is terminated or revoked sooner. Performed at [facility], [place] [address] [place], [place] [address]    Dg Chest Portable 1 View  Result Date: 07/29/2018 CLINICAL DATA:  Chest pain short of breath EXAM: PORTABLE CHEST 1 VIEW COMPARISON:  04/28/2018 FINDINGS: Cardiac enlargement without heart failure. Lungs are now clear without infiltrate or effusion. Interval resolution of small effusions and right lower lobe airspace disease. IMPRESSION: No active disease. Electronically Signed   By: Franchot Gallo M.D.   On: 07/29/2018 11:56    Pending Labs Unresulted Labs (From admission, onward)    Start     Ordered   07/29/18 1643  Troponin I - ONCE - STAT  ONCE - STAT,   STAT     07/29/18 1642   Signed and Held  CBC  Tomorrow morning,   R     Signed and Held   Signed and Held  Basic metabolic panel  Tomorrow morning,   R     Signed and Held   Signed and Held  Troponin I - Now Then Q6H  Now then every 6 hours,   R     Signed and Held          Vitals/Pain Today's Vitals   07/29/18 1530 07/29/18 1716 07/29/18 1736 07/29/18 1737  BP: Marland Kitchen)  161/126 (!) 177/61 130/60   Pulse:   64   Resp: (!) 21  20   Temp:    98.3 F (36.8 C)  TempSrc:    Oral  SpO2:      Weight:      Height:      PainSc:        Isolation Precautions Droplet and Contact precautions  Medications Medications  albuterol (VENTOLIN HFA) 108 (90 Base) MCG/ACT inhaler 4 puff (4 puffs Inhalation  Given 07/29/18 1204)  furosemide (LASIX) injection 20 mg (20 mg Intravenous Given 07/29/18 1438)    Mobility walks with device Moderate fall risk   Focused Assessments Pulmonary Assessment Handoff:  Lung sounds: Bilateral Breath Sounds: Expiratory wheezes L Breath Sounds: Expiratory wheezes R Breath Sounds: Expiratory wheezes O2 Device: Nasal Cannula O2 Flow Rate (L/min): 0.5 L/min      R Recommendations: See Admitting Provider Note  Report given to:   Additional Notes: Pt is on 0.5L [place] for comfort only

## 2018-07-29 NOTE — ED Triage Notes (Signed)
Pt BIB [facility] for eval of shortness of breath onset this AM. Denies associated CP, endorses palpitations onset this AM about same time as SOB. Pt reports hx of CHF. No O2 requirement on arrival

## 2018-07-29 NOTE — Telephone Encounter (Signed)
Sent a message to [facility] to see about getting test rescheduled due to pt being in the hospital

## 2018-07-29 NOTE — Consult Note (Signed)
Cardiology Consultation:   Patient ID: Zoe Clarke; [id number]; [birth year]   Admit date: 07/29/2018 Date of Consult: 07/29/2018  Primary Care Provider: Antony Contras, MD Primary Cardiologist: Skeet Latch, MD 07/14/2018 Primary Electrophysiologist:  None   Patient Profile:   Zoe Clarke is a 68 y.o. female with a hx of D-CHF, DM, HTN, CKD III, Asthma, GERD, who is being seen today for the evaluation of CHF at the request of Dr Alvino Chapel.  History of Present Illness:   Zoe Clarke had a video visit by Dr Oval Linsey 04/27, no wt reported, no sx reported, still smoking, needed Lexi MV prior to surgery, sleep study needed.  Zoe Clarke has been struggling with dyspnea on exertion and wheezing at times for a while.  She was weighing herself, and her weight was steady at 238.  She does not note any significant change in her eating habits.  She did not feel her respiratory condition was any different from when she had the video visit with Dr. Oval Linsey on 4/27.  She does wake in the night gasping for breath, but Dr. Oval Linsey was concerned that this was from sleep apnea.  She does snore. She has had coughing intermittently at times for a while.  She has been struggling with abdominal pain.  She has known gallstones, and was supposed to get a stress test tomorrow in order to be cleared for surgery by Dr. Ninfa Linden.  Yesterday, she had a visit with Dr. Ninfa Linden and her weight was 249.  She did not feel any more short of breath than usual.    Today, she woke up with a fluttering feeling in her chest.  She describes a heart rate of approximately 120, and some irregular beats as well.  She was short of breath with this.  She was a little lightheaded and dizzy.  She was also having abdominal pain.  Today, she had a new visit with Dr. Moreen Fowler.  In the office, she was feeling lightheaded and weak.  She became cold and clammy.  She was having a lot of cramping abdominal pain on the right, where the  pain from her gallbladder has been.  Then, she developed a feeling of pressure inside her chest.  PCP office called EMS.  She was not given any medications by EMS or PCP office.  In the emergency room, she is coughing and gagging up clear phlegm.  She is not currently complaining of any chest pain.  She does seem to have some orthopnea.  She has had Lasix 20 mg IV and an albuterol neb.  She is no longer wheezing, but still complains of abdominal pain and tenderness.   Past Medical History:  Diagnosis Date  . Allergy   . Arthritis   . Asthma   . Chronic diastolic CHF (congestive heart failure) ([place]) 04/2018  . CKD (chronic kidney disease), stage III ([place])   . Diabetes mellitus without complication ([place])   . Gastric ulcer   . GERD (gastroesophageal reflux disease)   . Hypertension   . Osteoporosis     Past Surgical History:  Procedure Laterality Date  . TUBAL LIGATION       Prior to Admission medications   Medication Sig Start Date End Date Taking? Authorizing Provider  albuterol (PROVENTIL HFA;VENTOLIN HFA) 108 (90 Base) MCG/ACT inhaler Inhale 2 puffs into the lungs every 6 (six) hours as needed for wheezing or shortness of breath. 04/14/18   Bonnell Public, MD  atorvastatin (LIPITOR) 40 MG tablet Take 40  mg by mouth daily.    [provider]  carvedilol (COREG) 3.125 MG tablet Take 1 tablet (3.125 mg total) by mouth 2 (two) times daily with a meal. 07/18/18   Skeet Latch, MD  cloNIDine (CATAPRES) 0.2 MG tablet Take 1 tablet (0.2 mg total) by mouth 3 (three) times daily. 07/18/18   Skeet Latch, MD  cyclobenzaprine (FLEXERIL) 5 MG tablet Take 5 mg by mouth at bedtime.    [provider]  fexofenadine (ALLEGRA) 180 MG tablet Take 180 mg by mouth daily.    [provider]  furosemide (LASIX) 20 MG tablet Take 1 tablet (20 mg total) by mouth daily. 07/18/18   Skeet Latch, MD  gabapentin (NEURONTIN) 300 MG capsule Take 300 mg by mouth as  directed. 3 capsules 3 times a day    [provider]  glipiZIDE (GLUCOTROL) 10 MG tablet Take 1 tablet (10 mg total) by mouth 2 (two) times daily before a meal. 08/27/15   Weber, Damaris Hippo, PA-C  linaclotide (LINZESS) 145 MCG CAPS capsule Take 145 mcg by mouth as needed (constipation).    [provider]  oxyCODONE-acetaminophen (PERCOCET/ROXICET) 5-325 MG tablet Take by mouth as needed for severe pain (every 6-8 hours as needed).    [provider]  pantoprazole (PROTONIX) 40 MG tablet Take 1 tablet (40 mg total) by mouth daily. 07/14/18   Skeet Latch, MD  polyethylene glycol [name] packet Take 17 g by mouth daily as needed for mild constipation. 04/14/18   Bonnell Public, MD    Inpatient Medications: Scheduled Meds:  Continuous Infusions:  PRN Meds:   Allergies:    Allergies  Allergen Reactions  . Aspirin Other (See Comments)    Feels funny   . Allopurinol     Dizzy; hard to ambulate  Couldn't see; sweating  . Amlodipine Other (See Comments)    headache  . Lisinopril Swelling    Face swelling    Social History:   Social History   Socioeconomic History  . Marital status: Married    Spouse name: Not on file  . Number of children: 2  . Years of education: 53  . Highest education level: Not on file  Occupational History  . Occupation: Housewife  Social Needs  . Financial resource strain: Not on file  . Food insecurity:    Worry: Not on file    Inability: Not on file  . Transportation needs:    Medical: Not on file    Non-medical: Not on file  Tobacco Use  . Smoking status: Current Every Day Smoker    Packs/day: 0.50    Years: 39.00    Pack years: 19.50    Types: Cigarettes  . Smokeless tobacco: Never Used  . Tobacco comment: she used to smoke 2 packs a day   Substance and Sexual Activity  . Alcohol use: No    Alcohol/week: 0.0 standard drinks  . Drug use: Yes    Types: Marijuana  . Sexual activity: Not on file   Lifestyle  . Physical activity:    Days per week: Not on file    Minutes per session: Not on file  . Stress: Not on file  Relationships  . Social connections:    Talks on phone: Not on file    Gets together: Not on file    Attends religious service: Not on file    Active member of club or organization: Not on file    Attends meetings of  clubs or organizations: Not on file    Relationship status: Not on file  . Intimate partner violence:    Fear of current or ex partner: Not on file    Emotionally abused: Not on file    Physically abused: Not on file    Forced sexual activity: Not on file  Other Topics Concern  . Not on file  Social History Narrative   Fun: Race video car games - PS2/3, paint by number, board and card games, puzzle books   Feels safe at home and denies abuse   Disabled - does not work from chronic pain   Children - 1 living children, 3 step children   Grandchildren - 77   Married - lives with husband   Uses [employer] transportation    Family History:   Family History  Problem Relation Age of Onset  . Arthritis Mother   . Hypertension Mother   . Heart failure Mother   . Emphysema Mother   . Arthritis Maternal Grandmother   . Hypertension Maternal Grandmother   . Stroke Maternal Grandfather   . Cancer Sister        unknown type cancer  . Heart failure Sister   . Lung disease Sister   . Heart failure Sister   . Colon cancer Neg Hx   . Esophageal cancer Neg Hx   . Stomach cancer Neg Hx   . Rectal cancer Neg Hx    Family Status:  Family Status  Relation Name Status  . Mother  Deceased  . Father  Deceased  . MGM  Deceased  . MGF  Deceased  . PGM  Deceased  . PGF  Deceased  . Sister  (Not Specified)  . Sister  (Not Specified)  . Neg Hx  (Not Specified)    ROS:  Please see the history of present illness.  All other ROS reviewed and negative.     Physical Exam/Data:   Vitals:   07/29/18 1129 07/29/18 1143 07/29/18 1247 07/29/18 1435  BP:   (!)  159/82   Pulse:   81   Resp:   (!) 24   Temp:    98.3 F (36.8 C)  TempSrc:    Oral  SpO2:  98% 98%   Weight: 109.3 kg     Height: 5\' 8"  (1.727 m)      No intake or output data in the 24 hours ending 07/29/18 1531 Filed Weights   07/29/18 1129  Weight: 109.3 kg   Body mass index is 36.64 kg/m.  General:  Well nourished, well developed, in no acute distress HEENT: normal Lymph: no adenopathy Neck: JVD 8 cm Endocrine:  No thryomegaly Vascular: No carotid bruits; 4/4 extremity pulses 2+, without bruits  Cardiac:  normal S1, S2; RRR; no murmur  Lungs: Few scattered rales bilaterally, no wheezing, rhonchi  Abd: soft, tender RUQ, no hepatomegaly  Ext: trace-1+ LE edema Musculoskeletal:  No deformities, BUE and BLE strength normal and equal Skin: warm and dry  Neuro:  CNs 2-12 intact, no focal abnormalities noted Psych:  Normal affect   EKG:  The EKG was personally reviewed and demonstrates:  05/12 ECG, SR, HR 84, QT/QTc 438/509, on 04/29/2018, QT/QTc 372/489 Telemetry:  Telemetry was personally reviewed and demonstrates:  SR  Relevant CV Studies:  Echo 04/28/18: IMPRESSIONS 1. The left ventricle has normal systolic function of 27-51%. The cavity size was normal. There is mildly increased left ventricular wall thickness. Echo evidence of impaired diastolic relaxation Elevated mean  left atrial pressure. 2. The right ventricle has normal systolic function. The cavity was normal. There is no increase in right ventricular wall thickness. 3. Left atrial size was mildly dilated. 4. The mitral valve is normal in structure. There is mild thickening. 5. The tricuspid valve is normal in structure. 6. The aortic valve is normal in structure. Aortic valve regurgitation is mild by color flow Doppler. 7. The inferior vena cava was dilated in size with >50% respiratory variability.   Laboratory Data:  Chemistry Recent Labs  Lab 07/29/18 1141  NA 142  K 4.0  CL 109  CO2 23   GLUCOSE 176*  BUN 13  CREATININE 1.97*  CALCIUM 9.2  GFRNONAA 26*  GFRAA 30*  ANIONGAP 10    Lab Results  Component Value Date   ALT 12 05/01/2018   AST 14 (L) 05/01/2018   ALKPHOS 62 05/01/2018   BILITOT 0.9 05/01/2018   Hematology Recent Labs  Lab 07/29/18 1141  WBC 9.2  RBC 4.11  HGB 11.0*  HCT 34.5*  MCV 83.9  MCH 26.8  MCHC 31.9  RDW 14.5  PLT 246   Cardiac Enzymes Recent Labs  Lab 07/29/18 1141  TROPONINI 0.03*     BNP Recent Labs  Lab 07/29/18 1142  BNP 720.1*    TSH:  Lab Results  Component Value Date   TSH 3.871 04/27/2018   Lipids: Lab Results  Component Value Date   CHOL 149 08/25/2015   HDL 47 08/25/2015   LDLCALC 84 08/25/2015   TRIG 90 08/25/2015   CHOLHDL 3.2 08/25/2015   HgbA1c: Lab Results  Component Value Date   HGBA1C 8.2 (H) 04/27/2018   Magnesium:  Magnesium  Date Value Ref Range Status  04/27/2018 2.0 1.7 - 2.4 mg/dL Final    Comment:    Performed at [facility], [place] [address], [place], [place] [address]     Radiology/Studies:  Dg Chest Portable 1 View  Result Date: 07/29/2018 CLINICAL DATA:  Chest pain short of breath EXAM: PORTABLE CHEST 1 VIEW COMPARISON:  04/28/2018 FINDINGS: Cardiac enlargement without heart failure. Lungs are now clear without infiltrate or effusion. Interval resolution of small effusions and right lower lobe airspace disease. IMPRESSION: No active disease. Electronically Signed   By: Franchot Gallo M.D.   On: 07/29/2018 11:56    Assessment and Plan:   1.  Acute on chronic diastolic CHF: - According to patient, her dry weight is 238 pounds. - According to patient, she was 249 pounds at Dr. Trevor Mace office yesterday and 241 pounds at Dr. Laqueta Linden office today. - Chest x-ray is normal, but she has mild JVD, mild lower extremity edema and BNP is higher than previous values - Because of poor renal function, do not wish to dry her out too much. -However, she has had 20 mg of Lasix IV  and would likely benefit from a couple more doses. - Would continue Lasix at 40 mg IV twice daily -Follow renal function closely - Follow weights daily - Hopefully will not need more than 24-48 hours of diuresis  2.  Stage III CKD: -Creatinine today 1.97. -With diuresis, creatinine peak was 2.26 during previous admission in February -Follow daily  3.  Palpitations: -Keep on telemetry -Discuss monitor with MD, she may need outpatient evaluation  4.  Possible sleep apnea: -She snores and wakes up in the middle of the night with shortness of breath -It is unclear if the PND is from sleep apnea or from CHF -  Follow-up for this while in hospital and schedule sleep study as an outpatient.  5.  Preoperative cardiac evaluation: - If she could get her stress test tomorrow at the office as scheduled, that would be better. -If she continues to have difficulties, may need to go ahead and do the stress testing in the hospital  6. Acalculous cholecystitis - driving force behind her sx may be her gallbladder - Has seen Dr Ninfa Linden for this - may be having biliary colic    Active Problems:   Diabetes mellitus type 2, uncontrolled, with complications ([place])   Stage 3 chronic kidney disease due to type 2 diabetes mellitus ([place])   Acalculous cholecystitis   Acute on chronic diastolic CHF (congestive heart failure) ([place])   For questions or updates, please contact [facility] Please consult [email or web address] for contact info under Cardiology/STEMI.   Jonetta Speak, PA-C  07/29/2018 3:31 PM

## 2018-07-29 NOTE — Telephone Encounter (Signed)
   [facility] Pre-operative Risk Assessment    Request for surgical clearance:  1. What type of surgery is being performed? Laparoscopic Cholecystectomy   2. When is this surgery scheduled? TBD   3. What type of clearance is required (medical clearance vs. Pharmacy clearance to hold med vs. Both)? Medical  4. Are there any medications that need to be held prior to surgery and how long? N/a   5. Practice name and name of physician performing surgery? [facility]    6. What is your office phone number [phone]   7.   What is your office fax number [phone] ATTN: Malachi Bonds  8.   Anesthesia type (None, local, MAC, general) ? General   Meryl Crutch 07/29/2018, 2:35 PM  _________________________________________________________________   (provider comments below)

## 2018-07-29 NOTE — Progress Notes (Addendum)
     To clarify:   At this time will make pt NPO after midnight.   Per Dr Kyla Balzarine note, get MV tomorrow in-hospital.   Will hold off on Lasix since lungs are clear and no fluid on CXR. Reason for elevated BNP is unclear.   Rosaria Ferries, PA-C 07/29/2018 5:23 PM Beeper [phone]

## 2018-07-29 NOTE — ED Notes (Signed)
Nurse Navigator checked on pt.  She had already spoken with family but I was able to help reposition her in bed to make her more comfortable, bring her some water and give her a new mask and a new emesis bag to spit phlegm into.

## 2018-07-29 NOTE — Telephone Encounter (Signed)
Encounter complete. 

## 2018-07-29 NOTE — H&P (Addendum)
History and Physical    Zoe Clarke OZH:[id number] DOB: 10/08/1950 DOA: 07/29/2018  PCP: Antony Contras, MD  Patient coming from: Home   Chief Complaint: Exertional dyspnea and wheezing  HPI: Zoe Clarke is a 68 y.o. female with medical history significant of chronic diastolic heart failure, CKD stage III, type 2 diabetes, hypertension who presents with chief complaint of exertional dyspnea and wheezing.  She is currently being evaluated by general surgery as well as cardiology; and currently awaiting for outpatient cholecystectomy and was being evaluated for outpatient preop cardiac clearance.  She complains of some exertional dyspnea as well as continued right upper quadrant abdominal pain.  Has some nausea as well.  She denies any fevers or chills.  Had productive cough of white sputum.  No chest pain or diarrhea.  ED Course: Labs include creatinine 1.97, BNP 720, troponin 0.03, d-dimer 0.56.  SARS-CoV-2 negative.  Chest x-ray without acute abnormality. Patient evaluated in the emergency department by cardiology team as well.  Review of Systems: As per HPI otherwise 10 point review of systems negative.   Past Medical History:  Diagnosis Date  . Allergy   . Arthritis   . Asthma   . Chronic diastolic CHF (congestive heart failure) ([place]) 04/2018  . CKD (chronic kidney disease), stage III ([place])   . Diabetes mellitus without complication ([place])   . Gastric ulcer   . GERD (gastroesophageal reflux disease)   . Hypertension   . Osteoporosis     Past Surgical History:  Procedure Laterality Date  . TUBAL LIGATION       reports that she has been smoking cigarettes. She has a 19.50 pack-year smoking history. She has never used smokeless tobacco. She reports current drug use. Drug: Marijuana. She reports that she does not drink alcohol.  Allergies  Allergen Reactions  . Aspirin Other (See Comments)    Feels funny   . Allopurinol     Dizzy; hard to ambulate  Couldn't see;  sweating  . Amlodipine Other (See Comments)    headache  . Lisinopril Swelling    Face swelling    Family History  Problem Relation Age of Onset  . Arthritis Mother   . Hypertension Mother   . Heart failure Mother   . Emphysema Mother   . Arthritis Maternal Grandmother   . Hypertension Maternal Grandmother   . Stroke Maternal Grandfather   . Cancer Sister        unknown type cancer  . Heart failure Sister   . Lung disease Sister   . Heart failure Sister   . Colon cancer Neg Hx   . Esophageal cancer Neg Hx   . Stomach cancer Neg Hx   . Rectal cancer Neg Hx      Prior to Admission medications   Medication Sig Start Date End Date Taking? Authorizing Provider  albuterol (PROVENTIL HFA;VENTOLIN HFA) 108 (90 Base) MCG/ACT inhaler Inhale 2 puffs into the lungs every 6 (six) hours as needed for wheezing or shortness of breath. 04/14/18  Yes Bonnell Public, MD  atorvastatin (LIPITOR) 40 MG tablet Take 40 mg by mouth daily.   Yes [provider]  carvedilol (COREG) 3.125 MG tablet Take 1 tablet (3.125 mg total) by mouth 2 (two) times daily with a meal. 07/18/18  Yes Skeet Latch, MD  cloNIDine (CATAPRES) 0.2 MG tablet Take 1 tablet (0.2 mg total) by mouth 3 (three) times daily. 07/18/18  Yes Skeet Latch, MD  cyclobenzaprine (FLEXERIL) 5 MG tablet Take  5 mg by mouth at bedtime.   Yes [provider]  fexofenadine (ALLEGRA) 180 MG tablet Take 180 mg by mouth 2 (two) times a day.    Yes [provider]  furosemide (LASIX) 20 MG tablet Take 1 tablet (20 mg total) by mouth daily. 07/18/18  Yes Skeet Latch, MD  gabapentin (NEURONTIN) 300 MG capsule Take 300 mg by mouth 3 (three) times daily.    Yes [provider]  glipiZIDE (GLUCOTROL) 10 MG tablet Take 1 tablet (10 mg total) by mouth 2 (two) times daily before a meal. 08/27/15  Yes Weber, Damaris Hippo, PA-C  linaclotide (LINZESS) 145 MCG CAPS capsule Take 145 mcg by mouth as needed (constipation).    Yes [provider]  oxyCODONE-acetaminophen (PERCOCET/ROXICET) 5-325 MG tablet Take by mouth as needed for severe pain (every 6-8 hours as needed).   Yes [provider]  pantoprazole (PROTONIX) 40 MG tablet Take 1 tablet (40 mg total) by mouth daily. 07/14/18  Yes Skeet Latch, MD  polyethylene glycol [facility] / [name] packet Take 17 g by mouth daily as needed for mild constipation. 04/14/18  Yes Bonnell Public, MD    Physical Exam: Vitals:   07/29/18 1435 07/29/18 1515 07/29/18 1530 07/29/18 1716  BP:  (!) 180/82 (!) 161/126 (!) 177/61  Pulse:      Resp:  (!) 22 (!) 21   Temp: 98.3 F (36.8 C)     TempSrc: Oral     SpO2:      Weight:      Height:         Constitutional: NAD, calm, comfortable Eyes: PERRL, lids and conjunctivae normal ENMT: Mucous membranes are moist. Posterior pharynx clear of any exudate or lesions.Normal dentition.  Neck: normal, supple, no masses, no thyromegaly Respiratory: clear to auscultation bilaterally, no wheezing, no crackles. Normal respiratory effort. No accessory muscle use.  Cardiovascular: Tachycardic rate, 110, regular rhythm, no murmurs / rubs / gallops. No extremity edema.  Abdomen: Tenderness to palpation right upper quadrant, no masses palpated. No hepatosplenomegaly. Bowel sounds positive.  Musculoskeletal: no clubbing / cyanosis. No joint deformity upper and lower extremities. Good ROM, no contractures. Normal muscle tone.  Left lower extremity tender to palpation in her calf Skin: no rashes, lesions, ulcers on exposed skin Neurologic: Nonfocal, speech clear Psychiatric: Normal judgment and insight. Alert and oriented x 3. Normal mood.   Labs on Admission: I have personally reviewed following labs and imaging studies  CBC: Recent Labs  Lab 07/29/18 1141  WBC 9.2  NEUTROABS 6.3  HGB 11.0*  HCT 34.5*  MCV 83.9  PLT 409   Basic Metabolic Panel: Recent Labs  Lab 07/29/18 1141  NA 142  K 4.0  CL  109  CO2 23  GLUCOSE 176*  BUN 13  CREATININE 1.97*  CALCIUM 9.2   GFR: Estimated Creatinine Clearance: 36.4 mL/min (A) (by C-G formula based on SCr of 1.97 mg/dL (H)). Liver Function Tests: Recent Labs  Lab 07/29/18 1141  AST 13*  ALT 12  ALKPHOS 105  BILITOT 0.8  PROT 7.0  ALBUMIN 3.0*   No results for input(s): LIPASE, AMYLASE in the last 168 hours. No results for input(s): AMMONIA in the last 168 hours. Coagulation Profile: No results for input(s): INR, PROTIME in the last 168 hours. Cardiac Enzymes: Recent Labs  Lab 07/29/18 1141  TROPONINI 0.03*   BNP (last 3 results) No results for input(s): PROBNP in the last 8760 hours. HbA1C: No results for input(s): HGBA1C  in the last 72 hours. CBG: No results for input(s): GLUCAP in the last 168 hours. Lipid Profile: No results for input(s): CHOL, HDL, LDLCALC, TRIG, CHOLHDL, LDLDIRECT in the last 72 hours. Thyroid Function Tests: No results for input(s): TSH, T4TOTAL, FREET4, T3FREE, THYROIDAB in the last 72 hours. Anemia Panel: No results for input(s): VITAMINB12, FOLATE, FERRITIN, TIBC, IRON, RETICCTPCT in the last 72 hours. Urine analysis:    Component Value Date/Time   COLORURINE YELLOW 04/27/2018 1334   APPEARANCEUR CLEAR 04/27/2018 1334   LABSPEC 1.012 04/27/2018 1334   PHURINE 7.0 04/27/2018 1334   GLUCOSEU 50 (A) 04/27/2018 1334   HGBUR SMALL (A) 04/27/2018 1334   BILIRUBINUR NEGATIVE 04/27/2018 1334   BILIRUBINUR negative 02/25/2015 1426   KETONESUR NEGATIVE 04/27/2018 1334   PROTEINUR 100 (A) 04/27/2018 1334   UROBILINOGEN 0.2 02/25/2015 1426   UROBILINOGEN 1.0 04/16/2009 0943   NITRITE NEGATIVE 04/27/2018 1334   LEUKOCYTESUR NEGATIVE 04/27/2018 1334   Sepsis Labs: !!!!!!!!!!!!!!!!!!!!!!!!!!!!!!!!!!!!!!!!!!!! @LABRCNTIP (procalcitonin:4,lacticidven:4) ) Recent Results (from the past 240 hour(s))  SARS Coronavirus 2 (CEPHEID- Performed in [facility] hospital lab), Hosp Order     Status: None    Collection Time: 07/29/18  1:40 PM  Result Value Ref Range Status   SARS Coronavirus 2 NEGATIVE NEGATIVE Final    Comment: (NOTE) If result is NEGATIVE SARS-CoV-2 target nucleic acids are NOT DETECTED. The SARS-CoV-2 RNA is generally detectable in upper and lower  respiratory specimens during the acute phase of infection. The lowest  concentration of SARS-CoV-2 viral copies this assay can detect is 250  copies / mL. A negative result does not preclude SARS-CoV-2 infection  and should not be used as the sole basis for treatment or other  patient management decisions.  A negative result may occur with  improper specimen collection / handling, submission of specimen other  than nasopharyngeal swab, presence of viral mutation(s) within the  areas targeted by this assay, and inadequate number of viral copies  (<250 copies / mL). A negative result must be combined with clinical  observations, patient history, and epidemiological information. If result is POSITIVE SARS-CoV-2 target nucleic acids are DETECTED. The SARS-CoV-2 RNA is generally detectable in upper and lower  respiratory specimens dur ing the acute phase of infection.  Positive  results are indicative of active infection with SARS-CoV-2.  Clinical  correlation with patient history and other diagnostic information is  necessary to determine patient infection status.  Positive results do  not rule out bacterial infection or co-infection with other viruses. If result is PRESUMPTIVE POSTIVE SARS-CoV-2 nucleic acids MAY BE PRESENT.   A presumptive positive result was obtained on the submitted specimen  and confirmed on repeat testing.  While 2019 novel coronavirus  (SARS-CoV-2) nucleic acids may be present in the submitted sample  additional confirmatory testing may be necessary for epidemiological  and / or clinical management purposes  to differentiate between  SARS-CoV-2 and other Sarbecovirus currently known to infect humans.   If clinically indicated additional testing with an alternate test  methodology [phone]) is advised. The SARS-CoV-2 RNA is generally  detectable in upper and lower respiratory sp ecimens during the acute  phase of infection. The expected result is Negative. Fact Sheet for Patients:  StrictlyIdeas.no Fact Sheet for Healthcare Providers: BankingDealers.co.za This test is not yet approved or cleared by the [place] FDA and has been authorized for detection and/or diagnosis of SARS-CoV-2 by FDA under an Emergency Use Authorization (EUA).  This EUA will remain in effect (meaning this  test can be used) for the duration of the COVID-19 declaration under Section 564(b)(1) of the Act, 21 U.S.C. section 360bbb-3(b)(1), unless the authorization is terminated or revoked sooner. Performed at [facility], [place] [address] [place], [place] [address]      Radiological Exams on Admission: Dg Chest Portable 1 View  Result Date: 07/29/2018 CLINICAL DATA:  Chest pain short of breath EXAM: PORTABLE CHEST 1 VIEW COMPARISON:  04/28/2018 FINDINGS: Cardiac enlargement without heart failure. Lungs are now clear without infiltrate or effusion. Interval resolution of small effusions and right lower lobe airspace disease. IMPRESSION: No active disease. Electronically Signed   By: Franchot Gallo M.D.   On: 07/29/2018 11:56    EKG: Independently reviewed.  Normal sinus rhythm, no acute ST elevation  Assessment/Plan Active Problems:   Diabetes mellitus type 2, uncontrolled, with complications (HCC)   Stage 3 chronic kidney disease due to type 2 diabetes mellitus (HCC)   Acalculous cholecystitis   Acute on chronic diastolic CHF (congestive heart failure) (HCC)   Dyspnea   Dyspnea with exertion -Multifactorial in setting of chronic diastolic heart failure, suspected sleep apnea, ?PE  -Mildly elevated d-dimer, tachycardia, tender to palpation in the left  calf which is new per patient.  Check VQ scan, left lower extremity doppler to rule out PE and DVT  -Cardiology evaluated patient, patient is currently up about 11 pounds from her dry weight.  BNP 720.  Chest x-ray with no active disease. Continue daily weights, strict I's and O's. Continue Coreg  Acalculous cholecystitis -Cardiology following for preop cardiac evaluation; it was originally scheduled as an outpatient on 5/13.  Cardiology will obtain it while she is inpatient. NPO at midnight  -Recheck RUQ [place]. Depending on result and patient's symptoms, may require inpatient general surgery eval vs continued outpatient management. LFT unremarkable   CKD stage IV -Baseline creatinine 2-2.2 -Trend BMP while receiving IV diuretic  Suspected sleep apnea -Will need sleep study as an outpatient  Hyperlipidemia -Continue Lipitor  Hypertension -Continue Coreg, Catapres  Type 2 diabetes with neuropathy -Hold glipizide.  Continue Neurontin -Sliding scale insulin  GERD -Continue Protonix    DVT prophylaxis: Subcutaneous heparin Code Status: DO NOT INTUBATE. Discussed with patient at time of admission, she stated "If CPR doesn't work, let me go."  Family Communication: None Disposition Plan: Stress test tentatively planned for 5/13 Consults called: Cardiology Admission status: Observation  Severity of Illness: The appropriate patient status for this patient is OBSERVATION. Observation status is judged to be reasonable and necessary in order to provide the required intensity of service to ensure the patient's safety. The patient's presenting symptoms, physical exam findings, and initial radiographic and laboratory data in the context of their medical condition is felt to place them at decreased risk for further clinical deterioration. Furthermore, it is anticipated that the patient will be medically stable for discharge from the hospital within 2 midnights of admission. The following factors  support the patient status of observation.   " The patient's presenting symptoms include exertional dyspnea, right upper quadrant abdominal pain. " The physical exam findings include tenderness to palpation right upper quadrant, tenderness to palpation left calf. " The initial radiographic and laboratory data are difficult for mildly elevated troponin 0.03, mildly elevated BNP 720, creatinine 1.9.    Dessa Phi, DO [facility] 07/29/2018, 5:32 PM    How to contact the [facility] Attending or Consulting provider [place] or covering provider during after hours [place], for this patient?  1. Check the care team in [facility] and look for a) attending/consulting [facility] provider listed and b) the [facility] team listed 2. Log into [email or web address] and use [place]'s universal password to access. If you do not have the password, please contact the hospital operator. 3. Locate the [facility] provider you are looking for under [facility] and page to a number that you can be directly reached. 4. If you still have difficulty reaching the provider, please page the [facility] (Director on Call) for the Hospitalists listed on amion for assistance.

## 2018-07-30 ENCOUNTER — Observation Stay (HOSPITAL_BASED_OUTPATIENT_CLINIC_OR_DEPARTMENT_OTHER): Payer: PPO

## 2018-07-30 ENCOUNTER — Inpatient Hospital Stay (HOSPITAL_COMMUNITY): Admission: RE | Admit: 2018-07-30 | Payer: PPO | Source: Ambulatory Visit

## 2018-07-30 DIAGNOSIS — Z01818 Encounter for other preprocedural examination: Secondary | ICD-10-CM

## 2018-07-30 DIAGNOSIS — I249 Acute ischemic heart disease, unspecified: Secondary | ICD-10-CM | POA: Diagnosis not present

## 2018-07-30 DIAGNOSIS — N183 Chronic kidney disease, stage 3 (moderate): Secondary | ICD-10-CM

## 2018-07-30 DIAGNOSIS — R1011 Right upper quadrant pain: Secondary | ICD-10-CM | POA: Diagnosis not present

## 2018-07-30 DIAGNOSIS — I82409 Acute embolism and thrombosis of unspecified deep veins of unspecified lower extremity: Secondary | ICD-10-CM | POA: Diagnosis not present

## 2018-07-30 DIAGNOSIS — E1122 Type 2 diabetes mellitus with diabetic chronic kidney disease: Secondary | ICD-10-CM

## 2018-07-30 DIAGNOSIS — I5033 Acute on chronic diastolic (congestive) heart failure: Secondary | ICD-10-CM | POA: Diagnosis not present

## 2018-07-30 DIAGNOSIS — M25561 Pain in right knee: Secondary | ICD-10-CM | POA: Diagnosis not present

## 2018-07-30 DIAGNOSIS — Z9660 Presence of unspecified orthopedic joint implant: Secondary | ICD-10-CM | POA: Diagnosis not present

## 2018-07-30 DIAGNOSIS — Z96659 Presence of unspecified artificial knee joint: Secondary | ICD-10-CM | POA: Diagnosis not present

## 2018-07-30 DIAGNOSIS — R0609 Other forms of dyspnea: Secondary | ICD-10-CM | POA: Diagnosis not present

## 2018-07-30 DIAGNOSIS — K8020 Calculus of gallbladder without cholecystitis without obstruction: Secondary | ICD-10-CM | POA: Diagnosis not present

## 2018-07-30 DIAGNOSIS — I5021 Acute systolic (congestive) heart failure: Secondary | ICD-10-CM | POA: Diagnosis not present

## 2018-07-30 LAB — NM MYOCAR MULTI W/SPECT W/WALL MOTION / EF
Exercise duration (min): 5 min
MPHR: 154 {beats}/min
Peak HR: 94 {beats}/min
Percent HR: 61 %
Rest HR: 68 {beats}/min

## 2018-07-30 LAB — CBC
HCT: 28.5 % — ABNORMAL LOW (ref 36.0–46.0)
Hemoglobin: 9 g/dL — ABNORMAL LOW (ref 12.0–15.0)
MCH: 26.2 pg (ref 26.0–34.0)
MCHC: 31.6 g/dL (ref 30.0–36.0)
MCV: 83.1 fL (ref 80.0–100.0)
Platelets: 219 10*3/uL (ref 150–400)
RBC: 3.43 MIL/uL — ABNORMAL LOW (ref 3.87–5.11)
RDW: 14.5 % (ref 11.5–15.5)
WBC: 8.8 10*3/uL (ref 4.0–10.5)
nRBC: 0 % (ref 0.0–0.2)

## 2018-07-30 LAB — TROPONIN I
Troponin I: 0.03 ng/mL (ref <0.03)
Troponin I: <0.03 ng/mL (ref <0.03)

## 2018-07-30 LAB — BASIC METABOLIC PANEL
Anion gap: 12 (ref 5–15)
BUN: 15 mg/dL (ref 8–23)
CO2: 26 mmol/L (ref 22–32)
Calcium: 8.5 mg/dL — ABNORMAL LOW (ref 8.9–10.3)
Chloride: 104 mmol/L (ref 98–111)
Creatinine, Ser: 2.1 mg/dL — ABNORMAL HIGH (ref 0.44–1.00)
GFR calc Af Amer: 28 mL/min — ABNORMAL LOW (ref >60)
GFR calc non Af Amer: 24 mL/min — ABNORMAL LOW (ref >60)
Glucose, Bld: 154 mg/dL — ABNORMAL HIGH (ref 70–99)
Potassium: 3.9 mmol/L (ref 3.5–5.1)
Sodium: 142 mmol/L (ref 135–145)

## 2018-07-30 LAB — GLUCOSE, CAPILLARY: Glucose-Capillary: 223 mg/dL — ABNORMAL HIGH (ref 70–99)

## 2018-07-30 MED ORDER — REGADENOSON 0.4 MG/5ML IV SOLN
INTRAVENOUS | Status: AC
  Filled 2018-07-30: qty 5

## 2018-07-30 MED ORDER — TECHNETIUM TC 99M TETROFOSMIN IV KIT
10.0000 | PACK | Freq: Once | INTRAVENOUS | Status: AC | PRN
  Administered 2018-07-30: 08:00:00 10 via INTRAVENOUS

## 2018-07-30 MED ORDER — GUAIFENESIN-DM 100-10 MG/5ML PO SYRP
5.0000 mL | ORAL_SOLUTION | ORAL | Status: DC | PRN
  Administered 2018-07-30: 5 mL via ORAL
  Filled 2018-07-30: qty 5

## 2018-07-30 MED ORDER — TECHNETIUM TC 99M TETROFOSMIN IV KIT
30.0000 | PACK | Freq: Once | INTRAVENOUS | Status: AC | PRN
  Administered 2018-07-30: 10:00:00 30 via INTRAVENOUS

## 2018-07-30 MED ORDER — REGADENOSON 0.4 MG/5ML IV SOLN
0.4000 mg | Freq: Once | INTRAVENOUS | Status: AC
  Administered 2018-07-30: 0.4 mg via INTRAVENOUS
  Filled 2018-07-30: qty 5

## 2018-07-30 NOTE — Progress Notes (Addendum)
Progress Note  Patient Name: Zoe Clarke Date of Encounter: 07/30/2018  Primary Cardiologist: Skeet Latch, MD   Subjective   No chest pain overnight.   Inpatient Medications    Scheduled Meds:  atorvastatin  40 mg Oral Daily   carvedilol  3.125 mg Oral BID WC   cloNIDine  0.2 mg Oral TID   cyclobenzaprine  5 mg Oral QHS   gabapentin  300 mg Oral TID   heparin  5,000 Units Subcutaneous Q8H   insulin aspart  0-9 Units Subcutaneous TID WC   pantoprazole  40 mg Oral Daily   regadenoson  0.4 mg Intravenous Once   sodium chloride flush  3 mL Intravenous Q12H   Continuous Infusions:  sodium chloride     PRN Meds: sodium chloride, acetaminophen **OR** acetaminophen, labetalol, ondansetron **OR** ondansetron (ZOFRAN) IV, oxyCODONE-acetaminophen, polyethylene glycol, sodium chloride flush   Vital Signs    Vitals:   07/29/18 1948 07/29/18 2148 07/29/18 2357 07/30/18 0453  BP: (!) 188/81 (!) 187/72 (!) 153/63 100/88  Pulse:   90 97  Resp:      Temp:   98.9 F (37.2 C) 98.4 F (36.9 C)  TempSrc:   Oral Oral  SpO2:   93% 92%  Weight:    111.6 kg  Height:        Intake/Output Summary (Last 24 hours) at 07/30/2018 0855 Last data filed at 07/29/2018 2100 Gross per 24 hour  Intake 360 ml  Output --  Net 360 ml   Last 3 Weights 07/30/2018 07/29/2018 07/29/2018  Weight (lbs) 246 lb 0.5 oz 238 lb 241 lb  Weight (kg) 111.6 kg 107.956 kg 109.317 kg      Telemetry    SR - Personally Reviewed  ECG    SR - Personally Reviewed  Physical Exam   GEN: No acute distress.   Neck: No JVD Cardiac: RRR, no murmurs, rubs, or gallops.  Respiratory: Clear to auscultation bilaterally. GI: Soft, nontender, non-distended  MS: No edema; No deformity. Neuro:  Nonfocal  Psych: Normal affect   Labs    Chemistry Recent Labs  Lab 07/29/18 1141  NA 142  K 4.0  CL 109  CO2 23  GLUCOSE 176*  BUN 13  CREATININE 1.97*  CALCIUM 9.2  PROT 7.0  ALBUMIN 3.0*   AST 13*  ALT 12  ALKPHOS 105  BILITOT 0.8  GFRNONAA 26*  GFRAA 30*  ANIONGAP 10     Hematology Recent Labs  Lab 07/29/18 1141 07/30/18 0729  WBC 9.2 8.8  RBC 4.11 3.43*  HGB 11.0* 9.0*  HCT 34.5* 28.5*  MCV 83.9 83.1  MCH 26.8 26.2  MCHC 31.9 31.6  RDW 14.5 14.5  PLT 246 219    Cardiac Enzymes Recent Labs  Lab 07/29/18 1141 07/29/18 1936 07/30/18 0023  TROPONINI 0.03* 0.03* 0.03*   No results for input(s): TROPIPOC in the last 168 hours.   BNP Recent Labs  Lab 07/29/18 1142  BNP 720.1*     DDimer  Recent Labs  Lab 07/29/18 1141  DDIMER 0.56*     Radiology    Dg Chest Portable 1 View  Result Date: 07/29/2018 CLINICAL DATA:  Chest pain short of breath EXAM: PORTABLE CHEST 1 VIEW COMPARISON:  04/28/2018 FINDINGS: Cardiac enlargement without heart failure. Lungs are now clear without infiltrate or effusion. Interval resolution of small effusions and right lower lobe airspace disease. IMPRESSION: No active disease. Electronically Signed   By: Franchot Gallo M.D.   On:  07/29/2018 11:56   US Abdomen Limited Ruq  Result Date: 07/29/2018 CLINICAL DATA:  68 year old female with right upper quadrant abdominal pain. EXAM: ULTRASOUND ABDOMEN LIMITED RIGHT UPPER QUADRANT COMPARISON:  Right upper quadrant ultrasound dated 04/27/2018 FINDINGS: Gallbladder: There is layering sludge and stone within the gallbladder. No gallbladder wall thickening or pericholecystic fluid. Positive sonographic Murphy's sign. Common bile duct: Diameter: 4 mm Liver: There is enlargement of the left lobe of the liver. Portal vein is patent on color Doppler imaging with normal direction of blood flow towards the liver. IMPRESSION: Gallbladder sludge and small stones and positive sonographic Murphy's sign. No other sonographic findings of acute cholecystitis. A hepatobiliary scintigraphy may provide better evaluation of the gallbladder if there is a high clinical concern for acute cholecystitis .  Electronically Signed   By: Anner Crete M.D.   On: 07/29/2018 20:19    Cardiac Studies   N/a   Patient Profile     68 y.o. female with a hx of D-CHF, DM, HTN, CKD III, Asthma, GERD, who was being seen for the evaluation of CHF.   Assessment & Plan    1.  Acute on chronic diastolic CHF: According to patient, her dry weight is 238 pounds. - Chest x-ray was normal, but had mildly elevated BNP. Lasix held on admission. Weight today is 246. No shortness of breath.   2.  Stage III CKD: -Creatinine 1.97>>2.10. Currently holding diuresis.   3.  Palpitations: stable telemetry  4.  Possible sleep apnea: She snores and wakes up in the middle of the night with shortness of breath - would benefit from outpatient sleep study  5.  Preoperative cardiac evaluation: planned for myoview today. Images pending.   6. Acalculous cholecystitis: Abd [place] without acute cholecystitis. Further testing per primary.  For questions or updates, please contact [place] Please consult [email or web address] for contact info under   Signed, Reino Bellis, NP  07/30/2018, 8:55 AM    I have examined the patient and reviewed assessment and plan and discussed with patient.  Agree with above as stated.   Myoview negative for ischemia and infarct. EF low but was normal on echo a few months ago.  No further cardiac testing needed before surgery.    She is lying flat and breathing comfortably. OK to hold diuretic given increased Cr.   She would like to have her surgery before leaving the hospital.    Larae Grooms

## 2018-07-30 NOTE — Discharge Summary (Signed)
Physician Discharge Summary  Briseidy Spark MAU:[id number] DOB: 1950/11/19 DOA: 07/29/2018  PCP: Antony Contras, MD  Admit date: 07/29/2018 Discharge date: 07/30/2018  Admitted From: Home Disposition: Home  Recommendations for Outpatient Follow-up:  1. Follow up with PCP in 1 week 2. Follow up with Dr. Ninfa Linden for a  3. Please obtain BMP/CBC in one week 4. Please follow up on the following pending results: None  Home Health: None Equipment/Devices: None  Discharge Condition: Stable CODE STATUS: Partial (Do not intubate) Diet recommendation: Heart healthy   Brief/Interim Summary:  Admission HPI written by Dessa Phi, DO   Chief Complaint: Exertional dyspnea and wheezing  HPI: Zoe Clarke is a 68 y.o. female with medical history significant of chronic diastolic heart failure, CKD stage III, type 2 diabetes, hypertension who presents with chief complaint of exertional dyspnea and wheezing.  She is currently being evaluated by general surgery as well as cardiology; and currently awaiting for outpatient cholecystectomy and was being evaluated for outpatient preop cardiac clearance.  She complains of some exertional dyspnea as well as continued right upper quadrant abdominal pain.  Has some nausea as well.  She denies any fevers or chills.  Had productive cough of white sputum.  No chest pain or diarrhea.  ED Course: Labs include creatinine 1.97, BNP 720, troponin 0.03, d-dimer 0.56.  SARS-CoV-2 negative.  Chest x-ray without acute abnormality. Patient evaluated in the emergency department by cardiology team as well.    Hospital course:  Acute on chronic diastolic heart failure Dyspnea resolved completely today. Received Lasix IV x1. Troponin was elevated and d-dimer elevated, but within normal limits when adjusted for age; also complicated by renal disease. Cardiology consulted and patient underwent nuclear stress test which was low risk for ischemia. Resume home  regimen of Coreg and Lasix. Allergy to ACEi/ARB.  Symptomatic cholelithiasis Patient is followed outpatient. RUQ ultrasound significant for gallstones and sludge. No evidence of acute cholecystitis per general surgery assessment. General surgery recommending against surgery prior to discharge. Recommend outpatient follow-up with Dr. Ninfa Linden.  CKD stage IV Stable at baseline.  ?Sleep apnea Recommendation for outpatient sleep study  Hyperlipidemia Continue home Lipitor  Essential hypertension Continue home Coreg and clonidine  Diabetes mellitus w neuropathy Continue home regimen, including gabapentin. Caution with kidney disease.  GERD Continue home Protonix  Discharge Diagnoses:  Principal Problem:   Acute on chronic diastolic CHF (congestive heart failure) (HCC) Active Problems:   Diabetes mellitus type 2, uncontrolled, with complications (HCC)   Stage 3 chronic kidney disease due to type 2 diabetes mellitus (HCC)   Acalculous cholecystitis   Dyspnea   Symptomatic cholelithiasis    Discharge Instructions   Allergies as of 07/30/2018      Reactions   Aspirin Other (See Comments)   Feels funny    Allopurinol    Dizzy; hard to ambulate  Couldn't see; sweating   Amlodipine Other (See Comments)   headache   Lisinopril Swelling   Face swelling      Medication List    TAKE these medications   albuterol 108 (90 Base) MCG/ACT inhaler Commonly known as:  VENTOLIN HFA Inhale 2 puffs into the lungs every 6 (six) hours as needed for wheezing or shortness of breath.   atorvastatin 40 MG tablet Commonly known as:  LIPITOR Take 40 mg by mouth daily.   carvedilol 3.125 MG tablet Commonly known as:  COREG Take 1 tablet (3.125 mg total) by mouth 2 (two) times daily with a meal.  cloNIDine 0.2 MG tablet Commonly known as:  CATAPRES Take 1 tablet (0.2 mg total) by mouth 3 (three) times daily.   cyclobenzaprine 5 MG tablet Commonly known as:  FLEXERIL Take 5 mg by  mouth at bedtime.   fexofenadine 180 MG tablet Commonly known as:  ALLEGRA Take 180 mg by mouth 2 (two) times a day.   furosemide 20 MG tablet Commonly known as:  Lasix Take 1 tablet (20 mg total) by mouth daily.   gabapentin 300 MG capsule Commonly known as:  NEURONTIN Take 300 mg by mouth 3 (three) times daily.   glipiZIDE 10 MG tablet Commonly known as:  GLUCOTROL Take 1 tablet (10 mg total) by mouth 2 (two) times daily before a meal.   Linzess 145 MCG Caps capsule Generic drug:  linaclotide Take 145 mcg by mouth as needed (constipation).   oxyCODONE-acetaminophen 5-325 MG tablet Commonly known as:  PERCOCET/ROXICET Take by mouth as needed for severe pain (every 6-8 hours as needed).   pantoprazole 40 MG tablet Commonly known as:  PROTONIX Take 1 tablet (40 mg total) by mouth daily.   polyethylene glycol 17 g packet Commonly known as:  MIRALAX / GLYCOLAX Take 17 g by mouth daily as needed for mild constipation.      Follow-up Information    Coralie Keens, MD. Call.   Specialty:  General Surgery Why:  To schedule an appointment for follow up Contact information: [address] [place] [address] [phone]          Allergies  Allergen Reactions   Aspirin Other (See Comments)    Feels funny    Allopurinol     Dizzy; hard to ambulate  Couldn't see; sweating   Amlodipine Other (See Comments)    headache   Lisinopril Swelling    Face swelling    Consultations:  General surgery   Procedures/Studies: Nm Myocar Multi W/spect W/wall Motion / Ef  Result Date: 07/30/2018 CLINICAL DATA:  Preoperative examination. Possible acute coronary syndrome. History of CHF, hypertension, hyperlipidemia, diabetes and stage 3 kidney disease. EXAM: MYOCARDIAL IMAGING WITH SPECT (REST AND PHARMACOLOGIC-STRESS) GATED LEFT VENTRICULAR WALL MOTION STUDY LEFT VENTRICULAR EJECTION FRACTION TECHNIQUE: Standard myocardial SPECT imaging was performed after  resting intravenous injection of 10 mCi Tc-37m tetrofosmin. Subsequently, intravenous infusion of Lexiscan was performed under the supervision of the Cardiology staff. At peak effect of the drug, 30 mCi Tc-39m tetrofosmin was injected intravenously and standard myocardial SPECT imaging was performed. Quantitative gated imaging was also performed to evaluate left ventricular wall motion, and estimate left ventricular ejection fraction. COMPARISON:  Chest radiograph-07/29/2018 FINDINGS: Raw images: Moderate breast and chest wall attenuation is seen on both provided rest stress images. Mild GI attenuation is seen on both provided rest stress images. There is no significant patient motion artifact. Perfusion: Attenuation involving the inferior wall the left ventricle improves a nearly resolves on provided stress images. There is matched attenuation involving the anterior wall the left ventricle without regional wall motion abnormality. No definitive scintigraphic evidence of prior infarction or pharmacologically induced ischemia. Wall Motion: Left ventricle is severely dilated with global left ventricular hypokinesia. Left Ventricular Ejection Fraction: 42 % End diastolic volume 638 ml End systolic volume 85 ml IMPRESSION: 1. no definitive scintigraphic evidence of prior infarction pharmacologically induced ischemia. 2. Severe left ventricular dilatation. 3. Global left ventricular hypokinesia. Left ventricular ejection fraction-42% Electronically Signed   By: Sandi Mariscal M.D.   On: 07/30/2018 12:35   Dg Chest Portable  1 View  Result Date: 07/29/2018 CLINICAL DATA:  Chest pain short of breath EXAM: PORTABLE CHEST 1 VIEW COMPARISON:  04/28/2018 FINDINGS: Cardiac enlargement without heart failure. Lungs are now clear without infiltrate or effusion. Interval resolution of small effusions and right lower lobe airspace disease. IMPRESSION: No active disease. Electronically Signed   By: Franchot Gallo M.D.   On:  07/29/2018 11:56   Vas [place] Lower Extremity Venous (dvt)  Result Date: 07/30/2018  Lower Venous Study Indications: Pain, and in left calf.  Comparison Study: No priors. Performing Technologist: Oda Cogan RDMS, RVT  Examination Guidelines: A complete evaluation includes B-mode imaging, spectral Doppler, color Doppler, and power Doppler as needed of all accessible portions of each vessel. Bilateral testing is considered an integral part of a complete examination. Limited examinations for reoccurring indications may be performed as noted.  +-----+---------------+---------+-----------+----------+-------+  RIGHT Compressibility Phasicity Spontaneity Properties Summary  +-----+---------------+---------+-----------+----------+-------+  CFV   Full            Yes       Yes                             +-----+---------------+---------+-----------+----------+-------+  SFJ   Full                                                      +-----+---------------+---------+-----------+----------+-------+   +---------+---------------+---------+-----------+----------+-------+  LEFT      Compressibility Phasicity Spontaneity Properties Summary  +---------+---------------+---------+-----------+----------+-------+  CFV       Full            Yes       Yes                             +---------+---------------+---------+-----------+----------+-------+  SFJ       Full                                                      +---------+---------------+---------+-----------+----------+-------+  FV Prox   Full                                                      +---------+---------------+---------+-----------+----------+-------+  FV Mid    Full                                                      +---------+---------------+---------+-----------+----------+-------+  FV Distal Full                                                      +---------+---------------+---------+-----------+----------+-------+  PFV       Full                                                       +---------+---------------+---------+-----------+----------+-------+  POP       Full            Yes       Yes                             +---------+---------------+---------+-----------+----------+-------+  PTV       Full                                                      +---------+---------------+---------+-----------+----------+-------+  PERO      Full                                                      +---------+---------------+---------+-----------+----------+-------+     Summary: Right: No evidence of common femoral vein obstruction. Left: There is no evidence of deep vein thrombosis in the lower extremity. No cystic structure found in the popliteal fossa.  *See table(s) above for measurements and observations. Electronically signed by Harold Barban MD on 07/30/2018 at 3:32:21 PM.    Final    US Abdomen Limited Ruq  Result Date: 07/29/2018 CLINICAL DATA:  68 year old female with right upper quadrant abdominal pain. EXAM: ULTRASOUND ABDOMEN LIMITED RIGHT UPPER QUADRANT COMPARISON:  Right upper quadrant ultrasound dated 04/27/2018 FINDINGS: Gallbladder: There is layering sludge and stone within the gallbladder. No gallbladder wall thickening or pericholecystic fluid. Positive sonographic Murphy's sign. Common bile duct: Diameter: 4 mm Liver: There is enlargement of the left lobe of the liver. Portal vein is patent on color Doppler imaging with normal direction of blood flow towards the liver. IMPRESSION: Gallbladder sludge and small stones and positive sonographic Murphy's sign. No other sonographic findings of acute cholecystitis. A hepatobiliary scintigraphy may provide better evaluation of the gallbladder if there is a high clinical concern for acute cholecystitis . Electronically Signed   By: Anner Crete M.D.   On: 07/29/2018 20:19      Subjective: No chest pain or dyspnea. Some RUQ pain today.  Discharge Exam: Vitals:   07/30/18 0931 07/30/18 0934  BP: (!)  146/84 137/72  Pulse:    Resp:    Temp:    SpO2:     Vitals:   07/30/18 0453 07/30/18 0908 07/30/18 0931 07/30/18 0934  BP: 100/88 (!) 154/78 (!) 146/84 137/72  Pulse: 97     Resp:      Temp: 98.4 F (36.9 C)     TempSrc: Oral     SpO2: 92%     Weight: 111.6 kg     Height:        General: Pt is alert, awake, not in acute distress Cardiovascular: RRR, S1/S2 +, no rubs, no gallops Respiratory: CTA bilaterally, no wheezing, no rhonchi Abdominal: Soft, RUQ tenderness, ND, bowel sounds + Extremities: no edema, no cyanosis    The results of significant diagnostics from this hospitalization (including imaging, microbiology, ancillary and laboratory) are listed below for reference.     Microbiology: Recent Results (from the past 240 hour(s))  SARS Coronavirus 2 (CEPHEID- Performed in [facility] hospital lab), Hosp Order     Status: None   Collection Time: 07/29/18  1:40 PM  Result Value Ref Range Status   SARS Coronavirus 2 NEGATIVE NEGATIVE Final    Comment: (NOTE) If result is NEGATIVE SARS-CoV-2 target nucleic acids are NOT DETECTED. The SARS-CoV-2 RNA is generally detectable in upper and lower  respiratory specimens during the acute phase of infection. The lowest  concentration of SARS-CoV-2 viral copies this assay can detect is 250  copies / mL. A negative result does not preclude SARS-CoV-2 infection  and should not be used as the sole basis for treatment or other  patient management decisions.  A negative result may occur with  improper specimen collection / handling, submission of specimen other  than nasopharyngeal swab, presence of viral mutation(s) within the  areas targeted by this assay, and inadequate number of viral copies  (<250 copies / mL). A negative result must be combined with clinical  observations, patient history, and epidemiological information. If result is POSITIVE SARS-CoV-2 target nucleic acids are DETECTED. The SARS-CoV-2 RNA is generally  detectable in upper and lower  respiratory specimens dur ing the acute phase of infection.  Positive  results are indicative of active infection with SARS-CoV-2.  Clinical  correlation with patient history and other diagnostic information is  necessary to determine patient infection status.  Positive results do  not rule out bacterial infection or co-infection with other viruses. If result is PRESUMPTIVE POSTIVE SARS-CoV-2 nucleic acids MAY BE PRESENT.   A presumptive positive result was obtained on the submitted specimen  and confirmed on repeat testing.  While 2019 novel coronavirus  (SARS-CoV-2) nucleic acids may be present in the submitted sample  additional confirmatory testing may be necessary for epidemiological  and / or clinical management purposes  to differentiate between  SARS-CoV-2 and other Sarbecovirus currently known to infect humans.  If clinically indicated additional testing with an alternate test  methodology [phone]) is advised. The SARS-CoV-2 RNA is generally  detectable in upper and lower respiratory sp ecimens during the acute  phase of infection. The expected result is Negative. Fact Sheet for Patients:  StrictlyIdeas.no Fact Sheet for Healthcare Providers: BankingDealers.co.za This test is not yet approved or cleared by the [place] FDA and has been authorized for detection and/or diagnosis of SARS-CoV-2 by FDA under an Emergency Use Authorization (EUA).  This EUA will remain in effect (meaning this test can be used) for the duration of the COVID-19 declaration under Section 564(b)(1) of the Act, 21 U.S.C. section 360bbb-3(b)(1), unless the authorization is terminated or revoked sooner. Performed at [facility], [place] [address] [place], [place] [address]      Labs: BNP (last 3 results) Recent Labs    04/27/18 0940 04/28/18 0223 07/29/18 1142  BNP 630.3* 451.4* 425.9*   Basic Metabolic  Panel: Recent Labs  Lab 07/29/18 1141 07/30/18 0729  NA 142 142  K 4.0 3.9  CL 109 104  CO2 23 26  GLUCOSE 176* 154*  BUN 13 15  CREATININE 1.97* 2.10*  CALCIUM 9.2 8.5*   Liver Function Tests: Recent Labs  Lab 07/29/18 1141  AST 13*  ALT 12  ALKPHOS 105  BILITOT 0.8  PROT 7.0  ALBUMIN 3.0*   No results for input(s): LIPASE, AMYLASE in the last 168 hours. No results for input(s): AMMONIA in the last 168 hours. CBC: Recent Labs  Lab 07/29/18 1141 07/30/18 0729  WBC 9.2 8.8  NEUTROABS 6.3  --   HGB 11.0* 9.0*  HCT 34.5* 28.5*  MCV 83.9 83.1  PLT 246 219   Cardiac Enzymes:  Recent Labs  Lab 07/29/18 1141 07/29/18 1936 07/30/18 0023 07/30/18 0729  TROPONINI 0.03* 0.03* 0.03* <0.03   BNP: Invalid input(s): POCBNP CBG: Recent Labs  Lab 07/29/18 2124 07/30/18 1231  GLUCAP 168* 223*   D-Dimer Recent Labs    07/29/18 1141  DDIMER 0.56*   Hgb A1c No results for input(s): HGBA1C in the last 72 hours. Lipid Profile No results for input(s): CHOL, HDL, LDLCALC, TRIG, CHOLHDL, LDLDIRECT in the last 72 hours. Thyroid function studies No results for input(s): TSH, T4TOTAL, T3FREE, THYROIDAB in the last 72 hours.  Invalid input(s): FREET3 Anemia work up No results for input(s): VITAMINB12, FOLATE, FERRITIN, TIBC, IRON, RETICCTPCT in the last 72 hours. Urinalysis    Component Value Date/Time   COLORURINE YELLOW 04/27/2018 1334   APPEARANCEUR CLEAR 04/27/2018 1334   LABSPEC 1.012 04/27/2018 1334   PHURINE 7.0 04/27/2018 1334   GLUCOSEU 50 (A) 04/27/2018 1334   HGBUR SMALL (A) 04/27/2018 1334   BILIRUBINUR NEGATIVE 04/27/2018 1334   BILIRUBINUR negative 02/25/2015 1426   KETONESUR NEGATIVE 04/27/2018 1334   PROTEINUR 100 (A) 04/27/2018 1334   UROBILINOGEN 0.2 02/25/2015 1426   UROBILINOGEN 1.0 04/16/2009 0943   NITRITE NEGATIVE 04/27/2018 1334   LEUKOCYTESUR NEGATIVE 04/27/2018 1334   Sepsis Labs Invalid input(s): PROCALCITONIN,  WBC,   LACTICIDVEN Microbiology Recent Results (from the past 240 hour(s))  SARS Coronavirus 2 (CEPHEID- Performed in [place] hospital lab), Hosp Order     Status: None   Collection Time: 07/29/18  1:40 PM  Result Value Ref Range Status   SARS Coronavirus 2 NEGATIVE NEGATIVE Final    Comment: (NOTE) If result is NEGATIVE SARS-CoV-2 target nucleic acids are NOT DETECTED. The SARS-CoV-2 RNA is generally detectable in upper and lower  respiratory specimens during the acute phase of infection. The lowest  concentration of SARS-CoV-2 viral copies this assay can detect is 250  copies / mL. A negative result does not preclude SARS-CoV-2 infection  and should not be used as the sole basis for treatment or other  patient management decisions.  A negative result may occur with  improper specimen collection / handling, submission of specimen other  than nasopharyngeal swab, presence of viral mutation(s) within the  areas targeted by this assay, and inadequate number of viral copies  (<250 copies / mL). A negative result must be combined with clinical  observations, patient history, and epidemiological information. If result is POSITIVE SARS-CoV-2 target nucleic acids are DETECTED. The SARS-CoV-2 RNA is generally detectable in upper and lower  respiratory specimens dur ing the acute phase of infection.  Positive  results are indicative of active infection with SARS-CoV-2.  Clinical  correlation with patient history and other diagnostic information is  necessary to determine patient infection status.  Positive results do  not rule out bacterial infection or co-infection with other viruses. If result is PRESUMPTIVE POSTIVE SARS-CoV-2 nucleic acids MAY BE PRESENT.   A presumptive positive result was obtained on the submitted specimen  and confirmed on repeat testing.  While 2019 novel coronavirus  (SARS-CoV-2) nucleic acids may be present in the submitted sample  additional confirmatory testing may  be necessary for epidemiological  and / or clinical management purposes  to differentiate between  SARS-CoV-2 and other Sarbecovirus currently known to infect humans.  If clinically indicated additional testing with an alternate test  methodology [phone]) is advised. The SARS-CoV-2 RNA is generally  detectable in upper and lower respiratory sp ecimens during the acute  phase of infection. The  expected result is Negative. Fact Sheet for Patients:  StrictlyIdeas.no Fact Sheet for Healthcare Providers: BankingDealers.co.za This test is not yet approved or cleared by the [place] FDA and has been authorized for detection and/or diagnosis of SARS-CoV-2 by FDA under an Emergency Use Authorization (EUA).  This EUA will remain in effect (meaning this test can be used) for the duration of the COVID-19 declaration under Section 564(b)(1) of the Act, 21 U.S.C. section 360bbb-3(b)(1), unless the authorization is terminated or revoked sooner. Performed at [facility], [place] [address], [place], [place] [address]     SIGNED:   Cordelia Poche, MD [facility] 07/30/2018, 4:45 PM

## 2018-07-30 NOTE — Progress Notes (Signed)
Left lower ext. Venous has been completed. Refer to [facility] under chart review to view preliminary results.   07/30/2018  12:15 PM Jihaad Bruschi, Bonnye Fava

## 2018-07-30 NOTE — Discharge Instructions (Signed)
Zoe Clarke,  You were in the hospital because of some trouble breathing. This seems to have resolved with some Lasix. You had a stress test done which was low risk for ischemia (damge of the heart cells). With regard to your gallbladder, it was recommended that you follow-up with Dr. Ninfa Linden as an outpatient to schedule surgery at a later time.

## 2018-07-30 NOTE — Consult Note (Addendum)
[facility] Consult Note  Zoe Clarke December 13, 1950  [id number].    Requesting MD: Zoe Clarke Chief Complaint/Reason for Consult: gallstones  HPI:  Zoe Clarke is a 68yo female PMH HTN, HLD, chronic diastolic heart failure, CKD stage III, T2DM, asthma, GERD, and obesity who was admitted to [facility] yesterday complaining of worsening exertional dyspnea and wheezing.  Patient was seeing Dr. Moreen Clarke in outpatient office who called EMS when she developed pressure in her chest. Troponin 0.03 initially and has trended down; D-dimer 0.56; BNP 720.1; SARS coronavirus negative; WBC 9.2; No LFT elevation; CXR without acute findings.  Cardiology consulted for acute on chronic diastolic CHF and she was diuresed with lasix. She underwent stress test today and cardiology recommendations are pending.   General surgery asked to see regarding gallstones. Patient was admitted in January 2020 with RUQ pain. We were consulted at that time and HIDA showed no signs of cholecystitis, but she did have signs/symptoms of biliary dyskinesia with gallbladder ejection fraction of 18%. Zoe Clarke followed up in our office with Dr. Ninfa Clarke and was planning for elective cholecystectomy pending cardiac clearance.   The patient reports pain in her RUQ that has wax/waned over the last 1 week and does not radiate. She reports that her pain feels like a "knot" in her RUQ that she rates as a 5/10, but when it worsens it feels sharp and can be up to a 9/10. Her pain has worsened with movement onto her right side, palpation and more recently after standing following her stress test. When her pain worsens, it lasts for ~1-2 minutes before improving. She is currently eating a chicken salad sandwich and chips and notes that food does not increase her pain. She is unsure if her symptoms have ever been related to eating. She has had a slightly decreased appetite. She notes that she does have intermittent nausea but it is  not always associated with changes in pain. She had an episodes of emesis earlier this week but none in the last few days. She reports her Oxycodone will help "dull" the pain but it does not completely go away. Her pain has become more controlled since admission. She reports this is a similar pain that she had in January of 2020 but worse. She notes her SOB have improved. Pain was not related to exertion and she denies current chest pain.   Abdominal surgical history: tubal ligation Anticoagulants: none Smokes: 43 pack year hx. Currently 1/3 PPD No Alcohol use Notes Marijuina use. No other illicit drug use.  Employment: Retired Lives at home with husband, Zoe Clarke  ROS: Review of Systems  Constitutional: Negative for chills and fever.  Gastrointestinal: Positive for abdominal pain, constipation (Last BM Thursday), nausea and vomiting. Negative for blood in stool, diarrhea and melena.  Genitourinary: Positive for dysuria (reports ongoing dysuria over the last several weeks), frequency (related to lasix) and urgency (related to lasix).  All other systems reviewed and are negative.  All systems reviewed and otherwise negative except for as above  Family History  Problem Relation Age of Onset  . Arthritis Mother   . Hypertension Mother   . Heart failure Mother   . Emphysema Mother   . Arthritis Maternal Grandmother   . Hypertension Maternal Grandmother   . Stroke Maternal Grandfather   . Cancer Sister        unknown type cancer  . Heart failure Sister   . Lung disease Sister   . Heart failure Sister   .  Colon cancer Neg Hx   . Esophageal cancer Neg Hx   . Stomach cancer Neg Hx   . Rectal cancer Neg Hx     Past Medical History:  Diagnosis Date  . Allergy   . Arthritis   . Asthma   . Chronic diastolic CHF (congestive heart failure) ([place]) 04/2018  . CKD (chronic kidney disease), stage III ([place])   . Diabetes mellitus without complication ([place])   . Gastric ulcer   . GERD  (gastroesophageal reflux disease)   . Hypertension   . Osteoporosis     Past Surgical History:  Procedure Laterality Date  . TUBAL LIGATION      Social History:  reports that she has been smoking cigarettes. She has a 19.50 pack-year smoking history. She has never used smokeless tobacco. She reports current drug use. Drug: Marijuana. She reports that she does not drink alcohol.  Allergies:  Allergies  Allergen Reactions  . Aspirin Other (See Comments)    Feels funny   . Allopurinol     Dizzy; hard to ambulate  Couldn't see; sweating  . Amlodipine Other (See Comments)    headache  . Lisinopril Swelling    Face swelling    Medications Prior to Admission  Medication Sig Dispense Refill  . albuterol (PROVENTIL HFA;VENTOLIN HFA) 108 (90 Base) MCG/ACT inhaler Inhale 2 puffs into the lungs every 6 (six) hours as needed for wheezing or shortness of breath. 1 Inhaler 0  . atorvastatin (LIPITOR) 40 MG tablet Take 40 mg by mouth daily.    . carvedilol (COREG) 3.125 MG tablet Take 1 tablet (3.125 mg total) by mouth 2 (two) times daily with a meal. 60 tablet 11  . cloNIDine (CATAPRES) 0.2 MG tablet Take 1 tablet (0.2 mg total) by mouth 3 (three) times daily. 90 tablet 11  . cyclobenzaprine (FLEXERIL) 5 MG tablet Take 5 mg by mouth at bedtime.    . fexofenadine (ALLEGRA) 180 MG tablet Take 180 mg by mouth 2 (two) times a day.     . furosemide (LASIX) 20 MG tablet Take 1 tablet (20 mg total) by mouth daily. 30 tablet 11  . gabapentin (NEURONTIN) 300 MG capsule Take 300 mg by mouth 3 (three) times daily.     Marland Kitchen glipiZIDE (GLUCOTROL) 10 MG tablet Take 1 tablet (10 mg total) by mouth 2 (two) times daily before a meal. 180 tablet 0  . linaclotide (LINZESS) 145 MCG CAPS capsule Take 145 mcg by mouth as needed (constipation).    Marland Kitchen oxyCODONE-acetaminophen (PERCOCET/ROXICET) 5-325 MG tablet Take by mouth as needed for severe pain (every 6-8 hours as needed).    . pantoprazole (PROTONIX) 40 MG tablet  Take 1 tablet (40 mg total) by mouth daily. 30 tablet 11  . polyethylene glycol (MIRALAX / GLYCOLAX) packet Take 17 g by mouth daily as needed for mild constipation. 14 each 0    Prior to Admission medications   Medication Sig Start Date End Date Taking? Authorizing Provider  albuterol (PROVENTIL HFA;VENTOLIN HFA) 108 (90 Base) MCG/ACT inhaler Inhale 2 puffs into the lungs every 6 (six) hours as needed for wheezing or shortness of breath. 04/14/18  Yes Bonnell Public, MD  atorvastatin (LIPITOR) 40 MG tablet Take 40 mg by mouth daily.   Yes [provider]  carvedilol (COREG) 3.125 MG tablet Take 1 tablet (3.125 mg total) by mouth 2 (two) times daily with a meal. 07/18/18  Yes Skeet Latch, MD  cloNIDine (CATAPRES) 0.2 MG tablet Take 1 tablet (  0.2 mg total) by mouth 3 (three) times daily. 07/18/18  Yes Skeet Latch, MD  cyclobenzaprine (FLEXERIL) 5 MG tablet Take 5 mg by mouth at bedtime.   Yes [provider]  fexofenadine (ALLEGRA) 180 MG tablet Take 180 mg by mouth 2 (two) times a day.    Yes [provider]  furosemide (LASIX) 20 MG tablet Take 1 tablet (20 mg total) by mouth daily. 07/18/18  Yes Skeet Latch, MD  gabapentin (NEURONTIN) 300 MG capsule Take 300 mg by mouth 3 (three) times daily.    Yes [provider]  glipiZIDE (GLUCOTROL) 10 MG tablet Take 1 tablet (10 mg total) by mouth 2 (two) times daily before a meal. 08/27/15  Yes Weber, Damaris Hippo, PA-C  linaclotide (LINZESS) 145 MCG CAPS capsule Take 145 mcg by mouth as needed (constipation).   Yes [provider]  oxyCODONE-acetaminophen (PERCOCET/ROXICET) 5-325 MG tablet Take by mouth as needed for severe pain (every 6-8 hours as needed).   Yes [provider]  pantoprazole (PROTONIX) 40 MG tablet Take 1 tablet (40 mg total) by mouth daily. 07/14/18  Yes Skeet Latch, MD  polyethylene glycol [facility] / [name] packet Take 17 g by mouth daily as needed for mild  constipation. 04/14/18  Yes Dana Allan I, MD    Blood pressure 137/72, pulse 97, temperature 98.4 F (36.9 C), temperature source Oral, resp. rate 18, height 5\' 8"  (1.727 m), weight 111.6 kg, SpO2 92 %. Physical Exam: General: pleasant, WD obese AA female who is laying in bed in NAD HEENT: head is normocephalic, atraumatic.  Sclera are noninjected.  Pupils equal and round.  Ears and nose without any masses or lesions.  Mouth is pink and moist. Dentition poor Heart: regular, rate, and rhythm.  No obvious murmurs, gallops, or rubs noted.  Palpable pedal pulses bilaterally Lungs: distant breath sounds at bases. No wheezes, rhonchi, or rales noted.  Respiratory effort nonlabored Abd: Soft, obese, ND. Minimal tenderness to RUQ. Negative Murphy's sign. No r/r/g or signs of peritonitis. No palpable GB. +BS, no masses, hernias, or organomegaly MS: all 4 extremities are symmetrical with no cyanosis, clubbing. Trace pedal edema. Skin: warm and dry with no masses, lesions, or rashes Psych: A&Ox3 with an appropriate affect. Neuro: cranial nerves grossly intact, extremity CSM intact bilaterally, normal speech  Results for orders placed or performed during the hospital encounter of 07/29/18 (from the past 48 hour(s))  Basic metabolic panel     Status: Abnormal   Collection Time: 07/29/18 11:41 AM  Result Value Ref Range   Sodium 142 135 - 145 mmol/L   Potassium 4.0 3.5 - 5.1 mmol/L   Chloride 109 98 - 111 mmol/L   CO2 23 22 - 32 mmol/L   Glucose, Bld 176 (H) 70 - 99 mg/dL   BUN 13 8 - 23 mg/dL   Creatinine, Ser 1.97 (H) 0.44 - 1.00 mg/dL   Calcium 9.2 8.9 - 10.3 mg/dL   GFR calc non Af Amer 26 (L) >60 mL/min   GFR calc Af Amer 30 (L) >60 mL/min   Anion gap 10 5 - 15    Comment: Performed at [facility], [address], [place], [place] [address]  CBC with Differential     Status: Abnormal   Collection Time: 07/29/18 11:41 AM  Result Value Ref Range   WBC 9.2 4.0 - 10.5 K/uL   RBC  4.11 3.87 - 5.11 MIL/uL   Hemoglobin 11.0 (L) 12.0 - 15.0 g/dL  HCT 34.5 (L) 36.0 - 46.0 %   MCV 83.9 80.0 - 100.0 fL   MCH 26.8 26.0 - 34.0 pg   MCHC 31.9 30.0 - 36.0 g/dL   RDW 14.5 11.5 - 15.5 %   Platelets 246 150 - 400 K/uL   nRBC 0.0 0.0 - 0.2 %   Neutrophils Relative % 70 %   Neutro Abs 6.3 1.7 - 7.7 K/uL   Lymphocytes Relative 22 %   Lymphs Abs 2.0 0.7 - 4.0 K/uL   Monocytes Relative 6 %   Monocytes Absolute 0.6 0.1 - 1.0 K/uL   Eosinophils Relative 2 %   Eosinophils Absolute 0.2 0.0 - 0.5 K/uL   Basophils Relative 0 %   Basophils Absolute 0.0 0.0 - 0.1 K/uL   Immature Granulocytes 0 %   Abs Immature Granulocytes 0.03 0.00 - 0.07 K/uL    Comment: Performed at [facility] [address], [place], [place] [address]  Troponin I - Once     Status: Abnormal   Collection Time: 07/29/18 11:41 AM  Result Value Ref Range   Troponin I 0.03 (HH) <0.03 ng/mL    Comment: CRITICAL RESULT CALLED TO, READ BACK BY AND VERIFIED WITH: T HAMILTON RN AT [phone] ON [id number] BY K FORSYTH Performed at [facility], [place] [address], [place], [place] [address]   Hepatic function panel     Status: Abnormal   Collection Time: 07/29/18 11:41 AM  Result Value Ref Range   Total Protein 7.0 6.5 - 8.1 g/dL   Albumin 3.0 (L) 3.5 - 5.0 g/dL   AST 13 (L) 15 - 41 U/L   ALT 12 0 - 44 U/L   Alkaline Phosphatase 105 38 - 126 U/L   Total Bilirubin 0.8 0.3 - 1.2 mg/dL   Bilirubin, Direct 0.2 0.0 - 0.2 mg/dL   Indirect Bilirubin 0.6 0.3 - 0.9 mg/dL    Comment: Performed at [facility] [address], [place], [place] [address]  D-dimer, quantitative (not at [facility])     Status: Abnormal   Collection Time: 07/29/18 11:41 AM  Result Value Ref Range   D-Dimer, Quant 0.56 (H) 0.00 - 0.50 ug/mL-FEU    Comment: (NOTE) At the manufacturer cut-off of 0.50 ug/mL FEU, this assay has been documented to exclude PE with a sensitivity and negative predictive value of 97 to 99%.  At this time, this  assay has not been approved by the FDA to exclude DVT/VTE. Results should be correlated with clinical presentation. Performed at [facility], [place] [address], [place], [place] [address]   Brain natriuretic peptide     Status: Abnormal   Collection Time: 07/29/18 11:42 AM  Result Value Ref Range   B Natriuretic Peptide 720.1 (H) 0.0 - 100.0 pg/mL    Comment: Performed at [facility] [address], [place], [place] [address]  SARS Coronavirus 2 (CEPHEID- Performed in [facility] hospital lab), Hosp Order     Status: None   Collection Time: 07/29/18  1:40 PM  Result Value Ref Range   SARS Coronavirus 2 NEGATIVE NEGATIVE    Comment: (NOTE) If result is NEGATIVE SARS-CoV-2 target nucleic acids are NOT DETECTED. The SARS-CoV-2 RNA is generally detectable in upper and lower  respiratory specimens during the acute phase of infection. The lowest  concentration of SARS-CoV-2 viral copies this assay can detect is 250  copies / mL. A negative result does not preclude SARS-CoV-2 infection  and should  not be used as the sole basis for treatment or other  patient management decisions.  A negative result may occur with  improper specimen collection / handling, submission of specimen other  than nasopharyngeal swab, presence of viral mutation(s) within the  areas targeted by this assay, and inadequate number of viral copies  (<250 copies / mL). A negative result must be combined with clinical  observations, patient history, and epidemiological information. If result is POSITIVE SARS-CoV-2 target nucleic acids are DETECTED. The SARS-CoV-2 RNA is generally detectable in upper and lower  respiratory specimens dur ing the acute phase of infection.  Positive  results are indicative of active infection with SARS-CoV-2.  Clinical  correlation with patient history and other diagnostic information is  necessary to determine patient infection status.  Positive results do  not rule out  bacterial infection or co-infection with other viruses. If result is PRESUMPTIVE POSTIVE SARS-CoV-2 nucleic acids MAY BE PRESENT.   A presumptive positive result was obtained on the submitted specimen  and confirmed on repeat testing.  While 2019 novel coronavirus  (SARS-CoV-2) nucleic acids may be present in the submitted sample  additional confirmatory testing may be necessary for epidemiological  and / or clinical management purposes  to differentiate between  SARS-CoV-2 and other Sarbecovirus currently known to infect humans.  If clinically indicated additional testing with an alternate test  methodology [phone]) is advised. The SARS-CoV-2 RNA is generally  detectable in upper and lower respiratory sp ecimens during the acute  phase of infection. The expected result is Negative. Fact Sheet for Patients:  StrictlyIdeas.no Fact Sheet for Healthcare Providers: BankingDealers.co.za This test is not yet approved or cleared by the [place] FDA and has been authorized for detection and/or diagnosis of SARS-CoV-2 by FDA under an Emergency Use Authorization (EUA).  This EUA will remain in effect (meaning this test can be used) for the duration of the COVID-19 declaration under Section 564(b)(1) of the Act, 21 U.S.C. section 360bbb-3(b)(1), unless the authorization is terminated or revoked sooner. Performed at [facility], [place] [address] [place], [place] [address]   Troponin I - ONCE - STAT     Status: Abnormal   Collection Time: 07/29/18  7:36 PM  Result Value Ref Range   Troponin I 0.03 (HH) <0.03 ng/mL    Comment: CRITICAL VALUE NOTED.  VALUE IS CONSISTENT WITH PREVIOUSLY REPORTED AND CALLED VALUE. Performed at [facility], [place] [address] [place], [place] [address]   Glucose, capillary     Status: Abnormal   Collection Time: 07/29/18  9:24 PM  Result Value Ref Range   Glucose-Capillary 168 (H) 70 - 99 mg/dL   Troponin I - Now Then Q6H     Status: Abnormal   Collection Time: 07/30/18 12:23 AM  Result Value Ref Range   Troponin I 0.03 (HH) <0.03 ng/mL    Comment: CRITICAL VALUE NOTED.  VALUE IS CONSISTENT WITH PREVIOUSLY REPORTED AND CALLED VALUE. Performed at [facility], [place] [address] [place], [place] [address]   CBC     Status: Abnormal   Collection Time: 07/30/18  7:29 AM  Result Value Ref Range   WBC 8.8 4.0 - 10.5 K/uL   RBC 3.43 (L) 3.87 - 5.11 MIL/uL   Hemoglobin 9.0 (L) 12.0 - 15.0 g/dL   HCT 28.5 (L) 36.0 - 46.0 %   MCV 83.1 80.0 - 100.0 fL   MCH 26.2 26.0 - 34.0 pg   MCHC 31.6 30.0 -  36.0 g/dL   RDW 14.5 11.5 - 15.5 %   Platelets 219 150 - 400 K/uL   nRBC 0.0 0.0 - 0.2 %    Comment: Performed at [facility], [place] [address] [place], [place] [address]  Basic metabolic panel     Status: Abnormal   Collection Time: 07/30/18  7:29 AM  Result Value Ref Range   Sodium 142 135 - 145 mmol/L   Potassium 3.9 3.5 - 5.1 mmol/L   Chloride 104 98 - 111 mmol/L   CO2 26 22 - 32 mmol/L   Glucose, Bld 154 (H) 70 - 99 mg/dL   BUN 15 8 - 23 mg/dL   Creatinine, Ser 2.10 (H) 0.44 - 1.00 mg/dL   Calcium 8.5 (L) 8.9 - 10.3 mg/dL   GFR calc non Af Amer 24 (L) >60 mL/min   GFR calc Af Amer 28 (L) >60 mL/min   Anion gap 12 5 - 15    Comment: Performed at [facility], [place] [address] [place], [place] [address]  Troponin I - Now Then Q6H     Status: None   Collection Time: 07/30/18  7:29 AM  Result Value Ref Range   Troponin I <0.03 <0.03 ng/mL    Comment: Performed at [facility] [address], [place], [place] [address]  Glucose, capillary     Status: Abnormal   Collection Time: 07/30/18 12:31 PM  Result Value Ref Range   Glucose-Capillary 223 (H) 70 - 99 mg/dL   Nm Myocar Multi W/spect W/wall Motion / Ef  Result Date: 07/30/2018 CLINICAL DATA:  Preoperative examination. Possible acute coronary syndrome. History of CHF, hypertension, hyperlipidemia, diabetes  and stage 3 kidney disease. EXAM: MYOCARDIAL IMAGING WITH SPECT (REST AND PHARMACOLOGIC-STRESS) GATED LEFT VENTRICULAR WALL MOTION STUDY LEFT VENTRICULAR EJECTION FRACTION TECHNIQUE: Standard myocardial SPECT imaging was performed after resting intravenous injection of 10 mCi Tc-74m tetrofosmin. Subsequently, intravenous infusion of Lexiscan was performed under the supervision of the Cardiology staff. At peak effect of the drug, 30 mCi Tc-57m tetrofosmin was injected intravenously and standard myocardial SPECT imaging was performed. Quantitative gated imaging was also performed to evaluate left ventricular wall motion, and estimate left ventricular ejection fraction. COMPARISON:  Chest radiograph-07/29/2018 FINDINGS: Raw images: Moderate breast and chest wall attenuation is seen on both provided rest stress images. Mild GI attenuation is seen on both provided rest stress images. There is no significant patient motion artifact. Perfusion: Attenuation involving the inferior wall the left ventricle improves a nearly resolves on provided stress images. There is matched attenuation involving the anterior wall the left ventricle without regional wall motion abnormality. No definitive scintigraphic evidence of prior infarction or pharmacologically induced ischemia. Wall Motion: Left ventricle is severely dilated with global left ventricular hypokinesia. Left Ventricular Ejection Fraction: 42 % End diastolic volume 213 ml End systolic volume 85 ml IMPRESSION: 1. no definitive scintigraphic evidence of prior infarction pharmacologically induced ischemia. 2. Severe left ventricular dilatation. 3. Global left ventricular hypokinesia. Left ventricular ejection fraction-42% Electronically Signed   By: Sandi Mariscal M.D.   On: 07/30/2018 12:35   Dg Chest Portable 1 View  Result Date: 07/29/2018 CLINICAL DATA:  Chest pain short of breath EXAM: PORTABLE CHEST 1 VIEW COMPARISON:  04/28/2018 FINDINGS: Cardiac enlargement without  heart failure. Lungs are now clear without infiltrate or effusion. Interval resolution of small effusions and right lower lobe airspace disease. IMPRESSION: No active disease. Electronically Signed   By: Franchot Gallo M.D.  On: 07/29/2018 11:56   Vas [place] Lower Extremity Venous (dvt)  Result Date: 07/30/2018  Lower Venous Study Indications: Pain, and in left calf.  Comparison Study: No priors. Performing Technologist: Oda Cogan RDMS, RVT  Examination Guidelines: A complete evaluation includes B-mode imaging, spectral Doppler, color Doppler, and power Doppler as needed of all accessible portions of each vessel. Bilateral testing is considered an integral part of a complete examination. Limited examinations for reoccurring indications may be performed as noted.  +-----+---------------+---------+-----------+----------+-------+ RIGHTCompressibilityPhasicitySpontaneityPropertiesSummary +-----+---------------+---------+-----------+----------+-------+ CFV  Full           Yes      Yes                          +-----+---------------+---------+-----------+----------+-------+ SFJ  Full                                                 +-----+---------------+---------+-----------+----------+-------+   +---------+---------------+---------+-----------+----------+-------+ LEFT     CompressibilityPhasicitySpontaneityPropertiesSummary +---------+---------------+---------+-----------+----------+-------+ CFV      Full           Yes      Yes                          +---------+---------------+---------+-----------+----------+-------+ SFJ      Full                                                 +---------+---------------+---------+-----------+----------+-------+ FV Prox  Full                                                 +---------+---------------+---------+-----------+----------+-------+ FV Mid   Full                                                  +---------+---------------+---------+-----------+----------+-------+ FV DistalFull                                                 +---------+---------------+---------+-----------+----------+-------+ PFV      Full                                                 +---------+---------------+---------+-----------+----------+-------+ POP      Full           Yes      Yes                          +---------+---------------+---------+-----------+----------+-------+ PTV      Full                                                 +---------+---------------+---------+-----------+----------+-------+  PERO     Full                                                 +---------+---------------+---------+-----------+----------+-------+     Summary: Right: No evidence of common femoral vein obstruction. Left: There is no evidence of deep vein thrombosis in the lower extremity. No cystic structure found in the popliteal fossa.  *See table(s) above for measurements and observations.    Preliminary    US Abdomen Limited Ruq  Result Date: 07/29/2018 CLINICAL DATA:  68 year old female with right upper quadrant abdominal pain. EXAM: ULTRASOUND ABDOMEN LIMITED RIGHT UPPER QUADRANT COMPARISON:  Right upper quadrant ultrasound dated 04/27/2018 FINDINGS: Gallbladder: There is layering sludge and stone within the gallbladder. No gallbladder wall thickening or pericholecystic fluid. Positive sonographic Murphy's sign. Common bile duct: Diameter: 4 mm Liver: There is enlargement of the left lobe of the liver. Portal vein is patent on color Doppler imaging with normal direction of blood flow towards the liver. IMPRESSION: Gallbladder sludge and small stones and positive sonographic Murphy's sign. No other sonographic findings of acute cholecystitis. A hepatobiliary scintigraphy may provide better evaluation of the gallbladder if there is a high clinical concern for acute cholecystitis . Electronically Signed   By:  Anner Crete M.D.   On: 07/29/2018 20:19   Anti-infectives (From admission, onward)   None        Assessment/Plan HTN HLD Chronic diastolic heart failure CKD stage III T2DM Asthma GERD Obesity  RUQ pain Gallstones Biliary dyskinesia Symptomatic Cholelithiasis  - HIDA 1/24 suggestive of biliary dyskinesia with EF of 18% - Was following with Dr. Ninfa Clarke as outpatient who recommended cardiac and medical clearance prior to plans for elective cholecystectomy  - [place] w/ gallbladder sludge and small stones. No gallbladder wall thickening or pericholecystic fluid. Normal WBC. No LFT elevation. She is tolerating diet without any increase in pain. Minimal pain on abdominal exam without Murphy's sign or peritonitis. No current concerns for acute cholecystitis. Would not recommend emergent cholecystectomy while inpatient at this time. Would recommend follow up as outpatient with [facility] after patient has recovered from acute respiratory condition and obtains medical and cardiac clearance. We will sign off at this time. Please call back for any changes or concerns.    ID - none VTE - SCDs, sq heparin FEN - HH/CM diet Foley - none Follow up - TBD  Jillyn Ledger, [facility] 07/30/2018, 2:40 PM Pager: [phone]

## 2018-07-30 NOTE — Care Management Obs Status (Signed)
[facility] NOTIFICATION   Patient Details  Name: Zoe Clarke MRN: [id number] Date of Birth: [birth year]   Medicare Observation Status Notification Given:  Yes    Bethena Roys, RN 07/30/2018, 3:52 PM

## 2018-07-31 ENCOUNTER — Telehealth: Payer: Self-pay | Admitting: Cardiovascular Disease

## 2018-07-31 NOTE — Telephone Encounter (Signed)
Follow Up: ° ° ° °Returning your call from yesterday. °

## 2018-07-31 NOTE — Telephone Encounter (Signed)
Patient wanted to let Dr Oval Linsey know that she had a stress test done at [facility] on 07/30/18

## 2018-07-31 NOTE — Telephone Encounter (Signed)
Left message to call back  

## 2018-08-01 NOTE — Telephone Encounter (Signed)
Routed to Dr. Oval Linsey, [facility] LPN Not sure if this needs to go back to pre-op pool?

## 2018-08-04 NOTE — Telephone Encounter (Signed)
Cancel outpatient stress order.  She is at acceptable risk for surgery.

## 2018-08-04 NOTE — Progress Notes (Signed)
Virtual Visit via Telephone Note   This visit type was conducted due to national recommendations for restrictions regarding the COVID-19 Pandemic (e.g. social distancing) in an effort to limit this patient's exposure and mitigate transmission in our community.  Due to her co-morbid illnesses, this patient is at least at moderate risk for complications without adequate follow up.  This format is felt to be most appropriate for this patient at this time.  The patient did not have access to video technology/had technical difficulties with video requiring transitioning to audio format only (telephone).  All issues noted in this document were discussed and addressed.  No physical exam could be performed with this format.  Please refer to the patient's chart for her  consent to telehealth for [facility].   Date:  08/06/2018   ID:  Zoe Clarke, DOB [birth year], MRN [id number]  Patient Location: Home Provider Location: Home  PCP:  Antony Contras, MD  Cardiologist:  Skeet Latch, MD  Electrophysiologist:  None   Evaluation Performed:  Follow-Up Visit  Chief Complaint:  Follow up hospitalization   History of Present Illness:    Zoe Clarke is a 68 y.o. female we are seeing posthospitalization after admission for acute on chronic diastolic heart failure, dry weight is 238 pounds, other history includes hypertension, chronic kidney disease stage III, diabetes, asthma, and GERD.  The patient was given IV diuretics however the patient did have increased creatinine from 1.97-2.10.  Diuretics were held after initial IV Lasix in the setting of elevated BNP.  She was also diagnosed with possible sleep apnea and was recommended to have an outpatient sleep study.  During hospitalization the patient had a Myoview stress test which was negative for ischemia or infarct.  She was also to be evaluated due to abdominal pain with abdominal ultrasound significant for gallstones and sludge but no  evidence of acute cholecystitis.  She was not recommended for surgery during hospitalization per surgical consultation.  She was to have outpatient follow-up.  The patient was to resume home regimen of Coreg and Lasix, she is allergic to ACE and ARB.  She therefore remained on clonidine 0.2 mg 3 times daily.  She was tested for COVID-19 and was negative while hospitalized.   She is doing very well today. No complaints of chest pain, DOE, or fatigue. She is anxious to have cholecystectomy with Dr. Ninfa Linden of [facility]. Date is to be determined due to backlog from [place] restrictions on elective surgery.  She is medically compliant and has no issues getting her medications. She states that her BP is up and down depending on her pain.   The patient does not have symptoms concerning for COVID-19 infection (fever, chills, cough, or new shortness of breath).    Past Medical History:  Diagnosis Date  . Allergy   . Arthritis   . Asthma   . Chronic diastolic CHF (congestive heart failure) ([place]) 04/2018  . CKD (chronic kidney disease), stage III ([place])   . Diabetes mellitus without complication ([place])   . Gastric ulcer   . GERD (gastroesophageal reflux disease)   . Hypertension   . Osteoporosis    Past Surgical History:  Procedure Laterality Date  . TUBAL LIGATION       Current Meds  Medication Sig  . albuterol (PROVENTIL HFA;VENTOLIN HFA) 108 (90 Base) MCG/ACT inhaler Inhale 2 puffs into the lungs every 6 (six) hours as needed for wheezing or shortness of breath.  Marland Kitchen atorvastatin (LIPITOR) 40  MG tablet Take 40 mg by mouth daily.  . carvedilol (COREG) 3.125 MG tablet Take 1 tablet (3.125 mg total) by mouth 2 (two) times daily with a meal.  . cloNIDine (CATAPRES) 0.2 MG tablet Take 1 tablet (0.2 mg total) by mouth 3 (three) times daily.  . cyclobenzaprine (FLEXERIL) 5 MG tablet Take 5 mg by mouth at bedtime.  . fexofenadine (ALLEGRA) 180 MG tablet Take 180 mg by mouth 2 (two) times  a day.   . furosemide (LASIX) 20 MG tablet Take 1 tablet (20 mg total) by mouth daily.  Marland Kitchen gabapentin (NEURONTIN) 300 MG capsule Take 300 mg by mouth 3 (three) times daily.   Marland Kitchen glipiZIDE (GLUCOTROL) 10 MG tablet Take 1 tablet (10 mg total) by mouth 2 (two) times daily before a meal.  . linaclotide (LINZESS) 145 MCG CAPS capsule Take 145 mcg by mouth as needed (constipation).  Marland Kitchen oxyCODONE-acetaminophen (PERCOCET/ROXICET) 5-325 MG tablet Take by mouth as needed for severe pain (every 6-8 hours as needed).  . pantoprazole (PROTONIX) 40 MG tablet Take 1 tablet (40 mg total) by mouth daily.  . polyethylene glycol (MIRALAX / GLYCOLAX) packet Take 17 g by mouth daily as needed for mild constipation.     Allergies:   Aspirin; Allopurinol; Amlodipine; and Lisinopril   Social History   Tobacco Use  . Smoking status: Current Every Day Smoker    Packs/day: 0.50    Years: 39.00    Pack years: 19.50    Types: Cigarettes  . Smokeless tobacco: Never Used  . Tobacco comment: she used to smoke 2 packs a day   Substance Use Topics  . Alcohol use: No    Alcohol/week: 0.0 standard drinks  . Drug use: Yes    Types: Marijuana     Family Hx: The patient's family history includes Arthritis in her maternal grandmother and mother; Cancer in her sister; Emphysema in her mother; Heart failure in her mother, sister, and sister; Hypertension in her maternal grandmother and mother; Lung disease in her sister; Stroke in her maternal grandfather. There is no history of Colon cancer, Esophageal cancer, Stomach cancer, or Rectal cancer.  ROS:   Please see the history of present illness.    All other systems reviewed and are negative.   Prior CV studies:   The following studies were reviewed today:  Echo 04/28/18: IMPRESSIONS 1. The left ventricle has normal systolic function of 89-38%. The cavity size was normal. There is mildly increased left ventricular wall thickness. Echo evidence of impaired diastolic  relaxation Elevated mean left atrial pressure. 2. The right ventricle has normal systolic function. The cavity was normal. There is no increase in right ventricular wall thickness. 3. Left atrial size was mildly dilated. 4. The mitral valve is normal in structure. There is mild thickening. 5. The tricuspid valve is normal in structure. 6. The aortic valve is normal in structure. Aortic valve regurgitation is mild by color flow Doppler. 7. The inferior vena cava was dilated in size with >50% respiratory variability.  Stress Myoview 07/30/2018 IMPRESSION: 1. no definitive scintigraphic evidence of prior infarction pharmacologically induced ischemia. 2. Severe left ventricular dilatation. 3. Global left ventricular hypokinesia. Left ventricular ejection fraction-42%  Labs/Other Tests and Data Reviewed:    EKG:  No ECG reviewed.  Recent Labs: 04/27/2018: Magnesium 2.0; TSH 3.871 07/29/2018: ALT 12; B Natriuretic Peptide 720.1 07/30/2018: BUN 15; Creatinine, Ser 2.10; Hemoglobin 9.0; Platelets 219; Potassium 3.9; Sodium 142   Recent Lipid Panel Lab Results  Component  Value Date/Time   CHOL 149 08/25/2015 03:45 PM   TRIG 90 08/25/2015 03:45 PM   HDL 47 08/25/2015 03:45 PM   CHOLHDL 3.2 08/25/2015 03:45 PM   LDLCALC 84 08/25/2015 03:45 PM    Wt Readings from Last 3 Encounters:  08/06/18 238 lb (108 kg)  07/30/18 246 lb 0.5 oz (111.6 kg)  05/01/18 246 lb 3.2 oz (111.7 kg)     Objective:    Vital Signs:  Ht 5\' 8"  (1.727 m)   Wt 238 lb (108 kg)   BMI 36.19 kg/m    GEN:  no acute distress RESPIRATORY:  normal respiratory effort, symmetric expansion NEURO:  alert and oriented x 3, no obvious focal deficit PSYCH:  normal affect  ASSESSMENT & PLAN:    1. Chest Pain: Echocardiogram and NM stress test are normal. She is doing well and does not require any new testing from CV standpoint.   2. Hypertension: Discussed the importance of medical compliance, especially with  clonidine. She verbalizes understanding.   3. Pre-Operative Evaluation:   Chart reviewed as part of pre-operative protocol coverage. Given past medical history and time since last visit, based on ACC/AHA guidelines, Zoe Clarke would be at acceptable risk for the planned procedure without further cardiovascular testing.    COVID-19 Education: The signs and symptoms of COVID-19 were discussed with the patient and how to seek care for testing (follow up with PCP or arrange E-visit).  The importance of social distancing was discussed today.  Time:   Today, I have spent 15  minutes with the patient with telehealth technology discussing the above problems.     Medication Adjustments/Labs and Tests Ordered: Current medicines are reviewed at length with the patient today.  Concerns regarding medicines are outlined above.   Tests Ordered: No orders of the defined types were placed in this encounter.   Medication Changes: No orders of the defined types were placed in this encounter.   Disposition:  Follow up 3 months   Signed, Phill Myron. [place] Pugh, ANP, AACC  08/06/2018 9:44 AM    [facility]

## 2018-08-05 ENCOUNTER — Telehealth: Payer: Self-pay | Admitting: Adult Health

## 2018-08-05 ENCOUNTER — Encounter: Payer: Self-pay | Admitting: *Deleted

## 2018-08-05 NOTE — Telephone Encounter (Signed)
Clearance letter sent to Zoe Clarke at [phone]

## 2018-08-05 NOTE — Telephone Encounter (Signed)
Smartphone/ consent/ my chart declined/ pre reg completed

## 2018-08-06 ENCOUNTER — Telehealth (INDEPENDENT_AMBULATORY_CARE_PROVIDER_SITE_OTHER): Payer: PPO | Admitting: Adult Health

## 2018-08-06 ENCOUNTER — Encounter: Payer: Self-pay | Admitting: Adult Health

## 2018-08-06 VITALS — Ht 68.0 in | Wt 238.0 lb

## 2018-08-06 DIAGNOSIS — I10 Essential (primary) hypertension: Secondary | ICD-10-CM

## 2018-08-06 DIAGNOSIS — Z01818 Encounter for other preprocedural examination: Secondary | ICD-10-CM

## 2018-08-06 DIAGNOSIS — R0789 Other chest pain: Secondary | ICD-10-CM

## 2018-08-06 NOTE — Patient Instructions (Signed)
Medication Instructions:  The current medical regimen is effective;  continue present plan and medications.  If you need a refill on your cardiac medications before your next appointment, please call your pharmacy.   Follow-Up: At [facility], you and your health needs are our priority.  As part of our continuing mission to provide you with exceptional heart care, we have created designated Provider Care Teams.  These Care Teams include your primary Cardiologist (physician) and Advanced Practice Providers (APPs -  Physician Assistants and Nurse Practitioners) who all work together to provide you with the care you need, when you need it. You will need a follow up appointment in 3 months.  Please call our office 2 months in advance to schedule this appointment.  You may see Skeet Latch, MD or one of the following Advanced Practice Providers on your designated Care Team:   Kerin Ransom, PA-C Roby Lofts, [place] . Sande Rives, PA-C

## 2018-08-07 ENCOUNTER — Telehealth: Payer: PPO | Admitting: Cardiovascular Disease

## 2018-08-07 ENCOUNTER — Telehealth: Payer: Self-pay | Admitting: Cardiovascular Disease

## 2018-08-07 NOTE — Telephone Encounter (Signed)
Refaxed to [facility]

## 2018-08-07 NOTE — Telephone Encounter (Signed)
New Message    Pt is calling and said Dr Adela Ports office didn't get the Fax and they would like it re faxed   Please call

## 2018-08-07 NOTE — Telephone Encounter (Signed)
Follow up:   Patient calling she has a proc done and she needed the letter sent to Dr. Ninfa Linden Office. Please call patient back concering this letter.

## 2018-08-07 NOTE — Telephone Encounter (Signed)
Left message on voicemail - letter was sent to Dr Trevor Mace office on 08/05/18 for clearance for surgery  any question may call back .  re route letter and routed office note from 08/06/18 to dr blackman's office.

## 2018-08-12 ENCOUNTER — Telehealth: Payer: Self-pay | Admitting: Cardiovascular Disease

## 2018-08-14 DIAGNOSIS — I5022 Chronic systolic (congestive) heart failure: Secondary | ICD-10-CM | POA: Diagnosis not present

## 2018-08-14 DIAGNOSIS — M62830 Muscle spasm of back: Secondary | ICD-10-CM | POA: Diagnosis not present

## 2018-08-14 DIAGNOSIS — E7800 Pure hypercholesterolemia, unspecified: Secondary | ICD-10-CM | POA: Diagnosis not present

## 2018-08-14 DIAGNOSIS — K838 Other specified diseases of biliary tract: Secondary | ICD-10-CM | POA: Diagnosis not present

## 2018-08-14 DIAGNOSIS — Z0000 Encounter for general adult medical examination without abnormal findings: Secondary | ICD-10-CM | POA: Diagnosis not present

## 2018-08-14 DIAGNOSIS — Z1389 Encounter for screening for other disorder: Secondary | ICD-10-CM | POA: Diagnosis not present

## 2018-08-14 DIAGNOSIS — E1169 Type 2 diabetes mellitus with other specified complication: Secondary | ICD-10-CM | POA: Diagnosis not present

## 2018-08-14 DIAGNOSIS — D649 Anemia, unspecified: Secondary | ICD-10-CM | POA: Diagnosis not present

## 2018-08-14 DIAGNOSIS — N183 Chronic kidney disease, stage 3 (moderate): Secondary | ICD-10-CM | POA: Diagnosis not present

## 2018-08-14 DIAGNOSIS — M25552 Pain in left hip: Secondary | ICD-10-CM | POA: Diagnosis not present

## 2018-08-14 DIAGNOSIS — I10 Essential (primary) hypertension: Secondary | ICD-10-CM | POA: Diagnosis not present

## 2018-08-14 DIAGNOSIS — R05 Cough: Secondary | ICD-10-CM | POA: Diagnosis not present

## 2018-08-18 ENCOUNTER — Other Ambulatory Visit: Payer: Self-pay | Admitting: Cardiovascular Disease

## 2018-08-18 DIAGNOSIS — I10 Essential (primary) hypertension: Secondary | ICD-10-CM

## 2018-08-18 DIAGNOSIS — R0602 Shortness of breath: Secondary | ICD-10-CM

## 2018-08-18 DIAGNOSIS — R0683 Snoring: Secondary | ICD-10-CM

## 2018-08-18 NOTE — Telephone Encounter (Signed)
-----   Message from Earvin Hansen, LPN sent at 2/90/3795 10:08 AM EDT ----- HEY Kasey Hansell PATIENT NEEDS SLEEP STUDY  THANKS MELINDA

## 2018-08-25 DIAGNOSIS — E118 Type 2 diabetes mellitus with unspecified complications: Secondary | ICD-10-CM | POA: Diagnosis not present

## 2018-08-25 DIAGNOSIS — N183 Chronic kidney disease, stage 3 (moderate): Secondary | ICD-10-CM | POA: Diagnosis not present

## 2018-08-25 DIAGNOSIS — E559 Vitamin D deficiency, unspecified: Secondary | ICD-10-CM | POA: Diagnosis not present

## 2018-08-25 DIAGNOSIS — Z7189 Other specified counseling: Secondary | ICD-10-CM | POA: Diagnosis not present

## 2018-08-25 DIAGNOSIS — E049 Nontoxic goiter, unspecified: Secondary | ICD-10-CM | POA: Diagnosis not present

## 2018-08-25 DIAGNOSIS — Z720 Tobacco use: Secondary | ICD-10-CM | POA: Diagnosis not present

## 2018-08-25 DIAGNOSIS — R1310 Dysphagia, unspecified: Secondary | ICD-10-CM | POA: Diagnosis not present

## 2018-08-25 DIAGNOSIS — E1165 Type 2 diabetes mellitus with hyperglycemia: Secondary | ICD-10-CM | POA: Diagnosis not present

## 2018-08-25 DIAGNOSIS — I10 Essential (primary) hypertension: Secondary | ICD-10-CM | POA: Diagnosis not present

## 2018-08-25 DIAGNOSIS — E785 Hyperlipidemia, unspecified: Secondary | ICD-10-CM | POA: Diagnosis not present

## 2018-08-25 DIAGNOSIS — I509 Heart failure, unspecified: Secondary | ICD-10-CM | POA: Diagnosis not present

## 2018-08-25 DIAGNOSIS — Z6841 Body Mass Index (BMI) 40.0 and over, adult: Secondary | ICD-10-CM | POA: Diagnosis not present

## 2018-08-27 ENCOUNTER — Other Ambulatory Visit: Payer: Self-pay | Admitting: Internal Medicine

## 2018-08-27 DIAGNOSIS — E049 Nontoxic goiter, unspecified: Secondary | ICD-10-CM

## 2018-08-27 DIAGNOSIS — R1310 Dysphagia, unspecified: Secondary | ICD-10-CM

## 2018-08-28 DIAGNOSIS — M21612 Bunion of left foot: Secondary | ICD-10-CM | POA: Diagnosis not present

## 2018-08-28 DIAGNOSIS — L84 Corns and callosities: Secondary | ICD-10-CM | POA: Diagnosis not present

## 2018-08-30 DIAGNOSIS — Z96659 Presence of unspecified artificial knee joint: Secondary | ICD-10-CM | POA: Diagnosis not present

## 2018-08-30 DIAGNOSIS — I5021 Acute systolic (congestive) heart failure: Secondary | ICD-10-CM | POA: Diagnosis not present

## 2018-08-30 DIAGNOSIS — Z9660 Presence of unspecified orthopedic joint implant: Secondary | ICD-10-CM | POA: Diagnosis not present

## 2018-08-30 DIAGNOSIS — M25561 Pain in right knee: Secondary | ICD-10-CM | POA: Diagnosis not present

## 2018-09-08 ENCOUNTER — Ambulatory Visit
Admission: RE | Admit: 2018-09-08 | Discharge: 2018-09-08 | Disposition: A | Discharge status: Home / Self Care | Payer: PPO | Source: Ambulatory Visit | Attending: Internal Medicine | Admitting: Internal Medicine

## 2018-09-08 DIAGNOSIS — R600 Localized edema: Secondary | ICD-10-CM | POA: Diagnosis not present

## 2018-09-08 DIAGNOSIS — I5032 Chronic diastolic (congestive) heart failure: Secondary | ICD-10-CM | POA: Diagnosis not present

## 2018-09-08 DIAGNOSIS — R1310 Dysphagia, unspecified: Secondary | ICD-10-CM

## 2018-09-08 DIAGNOSIS — D649 Anemia, unspecified: Secondary | ICD-10-CM | POA: Diagnosis not present

## 2018-09-08 DIAGNOSIS — E042 Nontoxic multinodular goiter: Secondary | ICD-10-CM | POA: Diagnosis not present

## 2018-09-08 DIAGNOSIS — R0601 Orthopnea: Secondary | ICD-10-CM | POA: Diagnosis not present

## 2018-09-08 DIAGNOSIS — E7800 Pure hypercholesterolemia, unspecified: Secondary | ICD-10-CM | POA: Diagnosis not present

## 2018-09-08 DIAGNOSIS — E049 Nontoxic goiter, unspecified: Secondary | ICD-10-CM

## 2018-09-13 ENCOUNTER — Encounter (HOSPITAL_BASED_OUTPATIENT_CLINIC_OR_DEPARTMENT_OTHER): Payer: PPO

## 2018-09-15 ENCOUNTER — Other Ambulatory Visit: Payer: Self-pay | Admitting: Cardiovascular Disease

## 2018-09-15 DIAGNOSIS — R0602 Shortness of breath: Secondary | ICD-10-CM

## 2018-09-15 DIAGNOSIS — R0683 Snoring: Secondary | ICD-10-CM

## 2018-09-15 DIAGNOSIS — I5033 Acute on chronic diastolic (congestive) heart failure: Secondary | ICD-10-CM

## 2018-09-15 DIAGNOSIS — I509 Heart failure, unspecified: Secondary | ICD-10-CM | POA: Diagnosis not present

## 2018-09-15 DIAGNOSIS — E559 Vitamin D deficiency, unspecified: Secondary | ICD-10-CM | POA: Diagnosis not present

## 2018-09-15 DIAGNOSIS — I10 Essential (primary) hypertension: Secondary | ICD-10-CM

## 2018-09-15 DIAGNOSIS — E785 Hyperlipidemia, unspecified: Secondary | ICD-10-CM | POA: Diagnosis not present

## 2018-09-15 DIAGNOSIS — Z720 Tobacco use: Secondary | ICD-10-CM | POA: Diagnosis not present

## 2018-09-15 DIAGNOSIS — E1165 Type 2 diabetes mellitus with hyperglycemia: Secondary | ICD-10-CM | POA: Diagnosis not present

## 2018-09-15 DIAGNOSIS — E118 Type 2 diabetes mellitus with unspecified complications: Secondary | ICD-10-CM | POA: Diagnosis not present

## 2018-09-15 DIAGNOSIS — Z7189 Other specified counseling: Secondary | ICD-10-CM | POA: Diagnosis not present

## 2018-09-15 DIAGNOSIS — R1310 Dysphagia, unspecified: Secondary | ICD-10-CM | POA: Diagnosis not present

## 2018-09-15 DIAGNOSIS — E049 Nontoxic goiter, unspecified: Secondary | ICD-10-CM | POA: Diagnosis not present

## 2018-09-15 DIAGNOSIS — N183 Chronic kidney disease, stage 3 (moderate): Secondary | ICD-10-CM | POA: Diagnosis not present

## 2018-09-15 DIAGNOSIS — Z6841 Body Mass Index (BMI) 40.0 and over, adult: Secondary | ICD-10-CM | POA: Diagnosis not present

## 2018-09-16 NOTE — Pre-Procedure Instructions (Signed)
Zoe Clarke  09/16/2018      [place], [place] [address] Phone: [phone] Fax: [phone]    Your procedure is scheduled on September 24, 2018.  Report to [facility] Entrance "A" at 945 AM.  Call this number if you have problems the morning of surgery:  [phone]   Call [phone] if you have any questions prior to your surgery date Monday-Friday 8am-4pm    Remember:  Do not eat  after midnight.  You may drink clear liquids until 8;45 AM.  Clear liquids allowed are:                    Water, Juice (non-citric and without pulp), Clear Tea, Black Coffee only and Gatorade    Take these medicines the morning of surgery with A SIP OF WATER  carvedilol (COREG) cloNIDine (CATAPRES) fexofenadine (ALLEGRA)  gabapentin (NEURONTIN) pantoprazole (PROTONIX)  linaclotide (LINZESS) -if needed Oxycodone-acetaminophen-percocet-if needed albuterol (PROVENTIL HFA;VENTOLIN HFA)  MCG/ACT inhaler-if needed -bring with you  7 days prior to surgery STOP taking any Aspirin (unless otherwise instructed by your surgeon), Aleve, Naproxen, Ibuprofen, Motrin, Advil, Goody's, BC's, all herbal medications, fish oil, and all vitamins  Bring inhalers with you  WHAT DO I DO ABOUT MY DIABETES MEDICATION?  Marland Kitchen Do not take oral diabetes medicines (pills) the morning of surgery- Glipizide (Glucotrol)  THE NIGHT BEFORE SURGERY, DO NOT take evening dose of Glipizidie (Glucotrol XL).  Reviewed and Endorsed by [facility] Patient Education Committee, August 2015  How to Manage Your Diabetes Before and After Surgery  Why is it important to control my blood sugar before and after surgery? . Improving blood sugar levels before and after surgery helps healing and can limit problems. . A way of improving blood sugar control is eating a healthy diet by: o  Eating less sugar and carbohydrates o  Increasing activity/exercise o   Talking with your doctor about reaching your blood sugar goals . High blood sugars (greater than 180 mg/dL) can raise your risk of infections and slow your recovery, so you will need to focus on controlling your diabetes during the weeks before surgery. . Make sure that the doctor who takes care of your diabetes knows about your planned surgery including the date and location.  How do I manage my blood sugar before surgery? . Check your blood sugar at least 4 times a day, starting 2 days before surgery, to make sure that the level is not too high or low. o Check your blood sugar the morning of your surgery when you wake up and every 2 hours until you get to the Short Stay unit. . If your blood sugar is less than 70 mg/dL, you will need to treat for low blood sugar: o Do not take insulin. o Treat a low blood sugar (less than 70 mg/dL) with  cup of clear juice (cranberry or apple), 4 glucose tablets, OR glucose gel. Recheck blood sugar in 15 minutes after treatment (to make sure it is greater than 70 mg/dL). If your blood sugar is not greater than 70 mg/dL on recheck, call [phone] o  for further instructions. . Report your blood sugar to the short stay nurse when you get to Short Stay.  . If you are admitted to the hospital after surgery: o Your blood sugar will be checked by the staff and you will probably be given insulin after surgery (instead  of oral diabetes medicines) to make sure you have good blood sugar levels. o The goal for blood sugar control after surgery is 80-180 mg/dL.    [place]- Preparing For Surgery  Before surgery, you can play an important role. Because skin is not sterile, your skin needs to be as free of germs as possible. You can reduce the number of germs on your skin by washing with CHG (chlorahexidine gluconate) Soap before surgery.  CHG is an antiseptic cleaner which kills germs and bonds with the skin to continue killing germs even after washing.    Oral  Hygiene is also important to reduce your risk of infection.  Remember - BRUSH YOUR TEETH THE MORNING OF SURGERY WITH YOUR REGULAR TOOTHPASTE  Please do not use if you have an allergy to CHG or antibacterial soaps. If your skin becomes reddened/irritated stop using the CHG.  Do not shave (including legs and underarms) for at least 48 hours prior to first CHG shower. It is OK to shave your face.  Please follow these instructions carefully.   1. Shower the NIGHT BEFORE SURGERY and the MORNING OF SURGERY with CHG.   2. If you chose to wash your hair, wash your hair first as usual with your normal shampoo.  3. After you shampoo, rinse your hair and body thoroughly to remove the shampoo.  4. Use CHG as you would any other liquid soap. You can apply CHG directly to the skin and wash gently with a scrungie or a clean washcloth.   5. Apply the CHG Soap to your body ONLY FROM THE NECK DOWN.  Do not use on open wounds or open sores. Avoid contact with your eyes, ears, mouth and genitals (private parts). Wash Face and genitals (private parts)  with your normal soap.  6. Wash thoroughly, paying special attention to the area where your surgery will be performed.  7. Thoroughly rinse your body with warm water from the neck down.  8. DO NOT shower/wash with your normal soap after using and rinsing off the CHG Soap.  9. Pat yourself dry with a CLEAN TOWEL.  10. Wear CLEAN PAJAMAS to bed the night before surgery, wear comfortable clothes the morning of surgery  11. Place CLEAN SHEETS on your bed the night of your first shower and DO NOT SLEEP WITH PETS.  Day of Surgery:  Do not apply any deodorants/lotions.  Please wear clean clothes to the hospital/surgery center.   Remember to brush your teeth WITH YOUR REGULAR TOOTHPASTE.    Do not wear jewelry, make-up or nail polish.  Do not wear lotions, powders, or perfumes, or deodorant.  Do not shave 48 hours prior to surgery.    Do not bring valuables  to the hospital.  [facility] is not responsible for any belongings or valuables.  Contacts, dentures or bridgework may not be worn into surgery.    For patients admitted to the hospital, discharge time will be determined by your treatment team.  IF you are a smoker, DO NOT Smoke 24 hours prior to surgery   IF you wear a CPAP at night please bring your mask, tubing, and machine the morning of surgery    Remember that you must have someone to transport you home after your surgery, and remain with you for 24 hours if you are discharged the same day.   Please read over the following fact sheets that you were given.

## 2018-09-17 ENCOUNTER — Encounter (HOSPITAL_COMMUNITY): Payer: Self-pay | Admitting: Physician Assistant

## 2018-09-17 ENCOUNTER — Encounter (HOSPITAL_COMMUNITY): Payer: Self-pay

## 2018-09-17 ENCOUNTER — Encounter (HOSPITAL_COMMUNITY)
Admission: RE | Admit: 2018-09-17 | Discharge: 2018-09-17 | Disposition: A | Discharge status: Home / Self Care | Payer: PPO | Source: Ambulatory Visit | Attending: Orthopaedic Surgery | Admitting: Orthopaedic Surgery

## 2018-09-17 ENCOUNTER — Other Ambulatory Visit: Payer: Self-pay

## 2018-09-17 DIAGNOSIS — M810 Age-related osteoporosis without current pathological fracture: Secondary | ICD-10-CM | POA: Diagnosis not present

## 2018-09-17 DIAGNOSIS — Z7984 Long term (current) use of oral hypoglycemic drugs: Secondary | ICD-10-CM | POA: Diagnosis not present

## 2018-09-17 DIAGNOSIS — I130 Hypertensive heart and chronic kidney disease with heart failure and stage 1 through stage 4 chronic kidney disease, or unspecified chronic kidney disease: Secondary | ICD-10-CM | POA: Insufficient documentation

## 2018-09-17 DIAGNOSIS — N183 Chronic kidney disease, stage 3 (moderate): Secondary | ICD-10-CM | POA: Diagnosis not present

## 2018-09-17 DIAGNOSIS — I5030 Unspecified diastolic (congestive) heart failure: Secondary | ICD-10-CM | POA: Insufficient documentation

## 2018-09-17 DIAGNOSIS — E1122 Type 2 diabetes mellitus with diabetic chronic kidney disease: Secondary | ICD-10-CM | POA: Insufficient documentation

## 2018-09-17 DIAGNOSIS — Z01818 Encounter for other preprocedural examination: Secondary | ICD-10-CM | POA: Insufficient documentation

## 2018-09-17 DIAGNOSIS — F17200 Nicotine dependence, unspecified, uncomplicated: Secondary | ICD-10-CM | POA: Diagnosis not present

## 2018-09-17 DIAGNOSIS — K219 Gastro-esophageal reflux disease without esophagitis: Secondary | ICD-10-CM | POA: Insufficient documentation

## 2018-09-17 DIAGNOSIS — Z8711 Personal history of peptic ulcer disease: Secondary | ICD-10-CM | POA: Insufficient documentation

## 2018-09-17 DIAGNOSIS — Z79899 Other long term (current) drug therapy: Secondary | ICD-10-CM | POA: Insufficient documentation

## 2018-09-17 DIAGNOSIS — K8020 Calculus of gallbladder without cholecystitis without obstruction: Secondary | ICD-10-CM | POA: Insufficient documentation

## 2018-09-17 DIAGNOSIS — J45909 Unspecified asthma, uncomplicated: Secondary | ICD-10-CM | POA: Diagnosis not present

## 2018-09-17 HISTORY — DX: Personal history of other diseases of the digestive system: Z87.19

## 2018-09-17 HISTORY — DX: Bronchitis, not specified as acute or chronic: J40

## 2018-09-17 LAB — CBC
HCT: 31.3 % — ABNORMAL LOW (ref 36.0–46.0)
Hemoglobin: 9.5 g/dL — ABNORMAL LOW (ref 12.0–15.0)
MCH: 26.5 pg (ref 26.0–34.0)
MCHC: 30.4 g/dL (ref 30.0–36.0)
MCV: 87.4 fL (ref 80.0–100.0)
Platelets: 255 10*3/uL (ref 150–400)
RBC: 3.58 MIL/uL — ABNORMAL LOW (ref 3.87–5.11)
RDW: 15 % (ref 11.5–15.5)
WBC: 8 10*3/uL (ref 4.0–10.5)
nRBC: 0 % (ref 0.0–0.2)

## 2018-09-17 LAB — BASIC METABOLIC PANEL
Anion gap: 9 (ref 5–15)
BUN: 13 mg/dL (ref 8–23)
CO2: 22 mmol/L (ref 22–32)
Calcium: 8.6 mg/dL — ABNORMAL LOW (ref 8.9–10.3)
Chloride: 110 mmol/L (ref 98–111)
Creatinine, Ser: 1.91 mg/dL — ABNORMAL HIGH (ref 0.44–1.00)
GFR calc Af Amer: 31 mL/min — ABNORMAL LOW (ref >60)
GFR calc non Af Amer: 27 mL/min — ABNORMAL LOW (ref >60)
Glucose, Bld: 196 mg/dL — ABNORMAL HIGH (ref 70–99)
Potassium: 3.8 mmol/L (ref 3.5–5.1)
Sodium: 141 mmol/L (ref 135–145)

## 2018-09-17 LAB — HEMOGLOBIN A1C
Hgb A1c MFr Bld: 8.1 % — ABNORMAL HIGH (ref 4.8–5.6)
Mean Plasma Glucose: 185.77 mg/dL

## 2018-09-17 LAB — GLUCOSE, CAPILLARY: Glucose-Capillary: 203 mg/dL — ABNORMAL HIGH (ref 70–99)

## 2018-09-17 NOTE — Progress Notes (Signed)
  Coronavirus Screening  Have you experienced the following symptoms:  Cough no Fever (>100.54F)  no Runny nose no Sore throat no Difficulty breathing/shortness of breath  no  Have you or a family member traveled in the last 14 days and where? no   If the patient indicates "YES" to the above questions, their PAT will be rescheduled to limit the exposure to others and, the surgeon will be notified. THE PATIENT WILL NEED TO BE ASYMPTOMATIC FOR 14 DAYS.   If the patient is not experiencing any of these symptoms, the PAT nurse will instruct them to NOT bring anyone with them to their appointment since they may have these symptoms or traveled as well.   Please remind your patients and families that hospital visitation restrictions are in effect and the importance of the restrictions.

## 2018-09-17 NOTE — Progress Notes (Addendum)
PCP - Antony Contras @ [facility] Nephrology: [facility] Endocrinology: [facility]  Chest x-ray - 04/30/18 1 view EKG - 07/30/18 Stress Test - 07/30/18 ECHO - 2/20 Cardiac Cath -   Sleep Study - scheduled for 10/03/18 CPAP -   Fasting Blood Sugar - 107-164 Checks Blood Sugar __2___ times a day  Blood Thinner Instructions: Aspirin Instructions:na  Anesthesia review: blood pressure/cardiac hx. Increase swelling in feet ( 3+right, 2+ left) Pt. States she noticed increasing swelling 09/11/18 and had been trying to contact Dr. Blenda Mounts office since Monday with no success. Notified Jeneen Rinks [place]. Stated he would send them a message and for pt. To reach out again to their office. Pt. Didn't take blood pressure meds this am.  Patient denies  fever, cough and chest pain at PAT appointment   Patient verbalized understanding of instructions that were given to them at the PAT appointment. Patient was also instructed that they will need to review over the PAT instructions again at home before surgery.

## 2018-09-18 ENCOUNTER — Telehealth: Payer: Self-pay | Admitting: Cardiovascular Disease

## 2018-09-18 NOTE — Progress Notes (Signed)
Anesthesia Chart Review:  Case: [id number] Date/Time: 09/24/18 1130   Procedure: LAPAROSCOPIC CHOLECYSTECTOMY (N/A )   Anesthesia type: General   Pre-op diagnosis: SYMPTOMATIC GALLSTONES   Location: [facility] ROOM 02 / [facility]   Surgeon: Coralie Keens, MD      DISCUSSION: 68 yo female current smoker. Pertinent hx includes GERD, DMII, HFpEF, Hiatal hernia, CKD III, Asthma, HTN, PUD.  Recently hospitalized 5/12-5/13/20 for acute on chronic HFpEF exacerbation. She was seen for postdischarge followup and preop clearance by Jory Sims, NP on 08/06/18. Per her note "During hospitalization the patient had a Myoview stress test which was negative for ischemia or infarct.  She was also to be evaluated due to abdominal pain with abdominal ultrasound significant for gallstones and sludge but no evidence of acute cholecystitis.  She was not recommended for surgery during hospitalization per surgical consultation.  She was to have outpatient follow-up.  The patient was to resume home regimen of Coreg and Lasix, she is allergic to ACE and ARB.  She therefore remained on clonidine 0.2 mg 3 times daily.Marland KitchenMarland KitchenChart reviewed as part of pre-operative protocol coverage. Given past medical history and time since last visit, based on ACC/AHA guidelines, Jana Dalaysia Harms would be at acceptable risk for the planned procedure without further cardiovascular testing."  At PAT appt the pt reported she has had increased swelling in her legs for the past several days and has been trying to get an appt with Dr. Oval Linsey. Bilateral pitting 2+ edema to the shins per PAT nurse. BP markedly elevated 206/80. Pt also reported some SOB at night. Denied CP or dyspnea at rest. She was concerned she was holding extra fluid despite taking Lasix as prescribed.  Although she does have recent clearance, her recent increase in LEE and uncontrolled BP is concerning for possible exacerbation of HFpEF. I reached out to Jory Sims, NP and her  office has reached out to the pt to schedule recheck prior to surgery. Ability to proceed with surgery pending cardiology eval.   VS: BP (!) 206/80   Pulse 79   Temp 37.3 C (Oral)   Resp 18   Ht 5\' 8"  (1.727 m)   Wt 108.4 kg   SpO2 97%   BMI 36.35 kg/m   PROVIDERS: Antony Contras, MD is PCP  Skeet Latch, MD is Cardiologist  LABS: Creatinine c/w her CKD, baseline per previous labs ~2.0. (all labs ordered are listed, but only abnormal results are displayed)  Labs Reviewed  GLUCOSE, CAPILLARY - Abnormal; Notable for the following components:      Result Value   Glucose-Capillary 203 (*)    All other components within normal limits  BASIC METABOLIC PANEL - Abnormal; Notable for the following components:   Glucose, Bld 196 (*)    Creatinine, Ser 1.91 (*)    Calcium 8.6 (*)    GFR calc non Af Amer 27 (*)    GFR calc Af Amer 31 (*)    All other components within normal limits  CBC - Abnormal; Notable for the following components:   RBC 3.58 (*)    Hemoglobin 9.5 (*)    HCT 31.3 (*)    All other components within normal limits  HEMOGLOBIN A1C - Abnormal; Notable for the following components:   Hgb A1c MFr Bld 8.1 (*)    All other components within normal limits     IMAGES: PORTABLE CHEST 1 VIEW 07/29/18:  COMPARISON:  04/28/2018  FINDINGS: Cardiac enlargement without heart failure. Lungs are now  clear without infiltrate or effusion. Interval resolution of small effusions and right lower lobe airspace disease.  IMPRESSION: No active disease.   EKG: Sinus rhythm. Rate 84. Prolonged QT interval QT/QTc 430/509  CV: Nuclear stress 07/30/18: IMPRESSION: 1. no definitive scintigraphic evidence of prior infarction pharmacologically induced ischemia. 2. Severe left ventricular dilatation. 3. Global left ventricular hypokinesia. Left ventricular ejection fraction-42%   TTE 04/28/18:  1. The left ventricle has normal systolic function of 10-27%. The cavity  size was normal. There is mildly increased left ventricular wall thickness. Echo evidence of impaired diastolic relaxation Elevated mean left atrial pressure.  2. The right ventricle has normal systolic function. The cavity was normal. There is no increase in right ventricular wall thickness.  3. Left atrial size was mildly dilated.  4. The mitral valve is normal in structure. There is mild thickening.  5. The tricuspid valve is normal in structure.  6. The aortic valve is normal in structure. Aortic valve regurgitation is mild by color flow Doppler.  7. The inferior vena cava was dilated in size with >50% respiratory variability.   Past Medical History:  Diagnosis Date  . Allergy   . Arthritis   . Asthma    weazing at night uses inhalers  . Bronchitis    history of  . Chronic diastolic CHF (congestive heart failure) ([place]) 04/2018  . CKD (chronic kidney disease), stage III ([place])   . Diabetes mellitus without complication ([place])   . Gastric ulcer   . GERD (gastroesophageal reflux disease)   . History of hiatal hernia   . Hypertension   . Osteoporosis     Past Surgical History:  Procedure Laterality Date  . DILATION AND CURETTAGE OF UTERUS    . EYE SURGERY Left    removed cataract w/ lens  . TUBAL LIGATION      MEDICATIONS: . albuterol (PROVENTIL HFA;VENTOLIN HFA) 108 (90 Base) MCG/ACT inhaler  . atorvastatin (LIPITOR) 40 MG tablet  . carvedilol (COREG) 3.125 MG tablet  . cloNIDine (CATAPRES) 0.2 MG tablet  . cyclobenzaprine (FLEXERIL) 5 MG tablet  . ergocalciferol (VITAMIN D2) 1.25 MG (50000 UT) capsule  . fexofenadine (ALLEGRA) 180 MG tablet  . furosemide (LASIX) 20 MG tablet  . gabapentin (NEURONTIN) 300 MG capsule  . glipiZIDE (GLUCOTROL XL) 10 MG 24 hr tablet  . glipiZIDE (GLUCOTROL) 10 MG tablet  . linaclotide (LINZESS) 145 MCG CAPS capsule  . oxyCODONE-acetaminophen (PERCOCET/ROXICET) 5-325 MG tablet  . pantoprazole (PROTONIX) 40 MG tablet  . polyethylene glycol  (MIRALAX / GLYCOLAX) packet  . triamcinolone cream (KENALOG) 0.1 %   No current facility-administered medications for this encounter.     Wynonia Musty [facility] Short Stay Center/Anesthesiology Phone [phone] 09/22/2018 12:15 PM

## 2018-09-18 NOTE — Telephone Encounter (Signed)
Called pt x3. Left message to call back to schedule for appt 7/7 at 11:15am with Jory Sims, NP

## 2018-09-18 NOTE — Telephone Encounter (Signed)
Spoke with Zoe Clarke who states she has had issues with edema to BLE that started on 6/19. Per Zoe Clarke, edema is from below mid-calf down to feet. She states she went to her PCP office 6/21 and a PA recommended that she increase her daily furosemide 20 mg to 40mg  a day which she has been taking with no real decrease in edema. She states she elevates her feet on a pillow at night to help with swelling, but it only helps a little bit. No compression stockings and Zoe Clarke denies indiscretion with salt. Zoe Clarke states she drinks half a gallon of water daily and states she has been drinking more water lately due to dry mouth and believes clonidine to be the cause of her dry mouth. She states that she recently went to pre-op for her upcoming gallbladder procedure and they were concerned about the swelling to her feet. Per Zoe Clarke, they advised her to follow up with her cardiologist for evaluation because they are not sure if edema is medicine- or heart-related? Zoe Clarke states she was told that they may not be able to do her procedure if this is not addressed. She states she is scheduled for 7/8. Told Zoe Clarke nurse would call back after confirming with Barbaraann Barthel, CMA that it is ok to add Zoe Clarke to 72 hour slot for 7/7. Encounter routed to Jory Sims, DNP for recommendations

## 2018-09-18 NOTE — Telephone Encounter (Signed)
Called pt and lmtcb to schedule an appt

## 2018-09-18 NOTE — Telephone Encounter (Signed)
Pt c/o swelling: STAT is pt has developed SOB within 24 hours  How much weight have you gained and in what time span? No 1) If swelling, where is the swelling located?  Feet.  2) Are you currently taking a fluid pill? Patient was taking a fluid pill.  3) Are you currently SOB? No  4) Do you have a log of your daily weights (if so, list)? No  5) Have you gained 3 pounds in a day or 5 pounds in a week? No.  6) Have you traveled recently? No patient feet are swelling.

## 2018-09-18 NOTE — Telephone Encounter (Signed)
Thank you :)

## 2018-09-22 ENCOUNTER — Other Ambulatory Visit (HOSPITAL_COMMUNITY): Payer: PPO

## 2018-09-22 ENCOUNTER — Inpatient Hospital Stay (HOSPITAL_COMMUNITY)
Admission: EM | Admit: 2018-09-22 | Discharge: 2018-09-30 | DRG: 291 | Disposition: A | Discharge status: Home Health | Payer: PPO | Attending: Internal Medicine | Admitting: Internal Medicine

## 2018-09-22 ENCOUNTER — Encounter (HOSPITAL_COMMUNITY): Payer: Self-pay

## 2018-09-22 ENCOUNTER — Emergency Department (HOSPITAL_COMMUNITY): Payer: PPO

## 2018-09-22 ENCOUNTER — Other Ambulatory Visit: Payer: Self-pay

## 2018-09-22 DIAGNOSIS — I509 Heart failure, unspecified: Secondary | ICD-10-CM | POA: Diagnosis not present

## 2018-09-22 DIAGNOSIS — Z209 Contact with and (suspected) exposure to unspecified communicable disease: Secondary | ICD-10-CM | POA: Diagnosis not present

## 2018-09-22 DIAGNOSIS — D631 Anemia in chronic kidney disease: Secondary | ICD-10-CM | POA: Diagnosis present

## 2018-09-22 DIAGNOSIS — F17210 Nicotine dependence, cigarettes, uncomplicated: Secondary | ICD-10-CM | POA: Diagnosis not present

## 2018-09-22 DIAGNOSIS — I161 Hypertensive emergency: Secondary | ICD-10-CM | POA: Diagnosis present

## 2018-09-22 DIAGNOSIS — I5043 Acute on chronic combined systolic (congestive) and diastolic (congestive) heart failure: Secondary | ICD-10-CM

## 2018-09-22 DIAGNOSIS — I5033 Acute on chronic diastolic (congestive) heart failure: Secondary | ICD-10-CM | POA: Diagnosis not present

## 2018-09-22 DIAGNOSIS — Z7952 Long term (current) use of systemic steroids: Secondary | ICD-10-CM

## 2018-09-22 DIAGNOSIS — M25561 Pain in right knee: Secondary | ICD-10-CM | POA: Diagnosis not present

## 2018-09-22 DIAGNOSIS — E041 Nontoxic single thyroid nodule: Secondary | ICD-10-CM | POA: Diagnosis not present

## 2018-09-22 DIAGNOSIS — Z1159 Encounter for screening for other viral diseases: Secondary | ICD-10-CM

## 2018-09-22 DIAGNOSIS — E785 Hyperlipidemia, unspecified: Secondary | ICD-10-CM | POA: Diagnosis present

## 2018-09-22 DIAGNOSIS — I493 Ventricular premature depolarization: Secondary | ICD-10-CM | POA: Diagnosis present

## 2018-09-22 DIAGNOSIS — M810 Age-related osteoporosis without current pathological fracture: Secondary | ICD-10-CM | POA: Diagnosis not present

## 2018-09-22 DIAGNOSIS — N183 Chronic kidney disease, stage 3 unspecified: Secondary | ICD-10-CM | POA: Diagnosis present

## 2018-09-22 DIAGNOSIS — I5021 Acute systolic (congestive) heart failure: Secondary | ICD-10-CM | POA: Diagnosis not present

## 2018-09-22 DIAGNOSIS — G4734 Idiopathic sleep related nonobstructive alveolar hypoventilation: Secondary | ICD-10-CM | POA: Diagnosis not present

## 2018-09-22 DIAGNOSIS — I472 Ventricular tachycardia: Secondary | ICD-10-CM | POA: Diagnosis not present

## 2018-09-22 DIAGNOSIS — Z79899 Other long term (current) drug therapy: Secondary | ICD-10-CM

## 2018-09-22 DIAGNOSIS — N179 Acute kidney failure, unspecified: Secondary | ICD-10-CM | POA: Diagnosis present

## 2018-09-22 DIAGNOSIS — Z9114 Patient's other noncompliance with medication regimen: Secondary | ICD-10-CM

## 2018-09-22 DIAGNOSIS — I130 Hypertensive heart and chronic kidney disease with heart failure and stage 1 through stage 4 chronic kidney disease, or unspecified chronic kidney disease: Secondary | ICD-10-CM | POA: Diagnosis not present

## 2018-09-22 DIAGNOSIS — R42 Dizziness and giddiness: Secondary | ICD-10-CM | POA: Diagnosis not present

## 2018-09-22 DIAGNOSIS — Z6837 Body mass index (BMI) 37.0-37.9, adult: Secondary | ICD-10-CM

## 2018-09-22 DIAGNOSIS — R52 Pain, unspecified: Secondary | ICD-10-CM | POA: Diagnosis not present

## 2018-09-22 DIAGNOSIS — G4739 Other sleep apnea: Secondary | ICD-10-CM | POA: Diagnosis not present

## 2018-09-22 DIAGNOSIS — G8929 Other chronic pain: Secondary | ICD-10-CM | POA: Diagnosis not present

## 2018-09-22 DIAGNOSIS — I5041 Acute combined systolic (congestive) and diastolic (congestive) heart failure: Secondary | ICD-10-CM | POA: Diagnosis not present

## 2018-09-22 DIAGNOSIS — E1122 Type 2 diabetes mellitus with diabetic chronic kidney disease: Secondary | ICD-10-CM

## 2018-09-22 DIAGNOSIS — Z7984 Long term (current) use of oral hypoglycemic drugs: Secondary | ICD-10-CM

## 2018-09-22 DIAGNOSIS — G4730 Sleep apnea, unspecified: Secondary | ICD-10-CM

## 2018-09-22 DIAGNOSIS — J45909 Unspecified asthma, uncomplicated: Secondary | ICD-10-CM | POA: Diagnosis not present

## 2018-09-22 DIAGNOSIS — R0902 Hypoxemia: Secondary | ICD-10-CM | POA: Diagnosis not present

## 2018-09-22 DIAGNOSIS — Z720 Tobacco use: Secondary | ICD-10-CM | POA: Diagnosis present

## 2018-09-22 DIAGNOSIS — K219 Gastro-esophageal reflux disease without esophagitis: Secondary | ICD-10-CM | POA: Diagnosis present

## 2018-09-22 DIAGNOSIS — T502X5A Adverse effect of carbonic-anhydrase inhibitors, benzothiadiazides and other diuretics, initial encounter: Secondary | ICD-10-CM | POA: Diagnosis present

## 2018-09-22 DIAGNOSIS — Z20828 Contact with and (suspected) exposure to other viral communicable diseases: Secondary | ICD-10-CM | POA: Diagnosis not present

## 2018-09-22 DIAGNOSIS — E118 Type 2 diabetes mellitus with unspecified complications: Secondary | ICD-10-CM

## 2018-09-22 DIAGNOSIS — E1165 Type 2 diabetes mellitus with hyperglycemia: Secondary | ICD-10-CM | POA: Diagnosis not present

## 2018-09-22 DIAGNOSIS — E119 Type 2 diabetes mellitus without complications: Secondary | ICD-10-CM | POA: Diagnosis present

## 2018-09-22 DIAGNOSIS — I160 Hypertensive urgency: Secondary | ICD-10-CM | POA: Diagnosis not present

## 2018-09-22 DIAGNOSIS — G4733 Obstructive sleep apnea (adult) (pediatric): Secondary | ICD-10-CM | POA: Diagnosis not present

## 2018-09-22 DIAGNOSIS — Z96659 Presence of unspecified artificial knee joint: Secondary | ICD-10-CM | POA: Diagnosis not present

## 2018-09-22 DIAGNOSIS — Z9660 Presence of unspecified orthopedic joint implant: Secondary | ICD-10-CM | POA: Diagnosis not present

## 2018-09-22 DIAGNOSIS — J90 Pleural effusion, not elsewhere classified: Secondary | ICD-10-CM | POA: Diagnosis not present

## 2018-09-22 DIAGNOSIS — Z716 Tobacco abuse counseling: Secondary | ICD-10-CM

## 2018-09-22 DIAGNOSIS — R0602 Shortness of breath: Secondary | ICD-10-CM | POA: Diagnosis not present

## 2018-09-22 DIAGNOSIS — I110 Hypertensive heart disease with heart failure: Secondary | ICD-10-CM | POA: Diagnosis not present

## 2018-09-22 DIAGNOSIS — R072 Precordial pain: Secondary | ICD-10-CM | POA: Insufficient documentation

## 2018-09-22 HISTORY — DX: Sleep apnea, unspecified: G47.30

## 2018-09-22 LAB — COMPREHENSIVE METABOLIC PANEL
ALT: 15 U/L (ref 0–44)
AST: 12 U/L — ABNORMAL LOW (ref 15–41)
Albumin: 2.9 g/dL — ABNORMAL LOW (ref 3.5–5.0)
Alkaline Phosphatase: 106 U/L (ref 38–126)
Anion gap: 10 (ref 5–15)
BUN: 15 mg/dL (ref 8–23)
CO2: 27 mmol/L (ref 22–32)
Calcium: 8.6 mg/dL — ABNORMAL LOW (ref 8.9–10.3)
Chloride: 106 mmol/L (ref 98–111)
Creatinine, Ser: 1.99 mg/dL — ABNORMAL HIGH (ref 0.44–1.00)
GFR calc Af Amer: 30 mL/min — ABNORMAL LOW (ref >60)
GFR calc non Af Amer: 26 mL/min — ABNORMAL LOW (ref >60)
Glucose, Bld: 196 mg/dL — ABNORMAL HIGH (ref 70–99)
Potassium: 4.1 mmol/L (ref 3.5–5.1)
Sodium: 143 mmol/L (ref 135–145)
Total Bilirubin: 0.7 mg/dL (ref 0.3–1.2)
Total Protein: 6.8 g/dL (ref 6.5–8.1)

## 2018-09-22 LAB — CBC WITH DIFFERENTIAL/PLATELET
Abs Immature Granulocytes: 0.04 10*3/uL (ref 0.00–0.07)
Basophils Absolute: 0 10*3/uL (ref 0.0–0.1)
Basophils Relative: 0 %
Eosinophils Absolute: 0.1 10*3/uL (ref 0.0–0.5)
Eosinophils Relative: 2 %
HCT: 30.2 % — ABNORMAL LOW (ref 36.0–46.0)
Hemoglobin: 9.3 g/dL — ABNORMAL LOW (ref 12.0–15.0)
Immature Granulocytes: 1 %
Lymphocytes Relative: 20 %
Lymphs Abs: 1.7 10*3/uL (ref 0.7–4.0)
MCH: 26.6 pg (ref 26.0–34.0)
MCHC: 30.8 g/dL (ref 30.0–36.0)
MCV: 86.5 fL (ref 80.0–100.0)
Monocytes Absolute: 0.5 10*3/uL (ref 0.1–1.0)
Monocytes Relative: 6 %
Neutro Abs: 6.1 10*3/uL (ref 1.7–7.7)
Neutrophils Relative %: 71 %
Platelets: 241 10*3/uL (ref 150–400)
RBC: 3.49 MIL/uL — ABNORMAL LOW (ref 3.87–5.11)
RDW: 15 % (ref 11.5–15.5)
WBC: 8.6 10*3/uL (ref 4.0–10.5)
nRBC: 0 % (ref 0.0–0.2)

## 2018-09-22 LAB — TROPONIN I (HIGH SENSITIVITY)
Troponin I (High Sensitivity): 27 ng/L — ABNORMAL HIGH (ref <18)
Troponin I (High Sensitivity): 28 ng/L — ABNORMAL HIGH (ref <18)
Troponin I (High Sensitivity): 28 ng/L — ABNORMAL HIGH (ref <18)

## 2018-09-22 LAB — GLUCOSE, CAPILLARY: Glucose-Capillary: 86 mg/dL (ref 70–99)

## 2018-09-22 LAB — CBG MONITORING, ED: Glucose-Capillary: 187 mg/dL — ABNORMAL HIGH (ref 70–99)

## 2018-09-22 LAB — SARS CORONAVIRUS 2 BY RT PCR (HOSPITAL ORDER, PERFORMED IN ~~LOC~~ HOSPITAL LAB): SARS Coronavirus 2: NEGATIVE

## 2018-09-22 LAB — BRAIN NATRIURETIC PEPTIDE: B Natriuretic Peptide: 1721 pg/mL — ABNORMAL HIGH (ref 0.0–100.0)

## 2018-09-22 MED ORDER — ALBUTEROL SULFATE HFA 108 (90 BASE) MCG/ACT IN AERS
2.0000 | INHALATION_SPRAY | Freq: Once | RESPIRATORY_TRACT | Status: AC
  Administered 2018-09-22: 2 via RESPIRATORY_TRACT
  Filled 2018-09-22: qty 6.7

## 2018-09-22 MED ORDER — FUROSEMIDE 10 MG/ML IJ SOLN
40.0000 mg | Freq: Two times a day (BID) | INTRAMUSCULAR | Status: DC
  Administered 2018-09-22 – 2018-09-23 (×2): 40 mg via INTRAVENOUS
  Filled 2018-09-22 (×2): qty 4

## 2018-09-22 MED ORDER — NITROGLYCERIN IN D5W 200-5 MCG/ML-% IV SOLN
0.0000 ug/min | INTRAVENOUS | Status: DC
  Administered 2018-09-22: 5 ug/min via INTRAVENOUS
  Administered 2018-09-23: 90 ug/min via INTRAVENOUS
  Filled 2018-09-22 (×2): qty 250

## 2018-09-22 MED ORDER — INSULIN ASPART 100 UNIT/ML ~~LOC~~ SOLN
0.0000 [IU] | Freq: Three times a day (TID) | SUBCUTANEOUS | Status: DC
  Administered 2018-09-22: 4 [IU] via SUBCUTANEOUS
  Administered 2018-09-23 (×2): 3 [IU] via SUBCUTANEOUS

## 2018-09-22 MED ORDER — ONDANSETRON HCL 4 MG/2ML IJ SOLN
4.0000 mg | Freq: Four times a day (QID) | INTRAMUSCULAR | Status: DC | PRN
  Administered 2018-09-26 (×2): 4 mg via INTRAVENOUS
  Filled 2018-09-22 (×3): qty 2

## 2018-09-22 MED ORDER — HEPARIN SODIUM (PORCINE) 5000 UNIT/ML IJ SOLN
5000.0000 [IU] | Freq: Three times a day (TID) | INTRAMUSCULAR | Status: DC
  Administered 2018-09-22 – 2018-09-30 (×24): 5000 [IU] via SUBCUTANEOUS
  Filled 2018-09-22 (×24): qty 1

## 2018-09-22 MED ORDER — INSULIN ASPART 100 UNIT/ML ~~LOC~~ SOLN
4.0000 [IU] | Freq: Three times a day (TID) | SUBCUTANEOUS | Status: DC
  Administered 2018-09-22 – 2018-09-23 (×2): 4 [IU] via SUBCUTANEOUS
  Administered 2018-09-23: 2 [IU] via SUBCUTANEOUS
  Administered 2018-09-23 – 2018-09-24 (×3): 4 [IU] via SUBCUTANEOUS

## 2018-09-22 MED ORDER — HYDRALAZINE HCL 20 MG/ML IJ SOLN
20.0000 mg | INTRAMUSCULAR | Status: AC
  Administered 2018-09-22: 20 mg via INTRAVENOUS
  Filled 2018-09-22: qty 1

## 2018-09-22 MED ORDER — CARVEDILOL 3.125 MG PO TABS
3.1250 mg | ORAL_TABLET | Freq: Once | ORAL | Status: AC
  Administered 2018-09-22: 3.125 mg via ORAL
  Filled 2018-09-22: qty 1

## 2018-09-22 MED ORDER — ACETAMINOPHEN 325 MG PO TABS: 650.0000 mg | ORAL_TABLET | ORAL | Status: DC | PRN

## 2018-09-22 MED ORDER — POTASSIUM CHLORIDE CRYS ER 10 MEQ PO TBCR
10.0000 meq | EXTENDED_RELEASE_TABLET | Freq: Two times a day (BID) | ORAL | Status: DC
  Administered 2018-09-22 – 2018-09-26 (×9): 10 meq via ORAL
  Filled 2018-09-22 (×9): qty 1

## 2018-09-22 MED ORDER — SODIUM CHLORIDE 0.9 % IV SOLN: 250.0000 mL | INTRAVENOUS | Status: DC | PRN

## 2018-09-22 MED ORDER — SODIUM CHLORIDE 0.9% FLUSH: 3.0000 mL | INTRAVENOUS | Status: DC | PRN

## 2018-09-22 MED ORDER — NITROGLYCERIN 0.4 MG SL SUBL
0.4000 mg | SUBLINGUAL_TABLET | SUBLINGUAL | Status: DC | PRN
  Administered 2018-09-22 (×2): 0.4 mg via SUBLINGUAL
  Filled 2018-09-22: qty 1

## 2018-09-22 MED ORDER — FUROSEMIDE 10 MG/ML IJ SOLN
40.0000 mg | Freq: Once | INTRAMUSCULAR | Status: AC
  Administered 2018-09-22: 12:00:00 40 mg via INTRAVENOUS
  Filled 2018-09-22: qty 4

## 2018-09-22 MED ORDER — INSULIN ASPART 100 UNIT/ML ~~LOC~~ SOLN: 0.0000 [IU] | Freq: Every day | SUBCUTANEOUS | Status: DC

## 2018-09-22 MED ORDER — ASPIRIN 81 MG PO CHEW
324.0000 mg | CHEWABLE_TABLET | Freq: Once | ORAL | Status: DC
  Filled 2018-09-22: qty 4

## 2018-09-22 MED ORDER — CLONIDINE HCL 0.2 MG PO TABS
0.2000 mg | ORAL_TABLET | Freq: Once | ORAL | Status: AC
  Administered 2018-09-22: 0.2 mg via ORAL
  Filled 2018-09-22: qty 1

## 2018-09-22 MED ORDER — ZOLPIDEM TARTRATE 5 MG PO TABS: 5.0000 mg | ORAL_TABLET | Freq: Every evening | ORAL | Status: DC | PRN

## 2018-09-22 MED ORDER — SODIUM CHLORIDE 0.9% FLUSH
3.0000 mL | Freq: Two times a day (BID) | INTRAVENOUS | Status: DC
  Administered 2018-09-22 – 2018-09-30 (×15): 3 mL via INTRAVENOUS

## 2018-09-22 NOTE — TOC Initial Note (Signed)
Transition of Care [facility]) - Initial/Assessment Note    Patient Details  Name: Zoe Clarke MRN: [id number] Date of Birth: [birth year]  [facility] CM/SW Contact:    Fuller Mandril, RN Phone Number: 09/22/2018, 2:44 PM  Clinical Narrative:                 [facility] consulted regarding heart failure home health screen and may place order for PT/OT eval and treat if indicated order.   Expected Discharge Plan: Home/Self Care Barriers to Discharge: Continued Medical Work up   Patient Goals and CMS Choice Patient states their goals for this hospitalization and ongoing recovery are:: fell better      Expected Discharge Plan and Services Expected Discharge Plan: Home/Self Care   Discharge Planning Services: CM Consult   Living arrangements for the past 2 months: Apartment                     Follow for disposition needs.                   Prior Living Arrangements/Services Living arrangements for the past 2 months: Apartment Lives with:: Spouse Patient language and need for interpreter reviewed:: Yes        Need for Family Participation in Patient Care: Yes (Comment) Care giver support system in place?: Yes (comment)   Criminal Activity/Legal Involvement Pertinent to Current Situation/Hospitalization: No - Comment as needed  Activities of Daily Living      Permission Sought/Granted                  Emotional Assessment Appearance:: Appears stated age Attitude/Demeanor/Rapport: Engaged Affect (typically observed): Accepting Orientation: : Oriented to Self, Oriented to Place, Oriented to  Time, Oriented to Situation Alcohol / Substance Use: Tobacco Use Psych Involvement: No (comment)  Admission diagnosis:  sob Patient Active Problem List   Diagnosis Date Noted  . 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 Thyroid nodule  09/22/2018  . Anemia, chronic renal failure, stage 3 (moderate) ([place]) 09/22/2018  . Sleep apnea 09/22/2018  . Symptomatic cholelithiasis 07/30/2018  . Acute on chronic combined systolic (congestive) and diastolic (congestive) heart failure ([place]) 07/29/2018  . Dyspnea 07/29/2018  . Tobacco abuse counseling 04/27/2018  . Acute pyelonephritis 04/10/2018  . Hypertensive urgency 04/10/2018  . Leukocytosis 09/10/2017  . Anemia 09/10/2017  . Knee pain, bilateral 01/13/2015  . Bilateral hip pain 11/23/2014  . Diabetes mellitus type 2, uncontrolled, with complications ([place]) 74/82/7078  . Intertrigo 10/25/2014  . GERD (gastroesophageal reflux disease) 10/25/2014  . Diabetic neuropathy ([place]) 10/25/2014  . Chronic low back pain 10/25/2014  . Current tobacco use 11/11/2013  . Stage 3 chronic kidney disease due to type 2 diabetes mellitus ([place]) 09/02/2013  . Essential (primary) hypertension 09/02/2013  . Gout 12/05/2012   PCP:  Antony Contras, MD Pharmacy:   [facility], [place] [place] [address] Phone: [phone] Fax: [phone]     Social Determinants of Health (SDOH) Interventions    Readmission Risk Interventions No flowsheet data found.

## 2018-09-22 NOTE — ED Triage Notes (Signed)
Pt brought in by [facility] for bilateral leg swelling and left breast swelling. Pt states she stopped taking her "fluid pill" last Monday. Pt endorses SOB, dyspnea at rest, per EMS lung sounds clear, 99% on room air.

## 2018-09-22 NOTE — ED Provider Notes (Signed)
[facility] Provider Note   CSN: [id number] Arrival date & time:       History   Chief Complaint Chief Complaint  Patient presents with   Shortness of Breath    HPI Zoe Clarke is a 68 y.o. female.     HPI  68 year old female presents with shortness of breath.  She is been feeling short of breath for a few days.  Has also had intermittent chest pain today.  Has been having peripheral edema, in her lower extremities and in her left breast.  She stopped taking her Lasix because it was not helping and she thinks it might of been making her leg swelling worse.  Last took it last week.  Did not take any of her meds this morning.  Has had some cough with phlegm and some wheezing as well.  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 ([place]) 09/22/2018   Thyroid nodule 09/22/2018   Anemia, chronic renal failure, stage 3 (moderate) ([place]) 09/22/2018   Sleep apnea 09/22/2018   Symptomatic cholelithiasis 07/30/2018   Acute on chronic combined systolic (congestive) and diastolic (congestive) heart failure ([place]) 07/29/2018   Dyspnea 07/29/2018   Tobacco abuse counseling 04/27/2018   Acute pyelonephritis 04/10/2018   Hypertensive urgency 04/10/2018   Leukocytosis 09/10/2017   Anemia 09/10/2017   Knee pain, bilateral 01/13/2015     Bilateral hip pain 11/23/2014   Diabetes mellitus type 2, uncontrolled, with complications ([place]) 32/99/2426   Intertrigo 10/25/2014   GERD (gastroesophageal reflux disease) 10/25/2014   Diabetic neuropathy ([place]) 10/25/2014   Chronic low back pain 10/25/2014   Current tobacco use 11/11/2013   Stage 3 chronic kidney disease due to type 2 diabetes mellitus ([place]) 09/02/2013   Essential (primary) hypertension 09/02/2013   Gout 12/05/2012    Past Surgical History:  Procedure Laterality Date   DILATION AND CURETTAGE OF UTERUS     EYE SURGERY Left    removed cataract w/ lens   TUBAL LIGATION       OB History   No obstetric history on file.      Home Medications    Prior to Admission medications   Medication Sig Start Date End Date Taking? Authorizing Provider  albuterol (PROVENTIL HFA;VENTOLIN HFA) 108 (90 Base) MCG/ACT inhaler Inhale 2 puffs into the lungs every 6 (six) hours as needed for wheezing or shortness of breath. 04/14/18  Yes Bonnell Public, MD  atorvastatin (LIPITOR) 40 MG tablet Take 40 mg by mouth daily.   Yes [provider]  carvedilol (COREG) 3.125 MG tablet Take 1 tablet (3.125 mg total) by mouth 2 (two) times daily with a meal. 07/18/18  Yes Skeet Latch, MD  cloNIDine (CATAPRES) 0.2 MG tablet Take 1 tablet (0.2 mg total) by mouth 3 (three) times  daily. 07/18/18  Yes Skeet Latch, MD  cyclobenzaprine (FLEXERIL) 5 MG tablet Take 5 mg by mouth at bedtime.   Yes [provider]  ergocalciferol (VITAMIN D2) 1.25 MG (50000 UT) capsule Take 50,000 Units by mouth once a week.   Yes [provider]  fexofenadine (ALLEGRA) 180 MG tablet Take 180 mg by mouth daily.    Yes [provider]  furosemide (LASIX) 20 MG tablet Take 1 tablet (20 mg total) by mouth daily. Patient taking differently: Take 40 mg by mouth daily. Taking 40mg  daily per MD on 09-19-18 07/18/18  Yes Skeet Latch, MD  gabapentin (NEURONTIN) 300  MG capsule Take 300 mg by mouth 3 (three) times daily.    Yes [provider]  glipiZIDE (GLUCOTROL XL) 10 MG 24 hr tablet Take 10 mg by mouth 2 (two) times a day.   Yes [provider]  linaclotide (LINZESS) 145 MCG CAPS capsule Take 145 mcg by mouth as needed (constipation).   Yes [provider]  oxyCODONE-acetaminophen (PERCOCET/ROXICET) 5-325 MG tablet Take 1 tablet by mouth every 6 (six) hours as needed for moderate pain or severe pain.    Yes [provider]  pantoprazole (PROTONIX) 40 MG tablet Take 1 tablet (40 mg total) by mouth daily. 07/14/18  Yes Skeet Latch, MD  triamcinolone cream (KENALOG) 0.1 % Apply 1 application topically daily as needed (itching).   Yes [provider]  glipiZIDE (GLUCOTROL) 10 MG tablet Take 1 tablet (10 mg total) by mouth 2 (two) times daily before a meal. Patient not taking: Reported on 09/10/2018 08/27/15   Gale Journey, Damaris Hippo, PA-C  polyethylene glycol (MIRALAX / GLYCOLAX) packet Take 17 g by mouth daily as needed for mild constipation. Patient not taking: Reported on 09/10/2018 04/14/18   Bonnell Public, MD    Family History Family History  Problem Relation Age of Onset   Arthritis Mother    Hypertension Mother    Heart failure Mother    Emphysema Mother    Arthritis Maternal Grandmother    Hypertension Maternal Grandmother    Stroke Maternal Grandfather    Cancer Sister        unknown type cancer   Heart failure Sister    Lung disease Sister    Heart failure Sister    Colon cancer Neg Hx    Esophageal cancer Neg Hx    Stomach cancer Neg Hx    Rectal cancer Neg Hx     Social History Social History   Tobacco Use   Smoking status: Current Every Day Smoker    Packs/day: 0.50    Years: 39.00    Pack years: 19.50    Types: Cigarettes   Smokeless tobacco: Never Used   Tobacco comment: 1 pack last 3 days  Substance Use Topics   Alcohol use: No    Alcohol/week: 0.0  standard drinks   Drug use: Not Currently    Types: Marijuana     Allergies   Aspirin, Allopurinol, Amlodipine, Lisinopril, and Other   Review of Systems Review of Systems  Constitutional: Negative for fever.  Respiratory: Positive for cough and shortness of breath.   Cardiovascular: Positive for chest pain and leg swelling.  Gastrointestinal: Negative for abdominal pain.  All other systems reviewed and are negative.    Physical Exam Updated Vital Signs BP (!) 210/96    Pulse 76    Temp 98.7 F (37.1 C) (Oral)    Resp (!) 23    SpO2 97%  Physical Exam Vitals signs and nursing note reviewed.  Constitutional:      General: She is not in acute distress.    Appearance: She is well-developed. She is obese. She is not ill-appearing or diaphoretic.  HENT:     Head: Normocephalic and atraumatic.     Right Ear: External ear normal.     Left Ear: External ear normal.     Nose: Nose normal.  Eyes:     General:        Right eye: No discharge.        Left eye: No discharge.  Cardiovascular:     Rate and Rhythm: Normal rate and regular rhythm.     Heart sounds: Normal heart sounds.  Pulmonary:     Effort: Pulmonary effort is normal. Tachypnea present. No accessory muscle usage or respiratory distress.     Breath sounds: Examination of the right-upper field reveals wheezing. Examination of the left-upper field reveals wheezing. Examination of the right-lower field reveals wheezing and rales. Examination of the left-lower field reveals wheezing and rales. Wheezing and rales present.  Abdominal:     Palpations: Abdomen is soft.     Tenderness: There is no abdominal tenderness.  Musculoskeletal:     Right lower leg: Edema (pitting) present.     Left lower leg: Edema (pitting) present.  Skin:    General: Skin is warm and dry.  Neurological:     Mental Status: She is alert.  Psychiatric:        Mood and Affect: Mood is not anxious.      ED Treatments / Results  Labs (all  labs ordered are listed, but only abnormal results are displayed) Labs Reviewed  COMPREHENSIVE METABOLIC PANEL - Abnormal; Notable for the following components:      Result Value   Glucose, Bld 196 (*)    Creatinine, Ser 1.99 (*)    Calcium 8.6 (*)    Albumin 2.9 (*)    AST 12 (*)    GFR calc non Af Amer 26 (*)    GFR calc Af Amer 30 (*)    All other components within normal limits  BRAIN NATRIURETIC PEPTIDE - Abnormal; Notable for the following components:   B Natriuretic Peptide 1,721.0 (*)    All other components within normal limits  TROPONIN I (HIGH SENSITIVITY) - Abnormal; Notable for the following components:   Troponin I (High Sensitivity) 27 (*)    All other components within normal limits  TROPONIN I (HIGH SENSITIVITY) - Abnormal; Notable for the following components:   Troponin I (High Sensitivity) 28 (*)    All other components within normal limits  CBC WITH DIFFERENTIAL/PLATELET - Abnormal; Notable for the following components:   RBC 3.49 (*)    Hemoglobin 9.3 (*)    HCT 30.2 (*)    All other components within normal limits  SARS CORONAVIRUS 2 (HOSPITAL ORDER, [facility])  CREATININE, SERUM    EKG EKG Interpretation  Date/Time:  Monday September 22 2018 09:56:57 EDT Ventricular Rate:  81 PR Interval:    QRS Duration: 84 QT Interval:  414 QTC Calculation: 481 R Axis:   39 Text Interpretation:  Sinus rhythm Ventricular premature complex Borderline prolonged QT interval no significant change since May 2020 Confirmed by Sherwood Gambler [phone]) on 09/22/2018 12:06:34 PM   Radiology Dg Chest Portable 1 View  Result Date: 09/22/2018 CLINICAL DATA:  Bilateral leg swelling. Left breast swelling. Shortness of breath. Dyspnea at rest. Congestive  heart failure. EXAM: PORTABLE CHEST 1 VIEW COMPARISON:  One-view chest x-ray 07/29/2018 FINDINGS: The heart is enlarged. Interstitial edema is present. Bibasilar airspace disease likely reflects atelectasis.  Small effusions are suspected. A right-sided PICC line is in place. This terminates at the cavoatrial junction. IMPRESSION: 1. Cardiomegaly with mild edema and bilateral effusions compatible with congestive heart failure. 2. Bibasilar airspace opacities likely reflect atelectasis. Electronically Signed   By: San Morelle M.D.   On: 09/22/2018 10:27    Procedures Procedures (including critical care time)  Medications Ordered in ED Medications  aspirin chewable tablet 324 mg (324 mg Oral Refused 09/22/18 1021)  nitroGLYCERIN (NITROSTAT) SL tablet 0.4 mg (0.4 mg Sublingual Given 09/22/18 1057)  carvedilol (COREG) tablet 3.125 mg (has no administration in time range)  hydrALAZINE (APRESOLINE) injection 20 mg (has no administration in time range)  sodium chloride flush (NS) 0.9 % injection 3 mL (has no administration in time range)  sodium chloride flush (NS) 0.9 % injection 3 mL (has no administration in time range)  0.9 %  sodium chloride infusion (has no administration in time range)  acetaminophen (TYLENOL) tablet 650 mg (has no administration in time range)  ondansetron (ZOFRAN) injection 4 mg (has no administration in time range)  heparin injection 5,000 Units (has no administration in time range)  furosemide (LASIX) injection 40 mg (has no administration in time range)  potassium chloride SA (K-DUR) CR tablet 10 mEq (has no administration in time range)  zolpidem (AMBIEN) tablet 5 mg (has no administration in time range)  insulin aspart (novoLOG) injection 0-20 Units (has no administration in time range)  insulin aspart (novoLOG) injection 0-5 Units (has no administration in time range)  insulin aspart (novoLOG) injection 4 Units (has no administration in time range)  albuterol (VENTOLIN HFA) 108 (90 Base) MCG/ACT inhaler 2 puff (2 puffs Inhalation Given 09/22/18 1027)  cloNIDine (CATAPRES) tablet 0.2 mg (0.2 mg Oral Given 09/22/18 1156)  furosemide (LASIX) injection 40 mg (40 mg  Intravenous Given 09/22/18 1153)     Initial Impression / Assessment and Plan / ED Course  I have reviewed the triage vital signs and the nursing notes.  Pertinent labs & imaging results that were available during my care of the patient were reviewed by me and considered in my medical decision making (see chart for details).        Patient's presentation is consistent with CHF exacerbation.  She was given nitroglycerin for her significant hypertension with chest pain.I think the chest pain is probably not ACS.  Testing for COVID-19 is negative.  She will be given IV diuresis.  She is tachypneic but not in distress.  She will need her blood pressure controlled and medications changed.  It sounds like she is not very compliant at baseline.  Admit to hospitalist service.  Candise Bowens Phylicia Mcgaugh was evaluated in Emergency Department on 09/22/2018 for the symptoms described in the history of present illness. She was evaluated in the context of the global COVID-19 pandemic, which necessitated consideration that the patient might be at risk for infection with the SARS-CoV-2 virus that causes COVID-19. Institutional protocols and algorithms that pertain to the evaluation of patients at risk for COVID-19 are in a state of rapid change based on information released by regulatory bodies including the CDC and federal and state organizations. These policies and algorithms were followed during the patient's care in the ED.  Final Clinical Impressions(s) / ED Diagnoses   Final diagnoses:  Acute on chronic congestive heart  failure, unspecified heart failure type [facility])    ED Discharge Orders    None       Sherwood Gambler, MD 09/22/18 1325

## 2018-09-22 NOTE — Consult Note (Signed)
Cardiology Consultation:   Patient ID: Zoe Clarke MRN: [id number]; DOB: [birth year]  Admit date: 09/22/2018 Date of Consult: 09/22/2018  Primary Care Provider: Antony Contras, MD Primary Cardiologist: Skeet Latch, MD  Primary Electrophysiologist:  None    Patient Profile:   Zoe Clarke is a 68 y.o. female with a hx of chronic diastolic heart failure, hypertension, chronic kidney disease stage III, diabetes, asthma and GERD who is being seen today for the evaluation of CHF at the request of Dr. Evangeline Gula.  History of Present Illness:   Zoe Clarke has a past medical history as above.  She was recently admitted to the hospital 07/29/2018-07/30/2018 with acute on chronic diastolic heart failure.  He had shortness of breath and wheezing.  BNP was 720.  She was diuresed with IV Lasix however her creatinine bumped up to 2.10.  Diuretics were held at that time.  Her dry weight was reportedly 238 pounds and on the day of discharge her weight was 246 pounds.  Her symptoms had improved.  She had a pharmacologic nuclear stress test on 07/30/2018 that showed no evidence of prior infarct or induced ischemia.  There was severe LV dilatation, left ventricular global hypokinesis and reduced EF of 42%.  Last echocardiogram 04/28/2018 showed EF 60-65%, mildly increased left ventricular wall thickness, impaired diastolic relaxation with elevated mean left atrial pressure.  There was mild aortic valve regurgitation.  She was suspected to have possible sleep apnea and felt to benefit from an outpatient sleep study.    She was seen in the office on 08/06/2018, posthospitalization. She was cleared to have gallbladder surgery at the time. She was having no chest discomfort or shortness of breath complaints at that visit.  Zoe Clarke presented to the ED today for evaluation of progressively worsening shortness of breath over the past few days and intermittent chest pain today.  She was also noted to have  peripheral edema in the lower extremities and in her left breast.  She had stopped taking her Lasix because she felt it was not helping and felt like it might have been making her swelling worse.  She last took it last week.  BNP today is 1721.  High-sensitivity troponin is 27.  CXR shows mild edema and bilateral effusions compatible with congestive heart failure. COVID-19 test negative  Upon my exam Zoe Clarke is a little hard to follow as to her history of current symptoms. She says that her husband was sick last week and she was under increased stress. She notes that starting last Wednesday she was getting very tired with minimal activity. She had to lay down on that day to rest and awoke "gasping" for air. She notes that she has had 2 weeks of pedal edema. She saw her PCP and lasix was increased from 20 mg to 40 mg daily. This did not help her swelling so she stopped taking it. She has been able to sleep on a flat bed but has slept poorly and has awakened several times including last night "gasping for air" and wheezing. She thought it was bronchitis and tried her inhaler which helped a little. She has had DOE with walking in the house, but says she if fine when walking out side. She had some chest tightness this am that lasted for a few minutes. She has also had chest tightness while in the ED that resolved with SL NTG. She says that her BP has been high at home so she does not like to check  it and see the high numbers. She did eat fried catfish and Flounder last week from a restaurant but denies using salt on her foods at home. She denies excess fluid intake at home, more than 8 cups per day.   Zoe Clarke states that other than stopping her lasix, she takes her other meds as ordered, although she takes her clonidine about every 3 hours instead of split evenly throughout the day.    Dry wt 238 lbs.   Heart Pathway Score:     Past Medical History:  Diagnosis Date  . Allergy   . Arthritis   .  Asthma    weazing at night uses inhalers  . Bronchitis    history of  . Chronic diastolic CHF (congestive heart failure) ([place]) 04/2018  . CKD (chronic kidney disease), stage III ([place])   . Diabetes mellitus without complication ([place])   . Gastric ulcer   . GERD (gastroesophageal reflux disease)   . History of hiatal hernia   . Hypertension   . Osteoporosis   . Sleep apnea 09/22/2018    Past Surgical History:  Procedure Laterality Date  . DILATION AND CURETTAGE OF UTERUS    . EYE SURGERY Left    removed cataract w/ lens  . TUBAL LIGATION       Home Medications:  Prior to Admission medications   Medication Sig Start Date End Date Taking? Authorizing Provider  albuterol (PROVENTIL HFA;VENTOLIN HFA) 108 (90 Base) MCG/ACT inhaler Inhale 2 puffs into the lungs every 6 (six) hours as needed for wheezing or shortness of breath. 04/14/18  Yes Bonnell Public, MD  atorvastatin (LIPITOR) 40 MG tablet Take 40 mg by mouth daily.   Yes [provider]  carvedilol (COREG) 3.125 MG tablet Take 1 tablet (3.125 mg total) by mouth 2 (two) times daily with a meal. 07/18/18  Yes Skeet Latch, MD  cloNIDine (CATAPRES) 0.2 MG tablet Take 1 tablet (0.2 mg total) by mouth 3 (three) times daily. 07/18/18  Yes Skeet Latch, MD  cyclobenzaprine (FLEXERIL) 5 MG tablet Take 5 mg by mouth at bedtime.   Yes [provider]  ergocalciferol (VITAMIN D2) 1.25 MG (50000 UT) capsule Take 50,000 Units by mouth once a week.   Yes [provider]  fexofenadine (ALLEGRA) 180 MG tablet Take 180 mg by mouth daily.    Yes [provider]  furosemide (LASIX) 20 MG tablet Take 1 tablet (20 mg total) by mouth daily. Patient taking differently: Take 40 mg by mouth daily. Taking 40mg  daily per MD on 09-19-18 07/18/18  Yes Skeet Latch, MD  gabapentin (NEURONTIN) 300 MG capsule Take 300 mg by mouth 3 (three) times daily.    Yes [provider]  glipiZIDE (GLUCOTROL XL) 10 MG 24  hr tablet Take 10 mg by mouth 2 (two) times a day.   Yes [provider]  linaclotide (LINZESS) 145 MCG CAPS capsule Take 145 mcg by mouth as needed (constipation).   Yes [provider]  oxyCODONE-acetaminophen (PERCOCET/ROXICET) 5-325 MG tablet Take 1 tablet by mouth every 6 (six) hours as needed for moderate pain or severe pain.    Yes [provider]  pantoprazole (PROTONIX) 40 MG tablet Take 1 tablet (40 mg total) by mouth daily. 07/14/18  Yes Skeet Latch, MD  triamcinolone cream (KENALOG) 0.1 % Apply 1 application topically daily as needed (itching).   Yes [provider]  glipiZIDE (GLUCOTROL) 10 MG tablet Take 1 tablet (10 mg total) by mouth 2 (  two) times daily before a meal. Patient not taking: Reported on 09/10/2018 08/27/15   Gale Journey, Damaris Hippo, PA-C  polyethylene glycol (MIRALAX / GLYCOLAX) packet Take 17 g by mouth daily as needed for mild constipation. Patient not taking: Reported on 09/10/2018 04/14/18   Dana Allan I, MD    Inpatient Medications: Scheduled Meds: . aspirin  324 mg Oral Once  . carvedilol  3.125 mg Oral Once  . furosemide  40 mg Intravenous BID  . heparin  5,000 Units Subcutaneous Q8H  . hydrALAZINE  20 mg Intravenous STAT  . insulin aspart  0-20 Units Subcutaneous TID WC  . insulin aspart  0-5 Units Subcutaneous QHS  . insulin aspart  4 Units Subcutaneous TID WC  . potassium chloride  10 mEq Oral BID  . sodium chloride flush  3 mL Intravenous Q12H   Continuous Infusions: . sodium chloride     PRN Meds: sodium chloride, acetaminophen, nitroGLYCERIN, ondansetron (ZOFRAN) IV, sodium chloride flush, zolpidem  Allergies:    Allergies  Allergen Reactions  . Aspirin Other (See Comments)    Feels funny   . Allopurinol     Dizzy; hard to ambulate  Couldn't see; sweating  . Amlodipine Other (See Comments)    headache  . Lisinopril Swelling    Face swelling  . Other Itching    Squash    Social History:   Social  History   Socioeconomic History  . Marital status: Married    Spouse name: Not on file  . Number of children: 2  . Years of education: 80  . Highest education level: Not on file  Occupational History  . Occupation: Housewife  Social Needs  . Financial resource strain: Not on file  . Food insecurity    Worry: Not on file    Inability: Not on file  . Transportation needs    Medical: Not on file    Non-medical: Not on file  Tobacco Use  . Smoking status: Current Every Day Smoker    Packs/day: 0.50    Years: 39.00    Pack years: 19.50    Types: Cigarettes  . Smokeless tobacco: Never Used  . Tobacco comment: 1 pack last 3 days  Substance and Sexual Activity  . Alcohol use: No    Alcohol/week: 0.0 standard drinks  . Drug use: Not Currently    Types: Marijuana  . Sexual activity: Not on file  Lifestyle  . Physical activity    Days per week: Not on file    Minutes per session: Not on file  . Stress: Not on file  Relationships  . Social Herbalist on phone: Not on file    Gets together: Not on file    Attends religious service: Not on file    Active member of club or organization: Not on file    Attends meetings of clubs or organizations: Not on file    Relationship status: Not on file  . Intimate partner violence    Fear of current or ex partner: Not on file    Emotionally abused: Not on file    Physically abused: Not on file    Forced sexual activity: Not on file  Other Topics Concern  . Not on file  Social History Narrative   Fun: Race video car games - PS2/3, paint by number, board and card games, puzzle books   Feels safe at home and denies abuse   Disabled - does not work from chronic pain  Children - 1 living children, 3 step children   Grandchildren - 59   Married - lives with husband   Uses [employer] transportation    Family History:    Family History  Problem Relation Age of Onset  . Arthritis Mother   . Hypertension Mother   . Heart failure  Mother   . Emphysema Mother   . Arthritis Maternal Grandmother   . Hypertension Maternal Grandmother   . Stroke Maternal Grandfather   . Cancer Sister        unknown type cancer  . Heart failure Sister   . Lung disease Sister   . Heart failure Sister   . Colon cancer Neg Hx   . Esophageal cancer Neg Hx   . Stomach cancer Neg Hx   . Rectal cancer Neg Hx      ROS:  Please see the history of present illness.   All other ROS reviewed and negative.     Physical Exam/Data:   Vitals:   09/22/18 1115 09/22/18 1130 09/22/18 1145 09/22/18 1156  BP: (!) 215/98 (!) 211/74 (!) 188/141 (!) 213/95  Pulse: 70 83 78   Resp: 15 (!) 23 (!) 22   Temp:      TempSrc:      SpO2: 97% 99% 97%    No intake or output data in the 24 hours ending 09/22/18 1308 Last 3 Weights 09/17/2018 08/06/2018 07/30/2018  Weight (lbs) 239 lb 1 oz 238 lb 246 lb 0.5 oz  Weight (kg) 108.438 kg 107.956 kg 111.6 kg     There is no height or weight on file to calculate BMI.  General:  Well nourished, well developed, in no acute distress HEENT: normal Lymph: no adenopathy Neck: mild JVD Endocrine:  No thryomegaly Vascular: No carotid bruits; Pedal pulses 2+  Cardiac:  normal S1, S2; RRR; no murmur  Lungs:  clear to auscultation bilaterally, Few crackles in bases Abd: soft, nontender, no hepatomegaly  Ext: 1+ lower ext edema Musculoskeletal:  No deformities, BUE and BLE strength normal and equal Skin: warm and dry  Neuro:  CNs 2-12 intact, no focal abnormalities noted Psych:  Normal affect   EKG:  The EKG was personally reviewed and demonstrates:  Sinus rhythm, 81 bpm, with PVC, No significant change from previous.  Telemetry:  SR 80's with PACs and PVCS   Relevant CV Studies:  Stress Myoview 07/30/2018 IMPRESSION: 1. no definitive scintigraphic evidence of prior infarction pharmacologically induced ischemia. 2. Severe left ventricular dilatation. 3. Global left ventricular hypokinesia. Left ventricular  ejection fraction-42%   Echocardiogram 04/28/2018 IMPRESSIONS   1. The left ventricle has normal systolic function of 54-00%. The cavity size was normal. There is mildly increased left ventricular wall thickness. Echo evidence of impaired diastolic relaxation Elevated mean left atrial pressure.  2. The right ventricle has normal systolic function. The cavity was normal. There is no increase in right ventricular wall thickness.  3. Left atrial size was mildly dilated.  4. The mitral valve is normal in structure. There is mild thickening.  5. The tricuspid valve is normal in structure.  6. The aortic valve is normal in structure. Aortic valve regurgitation is mild by color flow Doppler.  7. The inferior vena cava was dilated in size with >50% respiratory variability.   Laboratory Data:  High Sensitivity Troponin:   Recent Labs  Lab 09/22/18 1014  TROPONINIHS 27*     Cardiac EnzymesNo results for input(s): TROPONINI in the last 168 hours. No results for  input(s): TROPIPOC in the last 168 hours.  Chemistry Recent Labs  Lab 09/17/18 1000 09/22/18 1014  NA 141 143  K 3.8 4.1  CL 110 106  CO2 22 27  GLUCOSE 196* 196*  BUN 13 15  CREATININE 1.91* 1.99*  CALCIUM 8.6* 8.6*  GFRNONAA 27* 26*  GFRAA 31* 30*  ANIONGAP 9 10    Recent Labs  Lab 09/22/18 1014  PROT 6.8  ALBUMIN 2.9*  AST 12*  ALT 15  ALKPHOS 106  BILITOT 0.7   Hematology Recent Labs  Lab 09/17/18 1000 09/22/18 1014  WBC 8.0 8.6  RBC 3.58* 3.49*  HGB 9.5* 9.3*  HCT 31.3* 30.2*  MCV 87.4 86.5  MCH 26.5 26.6  MCHC 30.4 30.8  RDW 15.0 15.0  PLT 255 241   BNP Recent Labs  Lab 09/22/18 1014  BNP 1,721.0*    DDimer No results for input(s): DDIMER in the last 168 hours.   Radiology/Studies:  Dg Chest Portable 1 View  Result Date: 09/22/2018 CLINICAL DATA:  Bilateral leg swelling. Left breast swelling. Shortness of breath. Dyspnea at rest. Congestive heart failure. EXAM: PORTABLE CHEST 1 VIEW  COMPARISON:  One-view chest x-ray 07/29/2018 FINDINGS: The heart is enlarged. Interstitial edema is present. Bibasilar airspace disease likely reflects atelectasis. Small effusions are suspected. A right-sided PICC line is in place. This terminates at the cavoatrial junction. IMPRESSION: 1. Cardiomegaly with mild edema and bilateral effusions compatible with congestive heart failure. 2. Bibasilar airspace opacities likely reflect atelectasis. Electronically Signed   By: San Morelle M.D.   On: 09/22/2018 10:27    Assessment and Plan:   1. Acute on chronic diastolic heart failure -Pt with at least 2 weeks of pedal edema, PND, DOE. Lasix was increased by PCP, however, Pt perceived no improvement in edema so she stopped taking it. Questionable dietary sodium indiscretions.  -BNP today is 1721.  High-sensitivity troponin is 27.  CXR shows mild edema and bilateral effusions compatible with congestive heart failure. -Dry weight 238 pounds -Continue home carvedilol 3.125 mg twice daily -Daily weight and strict intake and output. Low sodium diet.  -Agree with starting Lasix 40 mg IV twice daily.   2. Hypertension uncontrolled -Blood pressures significantly elevated 177- 217/95- 110 -Continue home carvedilol and clonidine.  She has not taken her medicines this morning.  -Patient has allergy to ACE and ARB. She did not like amlodipine in the past as she says it caused her headache.  -As needed hydralazine has been ordered.  She has been given 20 mg at 1326. BP is improving, 175/92. -Pt states compliance with home meds. She does note that her BP is elevated at home.  -Continue to monitor and adjust medications as needed. Will start NTG drip for chest pain and BP control. Consider addition of hydralazine/isosorbide later- start at 20mg -37.5 mg TID and titrate as needed in 2 weeks.   3. Chest pain -Pt had chest tightness this am. Improved with SL NTG and improved BP. -No acute ischemia on EKG.   Leane Call on 07/30/2018 showed no inducible ischemia or scar.  -High sensitivity troponins only mildly elevated at 27 and 28 with no significant increase over 2 hours. Consistent with CHF and hypertensive urgency.  -Likely related to her significantly elevated BP.  -Will repeat EKG. Start Nitroglycerin drip for both chest pain and BP control.  -Continue to monitor symptoms.   4. CKD stage III -Serum creatinine 1.99.  Her creatinine rose to 2.10 with diuresis during last hospitalization in  May. -Follow renal function closely with diuresis  5. Possible sleep apnea -Patient reportedly snores and wakes up in the middle of the night with shortness of breath -And for outpatient sleep study  6.  Diabetes type 2 -Sliding scale insulin while in the hospital. -A1c 8.1.  Patient needs improved control with target closer to 7.  -Pt reports that her home blood sugars run high. Needs follow up with PCP for better management.   7. Symptomatic cholelithiasis -Cleared for surgery from cardiac standpoint with normal stress test in 07/2018. She is planned for surgery on 09/24/2018. Should be postponed until improved fluid status and blood pressure better managed.      For questions or updates, please contact [place] Please consult [email or web address] for contact info under     Signed, Daune Perch, NP  09/22/2018 1:08 PM

## 2018-09-22 NOTE — H&P (Signed)
History and Physical    Zoe Clarke [id number]:[id number] DOB: [birth year] DOA: 09/22/2018  PCP: Antony Contras, MD  Patient coming from: Home  I have personally briefly reviewed patient's old medical records in [place]  Chief Complaint: Shortness of breath and swelling of bilateral lower extremities and left breast worsened over the past week  HPI: Zoe Clarke is a 68 y.o. 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  Furthermore she has problems with swelling of the left breast.  Review of medical record reveals recent contacts with her primary care doctor's office regarding swelling of her lower extremities.  Patient was advised to increase her Lasix and continue her medications and discussions were had with her regarding her fluid intake.  He reported drinking half a gallon of fluid a day usually but has been drinking more due to the heat.  Patient felt that it was her Lasix causing her to swell so she stopped her Lasix.  She also stopped her clonidine.  She has been feeling short of breath for the last few days.  She is has peripheral edema in her lower extremities and her left breast.  Last took her Lasix last week.  This morning she did not take any of her medications.  She has been having some cough with white phlegm production and wheezing.  And while she is a smoker she has never had an exacerbation of COPD.  She is not hypoxic in the emergency department.  Her blood pressure is shockingly elevated at 213/95.  ED Course: Coreg and clonidine ordered, Lasix ordered, chest x-ray consistent with mild bilateral pleural effusions and  congestive heart failure, BNP elevated at 1721 (a full 1000 higher than highest level recorded on the patient), anion 1.99 (baseline), high-sensitivity troponin of 27, globin of 9.3 and stable, I was consulted for further evaluation and management.  Review of Systems: As per HPI otherwise all other systems reviewed and  negative.    Past Medical History:  Diagnosis Date   Allergy    Arthritis    Asthma    weazing at night uses inhalers   Bronchitis    history of   Chronic diastolic CHF (congestive heart failure) ([place]) 04/2018   CKD (chronic kidney disease), stage III (HCC)    Diabetes mellitus without complication (HCC)    Gastric ulcer    GERD (gastroesophageal reflux disease)    History of hiatal hernia    Hypertension    Osteoporosis    Sleep apnea 09/22/2018    Past Surgical History:  Procedure Laterality Date   DILATION AND CURETTAGE OF UTERUS     EYE SURGERY Left    removed cataract w/ lens   TUBAL LIGATION      Social History   Social History Narrative   Fun: Race video car games - PS2/3, paint by number, board and card games, puzzle books   Feels safe at home and denies abuse   Disabled - does not work from chronic pain   Children - 1 living children, 3 step children   Grandchildren - 41   Married - lives with husband   Uses [employer] transportation  reports that she has been smoking cigarettes. She has a 19.50 pack-year smoking history. She has never used smokeless tobacco. She reports previous drug use. Drug: Marijuana. She reports that she does not drink alcohol.  Allergies  Allergen Reactions   Aspirin Other (See Comments)    Feels funny    Allopurinol     Dizzy; hard to ambulate  Couldn't see; sweating   Amlodipine Other (See Comments)    headache   Lisinopril Swelling    Face swelling   Other Itching    Squash    Family History  Problem Relation Age of Onset   Arthritis Mother    Hypertension Mother    Heart failure  Mother    Emphysema Mother    Arthritis Maternal Grandmother    Hypertension Maternal Grandmother    Stroke Maternal Grandfather    Cancer Sister        unknown type cancer   Heart failure Sister    Lung disease Sister    Heart failure Sister    Colon cancer Neg Hx    Esophageal cancer Neg Hx    Stomach cancer Neg Hx    Rectal cancer Neg Hx     Prior to Admission medications   Medication Sig Start Date End Date Taking? Authorizing Provider  albuterol (PROVENTIL HFA;VENTOLIN HFA) 108 (90 Base) MCG/ACT inhaler Inhale 2 puffs into the lungs every 6 (six) hours as needed for wheezing or shortness of breath. 04/14/18  Yes Bonnell Public, MD  atorvastatin (LIPITOR) 40 MG tablet Take 40 mg by mouth daily.   Yes [provider]  carvedilol (COREG) 3.125 MG tablet Take 1 tablet (3.125 mg total) by mouth 2 (two) times daily with a meal. 07/18/18  Yes Skeet Latch, MD  cloNIDine (CATAPRES) 0.2 MG tablet Take 1 tablet (0.2 mg total) by mouth 3 (three) times daily. 07/18/18  Yes Skeet Latch, MD  cyclobenzaprine (FLEXERIL) 5 MG tablet Take 5 mg by mouth at bedtime.   Yes [provider]  ergocalciferol (VITAMIN D2) 1.25 MG (50000 UT) capsule Take 50,000 Units by mouth once a week.   Yes [provider]  fexofenadine (ALLEGRA) 180 MG tablet Take 180 mg by mouth daily.    Yes [provider]  furosemide (LASIX) 20 MG tablet Take 1 tablet (20 mg total) by mouth daily. Patient taking differently: Take 40 mg by mouth daily. Taking 40mg  daily per MD on 09-19-18 07/18/18  Yes Skeet Latch, MD  gabapentin (NEURONTIN) 300 MG capsule Take 300 mg by mouth 3 (three) times daily.    Yes [provider]  glipiZIDE (GLUCOTROL XL) 10 MG 24 hr tablet Take 10 mg by mouth 2 (two) times a day.   Yes [provider]  linaclotide (LINZESS) 145 MCG CAPS capsule Take 145 mcg by mouth as needed (constipation).   Yes [provider]    oxyCODONE-acetaminophen (PERCOCET/ROXICET) 5-325 MG tablet Take 1 tablet by mouth every 6 (six) hours as needed for moderate pain or severe pain.    Yes [provider]  pantoprazole (PROTONIX) 40 MG tablet Take 1 tablet (40 mg total) by mouth daily. 07/14/18  Yes Skeet Latch, MD  triamcinolone cream (KENALOG) 0.1 % Apply 1 application topically daily as needed (itching).   Yes [provider]  glipiZIDE (GLUCOTROL) 10 MG tablet Take 1 tablet (10 mg total) by mouth 2 (two) times daily before a meal. Patient not taking: Reported on 09/10/2018 08/27/15   Mancel Bale, PA-C  polyethylene glycol (MIRALAX / GLYCOLAX) packet Take 17 g by mouth daily as needed for mild constipation. Patient not taking: Reported on 09/10/2018 04/14/18   Bonnell Public, MD    Physical Exam:  Constitutional:-year-old female appears 17 years older than her stated age with moderate to word dyspnea and inability to out the bed without shortness of breath.  Positive accessory muscle use. Vitals:   09/22/18 1115 09/22/18 1130 09/22/18 1145 09/22/18 1156  BP: (!) 215/98 (!) 211/74 (!) 188/141 (!) 213/95  Pulse: 70 83 78   Resp: 15 (!) 23 (!) 22   Temp:      TempSrc:      SpO2: 97% 99% 97%    Eyes: PERRL, lids and conjunctivae normal ENMT: Mucous membranes are moist. Posterior pharynx clear of any exudate or lesions.Normal dentition.  Neck: normal, supple, no masses, no thyromegaly Respiratory: Decreased breath sounds at the bases bilaterally with dullness to percussion and end expiratory wheezing consistent with "cardiac asthma", increased respiratory effort.  Minimal accessory muscle use.  Cardiovascular: Regular rate and rhythm, no murmurs / rubs / gallops.  Edema of dependent areas including pannus and breasts (doughy edema) extremity edema. 2+ pedal pulses. No carotid bruits.  Abdomen: no tenderness, no masses palpated. No hepatosplenomegaly. Bowel sounds positive.  Super morbidly  obese Musculoskeletal: no clubbing / cyanosis. No joint deformity upper and lower extremities. Good ROM, no contractures. Normal muscle tone.  Skin: no rashes, lesions, ulcers. No induration, areas of excess skin suggesting weight loss notably in the hips Neurologic: CN 2-12 grossly intact. Sensation intact, DTR normal. Strength 5/5 in all 4.  Psychiatric: Normal judgment and insight. Alert and oriented x 3. Normal mood.    Labs on Admission: I have personally reviewed following labs and imaging studies  CBC: Recent Labs  Lab 09/17/18 1000 09/22/18 1014  WBC 8.0 8.6  NEUTROABS  --  6.1  HGB 9.5* 9.3*  HCT 31.3* 30.2*  MCV 87.4 86.5  PLT 255 115   Basic Metabolic Panel: Recent Labs  Lab 09/17/18 1000 09/22/18 1014  NA 141 143  K 3.8 4.1  CL 110 106  CO2 22 27  GLUCOSE 196* 196*  BUN 13 15  CREATININE 1.91* 1.99*  CALCIUM 8.6* 8.6*   GFR: Estimated Creatinine Clearance: 35.9 mL/min (A) (by C-G formula based on SCr of 1.99 mg/dL (H)). Liver Function Tests: Recent Labs  Lab 09/22/18 1014  AST 12*  ALT 15  ALKPHOS 106  BILITOT 0.7  PROT 6.8  ALBUMIN 2.9*   CBG: Recent Labs  Lab 09/17/18 0832  GLUCAP 203*   Urine analysis:    Component Value Date/Time   COLORURINE YELLOW 04/27/2018 1334   APPEARANCEUR CLEAR 04/27/2018 1334   LABSPEC 1.012 04/27/2018 1334   PHURINE 7.0 04/27/2018 1334   GLUCOSEU 50 (A) 04/27/2018 1334   HGBUR SMALL (A) 04/27/2018 1334   BILIRUBINUR NEGATIVE 04/27/2018 1334   BILIRUBINUR negative 02/25/2015 1426   KETONESUR NEGATIVE 04/27/2018 1334   PROTEINUR 100 (A) 04/27/2018 1334   UROBILINOGEN 0.2 02/25/2015 1426   UROBILINOGEN 1.0 04/16/2009 0943   NITRITE NEGATIVE 04/27/2018 1334   LEUKOCYTESUR NEGATIVE 04/27/2018 1334    Radiological Exams on Admission: Dg Chest Portable 1 View  Result Date: 09/22/2018 CLINICAL DATA:  Bilateral leg swelling. Left breast swelling. Shortness of breath. Dyspnea at rest. Congestive heart failure.  EXAM: PORTABLE CHEST 1 VIEW COMPARISON:  One-view chest x-ray 07/29/2018 FINDINGS: The heart is enlarged. Interstitial edema is present. Bibasilar airspace disease likely  reflects atelectasis. Small effusions are suspected. A right-sided PICC line is in place. This terminates at the cavoatrial junction. IMPRESSION: 1. Cardiomegaly with mild edema and bilateral effusions compatible with congestive heart failure. 2. Bibasilar airspace opacities likely reflect atelectasis. Electronically Signed   By: San Morelle M.D.   On: 09/22/2018 10:27    EKG: Independently reviewed.  Sinus rhythm at 81 bpm with ventricular premature complexes elevated QT interval but no significant change since May 2020.  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  Acute on chronic combined systolic congestive and diastolic congestive heart failure secondary to hypertensive heart and kidney disease with acute combined systolic and diastolic congestive heart failure and  stage 3 chronic kidney disease:   Due to massively elevated blood pressure with hypertensive emergency causing acute heart failure patient will be admitted into the progressive care unit.  IV Lasix has been ordered.  I will give her a dose of hydralazine pending restarting her home medications.  I believe she may be having a running of her poorly controlled hypertension due to having stopped her clonidine with a rebound effect.   We will give her IV Lasix twice daily.  Check daily weights.  And start a fluid restriction.  Will require extensive counseling regarding daily weights and recording them as well as contacting MD if weight gets greater than 3 pounds in 1 day or 1 week.  She will also need extensive education regarding fluid restriction and dietary modifications for heart failure.  2.  Diabetes mellitus type 2 uncontrolled with complications: Blood glucose 196.  Will place patient on sliding scale insulin coverage.  Suspect hospital diet will correct her sugars.  3.  Stage III chronic kidney disease due to type 2 diabetes mellitus: We will avoid nephrotoxic agents and monitor renal function closely.  4.  Anemia due to chronic renal failure stage III: Iron studies done about a year ago were unremarkable suggesting a chronic anemia possibly related to her renal disease.  Would recommend outpatient further follow-up.  5.  Thyroid nodule: Sound of the thyroid done the end of June showed a nodule that would require fine-needle aspiration.  Would recommend patient follow-up with primary care physician to arrange outpatient biopsy.  6.  Current tobacco use with tobacco abuse counseling: Patient counseled to quit smoking regarding her heart failure and her diabetes.  I spent 5 minutes discussing smoking cessation.  7.  Suspected sleep apnea: With probable sleep apnea.  Dr. Skeet Latch ordered a sleep study which was done in the beginning of June.  Dr. Claiborne Billings was supposed to read it.  I am having difficulty locating results.  Would ask cardiology to inform.  DVT prophylaxis: Subcu heparin Code Status: DO NOT INTUBATE otherwise perform other resuscitative measures. Family Communication: Found husband Odaly Peri at [phone] left message to call. Disposition Plan: Home in 3 to 4 days Consults called: Cardiology Dr. Debara Pickett Admission status: Inpatient Admit - It is my clinical opinion  that admission to INPATIENT is  reasonable and necessary because of the expectation that this patient will require hospital care that crosses at least 2 midnights to treat this condition based on the medical complexity of the problems presented.  Given the aforementioned information, the predictability of an adverse outcome is felt to be significant.   Lady Deutscher MD FACP [facility] Pager [phone]  How to contact the [facility] Attending or Consulting provider [place] or covering provider during after hours [place], for this patient?  1. Check the care team in [facility] and look for a) attending/consulting [facility] provider listed and b) the [facility] team listed 2. Log into [email or web address] and use [place] universal password to access. If you do not have the password, please contact the hospital operator. 3. Locate the [facility] provider you are looking for under [facility] and page to a number that you can be directly reached. 4. If you still have difficulty reaching the provider, please page the [facility] (Director on Call) for the Hospitalists listed on amion for assistance.  If 7PM-7AM, please contact night-coverage [email or web address] Password [email or web address]  09/22/2018, 1:05 PM

## 2018-09-23 ENCOUNTER — Ambulatory Visit: Payer: PPO | Admitting: Adult Health

## 2018-09-23 ENCOUNTER — Inpatient Hospital Stay (HOSPITAL_COMMUNITY): Payer: PPO

## 2018-09-23 DIAGNOSIS — I160 Hypertensive urgency: Secondary | ICD-10-CM

## 2018-09-23 LAB — BASIC METABOLIC PANEL
Anion gap: 10 (ref 5–15)
BUN: 15 mg/dL (ref 8–23)
CO2: 26 mmol/L (ref 22–32)
Calcium: 8.5 mg/dL — ABNORMAL LOW (ref 8.9–10.3)
Chloride: 104 mmol/L (ref 98–111)
Creatinine, Ser: 1.94 mg/dL — ABNORMAL HIGH (ref 0.44–1.00)
GFR calc Af Amer: 31 mL/min — ABNORMAL LOW (ref >60)
GFR calc non Af Amer: 26 mL/min — ABNORMAL LOW (ref >60)
Glucose, Bld: 129 mg/dL — ABNORMAL HIGH (ref 70–99)
Potassium: 3.8 mmol/L (ref 3.5–5.1)
Sodium: 140 mmol/L (ref 135–145)

## 2018-09-23 LAB — GLUCOSE, CAPILLARY
Glucose-Capillary: 142 mg/dL — ABNORMAL HIGH (ref 70–99)
Glucose-Capillary: 123 mg/dL — ABNORMAL HIGH (ref 70–99)
Glucose-Capillary: 83 mg/dL (ref 70–99)
Glucose-Capillary: 116 mg/dL — ABNORMAL HIGH (ref 70–99)

## 2018-09-23 LAB — MAGNESIUM: Magnesium: 1.8 mg/dL (ref 1.7–2.4)

## 2018-09-23 LAB — MRSA PCR SCREENING: MRSA by PCR: NEGATIVE

## 2018-09-23 LAB — BRAIN NATRIURETIC PEPTIDE: B Natriuretic Peptide: 1496 pg/mL — ABNORMAL HIGH (ref 0.0–100.0)

## 2018-09-23 MED ORDER — CARVEDILOL 3.125 MG PO TABS
3.1250 mg | ORAL_TABLET | Freq: Two times a day (BID) | ORAL | Status: DC
  Administered 2018-09-23 – 2018-09-25 (×5): 3.125 mg via ORAL
  Filled 2018-09-23 (×5): qty 1

## 2018-09-23 MED ORDER — OXYCODONE-ACETAMINOPHEN 5-325 MG PO TABS
1.0000 | ORAL_TABLET | Freq: Four times a day (QID) | ORAL | Status: DC | PRN
  Administered 2018-09-23 – 2018-09-30 (×17): 1 via ORAL
  Filled 2018-09-23 (×19): qty 1

## 2018-09-23 MED ORDER — FUROSEMIDE 10 MG/ML IJ SOLN
80.0000 mg | Freq: Two times a day (BID) | INTRAMUSCULAR | Status: DC
  Administered 2018-09-23: 80 mg via INTRAVENOUS
  Filled 2018-09-23 (×2): qty 8

## 2018-09-23 MED ORDER — HYDRALAZINE HCL 50 MG PO TABS
25.0000 mg | ORAL_TABLET | Freq: Three times a day (TID) | ORAL | Status: DC
  Administered 2018-09-23 – 2018-09-24 (×3): 25 mg via ORAL
  Filled 2018-09-23 (×3): qty 1

## 2018-09-23 MED ORDER — ISOSORBIDE MONONITRATE ER 30 MG PO TB24
30.0000 mg | ORAL_TABLET | Freq: Every day | ORAL | Status: DC
  Administered 2018-09-23 – 2018-09-24 (×2): 30 mg via ORAL
  Filled 2018-09-23 (×2): qty 1

## 2018-09-23 NOTE — Consult Note (Signed)
   [facility] CM Inpatient Consult   09/23/2018  Zoe Clarke 07-18-1950 [id number]  Patient was screened for unplanned readmissions, 4 hospitalizations noted.  HTA member: Followed by [facility] for Care Management needs for Heart Failure.  Patient care management needs will be met by [facility] and TOC by the primary care provider.  Sign off.  Natividad Brood, RN BSN [facility] Liaison  [phone] business mobile phone Toll free office [phone]  Fax number: [phone] [email or web address]

## 2018-09-23 NOTE — Telephone Encounter (Signed)
Zoe Clarke can you complete your note, I am unable to close this chart

## 2018-09-23 NOTE — Telephone Encounter (Signed)
Pt is current admitted in the hospital

## 2018-09-23 NOTE — Plan of Care (Signed)

## 2018-09-23 NOTE — Evaluation (Signed)
Physical Therapy Evaluation Patient Details Name: Zoe Clarke MRN: [id number] DOB: 02-15-1951 Today's Date: 09/23/2018   History of Present Illness   Zoe Clarke is a 68 y.o. 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   Clinical Impression  Pt admitted with/for bil LE edema and SOB.  Pt presently needing mod to min assist.  Pt currently limited functionally due to the problems listed. ( See problems list.)   Pt will benefit from PT to maximize function and safety in order to get ready for next venue listed below.     Follow Up Recommendations SNF;Supervision/Assistance - 24 hour;Other (comment)(pt is not convinced she will go to SNF)    Equipment Recommendations  Other (comment)(pt wishing to continue documentation for power chair.)    Recommendations for Other Services       Precautions / Restrictions Precautions Precautions: Fall      Mobility  Bed Mobility Overal bed mobility: Needs Assistance Bed Mobility: Supine to Sit     Supine to sit: Mod assist     General bed mobility comments: Pt assisted up and to EOB with truncal assist and assist with pad to slide to EOB  Transfers Overall transfer level: Needs assistance   Transfers: Sit to/from Stand;Stand Pivot Transfers Sit to Stand: Min assist Stand pivot transfers: Min assist       General transfer comment: cues for hand placement and to come forward.    Ambulation/Gait Ambulation/Gait assistance: Min assist Gait Distance (Feet): 15 Feet Assistive device: Rolling walker (2 wheeled) Gait Pattern/deviations: Step-to pattern     General Gait Details: short unsteady steps with moderate use of the RW.   Pt was able ot align her legs to ambulate when in sitting they are crossed and not properly align for standing.  Stairs            Wheelchair Mobility    Modified Rankin (Stroke Patients Only)       Balance Overall balance assessment: Needs assistance   Sitting balance-Leahy Scale: Fair       Standing balance-Leahy Scale: Poor Standing balance comment: reliant on the RW                             Pertinent Vitals/Pain Pain Assessment: Faces Faces Pain Scale: Hurts even more Pain Location: bil hips Pain Descriptors / Indicators: Aching;Discomfort;Grimacing;Guarding Pain Intervention(s): Monitored during session;Limited activity within patient's tolerance    Home Living Family/patient expects to be discharged to:: Private residence Living Arrangements: Spouse/significant other Available Help at Discharge: Family;Available 24 hours/day Type of Home: House Home Access: Stairs to enter Entrance Stairs-Rails: Left Entrance Stairs-Number of Steps: 3 Home Layout: One level Home Equipment: Bedside commode;Walker - 2 wheels;Cane - single point;Walker - 4 wheels;Grab bars - tub/shower      Prior Function Level of Independence: Independent with assistive device(s)               Hand Dominance   Dominant Hand: Left    Extremity/Trunk Assessment   Upper Extremity Assessment Upper Extremity Assessment: Defer to OT evaluation    Lower Extremity Assessment Lower Extremity Assessment: Generalized weakness;RLE deficits/detail;LLE deficits/detail RLE Deficits / Details: stiff and  painful at hips RLE Coordination: decreased fine motor LLE Deficits / Details: stiff and painful, grossly 3+/5 LLE Coordination: decreased fine motor       Communication   Communication: No difficulties  Cognition Arousal/Alertness: Awake/alert Behavior During Therapy: WFL for tasks assessed/performed Overall Cognitive Status: Impaired/Different from baseline                                  General Comments: confused and disoriented, knows it and tries to correct.      General Comments      Exercises     Assessment/Plan    PT Assessment Patient needs continued PT services  PT Problem List Decreased strength;Decreased activity tolerance;Decreased balance;Decreased knowledge of use of DME;Decreased mobility;Pain       PT Treatment Interventions DME instruction;Gait training;Functional mobility training;Therapeutic activities;Balance training;Patient/family education    PT Goals (Current goals can be found in the Care Plan section)  Acute Rehab PT Goals Patient Stated Goal: get a power chair PT Goal Formulation: With patient Time For Goal Achievement: 10/07/18 Potential to Achieve Goals: Good    Frequency Min 3X/week   Barriers to discharge        Co-evaluation               AM-PAC PT "6 Clicks" Mobility  Outcome Measure Help needed turning from your back to your side while in a flat bed without using bedrails?: A Lot Help needed moving from lying on your back to sitting on the side of a flat bed without using bedrails?: A Lot Help needed moving to and from a bed to a chair (including a wheelchair)?: A Little Help needed standing up from a chair using your arms (e.g., wheelchair or bedside chair)?: A Little Help needed to walk in hospital room?: A Little Help needed climbing 3-5 steps with a railing? : A Lot 6 Click Score: 15    End of Session   Activity Tolerance: Patient tolerated treatment well;Patient limited by pain Patient left: in chair;with call bell/phone within reach Nurse Communication: Mobility status PT Visit Diagnosis: Unsteadiness on feet (R26.81);Muscle weakness (generalized) (M62.81);Pain Pain - part of body: Hip(bil)    Time: 7356-7014 PT Time Calculation (min) (ACUTE ONLY): 30 min   Charges:   PT Evaluation $PT Eval Moderate Complexity: 1 Mod PT Treatments $Gait Training: 8-22 mins         09/23/2018  Donnella Sham, PT Acute Rehabilitation Services [phone]  (pager) [phone]  (office)  Tessie Fass Kiing Deakin 09/23/2018, 2:31 PM

## 2018-09-23 NOTE — Progress Notes (Signed)
PROGRESS NOTE    Zoe Clarke  AGT:[id number] DOB: 10-Apr-1950 DOA: 09/22/2018 PCP: Antony Contras, MD   Brief Narrative:  Patient is a 68 year old female with history of chronic diastolic CHF, CKD stage III, diabetes type 2 with renal manifestation, hypertension who presented to the emergency room with complaints of bilateral lower extremity edema , shortness of breath, orthopnea/PND.  She was supposed to be on Lasix at home which she was not taking and was also drinking a lot of water.  Patient esd admitted for the management of CHF exacerbation.  She was also found to be severely hypertensive on  presentation.  Cardiology following.  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  Also drinking a lot of water.  Presented with elevated BNP. Counseled about importance of water restriction.  Presented with elevated BNP Monitor input/output.  Daily weight Start on Lasix 80 mg twice daily IV. Water restriction to less than 1200 mL a day. Cardiology following.Chest Xray had shown cardiomegaly with mild edema and bilateral effusions compatible with congestive heart failure Her last echocardiogram on 2/17 showed ejection fraction of 66-65%, impaired diastolic relaxation.  Diabetes type 2: Continue sliding scale insulin.  On glipizide at home.  Hemoglobin A1c as per 09/17/2018 is 8.1   CKD stage III: Baseline creatinine around 2.  Currently kidney function on baseline.  Continue to monitor  Normocytic anemia: Secondary to chronic kidney disease.  Recommend outpatient follow-up  Thyroid nodule: Ultrasound of the thyroid done at the end of June showed a nodule that requires fine-needle aspiration.  Follow-up with ENT as an outpatient through the referral of primary care physician.  Hypertension: Hypertensive emergency on presentation .Now started on Imdur, hydralazine.  Nitroglycerin drip stopped.  Also on Coreg.  Current tobacco use: Currently smoking.  A pack lasts for 3 days.  Counseled for cessation.  Suspected sleep apnea: Sleep study as an outpatient.        DVT prophylaxis: Heparin subcu Code Status: Full Family Communication: Discussed with the patient Disposition Plan: Home in 2 to 3 days   Consultants: Cardiology  Procedures: None  Antimicrobials:  Anti-infectives (From admission, onward)   None      Subjective:  Patient seen and examined the bedside this morning.  Hemodynamically stable.  Blood pressure much better during my evaluation.  She feels better this morning.  Denies any shortness of breath or chest pain.  Lower extremity edema have improved.  Objective: Vitals:   09/23/18 0800 09/23/18 1048 09/23/18 1100 09/23/18 1536  BP: (!) 84/55 (!) 161/75 (!) 163/74 (!) 163/67  Pulse:  91 83 65  Resp:  18 17 12   Temp:  98.4 F (36.9 C)  98 F (36.7 C)  TempSrc:  Oral  Oral  SpO2:  95% 96% 91%  Weight:      Height:        Intake/Output Summary (Last 24  hours) at 09/23/2018 1602 Last data filed at 09/23/2018 1050 Gross per 24 hour  Intake 817.24 ml  Output 1875 ml  Net -1057.76 ml   Filed Weights   09/22/18 1945  Weight: 116.5 kg    Examination:  General exam: Not in distress, Morbidly obese HEENT:PERRL,Oral mucosa moist, Ear/Nose normal on gross exam Respiratory system: Bilateral decreased air entry on the bases  Cardiovascular system: S1 & S2 heard, RRR. Elevated JVD, No murmurs, rubs, gallops or clicks. No pedal edema. Gastrointestinal system: Abdomen is nondistended, soft and nontender. No organomegaly or masses felt. Normal bowel sounds heard. Central nervous system: Alert and oriented. No focal neurological deficits. Extremities: 2-3+ lower extremity  edema, no clubbing ,no cyanosis, distal peripheral pulses palpable. Skin: No rashes, lesions or ulcers,no icterus ,no pallor  Data Reviewed: I have personally reviewed following labs and imaging studies  CBC: Recent Labs  Lab 09/17/18 1000 09/22/18 1014  WBC 8.0 8.6  NEUTROABS  --  6.1  HGB 9.5* 9.3*  HCT 31.3* 30.2*  MCV 87.4 86.5  PLT 255 578   Basic Metabolic Panel: Recent Labs  Lab 09/17/18 1000 09/22/18 1014 09/23/18 0154  NA 141 143 140  K 3.8 4.1 3.8  CL 110 106 104  CO2 22 27 26   GLUCOSE 196* 196* 129*  BUN 13 15 15   CREATININE 1.91* 1.99* 1.94*  CALCIUM 8.6* 8.6* 8.5*  MG  --   --  1.8   GFR: Estimated Creatinine Clearance: 38.2 mL/min (A) (by C-G formula based on SCr of 1.94 mg/dL (H)). Liver Function Tests: Recent Labs  Lab 09/22/18 1014  AST 12*  ALT 15  ALKPHOS 106  BILITOT 0.7  PROT 6.8  ALBUMIN 2.9*   No results for input(s): LIPASE, AMYLASE in the last 168 hours. No results for input(s): AMMONIA in the last 168 hours. Coagulation Profile: No results for input(s): INR, PROTIME in the last 168 hours. Cardiac Enzymes: No results for input(s): CKTOTAL, CKMB, CKMBINDEX, TROPONINI in the last 168 hours. BNP (last 3 results) No results for input(s): PROBNP in the last 8760 hours. HbA1C: No results for input(s): HGBA1C in the last 72 hours. CBG: Recent Labs  Lab 09/17/18 0832 09/22/18 1736 09/22/18 2112 09/23/18 0645 09/23/18 1052  GLUCAP 203* 187* 86 142* 123*   Lipid Profile: No results for input(s): CHOL, HDL, LDLCALC, TRIG, CHOLHDL, LDLDIRECT in the last 72 hours. Thyroid Function Tests:  No results for input(s): TSH, T4TOTAL, FREET4, T3FREE, THYROIDAB in the last 72 hours. Anemia Panel: No results for input(s): VITAMINB12, FOLATE, FERRITIN, TIBC, IRON, RETICCTPCT in the last 72 hours. Sepsis Labs: No results for input(s): PROCALCITON, LATICACIDVEN in the last 168 hours.  Recent Results (from the past 240 hour(s))  SARS Coronavirus 2 (CEPHEID - Performed in [place] hospital lab), Hosp Order     Status: None   Collection Time: 09/22/18 10:19 AM   Specimen: Nasopharyngeal Swab  Result Value Ref Range Status   SARS Coronavirus 2 NEGATIVE NEGATIVE Final    Comment: (NOTE) If result is NEGATIVE SARS-CoV-2 target nucleic acids are NOT DETECTED. The SARS-CoV-2 RNA is generally detectable in upper and lower  respiratory specimens during the acute phase of infection. The lowest  concentration of SARS-CoV-2 viral copies this assay can detect is 250  copies / mL. A negative result does not preclude SARS-CoV-2 infection  and should not be used as the sole basis for treatment or other  patient management decisions.  A negative result may occur with  improper specimen collection / handling, submission of specimen other  than nasopharyngeal swab, presence of viral mutation(s) within the  areas targeted by this assay, and inadequate number of viral copies  (<250 copies / mL). A negative result must be combined with clinical  observations, patient history, and epidemiological information. If result is POSITIVE SARS-CoV-2 target nucleic acids are DETECTED. The SARS-CoV-2 RNA is generally detectable in upper and lower  respiratory specimens dur ing the acute phase of infection.  Positive  results are indicative of active infection with SARS-CoV-2.  Clinical  correlation with patient history and other diagnostic information is  necessary to determine patient infection status.  Positive results do  not rule out bacterial infection or co-infection with other viruses. If result is  PRESUMPTIVE POSTIVE SARS-CoV-2 nucleic acids MAY BE PRESENT.   A presumptive positive result was obtained on the submitted specimen  and confirmed on repeat testing.  While 2019 novel coronavirus  (SARS-CoV-2) nucleic acids may be present in the submitted sample  additional confirmatory testing may be necessary for epidemiological  and / or clinical management purposes  to differentiate between  SARS-CoV-2 and other Sarbecovirus currently known to infect humans.  If clinically indicated additional testing with an alternate test  methodology [phone]) is advised. The SARS-CoV-2 RNA is generally  detectable in upper and lower respiratory sp ecimens during the acute  phase of infection. The expected result is Negative. Fact Sheet for Patients:  StrictlyIdeas.no Fact Sheet for Healthcare Providers: BankingDealers.co.za This test is not yet approved or cleared by the [place] FDA and has been authorized for detection and/or diagnosis of SARS-CoV-2 by FDA under an Emergency Use Authorization (EUA).  This EUA will remain in effect (meaning this test can be used) for the duration of the COVID-19 declaration under Section 564(b)(1) of the Act, 21 U.S.C. section 360bbb-3(b)(1), unless the authorization is terminated or revoked sooner. Performed at [facility], [place] [address], [place], [place] [address]   MRSA PCR Screening     Status: None   Collection Time: 09/22/18 10:18 PM   Specimen: Nasopharyngeal  Result Value Ref Range Status   MRSA by PCR NEGATIVE NEGATIVE Final    Comment:        The GeneXpert MRSA Assay (FDA approved for NASAL specimens only), is one component of a comprehensive MRSA colonization surveillance program. It is not intended to diagnose MRSA infection nor to guide or monitor treatment for MRSA infections. Performed at [facility], [place] [address] [place], [place] [address]          Radiology  Studies: Dg Chest 2 View  Result Date: 09/23/2018 CLINICAL DATA:  68 year old female with a history EXAM: CHEST - 2 VIEW COMPARISON:  09/22/2018 07/29/2018 FINDINGS: Cardiomediastinal silhouette enlarged. Fullness in the central vasculature. Interlobular septal thickening. Opacities at the bilateral lung bases with meniscus on the lateral view. No pneumothorax. IMPRESSION: CHF with small pleural effusions. Electronically Signed   By: Corrie Mckusick D.O.   On: 09/23/2018 08:04   Dg Chest Portable 1 View  Result Date: 09/22/2018 CLINICAL DATA:  Bilateral leg swelling. Left breast swelling. Shortness of breath. Dyspnea at rest. Congestive heart failure. EXAM: PORTABLE CHEST 1 VIEW COMPARISON:  One-view chest x-ray 07/29/2018 FINDINGS: The heart is enlarged. Interstitial edema is present. Bibasilar airspace disease likely reflects atelectasis. Small effusions are suspected. A right-sided PICC line is in place. This terminates at the cavoatrial junction. IMPRESSION: 1. Cardiomegaly with mild edema and bilateral effusions compatible with  congestive heart failure. 2. Bibasilar airspace opacities likely reflect atelectasis. Electronically Signed   By: San Morelle M.D.   On: 09/22/2018 10:27        Scheduled Meds: . aspirin  324 mg Oral Once  . carvedilol  3.125 mg Oral BID WC  . furosemide  80 mg Intravenous BID  . heparin  5,000 Units Subcutaneous Q8H  . hydrALAZINE  25 mg Oral Q8H  . insulin aspart  0-20 Units Subcutaneous TID WC  . insulin aspart  0-5 Units Subcutaneous QHS  . insulin aspart  4 Units Subcutaneous TID WC  . isosorbide mononitrate  30 mg Oral Daily  . potassium chloride  10 mEq Oral BID  . sodium chloride flush  3 mL Intravenous Q12H   Continuous Infusions: . sodium chloride       LOS: 1 day    Time spent: 35 mins.More than 50% of that time was spent in counseling and/or coordination of care.      Shelly Coss, MD [facility] Pager [phone]  If  7PM-7AM, please contact night-coverage [email or web address] Password [email or web address] 09/23/2018, 4:02 PM

## 2018-09-23 NOTE — Progress Notes (Signed)
DAILY PROGRESS NOTE   Patient Name: Zoe Clarke Date of Encounter: 09/23/2018 Cardiologist: Skeet Latch, MD  Chief Complaint   Breathing better today  Patient Profile   Zoe Clarke is a 68 y.o. female with a hx of chronic diastolic heart failure, hypertension, chronic kidney disease stage III, diabetes, asthma and GERD who is being seen today for the evaluation of CHF at the request of Dr. Evangeline Gula.  Subjective   Diuresed 1L overnight - BP improved, ?low BP this am at 84/55 - otherwise 628-315 systolic. BNP 1496. Troponin flat at 28. Creatinine stable 1.9-2.0 with diuresis. CXR today shows CHF with bilateral pleural effusions. More comfortable today - says chest pain has resolved. Drowsy from pain med.  Objective   Vitals:   09/23/18 0325 09/23/18 0630 09/23/18 0750 09/23/18 0800  BP: (!) 167/80 (!) 142/55 (!) 154/62 (!) 84/55  Pulse: (!) 110 (!) 103 90   Resp: 17 17 16    Temp: 98.1 F (36.7 C)  98.5 F (36.9 C)   TempSrc: Oral  Oral   SpO2: 96% 94% 92%   Weight:      Height:        Intake/Output Summary (Last 24 hours) at 09/23/2018 0948 Last data filed at 09/23/2018 1761 Gross per 24 hour  Intake 820.24 ml  Output 1900 ml  Net -1079.76 ml   Filed Weights   09/22/18 1945  Weight: 116.5 kg    Physical Exam   General appearance: alert and no distress Neck: JVD - 3 cm above sternal notch, no carotid bruit and thyroid not enlarged, symmetric, no tenderness/mass/nodules Lungs: diminished breath sounds bilaterally and rales bibasilar Heart: regular rate and rhythm Abdomen: soft, non-tender; bowel sounds normal; no masses,  no organomegaly and obese Extremities: extremities normal, atraumatic, no cyanosis or edema Pulses: 2+ and symmetric Skin: Skin color, texture, turgor normal. No rashes or lesions Neurologic: Grossly normal Psych: Pleasant  Inpatient Medications    Scheduled Meds: . aspirin  324 mg Oral Once  . furosemide  40 mg Intravenous  BID  . heparin  5,000 Units Subcutaneous Q8H  . insulin aspart  0-20 Units Subcutaneous TID WC  . insulin aspart  0-5 Units Subcutaneous QHS  . insulin aspart  4 Units Subcutaneous TID WC  . potassium chloride  10 mEq Oral BID  . sodium chloride flush  3 mL Intravenous Q12H    Continuous Infusions: . sodium chloride    . nitroGLYCERIN 105 mcg/min (09/23/18 0622)    PRN Meds: sodium chloride, acetaminophen, nitroGLYCERIN, ondansetron (ZOFRAN) IV, oxyCODONE-acetaminophen, sodium chloride flush, zolpidem   Labs   Results for orders placed or performed during the hospital encounter of 09/22/18 (from the past 48 hour(s))  Comprehensive metabolic panel     Status: Abnormal   Collection Time: 09/22/18 10:14 AM  Result Value Ref Range   Sodium 143 135 - 145 mmol/L   Potassium 4.1 3.5 - 5.1 mmol/L   Chloride 106 98 - 111 mmol/L   CO2 27 22 - 32 mmol/L   Glucose, Bld 196 (H) 70 - 99 mg/dL   BUN 15 8 - 23 mg/dL   Creatinine, Ser 1.99 (H) 0.44 - 1.00 mg/dL   Calcium 8.6 (L) 8.9 - 10.3 mg/dL   Total Protein 6.8 6.5 - 8.1 g/dL   Albumin 2.9 (L) 3.5 - 5.0 g/dL   AST 12 (L) 15 - 41 U/L   ALT 15 0 - 44 U/L   Alkaline Phosphatase 106 38 - 126  U/L   Total Bilirubin 0.7 0.3 - 1.2 mg/dL   GFR calc non Af Amer 26 (L) >60 mL/min   GFR calc Af Amer 30 (L) >60 mL/min   Anion gap 10 5 - 15    Comment: Performed at [facility] [address] [place], [place] [address]  Brain natriuretic peptide     Status: Abnormal   Collection Time: 09/22/18 10:14 AM  Result Value Ref Range   B Natriuretic Peptide 1,721.0 (H) 0.0 - 100.0 pg/mL    Comment: Performed at [facility] [address], [place], [place] [address]  Troponin I (High Sensitivity)     Status: Abnormal   Collection Time: 09/22/18 10:14 AM  Result Value Ref Range   Troponin I (High Sensitivity) 27 (H) <18 ng/L    Comment: (NOTE) Elevated high sensitivity troponin I (hsTnI) values and significant  changes across serial  measurements may suggest ACS but many other  chronic and acute conditions are known to elevate hsTnI results.  Refer to the "Links" section for chest pain algorithms and additional  guidance. Performed at [facility], [place] [address] [place], [place] [address]   CBC with Differential     Status: Abnormal   Collection Time: 09/22/18 10:14 AM  Result Value Ref Range   WBC 8.6 4.0 - 10.5 K/uL   RBC 3.49 (L) 3.87 - 5.11 MIL/uL   Hemoglobin 9.3 (L) 12.0 - 15.0 g/dL   HCT 30.2 (L) 36.0 - 46.0 %   MCV 86.5 80.0 - 100.0 fL   MCH 26.6 26.0 - 34.0 pg   MCHC 30.8 30.0 - 36.0 g/dL   RDW 15.0 11.5 - 15.5 %   Platelets 241 150 - 400 K/uL   nRBC 0.0 0.0 - 0.2 %   Neutrophils Relative % 71 %   Neutro Abs 6.1 1.7 - 7.7 K/uL   Lymphocytes Relative 20 %   Lymphs Abs 1.7 0.7 - 4.0 K/uL   Monocytes Relative 6 %   Monocytes Absolute 0.5 0.1 - 1.0 K/uL   Eosinophils Relative 2 %   Eosinophils Absolute 0.1 0.0 - 0.5 K/uL   Basophils Relative 0 %   Basophils Absolute 0.0 0.0 - 0.1 K/uL   Immature Granulocytes 1 %   Abs Immature Granulocytes 0.04 0.00 - 0.07 K/uL    Comment: Performed at [facility] [address] [place], [place] [address]  SARS Coronavirus 2 (CEPHEID - Performed in [facility] hospital lab), Hosp Order     Status: None   Collection Time: 09/22/18 10:19 AM   Specimen: Nasopharyngeal Swab  Result Value Ref Range   SARS Coronavirus 2 NEGATIVE NEGATIVE    Comment: (NOTE) If result is NEGATIVE SARS-CoV-2 target nucleic acids are NOT DETECTED. The SARS-CoV-2 RNA is generally detectable in upper and lower  respiratory specimens during the acute phase of infection. The lowest  concentration of SARS-CoV-2 viral copies this assay can detect is 250  copies / mL. A negative result does not preclude SARS-CoV-2 infection  and should not be used as the sole basis for treatment or other  patient management decisions.  A negative result may occur with  improper specimen  collection / handling, submission of specimen other  than nasopharyngeal swab, presence of viral mutation(s) within the  areas targeted by this assay, and inadequate number of viral copies  (<250 copies / mL). A negative result must be combined with clinical  observations, patient history, and epidemiological  information. If result is POSITIVE SARS-CoV-2 target nucleic acids are DETECTED. The SARS-CoV-2 RNA is generally detectable in upper and lower  respiratory specimens dur ing the acute phase of infection.  Positive  results are indicative of active infection with SARS-CoV-2.  Clinical  correlation with patient history and other diagnostic information is  necessary to determine patient infection status.  Positive results do  not rule out bacterial infection or co-infection with other viruses. If result is PRESUMPTIVE POSTIVE SARS-CoV-2 nucleic acids MAY BE PRESENT.   A presumptive positive result was obtained on the submitted specimen  and confirmed on repeat testing.  While 2019 novel coronavirus  (SARS-CoV-2) nucleic acids may be present in the submitted sample  additional confirmatory testing may be necessary for epidemiological  and / or clinical management purposes  to differentiate between  SARS-CoV-2 and other Sarbecovirus currently known to infect humans.  If clinically indicated additional testing with an alternate test  methodology [phone]) is advised. The SARS-CoV-2 RNA is generally  detectable in upper and lower respiratory sp ecimens during the acute  phase of infection. The expected result is Negative. Fact Sheet for Patients:  StrictlyIdeas.no Fact Sheet for Healthcare Providers: BankingDealers.co.za This test is not yet approved or cleared by the [place] FDA and has been authorized for detection and/or diagnosis of SARS-CoV-2 by FDA under an Emergency Use Authorization (EUA).  This EUA will remain in effect  (meaning this test can be used) for the duration of the COVID-19 declaration under Section 564(b)(1) of the Act, 21 U.S.C. section 360bbb-3(b)(1), unless the authorization is terminated or revoked sooner. Performed at [facility], [place] [address], [place], [place] [address]   Troponin I (High Sensitivity)     Status: Abnormal   Collection Time: 09/22/18 12:24 PM  Result Value Ref Range   Troponin I (High Sensitivity) 28 (H) <18 ng/L    Comment: (NOTE) Elevated high sensitivity troponin I (hsTnI) values and significant  changes across serial measurements may suggest ACS but many other  chronic and acute conditions are known to elevate hsTnI results.  Refer to the "Links" section for chest pain algorithms and additional  guidance. Performed at [facility], [place] [address], [place], [place] [address]   Troponin I (High Sensitivity)     Status: Abnormal   Collection Time: 09/22/18  4:25 PM  Result Value Ref Range   Troponin I (High Sensitivity) 28 (H) <18 ng/L    Comment: (NOTE) Elevated high sensitivity troponin I (hsTnI) values and significant  changes across serial measurements may suggest ACS but many other  chronic and acute conditions are known to elevate hsTnI results.  Refer to the "Links" section for chest pain algorithms and additional  guidance. Performed at [facility], [place] [address], [place], [place] [address]   CBG monitoring, ED     Status: Abnormal   Collection Time: 09/22/18  5:36 PM  Result Value Ref Range   Glucose-Capillary 187 (H) 70 - 99 mg/dL  Glucose, capillary     Status: None   Collection Time: 09/22/18  9:12 PM  Result Value Ref Range   Glucose-Capillary 86 70 - 99 mg/dL   Comment 1 Notify RN    Comment 2 Document in Chart   MRSA PCR Screening     Status: None   Collection Time: 09/22/18 10:18 PM   Specimen: Nasopharyngeal  Result Value Ref Range   MRSA by PCR NEGATIVE NEGATIVE    Comment:  The GeneXpert MRSA Assay (FDA  approved for NASAL specimens only), is one component of a comprehensive MRSA colonization surveillance program. It is not intended to diagnose MRSA infection nor to guide or monitor treatment for MRSA infections. Performed at [facility], [place] [address], [place], [place] [address]   Basic metabolic panel     Status: Abnormal   Collection Time: 09/23/18  1:54 AM  Result Value Ref Range   Sodium 140 135 - 145 mmol/L   Potassium 3.8 3.5 - 5.1 mmol/L   Chloride 104 98 - 111 mmol/L   CO2 26 22 - 32 mmol/L   Glucose, Bld 129 (H) 70 - 99 mg/dL   BUN 15 8 - 23 mg/dL   Creatinine, Ser 1.94 (H) 0.44 - 1.00 mg/dL   Calcium 8.5 (L) 8.9 - 10.3 mg/dL   GFR calc non Af Amer 26 (L) >60 mL/min   GFR calc Af Amer 31 (L) >60 mL/min   Anion gap 10 5 - 15    Comment: Performed at [facility] [address] [place], [place] [address]  Magnesium     Status: None   Collection Time: 09/23/18  1:54 AM  Result Value Ref Range   Magnesium 1.8 1.7 - 2.4 mg/dL    Comment: Performed at [facility] [address], [place], [place] [address]  Brain natriuretic peptide     Status: Abnormal   Collection Time: 09/23/18  2:06 AM  Result Value Ref Range   B Natriuretic Peptide 1,496.0 (H) 0.0 - 100.0 pg/mL    Comment: Performed at [facility] [address], [place], [place] [address]  Glucose, capillary     Status: Abnormal   Collection Time: 09/23/18  6:45 AM  Result Value Ref Range   Glucose-Capillary 142 (H) 70 - 99 mg/dL   Comment 1 Notify RN    Comment 2 Document in Chart     ECG   N/A  Telemetry   Sinus rhythm - Personally Reviewed  Radiology    Dg Chest 2 View  Result Date: 09/23/2018 CLINICAL DATA:  68 year old female with a history EXAM: CHEST - 2 VIEW COMPARISON:  09/22/2018 07/29/2018 FINDINGS: Cardiomediastinal silhouette enlarged. Fullness in the central vasculature. Interlobular septal thickening. Opacities at the bilateral lung bases with meniscus on  the lateral view. No pneumothorax. IMPRESSION: CHF with small pleural effusions. Electronically Signed   By: Corrie Mckusick D.O.   On: 09/23/2018 08:04   Dg Chest Portable 1 View  Result Date: 09/22/2018 CLINICAL DATA:  Bilateral leg swelling. Left breast swelling. Shortness of breath. Dyspnea at rest. Congestive heart failure. EXAM: PORTABLE CHEST 1 VIEW COMPARISON:  One-view chest x-ray 07/29/2018 FINDINGS: The heart is enlarged. Interstitial edema is present. Bibasilar airspace disease likely reflects atelectasis. Small effusions are suspected. A right-sided PICC line is in place. This terminates at the cavoatrial junction. IMPRESSION: 1. Cardiomegaly with mild edema and bilateral effusions compatible with congestive heart failure. 2. Bibasilar airspace opacities likely reflect atelectasis. Electronically Signed   By: San Morelle M.D.   On: 09/22/2018 10:27    Cardiac Studies   None  Assessment   1. Principal Problem: 2.   Acute on chronic combined systolic (congestive) and diastolic (congestive) heart failure ([place]) 3. Active Problems: 4.   Diabetes mellitus type 2, uncontrolled, with complications ([place]) 5.   Current tobacco use 6.   Stage 3 chronic kidney disease due to type 2 diabetes mellitus ([place]) 7.  Tobacco abuse counseling 8.   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   Thyroid nodule 10.   Anemia, chronic renal failure, stage 3 (moderate) ([facility]) 11.   Sleep apnea 12.   Plan   1. Improved CHF - bp improved as well. Apparently scheduled for outpatient lap chole with Dr. Ninfa Linden tomorrow - will need to touch base with surgery. BP meds only ordered for one dose - not sure why, will reorder. Increase lasix to 80 mg BID -given creatinine 1.9 and persistent volume overload. Start imdur 30 mg daily and hydralazine 25 mg TID - wean off nitro gtts.  Time Spent Directly  with Patient:  I have spent a total of 35 minutes with the patient reviewing hospital notes, telemetry, EKGs, labs and examining the patient as well as establishing an assessment and plan that was discussed personally with the patient.  > 50% of time was spent in direct patient care.  Length of Stay:  LOS: 1 day   Pixie Casino, MD, [facility], [place] Director of the Advanced Lipid Disorders &  Cardiovascular Risk Reduction Clinic Diplomate of the American Board of Clinical Lipidology Attending Cardiologist  Direct Dial: [phone]  Fax: [phone]  Website:  [email or web address]  09/23/2018, 9:48 AM

## 2018-09-24 ENCOUNTER — Encounter (HOSPITAL_COMMUNITY): Admission: RE | Payer: Self-pay | Source: Ambulatory Visit

## 2018-09-24 ENCOUNTER — Ambulatory Visit (HOSPITAL_COMMUNITY): Admission: RE | Admit: 2018-09-24 | Payer: PPO | Source: Ambulatory Visit | Admitting: Surgery

## 2018-09-24 DIAGNOSIS — G4730 Sleep apnea, unspecified: Secondary | ICD-10-CM

## 2018-09-24 DIAGNOSIS — G4733 Obstructive sleep apnea (adult) (pediatric): Secondary | ICD-10-CM

## 2018-09-24 DIAGNOSIS — E041 Nontoxic single thyroid nodule: Secondary | ICD-10-CM

## 2018-09-24 LAB — BASIC METABOLIC PANEL
Anion gap: 11 (ref 5–15)
BUN: 19 mg/dL (ref 8–23)
CO2: 26 mmol/L (ref 22–32)
Calcium: 8.7 mg/dL — ABNORMAL LOW (ref 8.9–10.3)
Chloride: 101 mmol/L (ref 98–111)
Creatinine, Ser: 2.23 mg/dL — ABNORMAL HIGH (ref 0.44–1.00)
GFR calc Af Amer: 26 mL/min — ABNORMAL LOW (ref >60)
GFR calc non Af Amer: 22 mL/min — ABNORMAL LOW (ref >60)
Glucose, Bld: 95 mg/dL (ref 70–99)
Potassium: 4.1 mmol/L (ref 3.5–5.1)
Sodium: 138 mmol/L (ref 135–145)

## 2018-09-24 LAB — GLUCOSE, CAPILLARY
Glucose-Capillary: 107 mg/dL — ABNORMAL HIGH (ref 70–99)
Glucose-Capillary: 108 mg/dL — ABNORMAL HIGH (ref 70–99)
Glucose-Capillary: 109 mg/dL — ABNORMAL HIGH (ref 70–99)
Glucose-Capillary: 99 mg/dL (ref 70–99)

## 2018-09-24 SURGERY — LAPAROSCOPIC CHOLECYSTECTOMY
Anesthesia: General

## 2018-09-24 MED ORDER — HYDRALAZINE HCL 50 MG PO TABS
50.0000 mg | ORAL_TABLET | Freq: Three times a day (TID) | ORAL | Status: DC
  Administered 2018-09-24 – 2018-09-26 (×6): 50 mg via ORAL
  Filled 2018-09-24 (×6): qty 1

## 2018-09-24 MED ORDER — INSULIN ASPART 100 UNIT/ML ~~LOC~~ SOLN
8.0000 [IU] | Freq: Three times a day (TID) | SUBCUTANEOUS | Status: DC
  Administered 2018-09-26 – 2018-09-30 (×13): 8 [IU] via SUBCUTANEOUS

## 2018-09-24 MED ORDER — FUROSEMIDE 80 MG PO TABS
80.0000 mg | ORAL_TABLET | Freq: Every day | ORAL | Status: DC
  Administered 2018-09-25 – 2018-09-26 (×2): 80 mg via ORAL
  Filled 2018-09-24 (×2): qty 1

## 2018-09-24 MED ORDER — DIPHENHYDRAMINE HCL 25 MG PO CAPS
25.0000 mg | ORAL_CAPSULE | Freq: Four times a day (QID) | ORAL | Status: DC | PRN
  Administered 2018-09-24 – 2018-09-30 (×11): 25 mg via ORAL
  Filled 2018-09-24 (×11): qty 1

## 2018-09-24 MED ORDER — INSULIN ASPART 100 UNIT/ML ~~LOC~~ SOLN
0.0000 [IU] | SUBCUTANEOUS | Status: DC
  Administered 2018-09-25 (×2): 2 [IU] via SUBCUTANEOUS

## 2018-09-24 NOTE — Progress Notes (Signed)
Patient has been desatting on and off to the low to mid 80's while asleep. O2 at 1L [place] applied overnight.

## 2018-09-24 NOTE — Progress Notes (Signed)
Patient refused to get out of the bed to chair today. Explained patient that even cardiologist Dr. Debara Pickett told her increase activities. Hold lasix IVP today, so explained patient to remove purewick today and using the bedside commode. She refused to remove it, however, she will do out of bed to chair tomorrow. Continue to encourage patient to move. HS [employer]

## 2018-09-24 NOTE — Care Management (Signed)
Spoke to patient at bedside. We discussed HH vs SNF and support. SHe states that she lives at home with her spouse, has [facility] uses [employer] for transportation. SHe is unsure at this time if she wants [place] to SNF for rehab or use HH. SHe states she will speak with her spouse today and ecide based on their conversation. CM will continue to follow.

## 2018-09-24 NOTE — Progress Notes (Signed)
PROGRESS NOTE    Zoe Clarke  YQI:[id number] DOB: 1950-08-20 DOA: 09/22/2018 PCP: Antony Contras, MD   Brief Narrative:  68 y.o. 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  Furthermore she has problems with swelling of the left breast.  Review of medical record reveals recent contacts with her primary care doctor's office regarding swelling of her lower extremities.  Patient was advised to increase her Lasix and continue her medications and discussions were had with her regarding her fluid intake.  He reported drinking half a gallon of fluid a day usually but has been drinking more due to the heat.  Patient felt that it was her Lasix causing her to swell so she stopped her Lasix.  She also stopped her clonidine.  She has been feeling short of breath for the last few days.  She is has peripheral edema in her lower extremities and her left breast.  Last took her Lasix last week.  This morning she did not take any of her medications.  She has been having some cough with white phlegm production and wheezing.  And while she is a smoker she has never had an exacerbation of COPD.  She is not hypoxic in the emergency department.  Her blood pressure is shockingly elevated at 213/95.     Subjective: A/O x4, negative CP, negative S OB.  States positive LLQ abdominal pain (cannot reproduce on exam).  Unknown dry weight.  Does not weigh her self daily.  Dr. Oval Linsey is her primary cardiologist per patient.   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 (HCC)   Thyroid nodule   Anemia, chronic renal failure, stage 3 (moderate) (HCC)   Sleep apnea  Acute on chronic diastolic CHF - Strict in and out -Daily weight - Coreg 3.125 mg twice daily - Lasix 80 mg daily - 7/8 increase hydralazine 50 mg 3 times daily - If patient BP still not controlled with increased hydralazine will increase Imdur - NTG as needed   Essential HTN -See CHF   Diabetes type 2 uncontrolled with complication - 7/1 hemoglobin A1c= 8.1 - 7/8 increase NovoLog 8 units QAC - 7/8 decrease to moderate SSI  - NovoLog SSI nightly -Lipid panel pending  CKD stage III (baseline Cr ~2) -Monitor kidney function - Hold all nephrotoxic medication Recent Labs  Lab 09/22/18 1014 09/23/18 0154 09/24/18 0322  CREATININE 1.99* 1.94* 2.23*  - Patient slightly above baseline with diuresis, Lasix dose just changed today by cardiology will monitor closely.  Normocytic anemia? - Possibly secondary to CKD - Anemia panel pending   Thyroid nodule - Ultrasound thyroid at the end of June showed a nodule that requires FNA - Follow-up with ENT as an outpatient  through the referral of PCP, after patient completes SNF  Tobacco abuse - Counseled at length concerning absolute need to abstain from smoking given her multiple medical problems, patient unlikely to follow recommendations.   OSA? - Per patient was scheduled for an outpatient sleep study, will need to reschedule following completion of stay at SNF.    Goals of care -7/8 consult to social work; patient requires SNF upon discharge secondary to poor conditioning and acute on chronic diastolic CHF   DVT prophylaxis: Heparin Code  Status: Partial Family Communication: None Disposition Plan: SNF   Consultants:  Cardiology    Procedures/Significant Events:     I have personally reviewed and interpreted all radiology studies and my findings are as above.  VENTILATOR SETTINGS:    Cultures   Antimicrobials: Anti-infectives (From admission, onward)   None       Devices    LINES / TUBES:      Continuous Infusions: . sodium chloride       Objective: Vitals:   09/23/18 2312 09/24/18 0311 09/24/18 0536 09/24/18 0658  BP: (!) 145/74 (!) 166/79  (!) 157/79  Pulse: 63 94    Resp: 17 19    Temp: 98.2 F (36.8 C) 98.5 F (36.9 C)    TempSrc: Oral     SpO2: 95% 100%    Weight:   112.4 kg   Height:        Intake/Output Summary (Last 24 hours) at 09/24/2018 0800 Last data filed at 09/24/2018 0548 Gross per 24 hour  Intake 730 ml  Output 2375 ml  Net -1645 ml   Filed Weights   09/22/18 1945 09/24/18 0536  Weight: 116.5 kg 112.4 kg    Examination:  General: A/O x4, no acute respiratory distress Eyes: negative scleral hemorrhage, negative anisocoria, negative icterus ENT: Negative Runny nose, negative gingival bleeding, Neck:  Negative scars, masses, torticollis, lymphadenopathy, JVD Lungs: Clear to auscultation bilaterally without wheezes or crackles Cardiovascular: Regular rate and rhythm without murmur gallop or rub normal S1 and S2 Abdomen: Morbidly obese, negative abdominal pain (pain that patient complained of nonreproducible when patient distracted), nondistended, positive soft, bowel sounds, no rebound, no ascites, no appreciable mass Extremities: No significant cyanosis, clubbing, bilateral pedal edema 1-2+ Skin: Negative rashes, lesions, ulcers Psychiatric:  Negative depression, negative anxiety, negative fatigue, negative mania, poor understanding of disease process Central nervous system:  Cranial nerves II through XII intact, tongue/uvula midline, all extremities muscle  strength 5/5, sensation intact throughout, negative dysarthria, negative expressive aphasia, negative receptive aphasia. .     Data Reviewed: Care during the described time interval was provided by me .  I have reviewed this patient's available data, including medical history, events of note, physical examination, and all test results as part of my evaluation.   CBC: Recent Labs  Lab 09/17/18 1000 09/22/18 1014  WBC 8.0 8.6  NEUTROABS  --  6.1  HGB 9.5* 9.3*  HCT 31.3* 30.2*  MCV 87.4 86.5  PLT 255 086   Basic Metabolic Panel: Recent Labs  Lab 09/17/18 1000 09/22/18 1014 09/23/18 0154 09/24/18 0322  NA 141 143 140 138  K 3.8 4.1 3.8 4.1  CL 110 106 104 101  CO2 22 27 26 26   GLUCOSE 196* 196* 129* 95  BUN 13 15 15 19   CREATININE 1.91* 1.99* 1.94* 2.23*  CALCIUM 8.6* 8.6* 8.5* 8.7*  MG  --   --  1.8  --    GFR: Estimated Creatinine Clearance: 32.6 mL/min (A) (by C-G formula based on SCr of 2.23 mg/dL (H)).  Liver Function Tests: Recent Labs  Lab 09/22/18 1014  AST 12*  ALT 15  ALKPHOS 106  BILITOT 0.7  PROT 6.8  ALBUMIN 2.9*   No results for input(s): LIPASE, AMYLASE in the last 168 hours. No results for input(s): AMMONIA in the last 168 hours. Coagulation Profile: No results for input(s): INR, PROTIME in the last 168 hours. Cardiac Enzymes: No results for input(s): CKTOTAL, CKMB, CKMBINDEX, TROPONINI in the last 168 hours. BNP (last 3 results) No results for input(s): PROBNP in the last 8760 hours. HbA1C: No results for input(s): HGBA1C in the last 72 hours. CBG: Recent Labs  Lab 09/23/18 0645 09/23/18 1052 09/23/18 1624 09/23/18 2143 09/24/18 0552  GLUCAP 142* 123* 83 116* 107*   Lipid Profile: No results for input(s): CHOL, HDL, LDLCALC, TRIG, CHOLHDL, LDLDIRECT in the last 72 hours. Thyroid Function Tests: No results for input(s): TSH, T4TOTAL, FREET4, T3FREE, THYROIDAB in the last 72 hours. Anemia Panel: No results for input(s): VITAMINB12,  FOLATE, FERRITIN, TIBC, IRON, RETICCTPCT in the last 72 hours. Urine analysis:    Component Value Date/Time   COLORURINE YELLOW 04/27/2018 1334   APPEARANCEUR CLEAR 04/27/2018 1334   LABSPEC 1.012 04/27/2018 1334   PHURINE 7.0 04/27/2018 1334   GLUCOSEU 50 (A) 04/27/2018 1334   HGBUR SMALL (A) 04/27/2018 1334   BILIRUBINUR NEGATIVE 04/27/2018 1334   BILIRUBINUR negative 02/25/2015 1426   KETONESUR NEGATIVE 04/27/2018 1334   PROTEINUR 100 (A) 04/27/2018 1334   UROBILINOGEN 0.2 02/25/2015 1426   UROBILINOGEN 1.0 04/16/2009 0943   NITRITE NEGATIVE 04/27/2018 1334   LEUKOCYTESUR NEGATIVE 04/27/2018 1334   Sepsis Labs: @LABRCNTIP (procalcitonin:4,lacticidven:4)  ) Recent Results (from the past 240 hour(s))  SARS Coronavirus 2 (CEPHEID - Performed in [place] hospital lab), Hosp Order     Status: None   Collection Time: 09/22/18 10:19 AM   Specimen: Nasopharyngeal Swab  Result Value Ref Range Status   SARS Coronavirus 2 NEGATIVE NEGATIVE Final    Comment: (NOTE) If result is NEGATIVE SARS-CoV-2 target nucleic acids are NOT DETECTED. The SARS-CoV-2 RNA is generally detectable in upper and lower  respiratory specimens during the acute phase of infection. The lowest  concentration of SARS-CoV-2 viral copies this assay can detect is 250  copies / mL. A negative result does not preclude SARS-CoV-2 infection  and should not be used as the sole basis for treatment or other  patient management decisions.  A negative result may occur with  improper specimen collection / handling, submission of specimen other  than nasopharyngeal swab, presence of viral mutation(s) within the  areas targeted by this assay, and inadequate number of viral copies  (<250 copies / mL). A negative result must be combined with clinical  observations, patient history, and epidemiological information. If result is POSITIVE SARS-CoV-2 target nucleic acids are DETECTED. The SARS-CoV-2 RNA is generally detectable  in upper and lower  respiratory specimens dur ing the acute phase of infection.  Positive  results are indicative of active infection with SARS-CoV-2.  Clinical  correlation with patient history and other diagnostic information is  necessary to determine patient infection status.  Positive results do  not rule out bacterial infection or co-infection with other viruses. If result is PRESUMPTIVE POSTIVE SARS-CoV-2 nucleic acids MAY BE PRESENT.   A presumptive positive result was obtained on the submitted specimen  and confirmed on repeat testing.  While 2019 novel coronavirus  (SARS-CoV-2) nucleic acids may be present in the submitted sample  additional confirmatory testing may be necessary  for epidemiological  and / or clinical management purposes  to differentiate between  SARS-CoV-2 and other Sarbecovirus currently known to infect humans.  If clinically indicated additional testing with an alternate test  methodology [phone]) is advised. The SARS-CoV-2 RNA is generally  detectable in upper and lower respiratory sp ecimens during the acute  phase of infection. The expected result is Negative. Fact Sheet for Patients:  StrictlyIdeas.no Fact Sheet for Healthcare Providers: BankingDealers.co.za This test is not yet approved or cleared by the [place] FDA and has been authorized for detection and/or diagnosis of SARS-CoV-2 by FDA under an Emergency Use Authorization (EUA).  This EUA will remain in effect (meaning this test can be used) for the duration of the COVID-19 declaration under Section 564(b)(1) of the Act, 21 U.S.C. section 360bbb-3(b)(1), unless the authorization is terminated or revoked sooner. Performed at [facility], [place] [address] [place], [place] [address]   MRSA PCR Screening     Status: None   Collection Time: 09/22/18 10:18 PM   Specimen: Nasopharyngeal  Result Value Ref Range Status   MRSA by PCR  NEGATIVE NEGATIVE Final    Comment:        The GeneXpert MRSA Assay (FDA approved for NASAL specimens only), is one component of a comprehensive MRSA colonization surveillance program. It is not intended to diagnose MRSA infection nor to guide or monitor treatment for MRSA infections. Performed at [facility], [place] [address], [place], [place] [address]          Radiology Studies: Dg Chest 2 View  Result Date: 09/23/2018 CLINICAL DATA:  68 year old female with a history EXAM: CHEST - 2 VIEW COMPARISON:  09/22/2018 07/29/2018 FINDINGS: Cardiomediastinal silhouette enlarged. Fullness in the central vasculature. Interlobular septal thickening. Opacities at the bilateral lung bases with meniscus on the lateral view. No pneumothorax. IMPRESSION: CHF with small pleural effusions. Electronically Signed   By: Corrie Mckusick D.O.   On: 09/23/2018 08:04   Dg Chest Portable 1 View  Result Date: 09/22/2018 CLINICAL DATA:  Bilateral leg swelling. Left breast swelling. Shortness of breath. Dyspnea at rest. Congestive heart failure. EXAM: PORTABLE CHEST 1 VIEW COMPARISON:  One-view chest x-ray 07/29/2018 FINDINGS: The heart is enlarged. Interstitial edema is present. Bibasilar airspace disease likely reflects atelectasis. Small effusions are suspected. A right-sided PICC line is in place. This terminates at the cavoatrial junction. IMPRESSION: 1. Cardiomegaly with mild edema and bilateral effusions compatible with congestive heart failure. 2. Bibasilar airspace opacities likely reflect atelectasis. Electronically Signed   By: San Morelle M.D.   On: 09/22/2018 10:27        Scheduled Meds: . aspirin  324 mg Oral Once  . carvedilol  3.125 mg Oral BID WC  . furosemide  80 mg Intravenous BID  . heparin  5,000 Units Subcutaneous Q8H  . hydrALAZINE  25 mg Oral Q8H  . insulin aspart  0-20 Units Subcutaneous TID WC  . insulin aspart  0-5 Units Subcutaneous QHS  . insulin aspart  4 Units  Subcutaneous TID WC  . isosorbide mononitrate  30 mg Oral Daily  . potassium chloride  10 mEq Oral BID  . sodium chloride flush  3 mL Intravenous Q12H   Continuous Infusions: . sodium chloride       LOS: 2 days   The patient is critically ill with multiple organ systems failure and requires high complexity decision making for assessment and support, frequent evaluation and titration of therapies, application of advanced monitoring  technologies and extensive interpretation of multiple databases. Critical Care Time devoted to patient care services described in this note  Time spent: 40 minutes     Harue Pribble, Geraldo Docker, MD [facility] Pager [phone]  If 7PM-7AM, please contact night-coverage [email or web address] Password [email or web address] 09/24/2018, 8:00 AM

## 2018-09-24 NOTE — Progress Notes (Signed)
Physical Therapy Treatment Patient Details Name: Zoe Clarke MRN: [id number] DOB: 1950-12-30 Today's Date: 09/24/2018    History of Present Illness  Zoe Clarke is a 68 y.o. 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     PT Comments    Endurance remains an issue as pt props on her forearms when standing at sink for ADL tasks. She was able to walk farther today and appeared more steady. Continues to require assist getting in/out of bed and standing from an elevated bed (or from [facility] with armrests). Agree with current plan for SNF as it sounds like husband cannot provide necessary level of assist.    Follow Up Recommendations  SNF;Supervision/Assistance - 24 hour;Other (comment)(pt is not convinced she will go to SNF)     Equipment Recommendations  Other (comment)(she is already in process of getting a power chair)    Recommendations for Other Services       Precautions / Restrictions Precautions Precautions: Fall Restrictions Weight Bearing Restrictions: No    Mobility  Bed Mobility Overal bed mobility: Needs Assistance Bed Mobility: Supine to Sit;Sit to Supine     Supine to sit: Min assist;HOB elevated Sit to supine: Min assist   General bed mobility comments: Pt assisted up and to EOB with truncal assist; pt attempted to raise legs onto bed and required assist  Transfers Overall transfer level: Needs assistance Equipment used: Rolling walker (2 wheeled) Transfers: Sit to/from [facility] Sit to Stand: Min guard Stand pivot transfers: Min guard       General transfer comment: cues for hand placement and sequence with RW and  BSC  Ambulation/Gait Ambulation/Gait assistance: Min guard Gait Distance (Feet): 25 Feet(6 to sink, then 25 in room) Assistive device: Rolling walker (2 wheeled) Gait Pattern/deviations: Step-to pattern;Wide base of support;Decreased stride length     General Gait Details: No loss of balance throughout session. Tired from standing at sink to complete ADLs with OT prior to walking.    Stairs             Wheelchair Mobility    Modified Rankin (Stroke Patients Only)       Balance Overall balance assessment: Needs assistance Sitting-balance support: No upper extremity supported;Feet supported Sitting balance-Leahy Scale: Fair     Standing balance support: Bilateral upper extremity supported;During functional activity Standing balance-Leahy Scale: Poor Standing balance comment: at sink, props on forearms to complete bimanual tasks                            Cognition Arousal/Alertness: Awake/alert Behavior During Therapy: WFL for tasks assessed/performed Overall Cognitive Status: No family/caregiver present to determine baseline cognitive functioning                                 General Comments: internally distracted at times; able to redirect      Exercises      General Comments General comments (skin integrity, edema, etc.): Patient inquiring re: process for getting an electric wheelchair. She has already been working with her physician, [facility], and [facility] (to schedule w/c assessment)  Pertinent Vitals/Pain Pain Assessment: Faces Faces Pain Scale: Hurts little more Pain Location: rt lateral lower leg Pain Descriptors / Indicators: Aching;Discomfort;Grimacing Pain Intervention(s): Limited activity within patient's tolerance;Monitored during session;Repositioned    Home Living               Home Equipment: (P) Bedside commode;Walker - 2 wheels;Cane - single point;Walker - 4 wheels;Grab bars -  tub/shower;Shower seat      Prior Function Level of Independence: (P) Needs assistance  Gait / Transfers Assistance Needed: (P) uses rollator to walk to bathroom at home; supposed to be getting an electric w/c thru [employer] Assistance Needed: (P) assist with bra, dressing LB; bathes herself (her chair does not fit in her shower     PT Goals (current goals can now be found in the care plan section) Acute Rehab PT Goals Patient Stated Goal: get a power chair Time For Goal Achievement: 10/07/18 Progress towards PT goals: Progressing toward goals    Frequency    Min 3X/week      PT Plan Current plan remains appropriate    Co-evaluation PT/OT/SLP Co-Evaluation/Treatment: Yes Reason for Co-Treatment: For patient/therapist safety;To address functional/ADL transfers PT goals addressed during session: Mobility/safety with mobility;Balance;Proper use of DME        AM-PAC PT "6 Clicks" Mobility   Outcome Measure  Help needed turning from your back to your side while in a flat bed without using bedrails?: A Little Help needed moving from lying on your back to sitting on the side of a flat bed without using bedrails?: A Lot Help needed moving to and from a bed to a chair (including a wheelchair)?: A Little Help needed standing up from a chair using your arms (e.g., wheelchair or bedside chair)?: A Little Help needed to walk in hospital room?: A Little Help needed climbing 3-5 steps with a railing? : A Lot 6 Click Score: 16    End of Session   Activity Tolerance: Patient tolerated treatment well Patient left: with call bell/phone within reach;in bed Nurse Communication: Mobility status PT Visit Diagnosis: Unsteadiness on feet (R26.81);Muscle weakness (generalized) (M62.81);Pain Pain - Right/Left: Right Pain - part of body: Leg     Time: 5093-2671 PT Time Calculation (min) (ACUTE ONLY): 41 min  Charges:  $Gait Training: 8-22 mins                         [employer], PT 09/24/2018, 11:43 AM

## 2018-09-24 NOTE — Progress Notes (Signed)
DAILY PROGRESS NOTE   Patient Name: Zoe Clarke Date of Encounter: 09/24/2018 Cardiologist: Skeet Latch, MD  Chief Complaint   Breathing better today  Patient Profile   Zoe Clarke is a 68 y.o. female with a hx of chronic diastolic heart failure, hypertension, chronic kidney disease stage III, diabetes, asthma and GERD who is being seen today for the evaluation of CHF at the request of Dr. Evangeline Gula.  Subjective   Desats overnight- suspected OSA, recommend outpatient sleep study. Diuresed almost 3L negative. BP remains elevated, but improved. Creatinine up today to 2.33 (From 1.94).  Objective   Vitals:   09/24/18 0311 09/24/18 0536 09/24/18 0658 09/24/18 0848  BP: (!) 166/79  (!) 157/79 (!) 165/74  Pulse: 94   77  Resp: 19   17  Temp: 98.5 F (36.9 C)   98.4 F (36.9 C)  TempSrc:    Oral  SpO2: 100%   97%  Weight:  112.4 kg    Height:        Intake/Output Summary (Last 24 hours) at 09/24/2018 0858 Last data filed at 09/24/2018 0853 Gross per 24 hour  Intake 730 ml  Output 2675 ml  Net -1945 ml   Filed Weights   09/22/18 1945 09/24/18 0536  Weight: 116.5 kg 112.4 kg    Physical Exam   General appearance: alert and no distress Neck: JVD - 1 cm above sternal notch, no carotid bruit and thyroid not enlarged, symmetric, no tenderness/mass/nodules Lungs: diminished breath sounds bilaterally and rales bibasilar Heart: regular rate and rhythm Abdomen: soft, non-tender; bowel sounds normal; no masses,  no organomegaly and obese Extremities: extremities normal, atraumatic, no cyanosis or edema Pulses: 2+ and symmetric Skin: Skin color, texture, turgor normal. No rashes or lesions Neurologic: Grossly normal Psych: Pleasant  Inpatient Medications    Scheduled Meds:  aspirin  324 mg Oral Once   carvedilol  3.125 mg Oral BID WC   furosemide  80 mg Intravenous BID   heparin  5,000 Units Subcutaneous Q8H   hydrALAZINE  25 mg Oral Q8H   insulin  aspart  0-20 Units Subcutaneous TID WC   insulin aspart  0-5 Units Subcutaneous QHS   insulin aspart  4 Units Subcutaneous TID WC   isosorbide mononitrate  30 mg Oral Daily   potassium chloride  10 mEq Oral BID   sodium chloride flush  3 mL Intravenous Q12H    Continuous Infusions:  sodium chloride      PRN Meds: sodium chloride, acetaminophen, nitroGLYCERIN, ondansetron (ZOFRAN) IV, oxyCODONE-acetaminophen, sodium chloride flush, zolpidem   Labs   Results for orders placed or performed during the hospital encounter of 09/22/18 (from the past 48 hour(s))  Comprehensive metabolic panel     Status: Abnormal   Collection Time: 09/22/18 10:14 AM  Result Value Ref Range   Sodium 143 135 - 145 mmol/L   Potassium 4.1 3.5 - 5.1 mmol/L   Chloride 106 98 - 111 mmol/L   CO2 27 22 - 32 mmol/L   Glucose, Bld 196 (H) 70 - 99 mg/dL   BUN 15 8 - 23 mg/dL   Creatinine, Ser 1.99 (H) 0.44 - 1.00 mg/dL   Calcium 8.6 (L) 8.9 - 10.3 mg/dL   Total Protein 6.8 6.5 - 8.1 g/dL   Albumin 2.9 (L) 3.5 - 5.0 g/dL   AST 12 (L) 15 - 41 U/L   ALT 15 0 - 44 U/L   Alkaline Phosphatase 106 38 - 126 U/L  Total Bilirubin 0.7 0.3 - 1.2 mg/dL   GFR calc non Af Amer 26 (L) >60 mL/min   GFR calc Af Amer 30 (L) >60 mL/min   Anion gap 10 5 - 15    Comment: Performed at [facility] [address] [place], [place] [address]  Brain natriuretic peptide     Status: Abnormal   Collection Time: 09/22/18 10:14 AM  Result Value Ref Range   B Natriuretic Peptide 1,721.0 (H) 0.0 - 100.0 pg/mL    Comment: Performed at [facility] [address], [place], [place] [address]  Troponin I (High Sensitivity)     Status: Abnormal   Collection Time: 09/22/18 10:14 AM  Result Value Ref Range   Troponin I (High Sensitivity) 27 (H) <18 ng/L    Comment: (NOTE) Elevated high sensitivity troponin I (hsTnI) values and significant  changes across serial measurements may suggest ACS but many other  chronic and  acute conditions are known to elevate hsTnI results.  Refer to the "Links" section for chest pain algorithms and additional  guidance. Performed at [facility], [place] [address], [place], [place] [address]   CBC with Differential     Status: Abnormal   Collection Time: 09/22/18 10:14 AM  Result Value Ref Range   WBC 8.6 4.0 - 10.5 K/uL   RBC 3.49 (L) 3.87 - 5.11 MIL/uL   Hemoglobin 9.3 (L) 12.0 - 15.0 g/dL   HCT 30.2 (L) 36.0 - 46.0 %   MCV 86.5 80.0 - 100.0 fL   MCH 26.6 26.0 - 34.0 pg   MCHC 30.8 30.0 - 36.0 g/dL   RDW 15.0 11.5 - 15.5 %   Platelets 241 150 - 400 K/uL   nRBC 0.0 0.0 - 0.2 %   Neutrophils Relative % 71 %   Neutro Abs 6.1 1.7 - 7.7 K/uL   Lymphocytes Relative 20 %   Lymphs Abs 1.7 0.7 - 4.0 K/uL   Monocytes Relative 6 %   Monocytes Absolute 0.5 0.1 - 1.0 K/uL   Eosinophils Relative 2 %   Eosinophils Absolute 0.1 0.0 - 0.5 K/uL   Basophils Relative 0 %   Basophils Absolute 0.0 0.0 - 0.1 K/uL   Immature Granulocytes 1 %   Abs Immature Granulocytes 0.04 0.00 - 0.07 K/uL    Comment: Performed at [facility] [address] [place], [place] [address]  SARS Coronavirus 2 (CEPHEID - Performed in [facility] hospital lab), Hosp Order     Status: None   Collection Time: 09/22/18 10:19 AM   Specimen: Nasopharyngeal Swab  Result Value Ref Range   SARS Coronavirus 2 NEGATIVE NEGATIVE    Comment: (NOTE) If result is NEGATIVE SARS-CoV-2 target nucleic acids are NOT DETECTED. The SARS-CoV-2 RNA is generally detectable in upper and lower  respiratory specimens during the acute phase of infection. The lowest  concentration of SARS-CoV-2 viral copies this assay can detect is 250  copies / mL. A negative result does not preclude SARS-CoV-2 infection  and should not be used as the sole basis for treatment or other  patient management decisions.  A negative result may occur with  improper specimen collection / handling, submission of specimen other  than  nasopharyngeal swab, presence of viral mutation(s) within the  areas targeted by this assay, and inadequate number of viral copies  (<250 copies / mL). A negative result must be combined with clinical  observations, patient history, and epidemiological information. If result  is POSITIVE SARS-CoV-2 target nucleic acids are DETECTED. The SARS-CoV-2 RNA is generally detectable in upper and lower  respiratory specimens dur ing the acute phase of infection.  Positive  results are indicative of active infection with SARS-CoV-2.  Clinical  correlation with patient history and other diagnostic information is  necessary to determine patient infection status.  Positive results do  not rule out bacterial infection or co-infection with other viruses. If result is PRESUMPTIVE POSTIVE SARS-CoV-2 nucleic acids MAY BE PRESENT.   A presumptive positive result was obtained on the submitted specimen  and confirmed on repeat testing.  While 2019 novel coronavirus  (SARS-CoV-2) nucleic acids may be present in the submitted sample  additional confirmatory testing may be necessary for epidemiological  and / or clinical management purposes  to differentiate between  SARS-CoV-2 and other Sarbecovirus currently known to infect humans.  If clinically indicated additional testing with an alternate test  methodology [phone]) is advised. The SARS-CoV-2 RNA is generally  detectable in upper and lower respiratory sp ecimens during the acute  phase of infection. The expected result is Negative. Fact Sheet for Patients:  StrictlyIdeas.no Fact Sheet for Healthcare Providers: BankingDealers.co.za This test is not yet approved or cleared by the [place] FDA and has been authorized for detection and/or diagnosis of SARS-CoV-2 by FDA under an Emergency Use Authorization (EUA).  This EUA will remain in effect (meaning this test can be used) for the duration of  the COVID-19 declaration under Section 564(b)(1) of the Act, 21 U.S.C. section 360bbb-3(b)(1), unless the authorization is terminated or revoked sooner. Performed at [facility], [facility] [address], [place], [place] [address]   Troponin I (High Sensitivity)     Status: Abnormal   Collection Time: 09/22/18 12:24 PM  Result Value Ref Range   Troponin I (High Sensitivity) 28 (H) <18 ng/L    Comment: (NOTE) Elevated high sensitivity troponin I (hsTnI) values and significant  changes across serial measurements may suggest ACS but many other  chronic and acute conditions are known to elevate hsTnI results.  Refer to the "Links" section for chest pain algorithms and additional  guidance. Performed at [facility], [place] [address] [place], [place] [address]   Troponin I (High Sensitivity)     Status: Abnormal   Collection Time: 09/22/18  4:25 PM  Result Value Ref Range   Troponin I (High Sensitivity) 28 (H) <18 ng/L    Comment: (NOTE) Elevated high sensitivity troponin I (hsTnI) values and significant  changes across serial measurements may suggest ACS but many other  chronic and acute conditions are known to elevate hsTnI results.  Refer to the "Links" section for chest pain algorithms and additional  guidance. Performed at [facility], [place] [address] [place], [place] [address]   CBG monitoring, ED     Status: Abnormal   Collection Time: 09/22/18  5:36 PM  Result Value Ref Range   Glucose-Capillary 187 (H) 70 - 99 mg/dL  Glucose, capillary     Status: None   Collection Time: 09/22/18  9:12 PM  Result Value Ref Range   Glucose-Capillary 86 70 - 99 mg/dL   Comment 1 Notify RN    Comment 2 Document in Chart   MRSA PCR Screening     Status: None   Collection Time: 09/22/18 10:18 PM   Specimen: Nasopharyngeal  Result Value Ref Range   MRSA by PCR NEGATIVE NEGATIVE    Comment:  The GeneXpert MRSA Assay (FDA approved for NASAL specimens only), is one  component of a comprehensive MRSA colonization surveillance program. It is not intended to diagnose MRSA infection nor to guide or monitor treatment for MRSA infections. Performed at [facility], [place] [address] [place], [place] [address]   Basic metabolic panel     Status: Abnormal   Collection Time: 09/23/18  1:54 AM  Result Value Ref Range   Sodium 140 135 - 145 mmol/L   Potassium 3.8 3.5 - 5.1 mmol/L   Chloride 104 98 - 111 mmol/L   CO2 26 22 - 32 mmol/L   Glucose, Bld 129 (H) 70 - 99 mg/dL   BUN 15 8 - 23 mg/dL   Creatinine, Ser 1.94 (H) 0.44 - 1.00 mg/dL   Calcium 8.5 (L) 8.9 - 10.3 mg/dL   GFR calc non Af Amer 26 (L) >60 mL/min   GFR calc Af Amer 31 (L) >60 mL/min   Anion gap 10 5 - 15    Comment: Performed at [facility] [address], [place], [place] [address]  Magnesium     Status: None   Collection Time: 09/23/18  1:54 AM  Result Value Ref Range   Magnesium 1.8 1.7 - 2.4 mg/dL    Comment: Performed at [facility] [address] [place], [place] [address]  Brain natriuretic peptide     Status: Abnormal   Collection Time: 09/23/18  2:06 AM  Result Value Ref Range   B Natriuretic Peptide 1,496.0 (H) 0.0 - 100.0 pg/mL    Comment: Performed at [facility] [address], [place], [place] [address]  Glucose, capillary     Status: Abnormal   Collection Time: 09/23/18  6:45 AM  Result Value Ref Range   Glucose-Capillary 142 (H) 70 - 99 mg/dL   Comment 1 Notify RN    Comment 2 Document in Chart   Glucose, capillary     Status: Abnormal   Collection Time: 09/23/18 10:52 AM  Result Value Ref Range   Glucose-Capillary 123 (H) 70 - 99 mg/dL  Glucose, capillary     Status: None   Collection Time: 09/23/18  4:24 PM  Result Value Ref Range   Glucose-Capillary 83 70 - 99 mg/dL   Comment 1 Notify RN    Comment 2 Document in Chart   Glucose, capillary     Status: Abnormal   Collection Time: 09/23/18  9:43 PM  Result Value Ref Range    Glucose-Capillary 116 (H) 70 - 99 mg/dL  Basic metabolic panel     Status: Abnormal   Collection Time: 09/24/18  3:22 AM  Result Value Ref Range   Sodium 138 135 - 145 mmol/L   Potassium 4.1 3.5 - 5.1 mmol/L   Chloride 101 98 - 111 mmol/L   CO2 26 22 - 32 mmol/L   Glucose, Bld 95 70 - 99 mg/dL   BUN 19 8 - 23 mg/dL   Creatinine, Ser 2.23 (H) 0.44 - 1.00 mg/dL   Calcium 8.7 (L) 8.9 - 10.3 mg/dL   GFR calc non Af Amer 22 (L) >60 mL/min   GFR calc Af Amer 26 (L) >60 mL/min   Anion gap 11 5 - 15    Comment: Performed at [facility], [place] [address], [place], [place] [address]  Glucose, capillary     Status: Abnormal   Collection Time: 09/24/18  5:52 AM  Result Value Ref Range  Glucose-Capillary 107 (H) 70 - 99 mg/dL   Comment 1 Notify RN    Comment 2 Document in Chart     ECG   N/A  Telemetry   Sinus rhythm - Personally Reviewed  Radiology    Dg Chest 2 View  Result Date: 09/23/2018 CLINICAL DATA:  68 year old female with a history EXAM: CHEST - 2 VIEW COMPARISON:  09/22/2018 07/29/2018 FINDINGS: Cardiomediastinal silhouette enlarged. Fullness in the central vasculature. Interlobular septal thickening. Opacities at the bilateral lung bases with meniscus on the lateral view. No pneumothorax. IMPRESSION: CHF with small pleural effusions. Electronically Signed   By: Corrie Mckusick D.O.   On: 09/23/2018 08:04   Dg Chest Portable 1 View  Result Date: 09/22/2018 CLINICAL DATA:  Bilateral leg swelling. Left breast swelling. Shortness of breath. Dyspnea at rest. Congestive heart failure. EXAM: PORTABLE CHEST 1 VIEW COMPARISON:  One-view chest x-ray 07/29/2018 FINDINGS: The heart is enlarged. Interstitial edema is present. Bibasilar airspace disease likely reflects atelectasis. Small effusions are suspected. A right-sided PICC line is in place. This terminates at the cavoatrial junction. IMPRESSION: 1. Cardiomegaly with mild edema and bilateral effusions compatible with congestive  heart failure. 2. Bibasilar airspace opacities likely reflect atelectasis. Electronically Signed   By: San Morelle M.D.   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 CHF improved - weight now at 112 kg (from 116 kg) - creatinine increased overnight. Will hold IV lasix today - switch to oral lasix 80 mg daily tomorrow. Increase hydralazine to 50 mg q8hrs. Mobilize.  Time Spent Directly with Patient:   I have spent a total of 25 minutes with the patient reviewing hospital notes, telemetry, EKGs, labs and examining the patient as well as establishing an assessment and plan that was discussed personally with the patient.  > 50% of time was spent in direct patient care.  Length of Stay:  LOS: 2 days   Zoe Casino, MD, [facility], [place] Director of the Advanced Lipid Disorders &  Cardiovascular Risk Reduction Clinic Diplomate of the American Board of Clinical Lipidology Attending Cardiologist  Direct Dial: [phone]   Fax: [phone]  Website:  [email or web address] Tamieka Rancourt 09/24/2018, 8:58 AM

## 2018-09-24 NOTE — Evaluation (Signed)
Occupational Therapy Evaluation Patient Details Name: Zoe Clarke MRN: [id number] DOB: 15-Oct-1950 Today's Date: 09/24/2018    History of Present Illness  Zoe Clarke is a 67 y.o. 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    Clinical Impression   PTA patient reports independent with using RW for mobility, some assist with ADLs from spouse (donning bra, LB clothing, but able to toilet and bathe herself), limited IADLs. Pt admitted for above and limited by problem list below, including impaired balance, decreased activity tolerance, generalized weakness.  Cognitively, pt oriented and able to follow commands but distracted at times requiring redirection. Noted pt became labile when discussing rehab recommendations, eager to dc home.  Patient currently requires min assist for bed mobility, min guard for transfers +2 safety, grooming standing at sink with min guard, min assist for toileting, and max assist for LB ADLs (assume baseline). VSS during session, on RA.  She will benefit from continued OT services while admitted and after dc at short term SNF rehab at this time (although may progress to [facility] services) in order to maximize independence and safety with ADLs/mobility prior to dc home.     Follow Up Recommendations  SNF;Supervision/Assistance - 24 hour(may progress to [facility])    Equipment Recommendations  None recommended by OT    Recommendations for Other Services       Precautions / Restrictions Precautions Precautions: Fall Restrictions Weight Bearing Restrictions: No      Mobility Bed Mobility Overal bed mobility: Needs Assistance Bed Mobility: Supine to Sit;Sit to Supine      Supine to sit: Min assist;HOB elevated Sit to supine: Min assist   General bed mobility comments: Pt assisted up and to EOB with truncal assist; pt attempted to raise legs onto bed and required assist  Transfers Overall transfer level: Needs assistance Equipment used: Rolling walker (2 wheeled) Transfers: Sit to/from [facility] Sit to Stand: Min guard Stand pivot transfers: Min guard       General transfer comment: min guard for safety/balance, cueing for hand placement, technique and safety using RW     Balance Overall balance assessment: Needs assistance Sitting-balance support: No upper extremity supported;Feet supported Sitting balance-Leahy Scale: Fair     Standing balance support: Bilateral upper extremity supported;During functional activity Standing balance-Leahy Scale: Poor Standing balance comment: reliant on B UE support, proping herself on her forearms during bimanual tasks                            ADL either performed or assessed with clinical judgement   ADL Overall ADL's : Needs assistance/impaired     Grooming: Min guard;Standing Grooming Details (indicate cue type and reason): increased time required, min guard for safety while completing oral care, washing face Upper Body Bathing: Set up;Sitting   Lower Body Bathing: Moderate assistance;Sit to/from stand Lower Body Bathing Details (indicate cue type and reason): decreased reach to B Feet, min guard sit<>stand  Upper Body Dressing : Set up;Sitting   Lower Body Dressing: Maximal assistance;Sit to/from stand;+2 for safety/equipment Lower Body Dressing Details (indicate cue type and reason): min guard sit<>stand, total assist for socks and relaint on UE support in  standing  Toilet Transfer: Min guard;BSC;Stand-pivot;+2 for safety/equipment;RW   Toileting- Water quality scientist and Hygiene: Sit to/from stand;Minimal assistance       Functional mobility during ADLs: Min  guard;Rolling walker;Cueing for safety General ADL Comments: pt limited by decreased activity tolerance, impaired balance, and generalized weakness      Vision         Perception     Praxis      Pertinent Vitals/Pain Pain Assessment: Faces Faces Pain Scale: Hurts little more Pain Location: rt lateral lower leg Pain Descriptors / Indicators: Aching;Discomfort;Grimacing Pain Intervention(s): Monitored during session;Repositioned     Hand Dominance Left   Extremity/Trunk Assessment Upper Extremity Assessment Upper Extremity Assessment: Generalized weakness   Lower Extremity Assessment Lower Extremity Assessment: Defer to PT evaluation       Communication Communication Communication: No difficulties   Cognition Arousal/Alertness: Awake/alert Behavior During Therapy: WFL for tasks assessed/performed Overall Cognitive Status: No family/caregiver present to determine baseline cognitive functioning                                 General Comments: internally distracted at times; able to redirect; able to follow commands with increased time    General Comments  VSS, SpO2 93-95 on RA     Exercises     Shoulder Instructions      Home Living Family/patient expects to be discharged to:: Private residence Living Arrangements: Spouse/significant other Available Help at Discharge: Family;Available 24 hours/day Type of Home: House Home Access: Stairs to enter [employer] of Steps: 3 Entrance Stairs-Rails: Left Home Layout: One level     Bathroom Shower/Tub: Teacher, early years/pre: Standard     Home Equipment: Bedside commode;Walker - 2 wheels;Cane - single point;Walker - 4 wheels;Grab bars - tub/shower;Shower seat          Prior Functioning/Environment Level of Independence: Needs assistance  Gait / Transfers Assistance Needed: uses rollator to walk to bathroom at home; supposed to be getting an electric w/c thru [employer] / Hom[employer] Assistance Needed: assist with bra, dressing LB; bathes herself in shower; independent toileting/ toilet transfers Communication / Swallowing Assistance Needed: no difficulties          OT Problem List: Decreased strength;Decreased activity tolerance;Impaired balance (sitting and/or standing);Decreased cognition;Decreased safety awareness;Decreased knowledge of use of DME or AE;Decreased knowledge of precautions;Pain;Increased edema      OT Treatment/Interventions: Self-care/ADL training;Energy conservation;DME and/or AE instruction;Therapeutic exercise;Therapeutic activities;Patient/family education;Balance training    OT Goals(Current goals can be found in the care plan section) Acute Rehab OT Goals Patient Stated Goal: to get home  OT Goal Formulation: With patient Time For Goal Achievement: 10/08/18 Potential to Achieve Goals: Good  OT Frequency: Min 2X/week   Barriers to D/C:            Co-evaluation PT/OT/SLP Co-Evaluation/Treatment: Yes Reason for Co-Treatment: For patient/therapist safety;To address functional/ADL transfers PT goals addressed during session: Mobility/safety with mobility;Balance;Proper use of DME OT goals addressed during session: ADL's and self-care      AM-PAC OT "6 Clicks" Daily Activity     Outcome Measure Help from another person eating meals?: None Help from another person taking care of personal grooming?: A Little Help from another person toileting, which includes using toliet, bedpan, or urinal?: A Little Help from another person bathing (including washing, rinsing, drying)?: A Lot Help from another person to put on and taking off regular upper  body clothing?: None Help from another person to put on and taking off regular lower body clothing?: A Lot 6 Click Score: 18   End of Session Equipment Utilized During Treatment: Rolling walker Nurse Communication: Mobility status;Other (comment)(encouarged use of 3:1 commode  for toileting needs )  Activity Tolerance: Patient tolerated treatment well Patient left: in bed;with call bell/phone within reach;with bed alarm set  OT Visit Diagnosis: Unsteadiness on feet (R26.81);Pain;Muscle weakness (generalized) (M62.81) Pain - Right/Left: Right Pain - part of body: Leg                Time: 3754-2370 OT Time Calculation (min): 41 min Charges:  OT General Charges $OT Visit: 1 Visit OT Evaluation $OT Eval Moderate Complexity: 1 Mod OT Treatments $Self Care/Home Management : 8-22 mins  Delight Stare, OT Acute Rehabilitation Services Pager [phone] Office [phone]   Delight Stare 09/24/2018, 12:20 PM

## 2018-09-25 DIAGNOSIS — G4739 Other sleep apnea: Secondary | ICD-10-CM

## 2018-09-25 DIAGNOSIS — I5033 Acute on chronic diastolic (congestive) heart failure: Secondary | ICD-10-CM

## 2018-09-25 LAB — CBC
HCT: 28.1 % — ABNORMAL LOW (ref 36.0–46.0)
Hemoglobin: 9 g/dL — ABNORMAL LOW (ref 12.0–15.0)
MCH: 26.9 pg (ref 26.0–34.0)
MCHC: 32 g/dL (ref 30.0–36.0)
MCV: 83.9 fL (ref 80.0–100.0)
Platelets: 232 10*3/uL (ref 150–400)
RBC: 3.35 MIL/uL — ABNORMAL LOW (ref 3.87–5.11)
RDW: 14.6 % (ref 11.5–15.5)
WBC: 8.8 10*3/uL (ref 4.0–10.5)
nRBC: 0 % (ref 0.0–0.2)

## 2018-09-25 LAB — GLUCOSE, CAPILLARY
Glucose-Capillary: 117 mg/dL — ABNORMAL HIGH (ref 70–99)
Glucose-Capillary: 124 mg/dL — ABNORMAL HIGH (ref 70–99)
Glucose-Capillary: 141 mg/dL — ABNORMAL HIGH (ref 70–99)
Glucose-Capillary: 149 mg/dL — ABNORMAL HIGH (ref 70–99)
Glucose-Capillary: 129 mg/dL — ABNORMAL HIGH (ref 70–99)
Glucose-Capillary: 166 mg/dL — ABNORMAL HIGH (ref 70–99)
Glucose-Capillary: 133 mg/dL — ABNORMAL HIGH (ref 70–99)

## 2018-09-25 LAB — MAGNESIUM: Magnesium: 1.8 mg/dL (ref 1.7–2.4)

## 2018-09-25 LAB — BASIC METABOLIC PANEL
Anion gap: 12 (ref 5–15)
BUN: 20 mg/dL (ref 8–23)
CO2: 26 mmol/L (ref 22–32)
Calcium: 8.5 mg/dL — ABNORMAL LOW (ref 8.9–10.3)
Chloride: 100 mmol/L (ref 98–111)
Creatinine, Ser: 2.24 mg/dL — ABNORMAL HIGH (ref 0.44–1.00)
GFR calc Af Amer: 26 mL/min — ABNORMAL LOW (ref >60)
GFR calc non Af Amer: 22 mL/min — ABNORMAL LOW (ref >60)
Glucose, Bld: 116 mg/dL — ABNORMAL HIGH (ref 70–99)
Potassium: 4.2 mmol/L (ref 3.5–5.1)
Sodium: 138 mmol/L (ref 135–145)

## 2018-09-25 LAB — RETICULOCYTES
Immature Retic Fract: 24.7 % — ABNORMAL HIGH (ref 2.3–15.9)
RBC.: 3.35 MIL/uL — ABNORMAL LOW (ref 3.87–5.11)
Retic Count, Absolute: 68.7 10*3/uL (ref 19.0–186.0)
Retic Ct Pct: 2.1 % (ref 0.4–3.1)

## 2018-09-25 LAB — FOLATE: Folate: 9.4 ng/mL (ref >5.9)

## 2018-09-25 LAB — IRON AND TIBC
Iron: 38 ug/dL (ref 28–170)
Saturation Ratios: 16 % (ref 10.4–31.8)
TIBC: 234 ug/dL — ABNORMAL LOW (ref 250–450)
UIBC: 196 ug/dL

## 2018-09-25 LAB — LIPID PANEL
Cholesterol: 124 mg/dL (ref 0–200)
HDL: 46 mg/dL (ref >40)
LDL Cholesterol: 55 mg/dL (ref 0–99)
Total CHOL/HDL Ratio: 2.7 RATIO
Triglycerides: 115 mg/dL (ref <150)
VLDL: 23 mg/dL (ref 0–40)

## 2018-09-25 LAB — VITAMIN B12: Vitamin B-12: 305 pg/mL (ref 180–914)

## 2018-09-25 LAB — FERRITIN: Ferritin: 156 ng/mL (ref 11–307)

## 2018-09-25 MED ORDER — ALBUTEROL SULFATE (2.5 MG/3ML) 0.083% IN NEBU: 2.5000 mg | INHALATION_SOLUTION | RESPIRATORY_TRACT | Status: DC | PRN

## 2018-09-25 MED ORDER — ISOSORBIDE MONONITRATE ER 60 MG PO TB24
60.0000 mg | ORAL_TABLET | Freq: Every day | ORAL | Status: DC
  Administered 2018-09-25 – 2018-09-30 (×6): 60 mg via ORAL
  Filled 2018-09-25 (×6): qty 1

## 2018-09-25 MED ORDER — INSULIN ASPART 100 UNIT/ML ~~LOC~~ SOLN
0.0000 [IU] | SUBCUTANEOUS | Status: DC
  Administered 2018-09-25 (×3): 1 [IU] via SUBCUTANEOUS
  Administered 2018-09-25: 2 [IU] via SUBCUTANEOUS
  Administered 2018-09-26: 3 [IU] via SUBCUTANEOUS
  Administered 2018-09-26 – 2018-09-27 (×3): 1 [IU] via SUBCUTANEOUS
  Administered 2018-09-27 – 2018-09-28 (×2): 2 [IU] via SUBCUTANEOUS
  Administered 2018-09-29 (×2): 1 [IU] via SUBCUTANEOUS
  Administered 2018-09-29: 5 [IU] via SUBCUTANEOUS
  Administered 2018-09-29: 2 [IU] via SUBCUTANEOUS
  Administered 2018-09-30 (×2): 1 [IU] via SUBCUTANEOUS
  Administered 2018-09-30: 09:00:00 3 [IU] via SUBCUTANEOUS

## 2018-09-25 MED ORDER — ATORVASTATIN CALCIUM 10 MG PO TABS
20.0000 mg | ORAL_TABLET | Freq: Every day | ORAL | Status: DC
  Administered 2018-09-25 – 2018-09-29 (×5): 20 mg via ORAL
  Filled 2018-09-25 (×5): qty 2

## 2018-09-25 MED ORDER — CARVEDILOL 6.25 MG PO TABS
6.2500 mg | ORAL_TABLET | Freq: Two times a day (BID) | ORAL | Status: DC
  Administered 2018-09-25 – 2018-09-27 (×5): 6.25 mg via ORAL
  Filled 2018-09-25 (×5): qty 1

## 2018-09-25 NOTE — Progress Notes (Signed)
PROGRESS NOTE    Zoe Clarke  PRF:[id number] DOB: [birth year] DOA: 09/22/2018 PCP: Antony Contras, MD   Brief Narrative:  68 y.o. 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  Furthermore she has problems with swelling of the left breast.  Review of medical record reveals recent contacts with her primary care doctor's office regarding swelling of her lower extremities.  Patient was advised to increase her Lasix and continue her medications and discussions were had with her regarding her fluid intake.  He reported drinking half a gallon of fluid a day usually but has been drinking more due to the heat.  Patient felt that it was her Lasix causing her to swell so she stopped her Lasix.  She also stopped her clonidine.  She has been feeling short of breath for the last few days.  She is has peripheral edema in her lower extremities and her left breast.  Last took her Lasix last week.  This morning she did not take any of her medications.  She has been having some cough with white phlegm production and wheezing.  And while she is a smoker she has never had an exacerbation of COPD.  She is not hypoxic in the emergency department.  Her blood pressure is shockingly elevated at 213/95.     Subjective: 7/9 A/O x4, negative CP, negative S OB.  Has not ambulated further than the chair in the sink.  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 Skeet Latch acute on chronic diastolic CHF   Essential HTN -See CHF   Diabetes type 2 uncontrolled with complication - 7/1 hemoglobin A1c= 8.1 - 7/8 increase NovoLog 8 units QAC - 7.9 decrease to sensitive SSI   - NovoLog SSI nightly -Schedule follow-up appointment with Dr. Antony Contras in 2 to 3 weeks diabetes type 2 uncontrolled with complication, CHF.   HLD - Although patient's LDL within AHA/ADA guidelines will start statin given her comorbidities - 7/9 Lipitor 20 mg daily  CKD stage III (baseline Cr ~2) -Monitor kidney function - Hold all nephrotoxic medication Recent Labs  Lab 09/22/18 1014 09/23/18 0154 09/24/18 0322 09/25/18 0257  CREATININE 1.99* 1.94* 2.23* 2.24*  -  Creatinine holding stable with current diuretic change.    Normocytic anemia - Possibly secondary to CKD - Anemia panel consistent with normocytic anemia   Thyroid nodule - Ultrasound thyroid at the end of June showed a nodule that requires FNA - Follow-up with ENT as an outpatient  through the referral of PCP, after patient completes SNF  Tobacco abuse - Counseled at length concerning absolute need to abstain from smoking given her multiple medical problems, patient unlikely to follow recommendations.   OSA? - Per patient  was scheduled for an outpatient sleep study, will need to reschedule following completion of stay at SNF.    Goals of care -7/8 consult to social work; patient requires SNF upon discharge secondary to poor conditioning and acute on chronic diastolic CHF, however patient now refuses SNF   DVT prophylaxis: Heparin Code Status: Partial Family Communication: None Disposition Plan: Home   Consultants:  Cardiology    Procedures/Significant Events:     I have personally reviewed and interpreted all radiology studies and my findings are as above.  VENTILATOR SETTINGS:    Cultures   Antimicrobials: Anti-infectives (From admission, onward)   None       Devices    LINES / TUBES:      Continuous Infusions:  sodium chloride       Objective: Vitals:   09/24/18 2022 09/25/18 0305 09/25/18 0500 09/25/18 0740  BP: (!) 158/109 (!) 195/76  (!) 183/87  Pulse: 78 63  68  Resp: 14 12  19   Temp: 98.3 F (36.8 C) 98.2 F (36.8 C)  98.5 F (36.9 C)  TempSrc: Oral Oral  Oral  SpO2: 98% 91%  93%  Weight:   112 kg   Height:        Intake/Output Summary (Last 24 hours) at 09/25/2018 0805 Last data filed at 09/25/2018 0242 Gross per 24 hour  Intake 193 ml  Output 300 ml  Net -107 ml   Filed Weights   09/22/18 1945 09/24/18 0536 09/25/18 0500  Weight: 116.5 kg 112.4 kg 112 kg   Physical Exam:  General: A/O x4, no acute respiratory distress Eyes: negative scleral hemorrhage, negative anisocoria, negative icterus ENT: Negative Runny nose, negative gingival bleeding, Neck:  Negative scars, masses, torticollis, lymphadenopathy, JVD Lungs: Clear to auscultation bilaterally without wheezes or crackles Cardiovascular: Regular rate and rhythm without murmur gallop or rub normal S1 and S2 Abdomen: Morbidly obese, negative abdominal pain, nondistended, positive soft, bowel sounds, no rebound, no ascites, no appreciable mass Extremities: No significant cyanosis, clubbing, or  edema bilateral lower extremities Skin: Negative rashes, lesions, ulcers Psychiatric:  Negative depression, negative anxiety, negative fatigue, negative mania, poor understanding of her disease processes Central nervous system:  Cranial nerves II through XII intact, tongue/uvula midline, all extremities muscle strength 5/5, sensation intact throughout,  negative dysarthria, negative expressive aphasia, negative receptive aphasia.       Data Reviewed: Care during the described time interval was provided by me .  I have reviewed this patient's available data, including medical history, events of note, physical examination, and all test results as part of my evaluation.   CBC: Recent Labs  Lab 09/22/18 1014 09/25/18 0257  WBC 8.6 8.8  NEUTROABS 6.1  --   HGB 9.3* 9.0*  HCT 30.2* 28.1*  MCV 86.5 83.9  PLT 241 509   Basic Metabolic Panel: Recent Labs  Lab 09/22/18 1014 09/23/18 0154 09/24/18 0322 09/25/18 0257  NA 143 140 138 138  K 4.1 3.8 4.1 4.2  CL 106 104 101 100  CO2 27 26 26 26   GLUCOSE 196* 129* 95 116*  BUN 15 15 19 20   CREATININE 1.99* 1.94* 2.23* 2.24*  CALCIUM 8.6* 8.5* 8.7* 8.5*  MG  --  1.8  --  1.8   GFR: Estimated Creatinine Clearance: 32.4 mL/min (A) (by C-G formula based on SCr of 2.24 mg/dL (H)). Liver Function Tests: Recent Labs  Lab 09/22/18 1014  AST 12*  ALT 15  ALKPHOS 106  BILITOT 0.7  PROT 6.8  ALBUMIN 2.9*   No results for input(s): LIPASE, AMYLASE in the last 168 hours. No results for input(s): AMMONIA in the last 168 hours. Coagulation Profile: No results for input(s): INR, PROTIME in the last 168 hours. Cardiac Enzymes: No results for input(s): CKTOTAL, CKMB, CKMBINDEX, TROPONINI in the last 168 hours. BNP (last 3 results) No results for input(s): PROBNP in the last 8760 hours. HbA1C: No results for input(s): HGBA1C in the last 72 hours. CBG: Recent Labs  Lab 09/24/18 1119 09/24/18 1610 09/24/18 2017 09/25/18 0057  09/25/18 0346  GLUCAP 108* 109* 99 117* 124*   Lipid Profile: Recent Labs    09/25/18 0257  CHOL 124  HDL 46  LDLCALC 55  TRIG 115  CHOLHDL 2.7   Thyroid Function Tests: No results for input(s): TSH, T4TOTAL, FREET4, T3FREE, THYROIDAB in the last 72 hours. Anemia Panel: Recent Labs    09/25/18 0257  VITAMINB12 305  FOLATE 9.4  FERRITIN 156  TIBC 234*  IRON 38  RETICCTPCT 2.1   Urine analysis:    Component Value Date/Time   COLORURINE YELLOW 04/27/2018 1334   APPEARANCEUR CLEAR 04/27/2018 1334   LABSPEC 1.012 04/27/2018 1334   PHURINE 7.0 04/27/2018 1334   GLUCOSEU 50 (A) 04/27/2018 1334   HGBUR SMALL (A) 04/27/2018 1334   BILIRUBINUR NEGATIVE 04/27/2018 1334   BILIRUBINUR negative 02/25/2015 1426   KETONESUR NEGATIVE 04/27/2018 1334   PROTEINUR 100 (A) 04/27/2018 1334   UROBILINOGEN 0.2 02/25/2015 1426   UROBILINOGEN 1.0 04/16/2009 0943   NITRITE NEGATIVE 04/27/2018 1334   LEUKOCYTESUR NEGATIVE 04/27/2018 1334   Sepsis Labs: @LABRCNTIP (procalcitonin:4,lacticidven:4)  ) Recent Results (from the past 240 hour(s))  SARS Coronavirus 2 (CEPHEID - Performed in [place] hospital lab), Hosp Order     Status: None   Collection Time: 09/22/18 10:19 AM   Specimen: Nasopharyngeal Swab  Result Value Ref Range Status   SARS Coronavirus 2 NEGATIVE NEGATIVE Final    Comment: (NOTE) If result is NEGATIVE SARS-CoV-2 target nucleic acids are NOT DETECTED. The SARS-CoV-2 RNA is generally detectable in upper and lower  respiratory specimens during the acute phase of infection. The lowest  concentration of SARS-CoV-2 viral copies this assay can detect is 250  copies / mL. A negative result does not preclude SARS-CoV-2 infection  and should not be used as the sole basis for treatment or other  patient management decisions.  A negative result may occur with  improper specimen collection / handling, submission of specimen other  than nasopharyngeal swab, presence of viral  mutation(s) within the  areas targeted by this assay, and inadequate number of viral copies  (<250 copies / mL). A negative result must be combined with clinical  observations, patient history, and epidemiological information. If result is POSITIVE SARS-CoV-2 target nucleic acids are DETECTED. The SARS-CoV-2 RNA is generally detectable in upper and lower  respiratory specimens dur ing the acute phase of infection.  Positive  results are indicative of active infection with SARS-CoV-2.  Clinical  correlation with patient history and other diagnostic information is  necessary to determine patient infection status.  Positive results do  not rule out bacterial infection or co-infection with other viruses. If result is PRESUMPTIVE POSTIVE SARS-CoV-2 nucleic acids MAY BE PRESENT.   A presumptive positive result was obtained on the submitted specimen  and confirmed on repeat testing.  While 2019 novel coronavirus  (SARS-CoV-2) nucleic acids may be present in the submitted sample  additional confirmatory testing may be necessary for epidemiological  and / or clinical management purposes  to differentiate between  SARS-CoV-2 and other Sarbecovirus currently known to infect humans.  If clinically indicated additional testing with an alternate test  methodology [phone]) is advised. The SARS-CoV-2 RNA is generally  detectable in upper and lower respiratory sp ecimens during the acute  phase of infection. The expected result is Negative. Fact Sheet for Patients:  StrictlyIdeas.no Fact Sheet for Healthcare Providers: BankingDealers.co.za This test is not yet approved or cleared by the [place] FDA and has been authorized for detection and/or diagnosis of SARS-CoV-2 by FDA under an Emergency Use Authorization (EUA).  This EUA will remain in effect (meaning this test can be used) for the duration of the COVID-19 declaration under Section 564(b)(1)  of the Act, 21 U.S.C. section 360bbb-3(b)(1), unless the authorization is terminated or revoked sooner. Performed at [facility], [place] [address], [place], [place] [address]   MRSA PCR Screening     Status: None   Collection Time: 09/22/18 10:18 PM   Specimen: Nasopharyngeal  Result Value Ref Range Status   MRSA by PCR NEGATIVE NEGATIVE Final    Comment:        The GeneXpert MRSA Assay (FDA approved for NASAL specimens only), is one component of a comprehensive MRSA colonization surveillance program. It is not intended to diagnose MRSA infection nor to guide or monitor treatment for MRSA infections. Performed at [facility], [place] [address], [place], [place] [address]          Radiology Studies: No results found.      Scheduled Meds:  aspirin  324 mg Oral Once   carvedilol  3.125 mg Oral BID WC   furosemide  80 mg Oral Daily   heparin  5,000 Units Subcutaneous Q8H   hydrALAZINE  50 mg Oral Q8H   insulin aspart  0-15 Units Subcutaneous Q4H   insulin aspart  0-5 Units Subcutaneous QHS   insulin aspart  8 Units Subcutaneous TID WC   isosorbide mononitrate  30 mg Oral Daily   potassium chloride  10 mEq Oral BID   sodium chloride flush  3 mL Intravenous Q12H   Continuous Infusions:  sodium chloride       LOS: 3 days   The patient is critically ill with multiple organ systems failure and requires high complexity decision making for assessment and support, frequent evaluation and titration of therapies, application of advanced monitoring technologies and extensive interpretation of multiple databases. Critical Care Time devoted to patient care services described in this note  Time spent: 40 minutes     Soul Deveney, Geraldo Docker, MD [facility] Pager [phone]  If 7PM-7AM, please contact night-coverage [email or web address] Password [email or web address] 09/25/2018, 8:05 AM

## 2018-09-25 NOTE — Plan of Care (Signed)

## 2018-09-25 NOTE — Progress Notes (Signed)
Progress Note  Patient Name: Zoe Clarke Date of Encounter: 09/25/2018  Primary Cardiologist: Skeet Latch, MD   Subjective   Feeling overall better. Less SOB. No CP currently.   Inpatient Medications    Scheduled Meds: . aspirin  324 mg Oral Once  . atorvastatin  20 mg Oral q1800  . carvedilol  3.125 mg Oral BID WC  . furosemide  80 mg Oral Daily  . heparin  5,000 Units Subcutaneous Q8H  . hydrALAZINE  50 mg Oral Q8H  . insulin aspart  0-15 Units Subcutaneous Q4H  . insulin aspart  0-5 Units Subcutaneous QHS  . insulin aspart  8 Units Subcutaneous TID WC  . isosorbide mononitrate  60 mg Oral Daily  . potassium chloride  10 mEq Oral BID  . sodium chloride flush  3 mL Intravenous Q12H   Continuous Infusions: . sodium chloride     PRN Meds: sodium chloride, acetaminophen, albuterol, diphenhydrAMINE, nitroGLYCERIN, ondansetron (ZOFRAN) IV, oxyCODONE-acetaminophen, sodium chloride flush, zolpidem   Vital Signs    Vitals:   09/25/18 0305 09/25/18 0500 09/25/18 0740 09/25/18 0826  BP: (!) 195/76  (!) 183/87 (!) 179/77  Pulse: 63  68 80  Resp: 12  19 12   Temp: 98.2 F (36.8 C)  98.5 F (36.9 C)   TempSrc: Oral  Oral   SpO2: 91%  93% 90%  Weight:  112 kg    Height:        Intake/Output Summary (Last 24 hours) at 09/25/2018 0913 Last data filed at 09/25/2018 0901 Gross per 24 hour  Intake 193 ml  Output -  Net 193 ml   Last 3 Weights 09/25/2018 09/24/2018 09/22/2018  Weight (lbs) 246 lb 14.6 oz 247 lb 12.8 oz 256 lb 13.4 oz  Weight (kg) 112 kg 112.4 kg 116.5 kg      Telemetry    NSR/ sinus tach low 100s with occasional PACs,  One 6 and 3 beat runs of NSVT - Personally Reviewed  ECG    N/A not performed today - Personally Reviewed  Physical Exam   GEN: obese middle aged AAF in No acute distress.   Neck: No JVD Cardiac: RRR, no murmurs, rubs, or gallops.  Respiratory: Clear to auscultation bilaterally. GI: Soft, nontender, non-distended  MS: trace  bilateral LEE; No deformity. Neuro:  Nonfocal  Psych: Normal affect   Labs    High Sensitivity Troponin:   Recent Labs  Lab 09/22/18 1014 09/22/18 1224 09/22/18 1625  TROPONINIHS 27* 28* 28*      Cardiac EnzymesNo results for input(s): TROPONINI in the last 168 hours. No results for input(s): TROPIPOC in the last 168 hours.   Chemistry Recent Labs  Lab 09/22/18 1014 09/23/18 0154 09/24/18 0322 09/25/18 0257  NA 143 140 138 138  K 4.1 3.8 4.1 4.2  CL 106 104 101 100  CO2 27 26 26 26   GLUCOSE 196* 129* 95 116*  BUN 15 15 19 20   CREATININE 1.99* 1.94* 2.23* 2.24*  CALCIUM 8.6* 8.5* 8.7* 8.5*  PROT 6.8  --   --   --   ALBUMIN 2.9*  --   --   --   AST 12*  --   --   --   ALT 15  --   --   --   ALKPHOS 106  --   --   --   BILITOT 0.7  --   --   --   GFRNONAA 26* 26* 22* 22*  GFRAA 30*  31* 26* 26*  ANIONGAP 10 10 11 12      Hematology Recent Labs  Lab 09/22/18 1014 09/25/18 0257  WBC 8.6 8.8  RBC 3.49* 3.35*  3.35*  HGB 9.3* 9.0*  HCT 30.2* 28.1*  MCV 86.5 83.9  MCH 26.6 26.9  MCHC 30.8 32.0  RDW 15.0 14.6  PLT 241 232    BNP Recent Labs  Lab 09/22/18 1014 09/23/18 0206  BNP 1,721.0* 1,496.0*     DDimer No results for input(s): DDIMER in the last 168 hours.   Radiology    No results found.  Cardiac Studies   No new studies this admission.   Old studies- 2D echo 04/28/18 IMPRESSIONS    1. The left ventricle has normal systolic function of 86-76%. The cavity size was normal. There is mildly increased left ventricular wall thickness. Echo evidence of impaired diastolic relaxation Elevated mean left atrial pressure.  2. The right ventricle has normal systolic function. The cavity was normal. There is no increase in right ventricular wall thickness.  3. Left atrial size was mildly dilated.  4. The mitral valve is normal in structure. There is mild thickening.  5. The tricuspid valve is normal in structure.  6. The aortic valve is normal in  structure. Aortic valve regurgitation is mild by color flow Doppler.  7. The inferior vena cava was dilated in size with >50% respiratory variability.  Patient Profile     Zoe Tunison Carteris a 68 y.o.femalewith a hx of chronic diastolicheart failure, hypertension, chronic kidney disease stage III, diabetes, asthma and GERDwho is being seen today for the evaluation of CHFat the request of Dr. Evangeline Gula.  Assessment & Plan    1. Acute on Chronic Diastolic CHF: IV Lasix held yesterday due to slight bump in SCr to 2.2. Unchanged today at 2.2. Plan is to start PO Lasix, 80 mg once daily today. She just got dose. We will monitor UOP and renal function today. Overall feels better, trace bilateral LEE on exam. No crackles noted. No supplemental O2 requirements. We will further increase Coreg for better control of BP and HR. We discussed the importance of low sodium diet and daily weights at home once discharge. She does not currently have a scale at home but will work to obtain one.  Recommend she call cardiology office if > 3 lb wt gain in 24 hr or 5 lb in 1 week.   2. HTN: remains poorly controlled. SBPs in the last 24 hrs have ranged from the upper 150s- 190s. HR currently 100 bpm. Currently on low dose Coreg, 3.125 mg BID, Lasix 80 mg daily, hydralazine 50 mg TID and Imdur 60. Will further titrate Coreg.   3. Stage III CKD: baseline SCr ~1.9-2.1. Stable at 2.2 for the last 2 days. Requiring PO Lasix for chronic diastolic CHF. Will continue at current dose, 80 mg once daily. Avoid nephrotoxic agents. F/u BMP in the AM.   4. DM: per primary team. She is on insulin and ordered for carb modified diet.   5. NSVT: 6 beat run of NSVT noted followed by 3 beat run earlier this morning. Currently NSR. K WNL. Echo 04/2018 showed normal LVEF. We will further increase Coreg to 6.25 mg BID. Continue to monitor on tele.   For questions or updates, please contact [place] Please consult [email or web address] for  contact info under        Signed, Lyda Jester, PA-C  09/25/2018, 9:13 AM

## 2018-09-26 DIAGNOSIS — D631 Anemia in chronic kidney disease: Secondary | ICD-10-CM

## 2018-09-26 DIAGNOSIS — N179 Acute kidney failure, unspecified: Secondary | ICD-10-CM

## 2018-09-26 LAB — GLUCOSE, CAPILLARY
Glucose-Capillary: 107 mg/dL — ABNORMAL HIGH (ref 70–99)
Glucose-Capillary: 119 mg/dL — ABNORMAL HIGH (ref 70–99)
Glucose-Capillary: 150 mg/dL — ABNORMAL HIGH (ref 70–99)
Glucose-Capillary: 210 mg/dL — ABNORMAL HIGH (ref 70–99)
Glucose-Capillary: 81 mg/dL (ref 70–99)
Glucose-Capillary: 93 mg/dL (ref 70–99)

## 2018-09-26 LAB — BASIC METABOLIC PANEL
Anion gap: 9 (ref 5–15)
BUN: 22 mg/dL (ref 8–23)
CO2: 27 mmol/L (ref 22–32)
Calcium: 8 mg/dL — ABNORMAL LOW (ref 8.9–10.3)
Chloride: 101 mmol/L (ref 98–111)
Creatinine, Ser: 2.53 mg/dL — ABNORMAL HIGH (ref 0.44–1.00)
GFR calc Af Amer: 22 mL/min — ABNORMAL LOW (ref >60)
GFR calc non Af Amer: 19 mL/min — ABNORMAL LOW (ref >60)
Glucose, Bld: 131 mg/dL — ABNORMAL HIGH (ref 70–99)
Potassium: 4.1 mmol/L (ref 3.5–5.1)
Sodium: 137 mmol/L (ref 135–145)

## 2018-09-26 LAB — MAGNESIUM: Magnesium: 1.8 mg/dL (ref 1.7–2.4)

## 2018-09-26 MED ORDER — MAGNESIUM OXIDE 400 (241.3 MG) MG PO TABS
400.0000 mg | ORAL_TABLET | Freq: Three times a day (TID) | ORAL | Status: DC
  Administered 2018-09-26 – 2018-09-30 (×12): 400 mg via ORAL
  Filled 2018-09-26 (×12): qty 1

## 2018-09-26 MED ORDER — FUROSEMIDE 40 MG PO TABS
40.0000 mg | ORAL_TABLET | Freq: Every day | ORAL | Status: DC
  Administered 2018-09-28: 40 mg via ORAL
  Filled 2018-09-26: qty 1

## 2018-09-26 MED ORDER — HYDRALAZINE HCL 50 MG PO TABS
75.0000 mg | ORAL_TABLET | Freq: Three times a day (TID) | ORAL | Status: DC
  Administered 2018-09-26 – 2018-09-30 (×12): 75 mg via ORAL
  Filled 2018-09-26 (×12): qty 1

## 2018-09-26 MED ORDER — FUROSEMIDE 40 MG PO TABS: 40.0000 mg | ORAL_TABLET | Freq: Every day | ORAL | Status: DC

## 2018-09-26 NOTE — Progress Notes (Signed)
Physical Therapy Treatment Patient Details Name: Zoe Clarke MRN: [id number] DOB: 06-26-1950 Today's Date: 09/26/2018    History of Present Illness  Zoe Clarke is a 68 y.o. 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     PT Comments    Pt was committed to getting up to strengthen up and get straight home.  Emphasis on transitions to EOB, sit to stand and progressing gait.    Follow Up Recommendations  Home health PT;Supervision/Assistance - 24 hour     Equipment Recommendations  Other (comment)    Recommendations for Other Services       Precautions / Restrictions Precautions Precautions: Fall Restrictions Weight Bearing Restrictions: No    Mobility  Bed Mobility Overal bed mobility: Needs Assistance Bed Mobility: Supine to Sit     Supine to sit: Min assist     General bed mobility comments: truncal assist to come up via R elbow.. pt moved LE's  Transfers Overall transfer level: Needs assistance Equipment used: Rolling walker (2 wheeled) Transfers: Sit to/from Stand Sit to Stand: Min guard         General transfer comment: cues for hand placement  Ambulation/Gait Ambulation/Gait assistance: Min guard Gait Distance (Feet): 60 Feet Assistive device: Rolling walker (2 wheeled) Gait Pattern/deviations: Step-through pattern;Wide base of support     General Gait Details: generally steady with Wide BOS, mildly flexed forward.  Slowed cadence.   Stairs             Wheelchair Mobility    Modified Rankin (Stroke Patients Only)       Balance Overall balance assessment: Needs assistance   Sitting balance-Leahy Scale: Fair        Standing balance-Leahy Scale: Poor Standing balance comment: reliant on RW                            Cognition Arousal/Alertness: Awake/alert Behavior During Therapy: WFL for tasks assessed/performed Overall Cognitive Status: No family/caregiver present to determine baseline cognitive functioning                                        Exercises      General Comments        Pertinent Vitals/Pain Pain Assessment: Faces Faces Pain Scale: Hurts little more Pain Location: right knee/hip/leg Pain Descriptors / Indicators: Aching;Discomfort;Grimacing Pain Intervention(s): Monitored during session    Home Living                      Prior Function            PT Goals (current goals can now be found in the care plan section) Acute Rehab PT Goals Patient Stated Goal: to get home  PT Goal Formulation: With patient Time For Goal Achievement: 10/07/18 Potential to Achieve Goals: Good Progress towards PT goals: Progressing toward goals    Frequency    Min 3X/week      PT Plan Current plan remains appropriate    Co-evaluation              AM-PAC PT "  6 Clicks" Mobility   Outcome Measure  Help needed turning from your back to your side while in a flat bed without using bedrails?: A Little Help needed moving from lying on your back to sitting on the side of a flat bed without using bedrails?: A Little Help needed moving to and from a bed to a chair (including a wheelchair)?: A Little Help needed standing up from a chair using your arms (e.g., wheelchair or bedside chair)?: A Little Help needed to walk in hospital room?: A Little Help needed climbing 3-5 steps with a railing? : A Lot 6 Click Score: 17    End of Session   Activity Tolerance: Patient tolerated treatment well Patient left: with call bell/phone within reach;in bed Nurse Communication: Mobility status PT Visit Diagnosis: Unsteadiness on feet (R26.81);Pain Pain -  Right/Left: Right Pain - part of body: Leg     Time: 6147-0929 PT Time Calculation (min) (ACUTE ONLY): 26 min  Charges:  $Gait Training: 8-22 mins $Therapeutic Activity: 8-22 mins                     09/26/2018  Donnella Sham, PT [facility] [phone]  (pager) [phone]  (office)   Zoe Clarke 09/26/2018, 1:11 PM

## 2018-09-26 NOTE — Plan of Care (Signed)
Patient is progressing to meet care plan goals.  

## 2018-09-26 NOTE — Progress Notes (Signed)
PROGRESS NOTE    Zoe Clarke  IRJ:[id number] DOB: 1950/04/09 DOA: 09/22/2018 PCP: Antony Contras, MD    Brief Narrative:  68 year old female who presented with dyspnea and bilateral lower extremity edema.  She does have significant past medical history for systolic heart failure, chronic kidney disease stage III, type 2 diabetes mellitus, and hypertension.  Reported worsening lower extremity edema for a few days prior to hospitalization, she had increase her water intake and was not compliant with her diuretic and antihypertensive medications (clonidine).  On her initial physical examination blood pressure was 215/98, 188/141, pulse rate 78, respiratory rate 23, oxygen saturation 97%.  Her lungs had decreased breath sounds bilaterally, positive end expiratory wheezing and increased work of breathing, heart S1-S2 present, rhythmic, abdomen nondistended, protuberant, lower extremity with positive edema.  Sodium 143, potassium 4.1, chloride 106, bicarb 27, glucose 196, BUN 15, creatinine 1.9, BNP 1721.  Chest radiograph with cardiomegaly, bilateral lower lobes interstitial infiltrates and small bibasilar pleural effusions.  EKG 81 bpm, normal axis, normal intervals, sinus rhythm with normal conduction, positive PVC, poor R wave progression.  Patient was admitted to the hospital working diagnosis of acute on chronic systolic heart failure decompensation.  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 Acute on chronic diastolic heart failure. . Her urine output over last 24 H is 600 ml, net fluid balance since admission is negative -2,995 ml this am 13 to 301 mmHg systolic. Dyspnea continue to improve, oxymetry 97% on  Room air. Blood pressure Continue heart failure management with carvedilol and after load reduction with hydralazine-isosorbide. Currently with oral furosemide 40 mg daily. Patient had NSVT, about 2 days ago, will continue telemetry monitoring.   2. AKI on CKD stage 3. This am worsening renal function with serum cr at 2,53 from 2,24, K at 4,1 and serum bicarbonate at 27. Will continue to hold furosemide for now and will follow on renal panel in am, avoid hypotension or nephrotoxic medications. Will hold on K supplements for now. For hypomagnesemia, will start on oral mag oxide tid. Keep Mg at 2,0 and K at 4,0.   3. T2DM. Will continue glucose cover and monitoring with insulin sliding scale, patient is tolerating po well.   4. HTN. Continue blood pressure control with hydralazine, isosorbide and carvedilol.   5. Anemia of chronic renal disease. Cell count has been stable.    DVT prophylaxis: heparin   Code Status:  Partial  Family Communication:  No family at the bedside  Disposition Plan/ discharge barriers: pending recovery of renal function. Declined SNF, will dc with home health.   Body mass index is 37.38 kg/m. Malnutrition Type:      Malnutrition Characteristics:      Nutrition Interventions:     RN Pressure Injury Documentation:     Consultants:   Cardiology   Procedures:     Antimicrobials:       Subjective: Patient is feeling better, but not yet  back to baseline, continue to have dyspnea, and generalized weakness, no nausea or vomiting, no chest pain.   Objective: Vitals:   09/25/18 2322 09/26/18 0308 09/26/18 0617 09/26/18 0720  BP: (!) 175/73 (!) 175/87 (!) 184/69 (!) 155/72  Pulse:   93 96   Resp: 16 16  19   Temp: 98.5 F (36.9 C) 98.2 F (36.8 C)  98.4 F (36.9 C)  TempSrc:  Oral  Oral  SpO2:    97%  Weight:  111.5 kg    Height:        Intake/Output Summary (Last 24 hours) at 09/26/2018 0817 Last data filed at 09/26/2018 0720 Gross per 24 hour  Intake 396 ml  Output 700 ml  Net -304 ml   Filed Weights   09/24/18 0536 09/25/18 0500 09/26/18 0308  Weight: 112.4 kg 112 kg 111.5 kg    Examination:   General: deconditioned  Neurology: Awake and alert, non focal  E ENT: no pallor, no icterus, oral mucosa moist Cardiovascular: No JVD. S1-S2 present, rhythmic, no gallops, rubs, or murmurs. Trace lower extremity edema. Pulmonary: decreased breath sounds bilaterally, decreased air movement, no wheezing, rhonchi or rales. Gastrointestinal. Abdomen protuberant no organomegaly, non tender, no rebound or guarding Skin. No rashes Musculoskeletal: no joint deformities     Data Reviewed: I have personally reviewed following labs and imaging studies  CBC: Recent Labs  Lab 09/22/18 1014 09/25/18 0257  WBC 8.6 8.8  NEUTROABS 6.1  --   HGB 9.3* 9.0*  HCT 30.2* 28.1*  MCV 86.5 83.9  PLT 241 846   Basic Metabolic Panel: Recent Labs  Lab 09/22/18 1014 09/23/18 0154 09/24/18 0322 09/25/18 0257 09/26/18 0228  NA 143 140 138 138 137  K 4.1 3.8 4.1 4.2 4.1  CL 106 104 101 100 101  CO2 27 26 26 26 27   GLUCOSE 196* 129* 95 116* 131*  BUN 15 15 19 20 22   CREATININE 1.99* 1.94* 2.23* 2.24* 2.53*  CALCIUM 8.6* 8.5* 8.7* 8.5* 8.0*  MG  --  1.8  --  1.8 1.8   GFR: Estimated Creatinine Clearance: 28.6 mL/min (A) (by C-G formula based on SCr of 2.53 mg/dL (H)). Liver Function Tests: Recent Labs  Lab 09/22/18 1014  AST 12*  ALT 15  ALKPHOS 106  BILITOT 0.7  PROT 6.8  ALBUMIN 2.9*   No results for input(s): LIPASE, AMYLASE in the last 168 hours. No results for input(s): AMMONIA in the last 168 hours. Coagulation Profile: No results for input(s): INR, PROTIME  in the last 168 hours. Cardiac Enzymes: No results for input(s): CKTOTAL, CKMB, CKMBINDEX, TROPONINI in the last 168 hours. BNP (last 3 results) No results for input(s): PROBNP in the last 8760 hours. HbA1C: No results for input(s): HGBA1C in the last 72 hours. CBG: Recent Labs  Lab 09/25/18 1225 09/25/18 1624 09/25/18 2017 09/25/18 2328 09/26/18 0347  GLUCAP 149* 129* 166* 133* 150*   Lipid Profile: Recent Labs    09/25/18 0257  CHOL 124  HDL 46  LDLCALC 55  TRIG 115  CHOLHDL 2.7   Thyroid Function Tests: No results for input(s): TSH, T4TOTAL, FREET4, T3FREE, THYROIDAB in the last 72 hours. Anemia Panel: Recent Labs    09/25/18 0257  VITAMINB12 305  FOLATE 9.4  FERRITIN 156  TIBC 234*  IRON 38  RETICCTPCT 2.1      Radiology Studies: I have reviewed all of the imaging during this hospital visit personally     Scheduled Meds: . aspirin  324 mg Oral Once  . atorvastatin  20 mg Oral q1800  . carvedilol  6.25 mg Oral BID WC  . furosemide  80 mg Oral Daily  . heparin  5,000 Units Subcutaneous Q8H  . hydrALAZINE  50 mg Oral Q8H  . insulin aspart  0-5 Units Subcutaneous QHS  . insulin aspart  0-9 Units Subcutaneous Q4H  . insulin aspart  8 Units Subcutaneous TID WC  . isosorbide mononitrate  60 mg Oral Daily  . potassium chloride  10 mEq Oral BID  . sodium chloride flush  3 mL Intravenous Q12H   Continuous Infusions: . sodium chloride       LOS: 4 days        Shellene Sweigert Gerome Apley, MD

## 2018-09-26 NOTE — Care Management Important Message (Signed)
Important Message  Patient Details  Name: Zoe Clarke MRN: [id number] Date of Birth: 07/30/1950   Medicare Important Message Given:  Yes     Shelda Altes 09/26/2018, 1:44 PM

## 2018-09-26 NOTE — Progress Notes (Signed)
Progress Note  Patient Name: Zoe Clarke Date of Encounter: 09/26/2018  Primary Cardiologist: Skeet Latch, MD   Subjective    No issues overnight- BP remains elevated. Mildly net negative yesterday with oral lasix.  Creatinine up further to 2.53.   Inpatient Medications    Scheduled Meds:  aspirin  324 mg Oral Once   atorvastatin  20 mg Oral q1800   carvedilol  6.25 mg Oral BID WC   furosemide  80 mg Oral Daily   heparin  5,000 Units Subcutaneous Q8H   hydrALAZINE  50 mg Oral Q8H   insulin aspart  0-5 Units Subcutaneous QHS   insulin aspart  0-9 Units Subcutaneous Q4H   insulin aspart  8 Units Subcutaneous TID WC   isosorbide mononitrate  60 mg Oral Daily   potassium chloride  10 mEq Oral BID   sodium chloride flush  3 mL Intravenous Q12H   Continuous Infusions:  sodium chloride     PRN Meds: sodium chloride, acetaminophen, albuterol, diphenhydrAMINE, nitroGLYCERIN, ondansetron (ZOFRAN) IV, oxyCODONE-acetaminophen, sodium chloride flush, zolpidem   Vital Signs    Vitals:   09/25/18 2322 09/26/18 0308 09/26/18 0617 09/26/18 0720  BP: (!) 175/73 (!) 175/87 (!) 184/69 (!) 155/72  Pulse:   93 96  Resp: 16 16  19   Temp: 98.5 F (36.9 C) 98.2 F (36.8 C)  98.4 F (36.9 C)  TempSrc:  Oral  Oral  SpO2:    97%  Weight:  111.5 kg    Height:        Intake/Output Summary (Last 24 hours) at 09/26/2018 0954 Last data filed at 09/26/2018 0720 Gross per 24 hour  Intake 393 ml  Output 700 ml  Net -307 ml   Last 3 Weights 09/26/2018 09/25/2018 09/24/2018  Weight (lbs) 245 lb 13 oz 246 lb 14.6 oz 247 lb 12.8 oz  Weight (kg) 111.5 kg 112 kg 112.4 kg      Telemetry    NSR/ sinus tach low 100s with occasional PACs,  One 6 and 3 beat runs of NSVT - Personally Reviewed  ECG    N/A not performed today - Personally Reviewed  Physical Exam   GEN: obese middle aged AAF in No acute distress.   Neck: No JVD Cardiac: RRR, no murmurs, rubs, or gallops.    Respiratory: Clear to auscultation bilaterally. GI: Soft, nontender, non-distended  MS: trace bilateral LEE; No deformity. Neuro:  Nonfocal  Psych: Normal affect   Labs    High Sensitivity Troponin:   Recent Labs  Lab 09/22/18 1014 09/22/18 1224 09/22/18 1625  TROPONINIHS 27* 28* 28*      Cardiac EnzymesNo results for input(s): TROPONINI in the last 168 hours. No results for input(s): TROPIPOC in the last 168 hours.   Chemistry Recent Labs  Lab 09/22/18 1014  09/24/18 0322 09/25/18 0257 09/26/18 0228  NA 143   < > 138 138 137  K 4.1   < > 4.1 4.2 4.1  CL 106   < > 101 100 101  CO2 27   < > 26 26 27   GLUCOSE 196*   < > 95 116* 131*  BUN 15   < > 19 20 22   CREATININE 1.99*   < > 2.23* 2.24* 2.53*  CALCIUM 8.6*   < > 8.7* 8.5* 8.0*  PROT 6.8  --   --   --   --   ALBUMIN 2.9*  --   --   --   --  AST 12*  --   --   --   --   ALT 15  --   --   --   --   ALKPHOS 106  --   --   --   --   BILITOT 0.7  --   --   --   --   GFRNONAA 26*   < > 22* 22* 19*  GFRAA 30*   < > 26* 26* 22*  ANIONGAP 10   < > 11 12 9    < > = values in this interval not displayed.     Hematology Recent Labs  Lab 09/22/18 1014 09/25/18 0257  WBC 8.6 8.8  RBC 3.49* 3.35*   3.35*  HGB 9.3* 9.0*  HCT 30.2* 28.1*  MCV 86.5 83.9  MCH 26.6 26.9  MCHC 30.8 32.0  RDW 15.0 14.6  PLT 241 232    BNP Recent Labs  Lab 09/22/18 1014 09/23/18 0206  BNP 1,721.0* 1,496.0*     DDimer No results for input(s): DDIMER in the last 168 hours.   Radiology    No results found.  Cardiac Studies   No new studies this admission.   Old studies- 2D echo 04/28/18 IMPRESSIONS    1. The left ventricle has normal systolic function of 32-35%. The cavity size was normal. There is mildly increased left ventricular wall thickness. Echo evidence of impaired diastolic relaxation Elevated mean left atrial pressure.  2. The right ventricle has normal systolic function. The cavity was normal. There is no  increase in right ventricular wall thickness.  3. Left atrial size was mildly dilated.  4. The mitral valve is normal in structure. There is mild thickening.  5. The tricuspid valve is normal in structure.  6. The aortic valve is normal in structure. Aortic valve regurgitation is mild by color flow Doppler.  7. The inferior vena cava was dilated in size with >50% respiratory variability.  Patient Profile     Zoe Boom Carteris a 68 y.o.femalewith a hx of chronic diastolicheart failure, hypertension, chronic kidney disease stage III, diabetes, asthma and GERDwho is being seen today for the evaluation of CHFat the request of Dr. Evangeline Gula.  Assessment & Plan    1. Acute on Chronic Diastolic CHF:  - on 80 mg po lasix, had received today- creatinine unfortunately higher. Would hold additional lasix tomorrow and restart at 40 mg daily on Sunday to allow creatinine recovery.  2. HTN: remains poorly controlled. SBPs in the last 24 hrs have ranged from the upper 150s- 170's. Coreg increased yesterday - will increase hydralazine to 75 mg TID today.  3. Stage III CKD: baseline SCr ~1.9-2.1. Creatinine higher today - unfortunately, had received AM lasix. Hold lasix tomorrow - consider restart lower dose lasix 40 mg daily on Sunday.  4. DM: per primary team. She is on insulin and ordered for carb modified diet.   5. NSVT: Stable telemetry overnight.  For questions or updates, please contact [place] Please consult [email or web address] for contact info under    Pixie Casino, MD, FACC, [place] Director of the [facility] Diplomate of the American Board of Clinical Lipidology Attending Cardiologist  Direct Dial: [phone]   Fax: [phone]  Website:  [email or web address]  Pixie Casino, MD  09/26/2018, 9:54 AM

## 2018-09-26 NOTE — Progress Notes (Signed)
Patient c/o sudden nausea and vomited approx 54ml of clear emesis in an emesis basin.  Patient medicated w/Zofran IV per PRN orders.  Patient states she had similar n/v prior to admission.  Will continue to monitor.

## 2018-09-27 DIAGNOSIS — Z720 Tobacco use: Secondary | ICD-10-CM

## 2018-09-27 DIAGNOSIS — Z716 Tobacco abuse counseling: Secondary | ICD-10-CM

## 2018-09-27 DIAGNOSIS — G4734 Idiopathic sleep related nonobstructive alveolar hypoventilation: Secondary | ICD-10-CM

## 2018-09-27 LAB — BASIC METABOLIC PANEL
Anion gap: 13 (ref 5–15)
BUN: 25 mg/dL — ABNORMAL HIGH (ref 8–23)
CO2: 23 mmol/L (ref 22–32)
Calcium: 8.2 mg/dL — ABNORMAL LOW (ref 8.9–10.3)
Chloride: 102 mmol/L (ref 98–111)
Creatinine, Ser: 2.63 mg/dL — ABNORMAL HIGH (ref 0.44–1.00)
GFR calc Af Amer: 21 mL/min — ABNORMAL LOW (ref >60)
GFR calc non Af Amer: 18 mL/min — ABNORMAL LOW (ref >60)
Glucose, Bld: 88 mg/dL (ref 70–99)
Potassium: 4.5 mmol/L (ref 3.5–5.1)
Sodium: 138 mmol/L (ref 135–145)

## 2018-09-27 LAB — GLUCOSE, CAPILLARY
Glucose-Capillary: 92 mg/dL (ref 70–99)
Glucose-Capillary: 136 mg/dL — ABNORMAL HIGH (ref 70–99)
Glucose-Capillary: 96 mg/dL (ref 70–99)
Glucose-Capillary: 189 mg/dL — ABNORMAL HIGH (ref 70–99)
Glucose-Capillary: 148 mg/dL — ABNORMAL HIGH (ref 70–99)

## 2018-09-27 LAB — MAGNESIUM: Magnesium: 2 mg/dL (ref 1.7–2.4)

## 2018-09-27 MED ORDER — CARVEDILOL 12.5 MG PO TABS
12.5000 mg | ORAL_TABLET | Freq: Two times a day (BID) | ORAL | Status: DC
  Administered 2018-09-27 – 2018-09-30 (×6): 12.5 mg via ORAL
  Filled 2018-09-27 (×6): qty 1

## 2018-09-27 NOTE — Plan of Care (Signed)

## 2018-09-27 NOTE — Progress Notes (Signed)
PROGRESS NOTE    Zoe Clarke  YIF:[id number] DOB: January 31, 1951 DOA: 09/22/2018 PCP: Antony Contras, MD    Brief Narrative:  68 year old female who presented with dyspnea and bilateral lower extremity edema.  Past medical history significant for diastolic heart failure with EF of 60 to 65%, chronic kidney disease stage III, type 2 diabetes mellitus, and hypertension.  Reported worsening lower extremity edema for a few days prior to hospitalization, she had increase her water intake and was not compliant with her diuretic and antihypertensive medications (clonidine).  On her initial physical examination blood pressure was 215/98, 188/141, pulse rate 78, respiratory rate 23, oxygen saturation 97%.  Her lungs had decreased breath sounds bilaterally, positive end expiratory wheezing and increased work of breathing, heart S1-S2 present, rhythmic, abdomen nondistended, protuberant, lower extremity with positive edema.  Sodium 143, potassium 4.1, chloride 106, bicarb 27, glucose 196, BUN 15, creatinine 1.9, BNP 1721.  Chest radiograph with cardiomegaly, bilateral lower lobes interstitial infiltrates and small bibasilar pleural effusions.  EKG 81 bpm, normal axis, normal intervals, sinus rhythm with normal conduction, positive PVC, poor R wave progression.  Patient was admitted to the hospital working diagnosis of acute on chronic diastolic heart failure.  09/27/2018: Serum creatinine is 2.63 today (still rising).  Blood pressure is not optimized.  Cardiology input is appreciated.  Cardiology has advised the patient should remain in the hospital for now.  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 Acute on chronic diastolic heart failure. . Her urine output over last 24 H is 600 ml, net fluid balance since admission is negative -2,995 ml this am 13 to 867 mmHg systolic. Dyspnea continue to improve, oxymetry 97% on  Room air. Blood pressure Continue heart failure management with carvedilol and after load reduction with hydralazine-isosorbide. Currently with oral furosemide 40 mg daily. Patient had NSVT, about 2 days ago, will continue telemetry monitoring. 09/27/2018: Symptoms have improved significantly.  Cardiology team is directing care.  2. AKI on CKD stage 3. This am worsening renal function with serum cr at 2,53 from 2,24, K at 4,1 and serum bicarbonate at 27. Will continue to hold furosemide for now and will follow on renal panel in am, avoid hypotension or nephrotoxic medications. Will hold on K supplements for now. For hypomagnesemia, will start on oral mag oxide tid. Keep Mg at 2,0 and K at 4,0.  09/27/2018: Worsening renal function.  Diuretics to be on hold.  Continue to monitor renal function.  3. T2DM. Will continue glucose cover and monitoring with insulin sliding scale, patient is tolerating po well.   4. HTN. Continue blood pressure control with hydralazine, isosorbide and carvedilol. 09/27/2018: Continue to optimize.  5. Anemia of chronic renal disease. Cell count has been stable.    DVT prophylaxis: heparin   Code Status:  Partial  Family Communication:  No family at the bedside  Disposition Plan/ discharge barriers: pending recovery of renal function. Declined SNF, will dc with home health.  Body mass index is 37.17 kg/m. Malnutrition Type:   Consultants:   Cardiology   Subjective: Patient seen. No new symptoms. No  worsening of shortness of breath No chest pain  Objective: Vitals:   09/27/18 0347 09/27/18 0400 09/27/18 0600 09/27/18 0737  BP: (!) 171/90   (!) 153/81  Pulse: 85   97  Resp: 14 13  18   Temp: 98.2 F (36.8 C)   98 F (36.7 C)  TempSrc: Oral   Oral  SpO2: 100%   98%  Weight:   110.9 kg   Height:        Intake/Output Summary (Last 24 hours) at 09/27/2018 0955 Last data filed at 09/27/2018 0759 Gross per 24 hour  Intake 480 ml  Output 800 ml  Net -320 ml   Filed Weights   09/25/18 0500 09/26/18 0308 09/27/18 0600  Weight: 112 kg 111.5 kg 110.9 kg    Examination:   General: Not in any distress.  Patient is awake and alert. Neurology: Awake and alert, patient moves all extremities.    E ENT: no pallor, no icterus, oral mucosa moist Cardiovascular: No JVD. S1-S2 bilateral leg edema.   Pulmonary: Decreased breath sounds globally. Gastrointestinal. Abdomen is obese, nontender.  Organs are difficult to assess.    Data Reviewed: I have personally reviewed following labs and imaging studies  CBC: Recent Labs  Lab 09/22/18 1014 09/25/18 0257  WBC 8.6 8.8  NEUTROABS 6.1  --   HGB 9.3* 9.0*  HCT 30.2* 28.1*  MCV 86.5 83.9  PLT 241 425   Basic Metabolic Panel: Recent Labs  Lab 09/23/18 0154 09/24/18 0322 09/25/18 0257 09/26/18 0228 09/27/18 0242  NA 140 138 138 137 138  K 3.8 4.1 4.2 4.1 4.5  CL 104 101 100 101 102  CO2 26 26 26 27 23   GLUCOSE 129* 95 116* 131* 88  BUN 15 19 20 22  25*  CREATININE 1.94* 2.23* 2.24* 2.53* 2.63*  CALCIUM 8.5* 8.7* 8.5* 8.0* 8.2*  MG 1.8  --  1.8 1.8 2.0   GFR: Estimated Creatinine Clearance: 27.5 mL/min (A) (by C-G formula based on SCr of 2.63 mg/dL (H)). Liver Function Tests: Recent Labs  Lab 09/22/18 1014  AST 12*  ALT 15  ALKPHOS 106  BILITOT 0.7  PROT 6.8  ALBUMIN 2.9*   No results for input(s): LIPASE, AMYLASE in the last 168 hours. No results for input(s): AMMONIA in the last 168 hours. Coagulation Profile:  No results for input(s): INR, PROTIME in the last 168 hours. Cardiac Enzymes: No results for input(s): CKTOTAL, CKMB, CKMBINDEX, TROPONINI in the last 168 hours. BNP (last 3 results) No results for input(s): PROBNP in the last 8760 hours. HbA1C: No results for input(s): HGBA1C in the last 72 hours. CBG: Recent Labs  Lab 09/26/18 1557 09/26/18 1941 09/26/18 2353 09/27/18 0338 09/27/18 0720  GLUCAP 81 119* 93 92 136*   Lipid Profile: Recent Labs    09/25/18 0257  CHOL 124  HDL 46  LDLCALC 55  TRIG 115  CHOLHDL 2.7   Thyroid Function Tests: No results for input(s): TSH, T4TOTAL, FREET4, T3FREE, THYROIDAB in the last 72 hours. Anemia Panel: Recent Labs    09/25/18 0257  VITAMINB12 305  FOLATE 9.4  FERRITIN 156  TIBC 234*  IRON 38  RETICCTPCT 2.1      Radiology Studies: I have reviewed all of the imaging during this hospital visit personally     Scheduled Meds: . aspirin  324 mg Oral Once  .  atorvastatin  20 mg Oral q1800  . carvedilol  12.5 mg Oral BID WC  . [START ON 09/28/2018] furosemide  40 mg Oral Daily  . heparin  5,000 Units Subcutaneous Q8H  . hydrALAZINE  75 mg Oral Q8H  . insulin aspart  0-5 Units Subcutaneous QHS  . insulin aspart  0-9 Units Subcutaneous Q4H  . insulin aspart  8 Units Subcutaneous TID WC  . isosorbide mononitrate  60 mg Oral Daily  . magnesium oxide  400 mg Oral TID  . sodium chloride flush  3 mL Intravenous Q12H   Continuous Infusions: . sodium chloride       LOS: 5 days    Bonnell Public, MD

## 2018-09-27 NOTE — Progress Notes (Signed)
Progress Note  Patient Name: Zoe Clarke Date of Encounter: 09/27/2018  Primary Cardiologist: Skeet Latch, MD   Subjective    No issues overnight- BP remains elevated. Mildly net negative yesterday with oral lasix.  Creatinine up further to 2.63.  Inpatient Medications    Scheduled Meds: . aspirin  324 mg Oral Once  . atorvastatin  20 mg Oral q1800  . carvedilol  12.5 mg Oral BID WC  . [START ON 09/28/2018] furosemide  40 mg Oral Daily  . heparin  5,000 Units Subcutaneous Q8H  . hydrALAZINE  75 mg Oral Q8H  . insulin aspart  0-5 Units Subcutaneous QHS  . insulin aspart  0-9 Units Subcutaneous Q4H  . insulin aspart  8 Units Subcutaneous TID WC  . isosorbide mononitrate  60 mg Oral Daily  . magnesium oxide  400 mg Oral TID  . sodium chloride flush  3 mL Intravenous Q12H   Continuous Infusions: . sodium chloride     PRN Meds: sodium chloride, acetaminophen, albuterol, diphenhydrAMINE, nitroGLYCERIN, ondansetron (ZOFRAN) IV, oxyCODONE-acetaminophen, sodium chloride flush, zolpidem   Vital Signs    Vitals:   09/27/18 0347 09/27/18 0400 09/27/18 0600 09/27/18 0737  BP: (!) 171/90   (!) 153/81  Pulse: 85   97  Resp: 14 13    Temp: 98.2 F (36.8 C)   98 F (36.7 C)  TempSrc: Oral   Oral  SpO2: 100%   98%  Weight:   110.9 kg   Height:        Intake/Output Summary (Last 24 hours) at 09/27/2018 0917 Last data filed at 09/27/2018 0759 Gross per 24 hour  Intake 480 ml  Output 800 ml  Net -320 ml   Last 3 Weights 09/27/2018 09/26/2018 09/25/2018  Weight (lbs) 244 lb 7.8 oz 245 lb 13 oz 246 lb 14.6 oz  Weight (kg) 110.9 kg 111.5 kg 112 kg      Telemetry    NSR/ sinus tach low 100s with occasional PACsPersonally Reviewed   ECG    N/A not performed today - Personally Reviewed  Physical Exam   GEN: obese middle aged AAF in No acute distress.   Neck: No JVD Cardiac: RRR, no murmurs, rubs, or gallops.  Respiratory: Clear to auscultation bilaterally. GI:  Soft, nontender, non-distended  MS: trace bilateral LEE; No deformity. Neuro:  Nonfocal  Psych: Normal affect   Labs    High Sensitivity Troponin:   Recent Labs  Lab 09/22/18 1014 09/22/18 1224 09/22/18 1625  TROPONINIHS 27* 28* 28*      Cardiac EnzymesNo results for input(s): TROPONINI in the last 168 hours. No results for input(s): TROPIPOC in the last 168 hours.   Chemistry Recent Labs  Lab 09/22/18 1014  09/25/18 0257 09/26/18 0228 09/27/18 0242  NA 143   < > 138 137 138  K 4.1   < > 4.2 4.1 4.5  CL 106   < > 100 101 102  CO2 27   < > 26 27 23   GLUCOSE 196*   < > 116* 131* 88  BUN 15   < > 20 22 25*  CREATININE 1.99*   < > 2.24* 2.53* 2.63*  CALCIUM 8.6*   < > 8.5* 8.0* 8.2*  PROT 6.8  --   --   --   --   ALBUMIN 2.9*  --   --   --   --   AST 12*  --   --   --   --  ALT 15  --   --   --   --   ALKPHOS 106  --   --   --   --   BILITOT 0.7  --   --   --   --   GFRNONAA 26*   < > 22* 19* 18*  GFRAA 30*   < > 26* 22* 21*  ANIONGAP 10   < > 12 9 13    < > = values in this interval not displayed.     Hematology Recent Labs  Lab 09/22/18 1014 09/25/18 0257  WBC 8.6 8.8  RBC 3.49* 3.35*  3.35*  HGB 9.3* 9.0*  HCT 30.2* 28.1*  MCV 86.5 83.9  MCH 26.6 26.9  MCHC 30.8 32.0  RDW 15.0 14.6  PLT 241 232    BNP Recent Labs  Lab 09/22/18 1014 09/23/18 0206  BNP 1,721.0* 1,496.0*     DDimer No results for input(s): DDIMER in the last 168 hours.   Radiology    No results found.  Cardiac Studies   No new studies this admission.   Old studies- 2D echo 04/28/18 IMPRESSIONS    1. The left ventricle has normal systolic function of 48-18%. The cavity size was normal. There is mildly increased left ventricular wall thickness. Echo evidence of impaired diastolic relaxation Elevated mean left atrial pressure.  2. The right ventricle has normal systolic function. The cavity was normal. There is no increase in right ventricular wall thickness.  3. Left  atrial size was mildly dilated.  4. The mitral valve is normal in structure. There is mild thickening.  5. The tricuspid valve is normal in structure.  6. The aortic valve is normal in structure. Aortic valve regurgitation is mild by color flow Doppler.  7. The inferior vena cava was dilated in size with >50% respiratory variability.  Patient Profile     Zoe Aja Carteris a 68 y.o.femalewith a hx of chronic diastolicheart failure, hypertension, chronic kidney disease stage III, diabetes, asthma and GERDwho is being seen today for the evaluation of CHFat the request of Dr. Evangeline Gula.  Assessment & Plan    1. Acute on Chronic Diastolic CHF:  - on 40 mg po lasix, had received today- creatinine unfortunately higher. Would hold additional lasix today and recheck BMET tomorrow.   2. HTN: remains poorly controlled. SBPs in the last 24 hrs have ranged from the upper 150s- 170's. Coreg increased yesterday to 6.25 mg PO BID. Will increase to 12.5 mg PO BID  - will increase hydralazine to 75 mg TID today.  3. Stage III CKD: baseline SCr ~1.9-2.1. Creatinine higher today - unfortunately, had received AM lasix. Hold lasix tomorrow - consider restart lower dose lasix 40 mg daily on Sunday.  4. DM: per primary team. She is on insulin and ordered for carb modified diet.   5. NSVT: Stable telemetry overnight.  For questions or updates, please contact [place] Please consult [email or web address] for contact info under    Major issues are diuresis, renal function and blood pressure control.  We will continue to follow serum creatinine and hold diuretics until she starts to show a decline.  No further recommendations at this time.  We will see again on Monday.  Quay Burow, MD  09/27/2018, 9:17 AM

## 2018-09-27 NOTE — Plan of Care (Signed)
Patient is progressing to meet goals of care on care Plan.

## 2018-09-28 LAB — RENAL FUNCTION PANEL
Albumin: 2.5 g/dL — ABNORMAL LOW (ref 3.5–5.0)
Anion gap: 11 (ref 5–15)
BUN: 28 mg/dL — ABNORMAL HIGH (ref 8–23)
CO2: 28 mmol/L (ref 22–32)
Calcium: 8.3 mg/dL — ABNORMAL LOW (ref 8.9–10.3)
Chloride: 99 mmol/L (ref 98–111)
Creatinine, Ser: 2.92 mg/dL — ABNORMAL HIGH (ref 0.44–1.00)
GFR calc Af Amer: 19 mL/min — ABNORMAL LOW (ref >60)
GFR calc non Af Amer: 16 mL/min — ABNORMAL LOW (ref >60)
Glucose, Bld: 166 mg/dL — ABNORMAL HIGH (ref 70–99)
Phosphorus: 4.4 mg/dL (ref 2.5–4.6)
Potassium: 4.4 mmol/L (ref 3.5–5.1)
Sodium: 138 mmol/L (ref 135–145)

## 2018-09-28 LAB — MAGNESIUM: Magnesium: 2.3 mg/dL (ref 1.7–2.4)

## 2018-09-28 LAB — GLUCOSE, CAPILLARY
Glucose-Capillary: 182 mg/dL — ABNORMAL HIGH (ref 70–99)
Glucose-Capillary: 204 mg/dL — ABNORMAL HIGH (ref 70–99)
Glucose-Capillary: 98 mg/dL (ref 70–99)
Glucose-Capillary: 90 mg/dL (ref 70–99)
Glucose-Capillary: 112 mg/dL — ABNORMAL HIGH (ref 70–99)

## 2018-09-28 NOTE — Plan of Care (Signed)
Patient has met most of the goals of care on her care plans. She is ready for discharge when appropriate.

## 2018-09-28 NOTE — Progress Notes (Signed)
PROGRESS NOTE    Zoe Clarke  EXN:[id number] DOB: 30-Dec-1950 DOA: 09/22/2018 PCP: Antony Contras, MD    Brief Narrative:  Patient is a 68 year old African-American female with past medical history significant for diastolic heart failure with EF of 60 to 65%, chronic kidney disease stage III, type 2 diabetes mellitus, and hypertension.  Patient presented with dyspnea and worsening bilateral lower extremity edema.  Apparently, patient had increased her water intake and was not compliant with her diuretic and antihypertensive medications (clonidine).  On her initial physical examination blood pressure was 215/98, 188/141, pulse rate 78, respiratory rate 23, oxygen saturation 97%.  Her lungs had decreased breath sounds bilaterally, positive end expiratory wheezing and increased work of breathing, heart S1-S2 present, rhythmic, abdomen nondistended, protuberant, lower extremity with positive edema.  Sodium 143, potassium 4.1, chloride 106, bicarb 27, glucose 196, BUN 15, creatinine 1.9, BNP 1721.  Chest radiograph with cardiomegaly, bilateral lower lobes interstitial infiltrates and small bibasilar pleural effusions.  EKG 81 bpm, normal axis, normal intervals, sinus rhythm with normal conduction, positive PVC, poor R wave progression.  Patient was admitted for further assessment and management of acute on chronic diastolic heart failure.  Worsening renal function was noted with diuretics.  Diuretics are currently on hold.  Renal function is being monitored.  Serum creatinine today is 2.92, up from 2.63.  We will continue to monitor renal function.  Blood pressure this morning was 160/80 mmHg.  Cardiology input is appreciated.  Further management will depend on hospital course.  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   Continue to monitor I's and O's  Continue to monitor patient's weight  Continue to monitor renal function and electrolytes  Cardiology team is assisting in directing care.   Further management will depend on hospital course.   AKI on CKD stage 3: Worsening renal function is noted.   Worsening renal function is likely due to diuresis.   Serum creatinine is 2.92.   Likely DC home when renal function stabilizes.   Optimization of volume status, cardiac symptoms and renal function may be challenging.    Type 2 diabetes mellitus:  Continue with sliding scale insulin coverage.   Continue to optimize.    Hypertension: Continue blood pressure control with hydralazine, isosorbide and carvedilol. As documented above, blood pressure this morning was 160/80 mmHg.    Anemia of chronic renal disease: Stable Continue to monitor.   DVT prophylaxis: heparin  Code Status:  Partial  Family Communication:     Disposition Plan/ discharge barriers: pending recovery of renal function. Declined SNF, will dc with home health.   Body mass index is 37.54 kg/m. Malnutrition Type:  Consultants:   Cardiology   Subjective: Patient seen. Reports ankle edema today No worsening of shortness of breath No chest pain  Objective: Vitals:   09/28/18 0400 09/28/18 0500 09/28/18 0729 09/28/18 1106  BP:   (!) 177/78 Marland Kitchen)  152/70  Pulse:   80 87  Resp: 14  14 15   Temp:   98 F (36.7 C) 98.4 F (36.9 C)  TempSrc:   Oral Oral  SpO2:   97% 96%  Weight:  112 kg    Height:         Intake/Output Summary (Last 24 hours) at 09/28/2018 1444 Last data filed at 09/28/2018 1301 Gross per 24 hour  Intake 720 ml  Output 501 ml  Net 219 ml   Filed Weights   09/26/18 0308 09/27/18 0600 09/28/18 0500  Weight: 111.5 kg 110.9 kg 112 kg    Examination:   General: Not in any distress.  Patient is awake and alert. Neurology: Awake and alert, patient moves all extremities.    E ENT: no pallor, no icterus, oral mucosa moist Cardiovascular: No JVD. S1-S2 bilateral leg edema.   Pulmonary: Decreased breath sounds globally. Gastrointestinal. Abdomen is obese, nontender.  Organs are difficult to assess.  Extremities: Bilateral ankle edema.   Data Reviewed: I have personally reviewed following labs and imaging studies  CBC: Recent Labs  Lab 09/22/18 1014 09/25/18 0257  WBC 8.6 8.8  NEUTROABS 6.1  --   HGB 9.3* 9.0*  HCT 30.2* 28.1*  MCV 86.5 83.9  PLT 241 259   Basic Metabolic Panel: Recent Labs  Lab 09/23/18 0154 09/24/18 0322 09/25/18 0257 09/26/18 0228 09/27/18 0242 09/28/18 0216  NA 140 138 138 137 138 138  K 3.8 4.1 4.2 4.1 4.5 4.4  CL 104 101 100 101 102 99  CO2 26 26 26 27 23 28   GLUCOSE 129* 95 116* 131* 88 166*  BUN 15 19 20 22  25* 28*  CREATININE 1.94* 2.23* 2.24* 2.53* 2.63* 2.92*  CALCIUM 8.5* 8.7* 8.5* 8.0* 8.2* 8.3*  MG 1.8  --  1.8 1.8 2.0 2.3  PHOS  --   --   --   --   --  4.4   GFR: Estimated Creatinine Clearance: 24.9 mL/min (A) (by C-G formula based on SCr of 2.92 mg/dL (H)). Liver Function Tests: Recent Labs  Lab 09/22/18 1014 09/28/18 0216  AST 12*  --   ALT 15  --   ALKPHOS 106  --   BILITOT 0.7  --   PROT 6.8  --   ALBUMIN 2.9* 2.5*   No results for input(s): LIPASE, AMYLASE in the last 168 hours. No results for input(s): AMMONIA in the last 168 hours. Coagulation Profile: No results for input(s): INR, PROTIME in the last 168 hours. Cardiac Enzymes: No results for input(s): CKTOTAL, CKMB, CKMBINDEX, TROPONINI in the  last 168 hours. BNP (last 3 results) No results for input(s): PROBNP in the last 8760 hours. HbA1C: No results for input(s): HGBA1C in the last 72 hours. CBG: Recent Labs  Lab 09/27/18 1616 09/27/18 2027 09/28/18 0625 09/28/18 0726 09/28/18 1107  GLUCAP 189* 148* 182* 204* 98   Lipid Profile: No results for input(s): CHOL, HDL, LDLCALC, TRIG, CHOLHDL, LDLDIRECT in the last 72 hours. Thyroid Function Tests: No results for input(s): TSH, T4TOTAL, FREET4, T3FREE, THYROIDAB in the last 72 hours. Anemia Panel: No results for input(s): VITAMINB12, FOLATE, FERRITIN, TIBC, IRON, RETICCTPCT in the last 72 hours.    Radiology Studies: I have reviewed all of the imaging during this hospital visit personally     Scheduled Meds: . aspirin  324 mg Oral Once  . atorvastatin  20 mg Oral q1800  . carvedilol  12.5 mg Oral BID WC  . furosemide  40 mg Oral Daily  . heparin  5,000 Units Subcutaneous Q8H  . hydrALAZINE  75 mg Oral Q8H  . insulin aspart  0-5 Units Subcutaneous QHS  . insulin aspart  0-9 Units Subcutaneous Q4H  . insulin aspart  8 Units Subcutaneous TID WC  . isosorbide mononitrate  60 mg Oral Daily  . magnesium oxide  400 mg Oral TID  . sodium chloride flush  3 mL Intravenous Q12H   Continuous Infusions: . sodium chloride       LOS: 6 days    Bonnell Public, MD

## 2018-09-29 LAB — GLUCOSE, CAPILLARY
Glucose-Capillary: 148 mg/dL — ABNORMAL HIGH (ref 70–99)
Glucose-Capillary: 257 mg/dL — ABNORMAL HIGH (ref 70–99)
Glucose-Capillary: 173 mg/dL — ABNORMAL HIGH (ref 70–99)
Glucose-Capillary: 122 mg/dL — ABNORMAL HIGH (ref 70–99)
Glucose-Capillary: 113 mg/dL — ABNORMAL HIGH (ref 70–99)

## 2018-09-29 LAB — MAGNESIUM: Magnesium: 2.6 mg/dL — ABNORMAL HIGH (ref 1.7–2.4)

## 2018-09-29 LAB — BASIC METABOLIC PANEL
Anion gap: 10 (ref 5–15)
BUN: 31 mg/dL — ABNORMAL HIGH (ref 8–23)
CO2: 28 mmol/L (ref 22–32)
Calcium: 8.5 mg/dL — ABNORMAL LOW (ref 8.9–10.3)
Chloride: 100 mmol/L (ref 98–111)
Creatinine, Ser: 3.15 mg/dL — ABNORMAL HIGH (ref 0.44–1.00)
GFR calc Af Amer: 17 mL/min — ABNORMAL LOW (ref >60)
GFR calc non Af Amer: 15 mL/min — ABNORMAL LOW (ref >60)
Glucose, Bld: 120 mg/dL — ABNORMAL HIGH (ref 70–99)
Potassium: 4.6 mmol/L (ref 3.5–5.1)
Sodium: 138 mmol/L (ref 135–145)

## 2018-09-29 MED ORDER — HYDROCORTISONE 1 % EX CREA
TOPICAL_CREAM | Freq: Two times a day (BID) | CUTANEOUS | Status: DC
  Administered 2018-09-29 – 2018-09-30 (×2): via TOPICAL
  Filled 2018-09-29: qty 28

## 2018-09-29 NOTE — Progress Notes (Signed)
Physical Therapy Treatment Patient Details Name: Zoe Clarke MRN: [id number] DOB: 28-Jun-1950 Today's Date: 09/29/2018    History of Present Illness  Keltie Sarenity Ramaker is a 68 y.o. 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     PT Comments    Patient seen for mobility progression - initially declining mobility, however agreeable to ambulating. Up to Min A for mobility with standing from low arm-chair. Patient tendency to cross feet prior to standing with cueing for safety. Ambulating in hallway and back to room without LOB. PT to continue to follow.     Follow Up Recommendations  Home health PT;Supervision/Assistance - 24 hour     Equipment Recommendations       Recommendations for Other Services       Precautions / Restrictions Precautions Precautions: Fall Restrictions Weight Bearing Restrictions: No    Mobility  Bed Mobility Overal bed mobility: Needs Assistance Bed Mobility: Sit to Supine       Sit to supine: Min assist   General bed mobility comments: Min A for B LE management into bed;   Transfers Overall transfer level: Needs assistance Equipment used: Rolling walker (2 wheeled) Transfers: Sit to/from Stand Sit to Stand: Min guard;Min assist         General transfer comment: Min A from low armchair; cueing for hand and foot placement; patient tendencyto cross feet prior to standing  Ambulation/Gait Ambulation/Gait assistance: Min guard Gait Distance (Feet): 60 Feet Assistive device: Rolling walker (2 wheeled) Gait Pattern/deviations: Step-through pattern;Wide base of support;Trunk flexed Gait velocity: decreased   General Gait Details: slow  steady pace of gait; able to navigate obstacles and turn without LOB   Stairs             Wheelchair Mobility    Modified Rankin (Stroke Patients Only)       Balance Overall balance assessment: Needs assistance Sitting-balance support: No upper extremity supported;Feet supported Sitting balance-Leahy Scale: Fair     Standing balance support: Bilateral upper extremity supported;During functional activity Standing balance-Leahy Scale: Poor Standing balance comment: reliant on RW                            Cognition Arousal/Alertness: Awake/alert Behavior During Therapy: WFL for tasks assessed/performed Overall Cognitive Status: Within Functional Limits for tasks assessed                                 General Comments: appropriate conversation;       Exercises      General Comments        Pertinent Vitals/Pain Pain Assessment: Faces Faces Pain Scale: Hurts a little bit Pain Location: R abdomen Pain Descriptors / Indicators: Aching;Guarding Pain Intervention(s): Limited activity within patient's tolerance;Monitored during session;Repositioned    Home Living                      Prior Function            PT Goals (current goals can now be found in the care plan section) Acute Rehab PT Goals Patient Stated Goal: to get home  PT Goal Formulation: With patient Time For Goal Achievement: 10/07/18 Potential to Achieve Goals: Good Progress towards PT goals: Progressing toward goals    Frequency    Min 3X/week      PT Plan Current plan remains appropriate    Co-evaluation              AM-PAC PT "6 Clicks" Mobility   Outcome Measure  Help needed turning from your back to your side while in a flat bed without using bedrails?: A Little Help needed moving from lying on your back to sitting on the side of a flat bed without using bedrails?: A Little Help needed moving to and from a bed to a chair (including a  wheelchair)?: A Little Help needed standing up from a chair using your arms (e.g., wheelchair or bedside chair)?: A Little Help needed to walk in hospital room?: A Little Help needed climbing 3-5 steps with a railing? : A Lot 6 Click Score: 17    End of Session Equipment Utilized During Treatment: Gait belt Activity Tolerance: Patient tolerated treatment well Patient left: in bed;with call bell/phone within reach Nurse Communication: Mobility status PT Visit Diagnosis: Unsteadiness on feet (R26.81);Pain     Time: 1350-1415 PT Time Calculation (min) (ACUTE ONLY): 25 min  Charges:  $Gait Training: 8-22 mins $Therapeutic Activity: 8-22 mins                     Lanney Gins, PT, DPT Supplemental Physical Therapist 09/29/18 2:54 PM Pager: [phone] Office: [phone]

## 2018-09-29 NOTE — TOC Initial Note (Addendum)
Transition of Care [facility]) - Initial/Assessment Note    Patient Details  Name: Zoe Clarke MRN: [id number] Date of Birth: 07-25-1950  [facility] CM/SW Contact:    Gelene Mink, [place] Phone Number: 09/29/2018, 11:48 AM  Clinical Narrative:                  CSW met with the patient at bedside. The patient was alert and oriented. CSW introduced herself and explained her role. CSW asked if the patient was wanting to go to SNF, she declined. The patient was wanting to go home with home health. The patient has a rolling walker, wheel chair, shower chair, and a bedside commode. She stated that she needed an electric chair. The patient stated she was supposed to go to [facility] before she went in the hospital to get fitted for one. CSW stated that she would have to use her manual chair until she received her electric chair. The patient stated that she could not push herself around and neither could her husband. CSW asked how she would get around safely, the patient stated that she also uses her walker. CSW asked if she had support at home, she stated that her husband is able to assist her, she also has her adult children that are able to help. Patient stated she would like [facility] as preference.   CSW called Dan with [facility]. They are not able to take the patient due to being at capacity. CSW called Dorian Pod with [facility]. They are able to take the patient.    Patient will need home health orders.   Expected Discharge Plan: [place] Barriers to Discharge: Continued Medical Work up   Patient Goals and CMS Choice Patient states their goals for this hospitalization and ongoing recovery are:: Pt will return home with home health services. CMS Medicare.gov Compare Post Acute Care list provided to:: Patient Choice offered to / list presented to : Patient  Expected Discharge Plan and Services Expected Discharge Plan: [place] In-house  Referral: Clinical Social Work Discharge Planning Services: CM Consult Post Acute Care Choice: [place] arrangements for the past 2 months: [place]: Social Work [facility] Agency: [facility] Date [place]: 09/29/18 Time [place]: 1146 Representative spoke with at [place]: Dorian Pod  Prior Living Arrangements/Services Living arrangements for the past 2 months: [place] with:: Spouse Patient language and need for interpreter reviewed:: No Do you feel safe going back to the place where you live?: Yes      Need for Family Participation in Patient Care: No (Comment) Care giver support system in place?: Yes (comment) Current home services: DME Criminal Activity/Legal Involvement Pertinent to Current Situation/Hospitalization: No - Comment as needed  Activities of Daily Living Home Assistive Devices/Equipment: Eyeglasses, Gilford Rile (specify type) ADL Screening (condition at time of admission) Patient's cognitive ability adequate to safely complete daily activities?: Yes Is the patient deaf or have difficulty hearing?: No Does the patient have difficulty seeing, even when wearing glasses/contacts?: Yes Does the patient have difficulty concentrating, remembering, or making decisions?: No Patient able to express need for assistance with ADLs?: Yes Does the patient have difficulty dressing or bathing?: No Independently performs ADLs?: No Does the patient have difficulty walking or climbing stairs?:  Yes Weakness of Legs: Both Weakness of Arms/Hands: Left  Permission Sought/Granted Permission sought to share information with : Case Manager Permission granted to share information with : Yes, Verbal Permission Granted  Share Information with NAME: Gwyndolyn Saxon  Permission granted to share info w AGENCY: [facility]  Permission granted to share info w Relationship: Husband  Permission granted to share  info w Contact Information: [phone]  Emotional Assessment Appearance:: Appears stated age Attitude/Demeanor/Rapport: Engaged Affect (typically observed): Calm Orientation: : Oriented to Self, Oriented to Place, Oriented to  Time, Oriented to Situation Alcohol / Substance Use: Not Applicable Psych Involvement: No (comment)  Admission diagnosis:  Acute on chronic combined systolic and diastolic CHF, NYHA class 3 (HCC) [I50.43] Acute on chronic congestive heart failure, unspecified heart failure type [facility]) [I50.9] Patient Active Problem List   Diagnosis Date Noted  . 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 Thyroid nodule 09/22/2018  . Anemia, chronic renal failure, stage 3 (moderate) ([place]) 09/22/2018  . Sleep apnea 09/22/2018  . Precordial chest pain   . Symptomatic cholelithiasis 07/30/2018  . Acute on chronic combined systolic (congestive) and diastolic (congestive) heart failure ([place]) 07/29/2018  . Dyspnea 07/29/2018  . Tobacco abuse counseling 04/27/2018  . Acute pyelonephritis 04/10/2018  . Hypertensive urgency 04/10/2018  . Leukocytosis 09/10/2017  . Anemia 09/10/2017  . Knee pain, bilateral 01/13/2015  . Bilateral hip pain 11/23/2014  . Diabetes mellitus type 2, uncontrolled, with complications ([place]) 80/69/9967  . Intertrigo 10/25/2014  . GERD (gastroesophageal reflux disease) 10/25/2014  . Diabetic neuropathy ([place]) 10/25/2014  . Chronic low back pain 10/25/2014  . Current tobacco use 11/11/2013  . Stage 3 chronic kidney disease due to type 2 diabetes mellitus ([place]) 09/02/2013  . Essential (primary) hypertension 09/02/2013  . Gout 12/05/2012   PCP:  Antony Contras, MD Pharmacy:   [facility], [address] [place] [address] Phone: [phone] Fax: [phone]     Social Determinants of  Health (SDOH) Interventions    Readmission Risk Interventions No flowsheet data found.

## 2018-09-29 NOTE — Progress Notes (Signed)
OT Cancellation Note  Patient Details Name: Edmonia Gonser MRN: [id number] DOB: 1950-11-19   Cancelled Treatment:    Reason Eval/Treat Not Completed: Patient declined, no reason specified reports just returning back to bed and she is "too exhausted".  Will follow.   Delight Stare, OT Acute Rehabilitation Services Pager [phone] Office [phone]   Delight Stare 09/29/2018, 2:39 PM

## 2018-09-29 NOTE — Progress Notes (Signed)
Progress Note  Patient Name: Zoe Clarke Date of Encounter: 09/29/2018  Primary Cardiologist: Skeet Latch, MD   Subjective   No CP or dyspnea  Inpatient Medications    Scheduled Meds: . aspirin  324 mg Oral Once  . atorvastatin  20 mg Oral q1800  . carvedilol  12.5 mg Oral BID WC  . furosemide  40 mg Oral Daily  . heparin  5,000 Units Subcutaneous Q8H  . hydrALAZINE  75 mg Oral Q8H  . insulin aspart  0-5 Units Subcutaneous QHS  . insulin aspart  0-9 Units Subcutaneous Q4H  . insulin aspart  8 Units Subcutaneous TID WC  . isosorbide mononitrate  60 mg Oral Daily  . magnesium oxide  400 mg Oral TID  . sodium chloride flush  3 mL Intravenous Q12H   Continuous Infusions: . sodium chloride     PRN Meds: sodium chloride, acetaminophen, albuterol, diphenhydrAMINE, nitroGLYCERIN, ondansetron (ZOFRAN) IV, oxyCODONE-acetaminophen, sodium chloride flush, zolpidem   Vital Signs    Vitals:   09/29/18 0000 09/29/18 0400 09/29/18 0548 09/29/18 0612  BP:  (!) 169/85  (!) 169/85  Pulse: 76 83 95 86  Resp: 14 14 15    Temp:  98.2 F (36.8 C)    TempSrc:  Oral    SpO2: 100% 96% (!) 84%   Weight:  118.1 kg 111.9 kg   Height:        Intake/Output Summary (Last 24 hours) at 09/29/2018 0804 Last data filed at 09/28/2018 1700 Gross per 24 hour  Intake 720 ml  Output 800 ml  Net -80 ml   Last 3 Weights 09/29/2018 09/29/2018 09/28/2018  Weight (lbs) 246 lb 11.1 oz 260 lb 5.8 oz 246 lb 14.6 oz  Weight (kg) 111.9 kg 118.1 kg 112 kg      Telemetry    Sinus with PACs- Personally Reviewed  Physical Exam    GEN: No acute distress.   Neck: No JVD Cardiac: RRR, no murmurs, rubs, or gallops.  Respiratory: Clear to auscultation bilaterally. GI: Soft, nontender, non-distended  MS: No edema Neuro:  Nonfocal  Psych: Normal affect   Labs    High Sensitivity Troponin:   Recent Labs  Lab 09/22/18 1014 09/22/18 1224 09/22/18 1625  TROPONINIHS 27* 28* 28*        Chemistry Recent Labs  Lab 09/22/18 1014  09/27/18 0242 09/28/18 0216 09/29/18 0307  NA 143   < > 138 138 138  K 4.1   < > 4.5 4.4 4.6  CL 106   < > 102 99 100  CO2 27   < > 23 28 28   GLUCOSE 196*   < > 88 166* 120*  BUN 15   < > 25* 28* 31*  CREATININE 1.99*   < > 2.63* 2.92* 3.15*  CALCIUM 8.6*   < > 8.2* 8.3* 8.5*  PROT 6.8  --   --   --   --   ALBUMIN 2.9*  --   --  2.5*  --   AST 12*  --   --   --   --   ALT 15  --   --   --   --   ALKPHOS 106  --   --   --   --   BILITOT 0.7  --   --   --   --   GFRNONAA 26*   < > 18* 16* 15*  GFRAA 30*   < > 21* 19* 17*  ANIONGAP  10   < > 13 11 10    < > = values in this interval not displayed.     Hematology Recent Labs  Lab 09/22/18 1014 09/25/18 0257  WBC 8.6 8.8  RBC 3.49* 3.35*  3.35*  HGB 9.3* 9.0*  HCT 30.2* 28.1*  MCV 86.5 83.9  MCH 26.6 26.9  MCHC 30.8 32.0  RDW 15.0 14.6  PLT 241 232    BNP Recent Labs  Lab 09/22/18 1014 09/23/18 0206  BNP 1,721.0* 1,496.0*      Cardiac Studies   2D echo 04/28/18 IMPRESSIONS   1. The left ventricle has normal systolic function of 76-72%. The cavity size was normal. There is mildly increased left ventricular wall thickness. Echo evidence of impaired diastolic relaxation Elevated mean left atrial pressure. 2. The right ventricle has normal systolic function. The cavity was normal. There is no increase in right ventricular wall thickness. 3. Left atrial size was mildly dilated. 4. The mitral valve is normal in structure. There is mild thickening. 5. The tricuspid valve is normal in structure. 6. The aortic valve is normal in structure. Aortic valve regurgitation is mild by color flow Doppler. 7. The inferior vena cava was dilated in size with >50% respiratory variability.  Patient Profile     Zoe Millington Carteris a 68 y.o.femalewith a hx of chronic diastolicheart failure, hypertension, chronic kidney disease stage III, diabetes, asthma and GERDwho is  being seen for the evaluation of CHF.  Assessment & Plan    1 acute on chronic diastolic congestive heart failure-patient symptomatically has improved since admission. [id number].  Renal function is worse today.  Would hold Lasix and likely resume in 24 to 48 hours at 40 mg by mouth daily.  Will need close follow-up of renal function after discharge.  Patient instructed on fluid restriction and low-sodium diet.  2 hypertension-blood pressure with some improvement.  Coreg and hydralazine recently increased.  We will not make further changes today.  Follow blood pressure and advance regimen as needed.  3 chronic stage III kidney disease-renal function worse today.  Would hold Lasix for 24 to 48 hours as outlined above and then resume at 40 mg daily.  Will need close follow-up with nephrology following discharge.  4 thyroid nodule-follow-up primary care after discharge.  Patient can be discharged from a cardiac standpoint.  Continue present medications as listed in MAR.  Would resume Lasix 40 mg daily on Wednesday morning.  Check potassium and renal function Thursday morning.  Follow-up APP with transition of care appointment 1 week.  Follow-up Dr. Oval Linsey 3 months.  For questions or updates, please contact [place] Please consult [email or web address] for contact info under        Signed, Kirk Ruths, MD  09/29/2018, 8:04 AM

## 2018-09-29 NOTE — Care Management Important Message (Signed)
Important Message  Patient Details  Name: Zoe Clarke MRN: [id number] Date of Birth: 11-16-1950   Medicare Important Message Given:  Yes     Memory Argue 09/29/2018, 3:34 PM

## 2018-09-29 NOTE — Progress Notes (Signed)
PROGRESS NOTE  Zoe Clarke PNT:[id number] DOB: 1950-11-12 DOA: 09/22/2018 PCP: Antony Contras, MD   LOS: 7 days   Brief narrative: Patient is a 68 year old African-American female with past medical history significant for diastolic heart failure with EF of 60 to 65%, chronic kidney disease stage III, type 2 diabetes mellitus, and hypertension presented with dyspnea and worsening bilateral lower extremity edema.  Apparently, patient had increased her water intake and was not compliant with her diuretic and antihypertensive medications (clonidine).  On her initial physical examination blood pressure was 215/98, 188/141, pulse rate 78, respiratory rate 23, oxygen saturation 97%.  Her lungs had decreased breath sounds bilaterally, positive end expiratory wheezing and increased work of breathing, heart S1-S2 present, rhythmic, abdomen nondistended, protuberant, lower extremity with positive edema.  Sodium 143, potassium 4.1, chloride 106, bicarb 27, glucose 196, BUN 15, creatinine 1.9, BNP 1721.  Chest radiograph with cardiomegaly, bilateral lower lobes interstitial infiltrates and small bibasilar pleural effusions.  EKG 81 bpm, normal axis, normal intervals, sinus rhythm with normal conduction, positive PVC, poor R wave progression. Patient was admitted for further assessment and management of acute on chronic diastolic heart failure.   Subjective: Today patient complains of itching on the back.  Denies dyspnea, chest pain, palpitation.  She does have lower extremity edema and is off diuretic at this time due to worsening renal failure.  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  Creatinine trending up despite holding diuretics.  Will repeat BMP in a.m.  If there is down going trend or stable creatinine levels will consider for disposition home.  If not we will consider nephrology evaluation.  Acute on chronic diastolic heart failure: Continue strict intake and output charting, daily weights, monitor renal function closely since there is ongoing trend of creatinine.  Off diuretics at this time.  Cardiology recommends starting diuretics on Wednesday.  Will closely monitor for now.    Type 2 diabetes mellitus:  Continue with sliding scale insulin coverage.    Continue diabetic diet, Accu-Cheks.  Hypertension: On  hydralazine, isosorbide and carvedilol.  Continue to monitor closely.  Anemia of chronic renal disease: Stable, Continue to monitor.  VTE Prophylaxis: Heparin subcu  Code Status: Partial  Family Communication: None  Disposition Plan: Likely home in 1 to 2 days.  Home health services including face-to-face has been done.  Pending renal improvement prior to discharge.   Consultants:  Cardiology  Procedures:  None  Antibiotics: Anti-infectives (From admission, onward)   None      Objective: Vitals:   09/29/18 0810 09/29/18 1043  BP: 137/89 (!) 168/69  Pulse: 82 81  Resp: 13 12  Temp:  97.8 F (36.6 C)  SpO2: 93% 97%    Intake/Output Summary (Last 24 hours) at 09/29/2018 1336 Last data filed at 09/28/2018 1700 Gross per 24 hour  Intake 240 ml  Output 500 ml  Net -260 ml   [employer]  09/28/18 0500 09/29/18 0400 09/29/18 0548  Weight: 112 kg 118.1 kg 111.9 kg   Body mass index is 37.51 kg/m.   Physical Exam: GENERAL: Patient is alert awake and  oriented. Not in obvious distress.  Morbidly obese.   HENT: No scleral pallor or icterus. Pupils equally reactive to light. Oral mucosa is moist NECK: is supple, no palpable thyroid enlargement. CHEST: Clear to auscultation. No crackles or wheezes. Non tender on palpation. Diminished breath sounds bilaterally. CVS: S1 and S2 heard, no murmur. Regular rate and rhythm. No pericardial rub. ABDOMEN: Soft, non-tender, bowel sounds are present.  Obese. No palpable hepato-splenomegaly. EXTREMITIES: Trace lower extremity edema. CNS: Cranial nerves are intact. No focal motor or sensory deficits. SKIN: warm and dry without rashes.  Data Review: I have personally reviewed the following laboratory data and studies,  CBC: Recent Labs  Lab 09/25/18 0257  WBC 8.8  HGB 9.0*  HCT 28.1*  MCV 83.9  PLT 161   Basic Metabolic Panel: Recent Labs  Lab 09/25/18 0257 09/26/18 0228 09/27/18 0242 09/28/18 0216 09/29/18 0307  NA 138 137 138 138 138  K 4.2 4.1 4.5 4.4 4.6  CL 100 101 102 99 100  CO2 26 27 23 28 28   GLUCOSE 116* 131* 88 166* 120*  BUN 20 22 25* 28* 31*  CREATININE 2.24* 2.53* 2.63* 2.92* 3.15*  CALCIUM 8.5* 8.0* 8.2* 8.3* 8.5*  MG 1.8 1.8 2.0 2.3 2.6*  PHOS  --   --   --  4.4  --    Liver Function Tests: Recent Labs  Lab 09/28/18 0216  ALBUMIN 2.5*   No results for input(s): LIPASE, AMYLASE in the last 168 hours. No results for input(s): AMMONIA in the last 168 hours. Cardiac Enzymes: No results for input(s): CKTOTAL, CKMB, CKMBINDEX, TROPONINI in the last 168 hours. BNP (last 3 results) Recent Labs    07/29/18 1142 09/22/18 1014 09/23/18 0206  BNP 720.1* 1,721.0* 1,496.0*    ProBNP (last 3 results) No results for input(s): PROBNP in the last 8760 hours.  CBG: Recent Labs  Lab 09/28/18 1107 09/28/18 1623 09/28/18 2029 09/29/18 0615 09/29/18 1045  GLUCAP 98 90 112* 148* 257*   Recent Results (from the past 240 hour(s))  SARS Coronavirus 2 (CEPHEID -  Performed in [facility] hospital lab), Hosp Order     Status: None   Collection Time: 09/22/18 10:19 AM   Specimen: Nasopharyngeal Swab  Result Value Ref Range Status   SARS Coronavirus 2 NEGATIVE NEGATIVE Final    Comment: (NOTE) If result is NEGATIVE SARS-CoV-2 target nucleic acids are NOT DETECTED. The SARS-CoV-2 RNA is generally detectable in upper and lower  respiratory specimens during the acute phase of infection. The lowest  concentration of SARS-CoV-2 viral copies this assay can detect is 250  copies / mL. A negative result does not preclude SARS-CoV-2 infection  and should not be used as the sole basis for treatment or other  patient management decisions.  A negative result may occur with  improper specimen collection / handling, submission of specimen other  than nasopharyngeal swab, presence of viral mutation(s) within the  areas targeted by this assay, and inadequate number of viral copies  (<250 copies / mL). A negative result must be combined with clinical  observations, patient history, and epidemiological information. If result is POSITIVE SARS-CoV-2 target nucleic acids are DETECTED. The SARS-CoV-2 RNA is generally detectable in upper and lower  respiratory specimens dur ing the acute phase of infection.  Positive  results are indicative of active infection with SARS-CoV-2.  Clinical  correlation with patient history and other diagnostic information is  necessary to determine patient infection status.  Positive results do  not rule out bacterial infection or co-infection with other viruses. If result is PRESUMPTIVE POSTIVE SARS-CoV-2 nucleic acids MAY BE PRESENT.   A presumptive positive result was obtained on the submitted specimen  and confirmed on repeat testing.  While 2019 novel coronavirus  (SARS-CoV-2) nucleic acids may be present in the submitted sample  additional confirmatory testing may be necessary for epidemiological  and / or clinical management  purposes  to differentiate between  SARS-CoV-2 and other Sarbecovirus currently known to infect humans.  If clinically indicated additional testing with an alternate test  methodology [phone]) is advised. The SARS-CoV-2 RNA is generally  detectable in upper and lower respiratory sp ecimens during the acute  phase of infection. The expected result is Negative. Fact Sheet for Patients:  StrictlyIdeas.no Fact Sheet for Healthcare Providers: BankingDealers.co.za This test is not yet approved or cleared by the [place] FDA and has been authorized for detection and/or diagnosis of SARS-CoV-2 by FDA under an Emergency Use Authorization (EUA).  This EUA will remain in effect (meaning this test can be used) for the duration of the COVID-19 declaration under Section 564(b)(1) of the Act, 21 U.S.C. section 360bbb-3(b)(1), unless the authorization is terminated or revoked sooner. Performed at [facility], [place] [address] [place], [place] [address]   MRSA PCR Screening     Status: None   Collection Time: 09/22/18 10:18 PM   Specimen: Nasopharyngeal  Result Value Ref Range Status   MRSA by PCR NEGATIVE NEGATIVE Final    Comment:        The GeneXpert MRSA Assay (FDA approved for NASAL specimens only), is one component of a comprehensive MRSA colonization surveillance program. It is not intended to diagnose MRSA infection nor to guide or monitor treatment for MRSA infections. Performed at [facility], [place] [address] [place], [place] [address]      Studies: No results found.  Scheduled Meds: . aspirin  324 mg Oral Once  . atorvastatin  20 mg Oral q1800  . carvedilol  12.5 mg Oral BID WC  . heparin  5,000 Units Subcutaneous Q8H  . hydrALAZINE  75 mg Oral Q8H  . insulin aspart  0-5 Units Subcutaneous QHS  . insulin aspart  0-9 Units Subcutaneous Q4H  . insulin aspart  8 Units Subcutaneous TID WC  . isosorbide  mononitrate  60 mg Oral Daily  . magnesium oxide  400 mg Oral TID  . sodium chloride flush  3 mL Intravenous Q12H    Continuous Infusions: . sodium chloride       Flora Lipps, MD  [facility] 09/29/2018

## 2018-09-30 ENCOUNTER — Other Ambulatory Visit (HOSPITAL_COMMUNITY): Payer: PPO

## 2018-09-30 LAB — GLUCOSE, CAPILLARY
Glucose-Capillary: 131 mg/dL — ABNORMAL HIGH (ref 70–99)
Glucose-Capillary: 218 mg/dL — ABNORMAL HIGH (ref 70–99)
Glucose-Capillary: 143 mg/dL — ABNORMAL HIGH (ref 70–99)

## 2018-09-30 LAB — BASIC METABOLIC PANEL
Anion gap: 9 (ref 5–15)
BUN: 32 mg/dL — ABNORMAL HIGH (ref 8–23)
CO2: 27 mmol/L (ref 22–32)
Calcium: 8.6 mg/dL — ABNORMAL LOW (ref 8.9–10.3)
Chloride: 101 mmol/L (ref 98–111)
Creatinine, Ser: 2.99 mg/dL — ABNORMAL HIGH (ref 0.44–1.00)
GFR calc Af Amer: 18 mL/min — ABNORMAL LOW (ref >60)
GFR calc non Af Amer: 16 mL/min — ABNORMAL LOW (ref >60)
Glucose, Bld: 222 mg/dL — ABNORMAL HIGH (ref 70–99)
Potassium: 5 mmol/L (ref 3.5–5.1)
Sodium: 137 mmol/L (ref 135–145)

## 2018-09-30 MED ORDER — HYDRALAZINE HCL 25 MG PO TABS: 75.0000 mg | ORAL_TABLET | Freq: Three times a day (TID) | ORAL | 2 refills | Status: DC

## 2018-09-30 MED ORDER — HYDROCORTISONE 1 % EX CREA: TOPICAL_CREAM | Freq: Two times a day (BID) | CUTANEOUS | 0 refills | Status: AC

## 2018-09-30 MED ORDER — FUROSEMIDE 20 MG PO TABS: 40.0000 mg | ORAL_TABLET | Freq: Every day | ORAL | 0 refills | Status: DC

## 2018-09-30 MED ORDER — CARVEDILOL 12.5 MG PO TABS: 12.5000 mg | ORAL_TABLET | Freq: Two times a day (BID) | ORAL | 2 refills | Status: DC

## 2018-09-30 MED ORDER — POLYETHYLENE GLYCOL 3350 17 G PO PACK: 17.0000 g | PACK | Freq: Every day | ORAL | Status: DC

## 2018-09-30 MED ORDER — ISOSORBIDE MONONITRATE ER 60 MG PO TB24: 60.0000 mg | ORAL_TABLET | Freq: Every day | ORAL | 2 refills | Status: DC

## 2018-09-30 NOTE — Discharge Summary (Signed)
Physician Discharge Summary  Zoe Clarke YWV:[id number] DOB: 1950-05-29 DOA: 09/22/2018  PCP: Antony Contras, MD  Admit date: 09/22/2018   Discharge date: 09/30/2018  Admitted From: Home  Discharge disposition: Home with home health   Recommendations for Outpatient Follow-Up:   Follow up with your primary care provider as scheduled by you.  Please follow-up with your cardiology provider within 1 week.  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  Diet recommendation: Low sodium, heart healthy.  Carbohydrate-modified.   Wound care: None.  Code status: Partial   History of Present Illness:  Patient is a68 year oldAfrican-American female with past medical history significant fordiastolic heart failure with EF of 60 to 65%, chronic kidney disease stage III, type 2 diabetes mellitus, and hypertension presented with dyspnea andworseningbilateral lower extremity edema.Apparently, patienthad increasedher water intake and was not compliant with her diuretic and antihypertensive medications (clonidine). On her initial physical examination blood pressure was 215/98, 188/141, pulse rate 78, respiratory rate 23, oxygen saturation 97%. Her lungs had decreased breath sounds bilaterally, positive end expiratory wheezing and increased work of breathing, heart S1-S2 present, rhythmic, abdomen nondistended, protuberant,  lower extremity with positive edema. Sodium 143, potassium 4.1, chloride 106, bicarb 27, glucose 196, BUN 15, creatinine 1.9, BNP 1721. Chest radiograph with cardiomegaly, bilateral lower lobes interstitial infiltrates and small bibasilar pleural effusions. EKG 81 bpm, normal axis, normal intervals, sinus rhythm with normal conduction, positive PVC, poor R wave progression. Patient was admittedfor further assessment and management ofacute on chronic diastolic heart failure.  Hospital Course:  Following conditions were addressed during hospitalization,  Acute kidney injury on stage III chronic kidney disease.  Creatinine likely trended down after stopping the Lasix.  Cardiology recommends restarting diuretics on 10/01/2018.   Will have to follow-up with her primary care physician and cardiology after discharge to monitor renal function..  Acute on chronic diastolic heart failure: Improved with diuretics in the hospital.   Cardiology recommends starting diuretics on 10/01/2018 due to mild acute kidney injury.  Type 2 diabetes mellitus:  Resume home with the on discharge.  Hypertension: On  hydralazine, isosorbide and carvedilol.  Continue to monitor closely.  Will discontinue clonidine on discharge.  She has been started on hydralazine while in the hospital.  Anemia of chronic renal disease: Stable, Continue to monitor.   Code Status: Partial  Family Communication: None  Disposition Plan: , Patient is stable for disposition home with home health.  She will have to follow-up with her cardiology clinic within a week  (Dr Oval Linsey) and with her primary care physician as well in 2 to 3 weeks..    Home health services including face-to-face has been done.   Medical Consultants:   Cardiology  Subjective:   Today, patient feels better today.  Wishes to go home.  Denies any nausea, vomiting shortness of breath fever or chills.  Discharge Exam:   Vitals:   09/30/18 0247 09/30/18  0809  BP: (!) 148/99 Marland Kitchen)  159/78  Pulse: 69   Resp: 10 (!) 24  Temp: 98 F (36.7 C) 98.3 F (36.8 C)  SpO2: 100% 99%   Vitals:   09/29/18 2322 09/30/18 0247 09/30/18 0500 09/30/18 0809  BP: 140/76 (!) 148/99  (!) 159/78  Pulse: 75 69    Resp: 16 10  (!) 24  Temp: 98.1 F (36.7 C) 98 F (36.7 C)  98.3 F (36.8 C)  TempSrc: Oral Oral  Oral  SpO2: 98% 100%  99%  Weight:   112.5 kg   Height:        General exam: Appears calm and comfortable ,Not in distress, morbidly obese HEENT:PERRL,Oral mucosa moist Respiratory system: Bilateral equal air entry, normal vesicular breath sounds, no wheezes or crackles  Cardiovascular system: S1 & S2 heard, RRR.  Gastrointestinal system: Abdomen is nondistended, soft and nontender. No organomegaly or masses felt. Normal bowel sounds heard. Central nervous system: Alert and oriented. No focal neurological deficits. Extremities: Trace lower extremity edema, no clubbing ,no cyanosis, distal peripheral pulses palpable. Skin: No rashes, lesions or ulcers,no icterus ,no pallor MSK: Normal muscle bulk,tone ,power    Procedures:    None  The results of significant diagnostics from this hospitalization (including imaging, microbiology, ancillary and laboratory) are listed below for reference.     Diagnostic Studies:   Dg Chest 2 View  Result Date: 09/23/2018 CLINICAL DATA:  68 year old female with a history EXAM: CHEST - 2 VIEW COMPARISON:  09/22/2018 07/29/2018 FINDINGS: Cardiomediastinal silhouette enlarged. Fullness in the central vasculature. Interlobular septal thickening. Opacities at the bilateral lung bases with meniscus on the lateral view. No pneumothorax. IMPRESSION: CHF with small pleural effusions. Electronically Signed   By: Corrie Mckusick D.O.   On: 09/23/2018 08:04   Dg Chest Portable 1 View  Result Date: 09/22/2018 CLINICAL DATA:  Bilateral leg swelling. Left breast swelling. Shortness of breath. Dyspnea at rest. Congestive heart  failure. EXAM: PORTABLE CHEST 1 VIEW COMPARISON:  One-view chest x-ray 07/29/2018 FINDINGS: The heart is enlarged. Interstitial edema is present. Bibasilar airspace disease likely reflects atelectasis. Small effusions are suspected. A right-sided PICC line is in place. This terminates at the cavoatrial junction. IMPRESSION: 1. Cardiomegaly with mild edema and bilateral effusions compatible with congestive heart failure. 2. Bibasilar airspace opacities likely reflect atelectasis. Electronically Signed   By: San Morelle M.D.   On: 09/22/2018 10:27    Labs:   Basic Metabolic Panel: Recent Labs  Lab 09/25/18 0257 09/26/18 0228 09/27/18 0242 09/28/18 0216 09/29/18 0307 09/30/18 0830  NA 138 137 138 138 138 137  K 4.2 4.1 4.5 4.4 4.6 5.0  CL 100 101 102 99 100 101  CO2 26 27 23 28 28 27   GLUCOSE 116* 131* 88 166* 120* 222*  BUN 20 22 25* 28* 31* 32*  CREATININE 2.24* 2.53* 2.63* 2.92* 3.15* 2.99*  CALCIUM 8.5* 8.0* 8.2* 8.3* 8.5* 8.6*  MG 1.8 1.8 2.0 2.3 2.6*  --   PHOS  --   --   --  4.4  --   --    GFR Estimated Creatinine Clearance: 24.3 mL/min (A) (by C-G formula based on SCr of 2.99 mg/dL (H)). Liver Function Tests: Recent Labs  Lab 09/28/18 0216  ALBUMIN 2.5*   No results for input(s): LIPASE, AMYLASE in the last 168 hours. No results for input(s): AMMONIA in the last 168 hours. Coagulation profile No results for input(s): INR, PROTIME in the last 168 hours.  CBC: Recent Labs  Lab 09/25/18 0257  WBC 8.8  HGB 9.0*  HCT 28.1*  MCV 83.9  PLT 232   Cardiac Enzymes: No results for input(s): CKTOTAL, CKMB, CKMBINDEX, TROPONINI in the last 168 hours. BNP: Invalid input(s): POCBNP CBG: Recent Labs  Lab 09/29/18 1936 09/29/18 2311 09/30/18 0246 09/30/18 0832 09/30/18 1118  GLUCAP 122* 113* 131* 218* 143*   D-Dimer No results for input(s): DDIMER in the last 72 hours. Hgb A1c No results for input(s): HGBA1C in the last 72 hours. Lipid Profile No  results for input(s): CHOL, HDL, LDLCALC, TRIG, CHOLHDL, LDLDIRECT in the last 72 hours. Thyroid function studies No results for input(s): TSH, T4TOTAL, T3FREE, THYROIDAB in the last 72 hours.  Invalid input(s): FREET3 Anemia work up No results for input(s): VITAMINB12, FOLATE, FERRITIN, TIBC, IRON, RETICCTPCT in the last 72 hours. Microbiology Recent Results (from the past 240 hour(s))  SARS Coronavirus 2 (CEPHEID - Performed in [place] hospital lab), Hosp Order     Status: None   Collection Time: 09/22/18 10:19 AM   Specimen: Nasopharyngeal Swab  Result Value Ref Range Status   SARS Coronavirus 2 NEGATIVE NEGATIVE Final    Comment: (NOTE) If result is NEGATIVE SARS-CoV-2 target nucleic acids are NOT DETECTED. The SARS-CoV-2 RNA is generally detectable in upper and lower  respiratory specimens during the acute phase of infection. The lowest  concentration of SARS-CoV-2 viral copies this assay can detect is 250  copies / mL. A negative result does not preclude SARS-CoV-2 infection  and should not be used as the sole basis for treatment or other  patient management decisions.  A negative result may occur with  improper specimen collection / handling, submission of specimen other  than nasopharyngeal swab, presence of viral mutation(s) within the  areas targeted by this assay, and inadequate number of viral copies  (<250 copies / mL). A negative result must be combined with clinical  observations, patient history, and epidemiological information. If result is POSITIVE SARS-CoV-2 target nucleic acids are DETECTED. The SARS-CoV-2 RNA is generally detectable in upper and lower  respiratory specimens dur ing the acute phase of infection.  Positive  results are indicative of active infection with SARS-CoV-2.  Clinical  correlation with patient history and other diagnostic information is  necessary to determine patient infection status.  Positive results do  not rule out bacterial  infection or co-infection with other viruses. If result is PRESUMPTIVE POSTIVE SARS-CoV-2 nucleic acids MAY BE PRESENT.   A presumptive positive result was obtained on the submitted specimen  and confirmed on repeat testing.  While 2019 novel coronavirus  (SARS-CoV-2) nucleic acids may be present in the submitted sample  additional confirmatory testing may be necessary for epidemiological  and / or clinical management purposes  to differentiate between  SARS-CoV-2 and other Sarbecovirus currently known to infect humans.  If clinically indicated additional testing with an alternate test  methodology [phone]) is advised. The SARS-CoV-2 RNA is generally  detectable in upper and lower respiratory sp ecimens during the acute  phase of infection. The expected result is Negative. Fact Sheet for Patients:  StrictlyIdeas.no Fact Sheet for Healthcare Providers: BankingDealers.co.za This test is not yet approved or cleared by the [place] FDA and has been authorized for detection and/or diagnosis of SARS-CoV-2 by FDA under an Emergency Use Authorization (EUA).  This EUA will remain in effect (meaning this test can be used) for the duration of the COVID-19 declaration under Section 564(b)(1) of the Act, 21 U.S.C. section 360bbb-3(b)(1), unless the authorization is terminated  or revoked sooner. Performed at [facility], [place] [address] [place], [place] [address]   MRSA PCR Screening     Status: None   Collection Time: 09/22/18 10:18 PM   Specimen: Nasopharyngeal  Result Value Ref Range Status   MRSA by PCR NEGATIVE NEGATIVE Final    Comment:        The GeneXpert MRSA Assay (FDA approved for NASAL specimens only), is one component of a comprehensive MRSA colonization surveillance program. It is not intended to diagnose MRSA infection nor to guide or monitor treatment for MRSA infections. Performed at [facility], [place] [address] [place], [place] [address]      Discharge Instructions:   Discharge Instructions    (HEART FAILURE PATIENTS) Call MD:  Anytime you have any of the following symptoms: 1) 3 pound weight gain in 24 hours or 5 pounds in 1 week 2) shortness of breath, with or without a dry hacking cough 3) swelling in the hands, feet or stomach 4) if you have to sleep on extra pillows at night in order to breathe.   Complete by: As directed    Diet - low sodium heart healthy   Complete by: As directed    Diabetic diet   Discharge instructions   Complete by: As directed    Low-salt diet, daily weights, fluid restriction 1500 mL today.  Please follow-up with cardiology within 1 week to adjust your medications including fluid pill and to get  a blood work done.  Start taking your fluid pill (lasix) once a day from 10/01/2018.   Heart Failure patients record your daily weight using the same scale at the same time of day   Complete by: As directed    Increase activity slowly   Complete by: As directed    STOP any activity that causes chest pain, shortness of breath, dizziness, sweating, or exessive weakness   Complete by: As directed      Allergies as of 09/30/2018      Reactions   Aspirin Other (See Comments)   Feels funny    Allopurinol    Dizzy; hard to ambulate  Couldn't see; sweating   Amlodipine Other (See Comments)   headache   Lisinopril Swelling   Face swelling   Other Itching   Squash      Medication List    STOP taking these medications   cloNIDine 0.2 MG tablet Commonly known as: CATAPRES     TAKE these medications   albuterol 108 (90 Base) MCG/ACT inhaler Commonly known as: VENTOLIN HFA Inhale 2 puffs into the lungs every 6 (six) hours as needed for wheezing or shortness of breath.   atorvastatin 40 MG tablet Commonly known as: LIPITOR Take 40 mg by mouth daily.   carvedilol 12.5 MG tablet Commonly known as: COREG Take 1 tablet (12.5 mg total) by mouth 2 (two) times  daily with a meal. What changed:   medication strength  how much to take   cyclobenzaprine 5 MG tablet Commonly known as: FLEXERIL Take 5 mg by mouth at bedtime.   ergocalciferol 1.25 MG (50000 UT) capsule Commonly known as: VITAMIN D2 Take 50,000 Units by mouth once a week.   fexofenadine 180 MG tablet Commonly known as: ALLEGRA Take 180 mg by mouth daily.   furosemide 20 MG tablet Commonly known as: Lasix Take 2 tablets (40 mg total) by mouth daily. Start taking on: October 01, 2018 What changed: how much to take  gabapentin 300 MG capsule Commonly known as: NEURONTIN Take 300 mg by mouth 3 (three) times daily.   glipiZIDE 10 MG 24 hr tablet Commonly known as: GLUCOTROL XL Take 10 mg by mouth 2 (two) times a day. What changed: Another medication with the same name was removed. Continue taking this medication, and follow the directions you see here.   hydrALAZINE 25 MG tablet Commonly known as: APRESOLINE Take 3 tablets (75 mg total) by mouth 3 (three) times daily.   hydrocortisone cream 1 % Apply topically 2 (two) times daily. Areas of itch   isosorbide mononitrate 60 MG 24 hr tablet Commonly known as: IMDUR Take 1 tablet (60 mg total) by mouth daily. Start taking on: October 01, 2018   Linzess 145 MCG Caps capsule Generic drug: linaclotide Take 145 mcg by mouth as needed (constipation).   oxyCODONE-acetaminophen 5-325 MG tablet Commonly known as: PERCOCET/ROXICET Take 1 tablet by mouth every 6 (six) hours as needed for moderate pain or severe pain.   pantoprazole 40 MG tablet Commonly known as: PROTONIX Take 1 tablet (40 mg total) by mouth daily.   polyethylene glycol 17 g packet Commonly known as: MIRALAX / GLYCOLAX Take 17 g by mouth daily as needed for mild constipation.   triamcinolone cream 0.1 % Commonly known as: KENALOG Apply 1 application topically daily as needed (itching).      Follow-up Information    Antony Contras, MD. Schedule an  appointment as soon as possible for a visit in 3 week(s).   Specialty: Family Medicine Why: Schedule follow-up appointment with Dr. Antony Contras in 2 to 3 weeks diabetes type 2 uncontrolled with complication, CHF Contact information: [place] [id number] [phone]        Skeet Latch, MD Follow up.   Specialty: Cardiology Why: Schedule follow-up in 2 weeks with Dr. Skeet Latch acute on chronic diastolic CHF Contact information: [address] [place] [address] [phone]           Time coordinating discharge: 39 minutes  Signed:  Moon Budde  [facility] 09/30/2018, 11:39 AM

## 2018-09-30 NOTE — TOC Transition Note (Signed)
Transition of Care [facility]) - CM/SW Discharge Note   Patient Details  Name: Zoe Clarke MRN: [id number] Date of Birth: 03/26/1950  [facility] CM/SW Contact:  Candie Chroman, LCSW Phone Number: 09/30/2018, 1:33 PM   Clinical Narrative:  Patient has orders to discharge home today. [employer] representative is aware. Patient has home health orders for PT, OT, RN, and aide. No further concerns. CSW signing off.   Final next level of care: [place] Barriers to Discharge: Barriers Resolved   Patient Goals and CMS Choice Patient states their goals for this hospitalization and ongoing recovery are:: Pt will return home with home health services. CMS Medicare.gov Compare Post Acute Care list provided to:: Patient Choice offered to / list presented to : Patient  Discharge Placement                    Patient and family notified of of transfer: 09/30/18  Discharge Plan and Services In-house Referral: Clinical Social Work Discharge Planning Services: CM Consult Post Acute Care Choice: Home Health                    HH Arranged: RN, PT, OT, Nurse's Aide [place] Agency: [facility] Date [facility] Agency Contacted: 09/30/18 Time [place]: 0479 Representative spoke with at [place]: [place] (SDOH) Interventions     Readmission Risk Interventions Readmission Risk Prevention Plan 09/30/2018  [place] or Home Care Consult Complete  Palliative Care Screening Not Applicable  Some recent data might be hidden

## 2018-09-30 NOTE — Progress Notes (Signed)
Discharge instructions reviewed, questions answered.  Family called for ride.  Patient via wheelchair to son's waiting care at the lobby door

## 2018-10-03 ENCOUNTER — Encounter (HOSPITAL_BASED_OUTPATIENT_CLINIC_OR_DEPARTMENT_OTHER): Payer: PPO

## 2018-10-06 DIAGNOSIS — E1169 Type 2 diabetes mellitus with other specified complication: Secondary | ICD-10-CM | POA: Diagnosis not present

## 2018-10-06 DIAGNOSIS — I5032 Chronic diastolic (congestive) heart failure: Secondary | ICD-10-CM | POA: Diagnosis not present

## 2018-10-06 DIAGNOSIS — K219 Gastro-esophageal reflux disease without esophagitis: Secondary | ICD-10-CM | POA: Diagnosis not present

## 2018-10-06 DIAGNOSIS — I10 Essential (primary) hypertension: Secondary | ICD-10-CM | POA: Diagnosis not present

## 2018-10-06 DIAGNOSIS — R5381 Other malaise: Secondary | ICD-10-CM | POA: Diagnosis not present

## 2018-10-06 DIAGNOSIS — N183 Chronic kidney disease, stage 3 (moderate): Secondary | ICD-10-CM | POA: Diagnosis not present

## 2018-10-06 DIAGNOSIS — E7800 Pure hypercholesterolemia, unspecified: Secondary | ICD-10-CM | POA: Diagnosis not present

## 2018-10-08 ENCOUNTER — Telehealth: Payer: Self-pay

## 2018-10-08 NOTE — Telephone Encounter (Signed)
   [facility] Pre-operative Risk Assessment    Request for surgical clearance:  1. What type of surgery is being performed? Laparoscopic Cholecystectomy   2. When is this surgery scheduled? TBD   3. What type of clearance is required (medical clearance vs. Pharmacy clearance to hold med vs. Both)? Medical  4. Are there any medications that need to be held prior to surgery and how long? Not specified   5. Practice name and name of physician performing surgery? [facility]   6. What is your office phone number [phone]    7.   What is your office fax number [phone] Attn: Andreas Blower, CMP  8.   Anesthesia type (None, local, MAC, general) ? General   Zoe Clarke 10/08/2018, 7:51 AM  _________________________________________________________________   (provider comments below)

## 2018-10-08 NOTE — Telephone Encounter (Signed)
1st attempt: to reach pt to schedule appt for surgical clearance. LVM

## 2018-10-08 NOTE — Telephone Encounter (Signed)
   Primary [place], MD  Chart reviewed as part of pre-operative protocol coverage. Because of Corbyn Glen Pietsch's past medical history and time since last visit, he/she will require a follow-up visit in order to better assess preoperative cardiovascular risk.  Pre-op covering staff: - Please schedule appointment and call patient to inform them. - Please contact requesting surgeon's office via preferred method (i.e, phone, fax) to inform them of need for appointment prior to surgery.  If applicable, this message will also be routed to pharmacy pool and/or primary cardiologist for input on holding anticoagulant/antiplatelet agent as requested below so that this information is available at time of patient's appointment.   Kathyrn Drown, NP  10/08/2018, 8:05 AM

## 2018-10-09 NOTE — Telephone Encounter (Signed)
Follow up    Patient returned call. I did schedule patient for appt on 8/6 with Kerin Ransom for surgical clearance.

## 2018-10-13 ENCOUNTER — Encounter: Payer: Self-pay | Admitting: Gastroenterology

## 2018-10-23 ENCOUNTER — Other Ambulatory Visit: Payer: Self-pay

## 2018-10-23 ENCOUNTER — Encounter: Payer: Self-pay | Admitting: General Practice

## 2018-10-23 ENCOUNTER — Ambulatory Visit: Payer: PPO | Admitting: General Practice

## 2018-10-23 VITALS — BP 228/94 | HR 98 | Ht 68.0 in | Wt 250.0 lb

## 2018-10-23 DIAGNOSIS — I10 Essential (primary) hypertension: Secondary | ICD-10-CM

## 2018-10-23 DIAGNOSIS — E7800 Pure hypercholesterolemia, unspecified: Secondary | ICD-10-CM

## 2018-10-23 DIAGNOSIS — Z01818 Encounter for other preprocedural examination: Secondary | ICD-10-CM

## 2018-10-23 DIAGNOSIS — I5032 Chronic diastolic (congestive) heart failure: Secondary | ICD-10-CM | POA: Diagnosis not present

## 2018-10-23 NOTE — Progress Notes (Addendum)
Cardiology Clinic Note   Patient Name: Zoe Clarke Date of Encounter: 10/23/2018  Primary Care Provider:  Antony Contras, MD Primary Cardiologist:  Skeet Latch, MD  Patient Profile    Zoe Clarke 68 year old female presents today for preoperative evaluation.  Past Medical History    Past Medical History:  Diagnosis Date  . Allergy   . Arthritis   . Asthma    weazing at night uses inhalers  . Bronchitis    history of  . Chronic diastolic CHF (congestive heart failure) ([place]) 04/2018  . CKD (chronic kidney disease), stage III ([place])   . Diabetes mellitus without complication ([place])   . Gastric ulcer   . GERD (gastroesophageal reflux disease)   . History of hiatal hernia   . Hypertension   . Osteoporosis   . Sleep apnea 09/22/2018   Past Surgical History:  Procedure Laterality Date  . DILATION AND CURETTAGE OF UTERUS    . EYE SURGERY Left    removed cataract w/ lens  . TUBAL LIGATION      Allergies  Allergies  Allergen Reactions  . Aspirin Other (See Comments)    Feels funny   . Allopurinol     Dizzy; hard to ambulate  Couldn't see; sweating  . Amlodipine Other (See Comments)    headache  . Lisinopril Swelling    Face swelling  . Other Itching    Squash    History of Present Illness    Zoe Clarke was last seen by Dr. Stanford Breed in the hospital setting at discharge on 09/29/2018 for acute on chronic diastolic heart failure.  She had diuresed over 3 L, she was instructed on fluid restriction and low-sodium diet.  Her Coreg and hydralazine were increased.  Her renal function was somewhat worse and holding her Lasix for 24 to 48 hours was recommended with close renal monitoring.  And she was considered to be ready for discharge from a cardiovascular standpoint.   Her PMH also includes hypertensive urgency, acute on chronic combined systolic and diastolic heart failure, sleep apnea, GERD, diabetes mellitus type 2, chronic kidney disease stage III, thyroid  nodule, diabetic neuropathy, acute pyelonephritis, chronic low back pain, bilateral hip pain, bilateral knee pain, gout, anemia, dyspnea, and precordial chest pain.   She presents to the clinic today for preoperative cardiac evaluation.  She states she feels well and needs cardiac clearance for her gallbladder surgery.  She did not take any of her morning blood pressure medications.  She states she did not understand that she needs to take 75 mg of hydralazine 3 times daily.  She also did not understand that she needs to take 40 mg of Lasix daily.  She does not have trouble obtaining her medications. She states she has been walking around her house and down her driveway in the mornings and in the evenings without difficulty for exercise.  She states she has been taking care of her husband who has just been diagnosed with prostate cancer.  Patient states she follows up with her primary care provider tomorrow and has implemented a heart healthy low sodium diet.   She denies chest pain, shortness of breath, fatigue, palpitations, melena, hematuria, hemoptysis, diaphoresis, weakness, presyncope, syncope, orthopnea, and PND.   Home Medications    Prior to Admission medications   Medication Sig Start Date End Date Taking? Authorizing Provider  albuterol (PROVENTIL HFA;VENTOLIN HFA) 108 (90 Base) MCG/ACT inhaler Inhale 2 puffs into the lungs every 6 (six) hours as needed  for wheezing or shortness of breath. 04/14/18   Bonnell Public, MD  atorvastatin (LIPITOR) 40 MG tablet Take 40 mg by mouth daily.    [provider]  carvedilol (COREG) 12.5 MG tablet Take 1 tablet (12.5 mg total) by mouth 2 (two) times daily with a meal. 09/30/18   Pokhrel, Laxman, MD  cyclobenzaprine (FLEXERIL) 5 MG tablet Take 5 mg by mouth at bedtime.    [provider]  ergocalciferol (VITAMIN D2) 1.25 MG (50000 UT) capsule Take 50,000 Units by mouth once a week.    [provider]  fexofenadine (ALLEGRA)  180 MG tablet Take 180 mg by mouth daily.     [provider]  furosemide (LASIX) 20 MG tablet Take 2 tablets (40 mg total) by mouth daily. 10/01/18   Pokhrel, Corrie Mckusick, MD  gabapentin (NEURONTIN) 300 MG capsule Take 300 mg by mouth 3 (three) times daily.     [provider]  glipiZIDE (GLUCOTROL XL) 10 MG 24 hr tablet Take 10 mg by mouth 2 (two) times a day.    [provider]  hydrALAZINE (APRESOLINE) 25 MG tablet Take 3 tablets (75 mg total) by mouth 3 (three) times daily. 09/30/18 10/30/18  Pokhrel, Corrie Mckusick, MD  hydrocortisone cream 1 % Apply topically 2 (two) times daily. Areas of itch 09/30/18   Pokhrel, Corrie Mckusick, MD  isosorbide mononitrate (IMDUR) 60 MG 24 hr tablet Take 1 tablet (60 mg total) by mouth daily. 10/01/18 10/31/18  Pokhrel, Corrie Mckusick, MD  linaclotide (LINZESS) 145 MCG CAPS capsule Take 145 mcg by mouth as needed (constipation).    [provider]  oxyCODONE-acetaminophen (PERCOCET/ROXICET) 5-325 MG tablet Take 1 tablet by mouth every 6 (six) hours as needed for moderate pain or severe pain.     [provider]  pantoprazole (PROTONIX) 40 MG tablet Take 1 tablet (40 mg total) by mouth daily. 07/14/18   Skeet Latch, MD  polyethylene glycol [facility] / [name] packet Take 17 g by mouth daily as needed for mild constipation. Patient not taking: Reported on 09/10/2018 04/14/18   Dana Allan I, MD  triamcinolone cream (KENALOG) 0.1 % Apply 1 application topically daily as needed (itching).    [provider]    Family History    Family History  Problem Relation Age of Onset  . Arthritis Mother   . Hypertension Mother   . Heart failure Mother   . Emphysema Mother   . Arthritis Maternal Grandmother   . Hypertension Maternal Grandmother   . Stroke Maternal Grandfather   . Cancer Sister        unknown type cancer  . Heart failure Sister   . Lung disease Sister   . Heart failure Sister   . Colon cancer Neg Hx   . Esophageal  cancer Neg Hx   . Stomach cancer Neg Hx   . Rectal cancer Neg Hx    She indicated that her mother is deceased. She indicated that her father is deceased. She indicated that her maternal grandmother is deceased. She indicated that her maternal grandfather is deceased. She indicated that her paternal grandmother is deceased. She indicated that her paternal grandfather is deceased. She indicated that the status of her neg hx is unknown.  Social History    Social History   Socioeconomic History  . Marital status: Married    Spouse name: Not on file  . Number of children: 2  . Years of education: 54  . Highest education level: Not on file  Occupational History  . Occupation: Housewife  Social Needs  . Financial resource strain: Not on file  . Food insecurity    Worry: Not on file    Inability: Not on file  . Transportation needs    Medical: Not on file    Non-medical: Not on file  Tobacco Use  . Smoking status: Current Every Day Smoker    Packs/day: 0.50    Years: 39.00    Pack years: 19.50    Types: Cigarettes  . Smokeless tobacco: Never Used  . Tobacco comment: 1 pack last 3 days  Substance and Sexual Activity  . Alcohol use: No    Alcohol/week: 0.0 standard drinks  . Drug use: Not Currently    Types: Marijuana  . Sexual activity: Not on file  Lifestyle  . Physical activity    Days per week: Not on file    Minutes per session: Not on file  . Stress: Not on file  Relationships  . Social Herbalist on phone: Not on file    Gets together: Not on file    Attends religious service: Not on file    Active member of club or organization: Not on file    Attends meetings of clubs or organizations: Not on file    Relationship status: Not on file  . Intimate partner violence    Fear of current or ex partner: Not on file    Emotionally abused: Not on file    Physically abused: Not on file    Forced sexual activity: Not on file  Other Topics Concern  . Not on file   Social History Narrative   Fun: Race video car games - PS2/3, paint by number, board and card games, puzzle books   Feels safe at home and denies abuse   Disabled - does not work from chronic pain   Children - 1 living children, 3 step children   Grandchildren - 5   Married - lives with husband   Uses [employer] transportation     Review of Systems    General:  No chills, fever, night sweats or weight changes.  Cardiovascular:  No chest pain, dyspnea on exertion, edema, orthopnea, palpitations, paroxysmal nocturnal dyspnea. Dermatological: No rash, lesions/masses Respiratory: No cough, dyspnea Urologic: No hematuria, dysuria Abdominal:   No nausea, vomiting, diarrhea, bright red blood per rectum, melena, or hematemesis Neurologic:  No visual changes, wkns, changes in mental status. All other systems reviewed and are otherwise negative except as noted above.  Physical Exam    VS:  BP (!) 228/94   Pulse 98   Ht 5\' 8"  (1.727 m)   Wt 250 lb (113.4 kg)   SpO2 100%   BMI 38.01 kg/m  , BMI Body mass index is 38.01 kg/m. GEN: Well nourished, well developed, in no acute distress. HEENT: normal. Neck: Supple, no JVD, carotid bruits, or masses. Cardiac: RRR, no murmurs, rubs, or gallops. No clubbing, cyanosis, ++2 pitting bilateral LE edema.  Radials/DP/PT 2+ and equal bilaterally.  Respiratory:  Respirations regular and unlabored, clear to auscultation bilaterally. GI: Soft, nontender, nondistended, BS + x 4. MS: no deformity or atrophy. Skin: warm and dry, no rash. Neuro:  Strength and sensation are intact. Psych: Normal affect.  Accessory Clinical Findings    ECG personally reviewed by me today-sinus rhythm with occasional preventricular complexes 90 bpm- No acute changes  EKG 09/22/2018: Sinus rhythm with pre-atrial contraction 67 bpm  EKG 07/30/2018: Sinus rhythm 84 bpm  Echocardiogram 04/28/2018: IMPRESSIONS    1. The left ventricle has normal systolic function of 25-95%.  The cavity size was normal. There is mildly increased left ventricular wall thickness. Echo evidence of impaired diastolic relaxation Elevated mean left atrial pressure.  2. The right ventricle has normal systolic function. The cavity was normal. There is no increase in right ventricular wall thickness.  3. Left atrial size was mildly dilated.  4. The mitral valve is normal in structure. There is mild thickening.  5. The tricuspid valve is normal in structure.  6. The aortic valve is normal in structure. Aortic valve regurgitation is mild by color flow Doppler.  7. The inferior vena cava was dilated in size with >50% respiratory variability.  Assessment & Plan   1.  Preoperative evaluation  Chart reviewed as part of pre-operative protocol coverage. Given past medical history and time since last visit, based on ACC/AHA guidelines, Zoe Clarke would not be at acceptable risk for the planned procedure without better blood pressure control.  Blood pressure in office today 228/94 patient states she has not taken her morning blood pressure medication and did not understand her hydralazine increased dosing or her Lasix dosing regimen. Reinforced that she needs to take hydralazine 75 mg 3 times daily Reinforced that she needs to take 40 mg of Lasix daily Continue carvedilol 12.5 mg twice daily Heart healthy low-sodium diet Increase physical activity as tolerated Repeat evaluation in 2 weeks for preoperative evaluation   2.  Chronic diastolic heart failure- echocardiogram 04/28/2018 LVEF 60 to 65%, impaired diastolic relaxation.  Left atrium mildly dilated, mitral valve mild thickening, tricuspid valve normal, aortic valve regurgitation mild.  EKG today sinus rhythm with PVCs 90 bpm Continue furosemide 40 mg daily Continue low-sodium heart healthy diet  3.  Essential hypertension Continue carvedilol 12.5 mg tablet twice daily Continue hydralazine 75 mg 3 times daily Low-sodium heart healthy  diet Increase physical activity as tolerated  4.  Hyperlipidemia-09/25/2018: Cholesterol 124; HDL 46; LDL Cholesterol 55; Triglycerides 115; VLDL 23 Continue atorvastatin 40 mg tablet daily Followed by PCP   Disposition: Follow-up with APP on 11/07/2018     Deberah Pelton, NP-C 10/23/2018, 11:51 AM

## 2018-10-23 NOTE — Patient Instructions (Signed)
Medication Instructions:  Your physician recommends that you continue on your current medications as directed. Please refer to the Current Medication list given to you today. If you need a refill on your cardiac medications before your next appointment, please call your pharmacy.   Lab work: None  If you have labs (blood work) drawn today and your tests are completely normal, you will receive your results only by: Marland Kitchen MyChart Message (if you have MyChart) OR . A paper copy in the mail If you have any lab test that is abnormal or we need to change your treatment, we will call you to review the results.  Testing/Procedures: None   Follow-Up: At [facility], you and your health needs are our priority.  As part of our continuing mission to provide you with exceptional heart care, we have created designated Provider Care Teams.  These Care Teams include your primary Cardiologist (physician) and Advanced Practice Providers (APPs -  Physician Assistants and Nurse Practitioners) who all work together to provide you with the care you need, when you need it.  . Your physician recommends that you schedule a follow-up appointment in: 2 weeks with Almyra Deforest, PA-C  Any Other Special Instructions Will Be Listed Below (If Applicable). Cardiac Clearance will be determined at your next visit if your blood pressure is under control

## 2018-10-24 ENCOUNTER — Other Ambulatory Visit (INDEPENDENT_AMBULATORY_CARE_PROVIDER_SITE_OTHER): Payer: PPO

## 2018-10-24 DIAGNOSIS — R109 Unspecified abdominal pain: Secondary | ICD-10-CM | POA: Diagnosis not present

## 2018-10-24 DIAGNOSIS — E7800 Pure hypercholesterolemia, unspecified: Secondary | ICD-10-CM

## 2018-10-24 DIAGNOSIS — E1169 Type 2 diabetes mellitus with other specified complication: Secondary | ICD-10-CM | POA: Diagnosis not present

## 2018-10-24 DIAGNOSIS — I10 Essential (primary) hypertension: Secondary | ICD-10-CM | POA: Diagnosis not present

## 2018-10-24 DIAGNOSIS — I5033 Acute on chronic diastolic (congestive) heart failure: Secondary | ICD-10-CM

## 2018-10-24 DIAGNOSIS — I5032 Chronic diastolic (congestive) heart failure: Secondary | ICD-10-CM | POA: Diagnosis not present

## 2018-10-24 DIAGNOSIS — I160 Hypertensive urgency: Secondary | ICD-10-CM | POA: Diagnosis not present

## 2018-10-24 DIAGNOSIS — K828 Other specified diseases of gallbladder: Secondary | ICD-10-CM | POA: Diagnosis not present

## 2018-10-24 DIAGNOSIS — R0602 Shortness of breath: Secondary | ICD-10-CM | POA: Diagnosis not present

## 2018-10-24 DIAGNOSIS — Z01818 Encounter for other preprocedural examination: Secondary | ICD-10-CM | POA: Diagnosis not present

## 2018-10-24 DIAGNOSIS — N183 Chronic kidney disease, stage 3 (moderate): Secondary | ICD-10-CM | POA: Diagnosis not present

## 2018-10-24 DIAGNOSIS — D649 Anemia, unspecified: Secondary | ICD-10-CM | POA: Diagnosis not present

## 2018-10-24 DIAGNOSIS — G4734 Idiopathic sleep related nonobstructive alveolar hypoventilation: Secondary | ICD-10-CM | POA: Diagnosis not present

## 2018-10-24 DIAGNOSIS — K219 Gastro-esophageal reflux disease without esophagitis: Secondary | ICD-10-CM | POA: Diagnosis not present

## 2018-10-27 DIAGNOSIS — E1169 Type 2 diabetes mellitus with other specified complication: Secondary | ICD-10-CM | POA: Diagnosis not present

## 2018-10-27 DIAGNOSIS — N183 Chronic kidney disease, stage 3 (moderate): Secondary | ICD-10-CM | POA: Diagnosis not present

## 2018-10-27 DIAGNOSIS — D649 Anemia, unspecified: Secondary | ICD-10-CM | POA: Diagnosis not present

## 2018-10-30 DIAGNOSIS — I5021 Acute systolic (congestive) heart failure: Secondary | ICD-10-CM | POA: Diagnosis not present

## 2018-10-30 DIAGNOSIS — M25561 Pain in right knee: Secondary | ICD-10-CM | POA: Diagnosis not present

## 2018-10-30 DIAGNOSIS — Z9660 Presence of unspecified orthopedic joint implant: Secondary | ICD-10-CM | POA: Diagnosis not present

## 2018-10-30 DIAGNOSIS — Z96659 Presence of unspecified artificial knee joint: Secondary | ICD-10-CM | POA: Diagnosis not present

## 2018-11-06 ENCOUNTER — Other Ambulatory Visit: Payer: Self-pay

## 2018-11-06 ENCOUNTER — Emergency Department (HOSPITAL_COMMUNITY): Payer: PPO

## 2018-11-06 ENCOUNTER — Telehealth: Payer: Self-pay | Admitting: Cardiovascular Disease

## 2018-11-06 ENCOUNTER — Inpatient Hospital Stay (HOSPITAL_COMMUNITY)
Admission: EM | Admit: 2018-11-06 | Discharge: 2018-11-11 | DRG: 291 | Disposition: A | Discharge status: Home Health | Payer: PPO | Attending: Internal Medicine | Admitting: Internal Medicine

## 2018-11-06 ENCOUNTER — Encounter (HOSPITAL_COMMUNITY): Payer: Self-pay

## 2018-11-06 DIAGNOSIS — J9809 Other diseases of bronchus, not elsewhere classified: Secondary | ICD-10-CM | POA: Diagnosis not present

## 2018-11-06 DIAGNOSIS — I517 Cardiomegaly: Secondary | ICD-10-CM | POA: Diagnosis not present

## 2018-11-06 DIAGNOSIS — N183 Chronic kidney disease, stage 3 unspecified: Secondary | ICD-10-CM | POA: Diagnosis present

## 2018-11-06 DIAGNOSIS — N179 Acute kidney failure, unspecified: Secondary | ICD-10-CM | POA: Diagnosis present

## 2018-11-06 DIAGNOSIS — Z6839 Body mass index (BMI) 39.0-39.9, adult: Secondary | ICD-10-CM

## 2018-11-06 DIAGNOSIS — Z9119 Patient's noncompliance with other medical treatment and regimen: Secondary | ICD-10-CM | POA: Diagnosis not present

## 2018-11-06 DIAGNOSIS — K219 Gastro-esophageal reflux disease without esophagitis: Secondary | ICD-10-CM | POA: Diagnosis not present

## 2018-11-06 DIAGNOSIS — Z823 Family history of stroke: Secondary | ICD-10-CM

## 2018-11-06 DIAGNOSIS — K8020 Calculus of gallbladder without cholecystitis without obstruction: Secondary | ICD-10-CM | POA: Diagnosis present

## 2018-11-06 DIAGNOSIS — E1122 Type 2 diabetes mellitus with diabetic chronic kidney disease: Secondary | ICD-10-CM | POA: Diagnosis present

## 2018-11-06 DIAGNOSIS — E669 Obesity, unspecified: Secondary | ICD-10-CM | POA: Diagnosis not present

## 2018-11-06 DIAGNOSIS — R52 Pain, unspecified: Secondary | ICD-10-CM | POA: Diagnosis not present

## 2018-11-06 DIAGNOSIS — E11649 Type 2 diabetes mellitus with hypoglycemia without coma: Secondary | ICD-10-CM | POA: Diagnosis not present

## 2018-11-06 DIAGNOSIS — F17210 Nicotine dependence, cigarettes, uncomplicated: Secondary | ICD-10-CM | POA: Diagnosis not present

## 2018-11-06 DIAGNOSIS — Z20828 Contact with and (suspected) exposure to other viral communicable diseases: Secondary | ICD-10-CM | POA: Diagnosis present

## 2018-11-06 DIAGNOSIS — E1140 Type 2 diabetes mellitus with diabetic neuropathy, unspecified: Secondary | ICD-10-CM | POA: Diagnosis present

## 2018-11-06 DIAGNOSIS — I130 Hypertensive heart and chronic kidney disease with heart failure and stage 1 through stage 4 chronic kidney disease, or unspecified chronic kidney disease: Principal | ICD-10-CM | POA: Diagnosis present

## 2018-11-06 DIAGNOSIS — Z79899 Other long term (current) drug therapy: Secondary | ICD-10-CM

## 2018-11-06 DIAGNOSIS — E1165 Type 2 diabetes mellitus with hyperglycemia: Secondary | ICD-10-CM | POA: Diagnosis not present

## 2018-11-06 DIAGNOSIS — R600 Localized edema: Secondary | ICD-10-CM | POA: Diagnosis not present

## 2018-11-06 DIAGNOSIS — I5032 Chronic diastolic (congestive) heart failure: Secondary | ICD-10-CM

## 2018-11-06 DIAGNOSIS — IMO0002 Reserved for concepts with insufficient information to code with codable children: Secondary | ICD-10-CM

## 2018-11-06 DIAGNOSIS — R1084 Generalized abdominal pain: Secondary | ICD-10-CM | POA: Diagnosis not present

## 2018-11-06 DIAGNOSIS — I5043 Acute on chronic combined systolic (congestive) and diastolic (congestive) heart failure: Secondary | ICD-10-CM | POA: Diagnosis not present

## 2018-11-06 DIAGNOSIS — I10 Essential (primary) hypertension: Secondary | ICD-10-CM | POA: Diagnosis present

## 2018-11-06 DIAGNOSIS — Z825 Family history of asthma and other chronic lower respiratory diseases: Secondary | ICD-10-CM | POA: Diagnosis not present

## 2018-11-06 DIAGNOSIS — R05 Cough: Secondary | ICD-10-CM | POA: Diagnosis not present

## 2018-11-06 DIAGNOSIS — E1149 Type 2 diabetes mellitus with other diabetic neurological complication: Secondary | ICD-10-CM

## 2018-11-06 DIAGNOSIS — M810 Age-related osteoporosis without current pathological fracture: Secondary | ICD-10-CM | POA: Diagnosis not present

## 2018-11-06 DIAGNOSIS — Z8249 Family history of ischemic heart disease and other diseases of the circulatory system: Secondary | ICD-10-CM | POA: Diagnosis not present

## 2018-11-06 DIAGNOSIS — E118 Type 2 diabetes mellitus with unspecified complications: Secondary | ICD-10-CM

## 2018-11-06 DIAGNOSIS — N184 Chronic kidney disease, stage 4 (severe): Secondary | ICD-10-CM | POA: Diagnosis not present

## 2018-11-06 DIAGNOSIS — Z8261 Family history of arthritis: Secondary | ICD-10-CM

## 2018-11-06 DIAGNOSIS — R0902 Hypoxemia: Secondary | ICD-10-CM | POA: Diagnosis not present

## 2018-11-06 DIAGNOSIS — I110 Hypertensive heart disease with heart failure: Secondary | ICD-10-CM | POA: Diagnosis not present

## 2018-11-06 DIAGNOSIS — R609 Edema, unspecified: Secondary | ICD-10-CM | POA: Diagnosis not present

## 2018-11-06 DIAGNOSIS — L299 Pruritus, unspecified: Secondary | ICD-10-CM | POA: Diagnosis present

## 2018-11-06 DIAGNOSIS — I5033 Acute on chronic diastolic (congestive) heart failure: Secondary | ICD-10-CM | POA: Diagnosis not present

## 2018-11-06 DIAGNOSIS — D631 Anemia in chronic kidney disease: Secondary | ICD-10-CM | POA: Diagnosis present

## 2018-11-06 DIAGNOSIS — E785 Hyperlipidemia, unspecified: Secondary | ICD-10-CM | POA: Diagnosis present

## 2018-11-06 DIAGNOSIS — Z720 Tobacco use: Secondary | ICD-10-CM | POA: Diagnosis present

## 2018-11-06 DIAGNOSIS — D259 Leiomyoma of uterus, unspecified: Secondary | ICD-10-CM | POA: Diagnosis not present

## 2018-11-06 DIAGNOSIS — E119 Type 2 diabetes mellitus without complications: Secondary | ICD-10-CM | POA: Diagnosis present

## 2018-11-06 DIAGNOSIS — Z7984 Long term (current) use of oral hypoglycemic drugs: Secondary | ICD-10-CM

## 2018-11-06 DIAGNOSIS — G4733 Obstructive sleep apnea (adult) (pediatric): Secondary | ICD-10-CM | POA: Diagnosis not present

## 2018-11-06 LAB — CBC WITH DIFFERENTIAL/PLATELET
Abs Immature Granulocytes: 0.02 10*3/uL (ref 0.00–0.07)
Basophils Absolute: 0 10*3/uL (ref 0.0–0.1)
Basophils Relative: 0 %
Eosinophils Absolute: 0.2 10*3/uL (ref 0.0–0.5)
Eosinophils Relative: 3 %
HCT: 28 % — ABNORMAL LOW (ref 36.0–46.0)
Hemoglobin: 8.3 g/dL — ABNORMAL LOW (ref 12.0–15.0)
Immature Granulocytes: 0 %
Lymphocytes Relative: 26 %
Lymphs Abs: 1.7 10*3/uL (ref 0.7–4.0)
MCH: 26.3 pg (ref 26.0–34.0)
MCHC: 29.6 g/dL — ABNORMAL LOW (ref 30.0–36.0)
MCV: 88.9 fL (ref 80.0–100.0)
Monocytes Absolute: 0.5 10*3/uL (ref 0.1–1.0)
Monocytes Relative: 8 %
Neutro Abs: 4.2 10*3/uL (ref 1.7–7.7)
Neutrophils Relative %: 63 %
Platelets: 191 10*3/uL (ref 150–400)
RBC: 3.15 MIL/uL — ABNORMAL LOW (ref 3.87–5.11)
RDW: 15.4 % (ref 11.5–15.5)
WBC: 6.7 10*3/uL (ref 4.0–10.5)
nRBC: 0 % (ref 0.0–0.2)

## 2018-11-06 LAB — COMPREHENSIVE METABOLIC PANEL
ALT: 15 U/L (ref 0–44)
AST: 13 U/L — ABNORMAL LOW (ref 15–41)
Albumin: 2.9 g/dL — ABNORMAL LOW (ref 3.5–5.0)
Alkaline Phosphatase: 86 U/L (ref 38–126)
Anion gap: 7 (ref 5–15)
BUN: 20 mg/dL (ref 8–23)
CO2: 22 mmol/L (ref 22–32)
Calcium: 8.4 mg/dL — ABNORMAL LOW (ref 8.9–10.3)
Chloride: 112 mmol/L — ABNORMAL HIGH (ref 98–111)
Creatinine, Ser: 2.12 mg/dL — ABNORMAL HIGH (ref 0.44–1.00)
GFR calc Af Amer: 27 mL/min — ABNORMAL LOW (ref >60)
GFR calc non Af Amer: 24 mL/min — ABNORMAL LOW (ref >60)
Glucose, Bld: 117 mg/dL — ABNORMAL HIGH (ref 70–99)
Potassium: 4.5 mmol/L (ref 3.5–5.1)
Sodium: 141 mmol/L (ref 135–145)
Total Bilirubin: 0.5 mg/dL (ref 0.3–1.2)
Total Protein: 6.2 g/dL — ABNORMAL LOW (ref 6.5–8.1)

## 2018-11-06 LAB — GLUCOSE, CAPILLARY
Glucose-Capillary: 92 mg/dL (ref 70–99)
Glucose-Capillary: 143 mg/dL — ABNORMAL HIGH (ref 70–99)
Glucose-Capillary: 137 mg/dL — ABNORMAL HIGH (ref 70–99)

## 2018-11-06 LAB — SARS CORONAVIRUS 2 BY RT PCR (HOSPITAL ORDER, PERFORMED IN ~~LOC~~ HOSPITAL LAB): SARS Coronavirus 2: NEGATIVE

## 2018-11-06 LAB — BRAIN NATRIURETIC PEPTIDE: B Natriuretic Peptide: 1845.4 pg/mL — ABNORMAL HIGH (ref 0.0–100.0)

## 2018-11-06 MED ORDER — LINACLOTIDE 145 MCG PO CAPS: 145.0000 ug | ORAL_CAPSULE | ORAL | Status: DC | PRN

## 2018-11-06 MED ORDER — SODIUM CHLORIDE 0.9 % IV SOLN: 250.0000 mL | INTRAVENOUS | Status: DC | PRN

## 2018-11-06 MED ORDER — INSULIN ASPART 100 UNIT/ML ~~LOC~~ SOLN
0.0000 [IU] | Freq: Every day | SUBCUTANEOUS | Status: DC
  Administered 2018-11-10: 2 [IU] via SUBCUTANEOUS

## 2018-11-06 MED ORDER — INSULIN ASPART 100 UNIT/ML ~~LOC~~ SOLN
0.0000 [IU] | Freq: Three times a day (TID) | SUBCUTANEOUS | Status: DC
  Administered 2018-11-07: 1 [IU] via SUBCUTANEOUS

## 2018-11-06 MED ORDER — HYDRALAZINE HCL 20 MG/ML IJ SOLN
5.0000 mg | Freq: Four times a day (QID) | INTRAMUSCULAR | Status: DC | PRN
  Filled 2018-11-06 (×2): qty 1

## 2018-11-06 MED ORDER — PANTOPRAZOLE SODIUM 40 MG PO TBEC
40.0000 mg | DELAYED_RELEASE_TABLET | Freq: Every day | ORAL | Status: DC
  Administered 2018-11-06 – 2018-11-11 (×6): 40 mg via ORAL
  Filled 2018-11-06 (×6): qty 1

## 2018-11-06 MED ORDER — GLIPIZIDE ER 10 MG PO TB24
10.0000 mg | ORAL_TABLET | Freq: Every day | ORAL | Status: DC
  Administered 2018-11-07: 10 mg via ORAL
  Filled 2018-11-06 (×2): qty 1

## 2018-11-06 MED ORDER — SODIUM CHLORIDE 0.9% FLUSH: 3.0000 mL | INTRAVENOUS | Status: DC | PRN

## 2018-11-06 MED ORDER — LORATADINE 10 MG PO TABS
10.0000 mg | ORAL_TABLET | Freq: Every day | ORAL | Status: DC
  Administered 2018-11-06 – 2018-11-11 (×6): 10 mg via ORAL
  Filled 2018-11-06 (×6): qty 1

## 2018-11-06 MED ORDER — ONDANSETRON HCL 4 MG/2ML IJ SOLN: 4.0000 mg | Freq: Four times a day (QID) | INTRAMUSCULAR | Status: DC | PRN

## 2018-11-06 MED ORDER — OXYCODONE-ACETAMINOPHEN 5-325 MG PO TABS
1.0000 | ORAL_TABLET | Freq: Four times a day (QID) | ORAL | Status: DC | PRN
  Administered 2018-11-06 – 2018-11-11 (×11): 1 via ORAL
  Filled 2018-11-06 (×12): qty 1

## 2018-11-06 MED ORDER — CYCLOBENZAPRINE HCL 5 MG PO TABS
5.0000 mg | ORAL_TABLET | Freq: Every day | ORAL | Status: DC
  Administered 2018-11-06: 5 mg via ORAL
  Filled 2018-11-06: qty 1

## 2018-11-06 MED ORDER — CARVEDILOL 12.5 MG PO TABS
12.5000 mg | ORAL_TABLET | Freq: Two times a day (BID) | ORAL | Status: DC
  Administered 2018-11-06 – 2018-11-11 (×10): 12.5 mg via ORAL
  Filled 2018-11-06 (×10): qty 1

## 2018-11-06 MED ORDER — HEPARIN SODIUM (PORCINE) 5000 UNIT/ML IJ SOLN
5000.0000 [IU] | Freq: Three times a day (TID) | INTRAMUSCULAR | Status: DC
  Administered 2018-11-06 – 2018-11-11 (×14): 5000 [IU] via SUBCUTANEOUS
  Filled 2018-11-06 (×14): qty 1

## 2018-11-06 MED ORDER — HYDRALAZINE HCL 50 MG PO TABS
75.0000 mg | ORAL_TABLET | Freq: Three times a day (TID) | ORAL | Status: DC
  Administered 2018-11-06 – 2018-11-11 (×14): 75 mg via ORAL
  Filled 2018-11-06 (×15): qty 1

## 2018-11-06 MED ORDER — FUROSEMIDE 10 MG/ML IJ SOLN
40.0000 mg | Freq: Once | INTRAMUSCULAR | Status: AC
  Administered 2018-11-06: 40 mg via INTRAVENOUS
  Filled 2018-11-06: qty 4

## 2018-11-06 MED ORDER — ISOSORBIDE MONONITRATE ER 60 MG PO TB24
60.0000 mg | ORAL_TABLET | Freq: Every day | ORAL | Status: DC
  Administered 2018-11-06 – 2018-11-11 (×6): 60 mg via ORAL
  Filled 2018-11-06 (×6): qty 1

## 2018-11-06 MED ORDER — SODIUM CHLORIDE 0.9% FLUSH
3.0000 mL | Freq: Two times a day (BID) | INTRAVENOUS | Status: DC
  Administered 2018-11-06 – 2018-11-11 (×10): 3 mL via INTRAVENOUS

## 2018-11-06 MED ORDER — GABAPENTIN 300 MG PO CAPS
300.0000 mg | ORAL_CAPSULE | Freq: Three times a day (TID) | ORAL | Status: DC
  Administered 2018-11-06 – 2018-11-11 (×14): 300 mg via ORAL
  Filled 2018-11-06: qty 3
  Filled 2018-11-06 (×8): qty 1
  Filled 2018-11-06 (×2): qty 3
  Filled 2018-11-06 (×2): qty 1
  Filled 2018-11-06: qty 3

## 2018-11-06 MED ORDER — FUROSEMIDE 10 MG/ML IJ SOLN
60.0000 mg | Freq: Two times a day (BID) | INTRAMUSCULAR | Status: DC
  Administered 2018-11-06 – 2018-11-07 (×3): 60 mg via INTRAVENOUS
  Filled 2018-11-06 (×3): qty 6

## 2018-11-06 MED ORDER — ALBUTEROL SULFATE (2.5 MG/3ML) 0.083% IN NEBU: 3.0000 mL | INHALATION_SOLUTION | Freq: Four times a day (QID) | RESPIRATORY_TRACT | Status: DC | PRN

## 2018-11-06 MED ORDER — ACETAMINOPHEN 325 MG PO TABS
650.0000 mg | ORAL_TABLET | ORAL | Status: DC | PRN
  Administered 2018-11-06 – 2018-11-09 (×2): 650 mg via ORAL
  Filled 2018-11-06 (×3): qty 2

## 2018-11-06 MED ORDER — ATORVASTATIN CALCIUM 40 MG PO TABS
40.0000 mg | ORAL_TABLET | Freq: Every day | ORAL | Status: DC
  Administered 2018-11-06 – 2018-11-11 (×6): 40 mg via ORAL
  Filled 2018-11-06 (×6): qty 1

## 2018-11-06 NOTE — ED Notes (Signed)
Pt brought to the ER via EMS for abdominal/bilateral leg swelling. Pt stated she had a doctor appointment today for this issue, but that it progressed overnight. Pt is still currently a smoker.

## 2018-11-06 NOTE — ED Triage Notes (Signed)
Pt was brought to the ER via EMS. The pt stated she was having abdominal swelling and bilateral lower leg swelling that worsened overnight, so she called 9-1-1. The pt stated she also had a doctor appointment for this complaint. En route, EMS initiated a 20 gauge IV in the left hand, assessed vitals, and performed an EKG. The pt was hypertensive with a systolic of 809 but otherwise within defined limits.

## 2018-11-06 NOTE — H&P (Signed)
TRH H&P   Patient Demographics:    Zoe Clarke, is a 68 y.o. female  MRN: [id number]   DOB - 07-10-1950  Admit Date - 11/06/2018  Outpatient Primary MD for the patient is Antony Contras, MD  Referring MD/NP/PA: Dr Mel Almond  Outpatient Specialists: cardiology Dr Oval Linsey  Patient coming from: Home  Chief Complaint  Patient presents with  . abdominal edema  . Leg Swelling    bilateral      HPI:    Zoe Clarke  is a 68 y.o. female, with past medical history for chronic diastolic CHF, EF 60 to 14%, CKD stage III, type 2 diabetes, hypertension, still using tobacco, presents to ED secondary to complaints of worsening lower extremity edema extending all the way to her abdomen, as well ports some dyspnea, exertional and mild orthopnea as well, patient reports she has been compliant with no salt diet, her medications, but she has not been compliant with fluid restriction and still smoking, denies any chest pain, fever, chills, nausea, vomiting, dysuria or polyuria. - in ED there is no hypoxia, chest x-ray significant for mild cardiomegaly, saturating 100% on room air, but has significantly elevated proBNP at 1845, distended JVD, elevated creatinine at 2.1, which is better than her baseline which is around 3, I was called to admit.    Review of systems:    In addition to the HPI above,  No Fever-chills, she reports worsening lower extremity edema No Headache, No changes with Vision or hearing, No problems swallowing food or Liquids, No Chest pain, Cough, she reports some dyspnea No Abdominal pain, No Nausea or Vommitting, Bowel movements are regular, No Blood in stool or Urine, No dysuria, No new skin rashes or bruises, No new joints pains-aches,  No new weakness, tingling, numbness in any extremity, No recent weight gain or loss, No polyuria, polydypsia or polyphagia, No  significant Mental Stressors.  A full 10 point Review of Systems was done, except as stated above, all other Review of Systems were negative.   With Past History of the following :    Past Medical History:  Diagnosis Date  . Allergy   . Arthritis   . Asthma    weazing at night uses inhalers  . Bronchitis    history of  . Chronic diastolic CHF (congestive heart failure) ([place]) 04/2018  . CKD (chronic kidney disease), stage III ([place])   . Diabetes mellitus without complication ([place])   . Gastric ulcer   . GERD (gastroesophageal reflux disease)   . History of hiatal hernia   . Hypertension   . Osteoporosis   . Sleep apnea 09/22/2018      Past Surgical History:  Procedure Laterality Date  . DILATION AND CURETTAGE OF UTERUS    . EYE SURGERY Left    removed cataract w/ lens  . TUBAL LIGATION        Social History:  Social History   Tobacco Use  . Smoking status: Current Every Day Smoker    Packs/day: 0.50    Years: 39.00    Pack years: 19.50    Types: Cigarettes  . Smokeless tobacco: Never Used  . Tobacco comment: 1 pack last 3 days  Substance Use Topics  . Alcohol use: No    Alcohol/week: 0.0 standard drinks      Family History :     Family History  Problem Relation Age of Onset  . Arthritis Mother   . Hypertension Mother   . Heart failure Mother   . Emphysema Mother   . Arthritis Maternal Grandmother   . Hypertension Maternal Grandmother   . Stroke Maternal Grandfather   . Cancer Sister        unknown type cancer  . Heart failure Sister   . Lung disease Sister   . Heart failure Sister   . Colon cancer Neg Hx   . Esophageal cancer Neg Hx   . Stomach cancer Neg Hx   . Rectal cancer Neg Hx     Home Medications:   Prior to Admission medications   Medication Sig Start Date End Date Taking? Authorizing Provider  albuterol (PROVENTIL HFA;VENTOLIN HFA) 108 (90 Base) MCG/ACT inhaler Inhale 2 puffs into the lungs every 6 (six) hours as needed for  wheezing or shortness of breath. 04/14/18   Bonnell Public, MD  atorvastatin (LIPITOR) 40 MG tablet Take 40 mg by mouth daily.    [provider]  carvedilol (COREG) 12.5 MG tablet Take 1 tablet (12.5 mg total) by mouth 2 (two) times daily with a meal. 09/30/18   Pokhrel, Laxman, MD  cyclobenzaprine (FLEXERIL) 5 MG tablet Take 5 mg by mouth at bedtime.    [provider]  ergocalciferol (VITAMIN D2) 1.25 MG (50000 UT) capsule Take 50,000 Units by mouth once a week.    [provider]  fexofenadine (ALLEGRA) 180 MG tablet Take 180 mg by mouth daily.     [provider]  furosemide (LASIX) 20 MG tablet Take 2 tablets (40 mg total) by mouth daily. 10/01/18   Pokhrel, Corrie Mckusick, MD  gabapentin (NEURONTIN) 300 MG capsule Take 300 mg by mouth 3 (three) times daily.     [provider]  glipiZIDE (GLUCOTROL XL) 10 MG 24 hr tablet Take 10 mg by mouth 2 (two) times a day.    [provider]  hydrALAZINE (APRESOLINE) 25 MG tablet Take 3 tablets (75 mg total) by mouth 3 (three) times daily. 09/30/18 10/30/18  Pokhrel, Corrie Mckusick, MD  hydrocortisone cream 1 % Apply topically 2 (two) times daily. Areas of itch 09/30/18   Pokhrel, Corrie Mckusick, MD  isosorbide mononitrate (IMDUR) 60 MG 24 hr tablet Take 1 tablet (60 mg total) by mouth daily. 10/01/18 10/31/18  Pokhrel, Corrie Mckusick, MD  linaclotide (LINZESS) 145 MCG CAPS capsule Take 145 mcg by mouth as needed (constipation).    [provider]  oxyCODONE-acetaminophen (PERCOCET/ROXICET) 5-325 MG tablet Take 1 tablet by mouth every 6 (six) hours as needed for moderate pain or severe pain.     [provider]  pantoprazole (PROTONIX) 40 MG tablet Take 1 tablet (40 mg total) by mouth daily. 07/14/18   Skeet Latch, MD  polyethylene glycol [facility] / [name] packet Take 17 g by mouth daily as needed for mild constipation. 04/14/18   Dana Allan I, MD  triamcinolone cream (KENALOG) 0.1 % Apply 1 application  topically daily as needed (itching).  [provider]     Allergies:     Allergies  Allergen Reactions  . Aspirin Other (See Comments)    Feels funny   . Allopurinol     Dizzy; hard to ambulate  Couldn't see; sweating  . Amlodipine Other (See Comments)    headache  . Lisinopril Swelling    Face swelling  . Other Itching    Squash     Physical Exam:   Vitals  Blood pressure (!) 175/79, pulse 69, temperature 98.6 F (37 C), temperature source Oral, resp. rate 17, height 5\' 8"  (1.727 m), weight 115.7 kg, SpO2 100 %.   1. General developed female, laying in bed in no apparent distress  2. Normal affect and insight, Not Suicidal or Homicidal, Awake Alert, Oriented X 3.  3. No F.N deficits, ALL C.Nerves Intact, Strength 5/5 all 4 extremities, Sensation intact all 4 extremities, Plantars down going.  4. Ears and Eyes appear Normal, Conjunctivae clear, PERRLA. Moist Oral Mucosa.  5. Supple Neck, JVD distended up to her jaws, No cervical lymphadenopathy appriciated, No Carotid Bruits.  6. Symmetrical Chest wall movement, Good air movement bilaterally, basilar crackles.  7. RRR, No Gallops, Rubs or Murmurs, No Parasternal Heave.  +3 edema extended to lower abdominal wall  8. Positive Bowel Sounds, Abdomen Soft, No tenderness, No organomegaly appriciated,No rebound -guarding or rigidity.  9.  No Cyanosis, Normal Skin Turgor, No Skin Rash or Bruise.  10. Good muscle tone,  joints appear normal , no effusions, Normal ROM.  11. No Palpable Lymph Nodes in Neck or Axillae    Data Review:    CBC Recent Labs  Lab 11/06/18 1024  WBC 6.7  HGB 8.3*  HCT 28.0*  PLT 191  MCV 88.9  MCH 26.3  MCHC 29.6*  RDW 15.4  LYMPHSABS 1.7  MONOABS 0.5  EOSABS 0.2  BASOSABS 0.0   ------------------------------------------------------------------------------------------------------------------  Chemistries  Recent Labs  Lab 11/06/18 1024  NA 141  K 4.5  CL 112*   CO2 22  GLUCOSE 117*  BUN 20  CREATININE 2.12*  CALCIUM 8.4*  AST 13*  ALT 15  ALKPHOS 86  BILITOT 0.5   ------------------------------------------------------------------------------------------------------------------ estimated creatinine clearance is 34.9 mL/min (A) (by C-G formula based on SCr of 2.12 mg/dL (H)). ------------------------------------------------------------------------------------------------------------------ No results for input(s): TSH, T4TOTAL, T3FREE, THYROIDAB in the last 72 hours.  Invalid input(s): FREET3  Coagulation profile No results for input(s): INR, PROTIME in the last 168 hours. ------------------------------------------------------------------------------------------------------------------- No results for input(s): DDIMER in the last 72 hours. -------------------------------------------------------------------------------------------------------------------  Cardiac Enzymes No results for input(s): CKMB, TROPONINI, MYOGLOBIN in the last 168 hours.  Invalid input(s): CK ------------------------------------------------------------------------------------------------------------------    Component Value Date/Time   BNP 1,845.4 (H) 11/06/2018 1024     ---------------------------------------------------------------------------------------------------------------  Urinalysis    Component Value Date/Time   COLORURINE YELLOW 04/27/2018 1334   APPEARANCEUR CLEAR 04/27/2018 1334   LABSPEC 1.012 04/27/2018 1334   PHURINE 7.0 04/27/2018 1334   GLUCOSEU 50 (A) 04/27/2018 1334   HGBUR SMALL (A) 04/27/2018 1334   BILIRUBINUR NEGATIVE 04/27/2018 1334   BILIRUBINUR negative 02/25/2015 1426   KETONESUR NEGATIVE 04/27/2018 1334   PROTEINUR 100 (A) 04/27/2018 1334   UROBILINOGEN 0.2 02/25/2015 1426   UROBILINOGEN 1.0 04/16/2009 0943   NITRITE NEGATIVE 04/27/2018 1334   LEUKOCYTESUR NEGATIVE 04/27/2018 1334     ----------------------------------------------------------------------------------------------------------------   Imaging Results:    Dg Chest 2 View  Result Date: 11/06/2018 CLINICAL DATA:  Leg edema EXAM: CHEST - 2 VIEW COMPARISON:  09/23/2018 FINDINGS: Heart is mildly enlarged. Mild peribronchial thickening. No overt edema. No confluent opacities or effusions. No acute bony abnormality. IMPRESSION: Mild cardiomegaly. Mild bronchitic changes. Electronically Signed   By: Rolm Baptise M.D.   On: 11/06/2018 11:56     Assessment & Plan:    Principal Problem:   Acute on chronic diastolic CHF (congestive heart failure) (HCC) Active Problems:   Diabetes mellitus type 2, uncontrolled, with complications (HCC)   GERD (gastroesophageal reflux disease)   Diabetic neuropathy (HCC)   Current tobacco use   Essential (primary) hypertension   Anemia, chronic renal failure, stage 3 (moderate) (HCC)   Acute on chronic diastolic CHF -Patient with significant volume overload, difficult lower extremity edema, has been compliant with salt restricted diet, and diuresis, but not with fluid restriction. -We will start on IV Lasix 60 mg IV twice daily, will keep daily weights, and strict ins and outs. -Monitor renal function closely. -He denies any chest pain -Noted on telemetry  CKD stage III -Creatinine is 2.1, actually lower than her baseline which is around 3, this is most likely in the setting of volume overload, will monitor closely as she will be on IV diuresis.  Diabetes mellitus type 2 -Continue with the glipizide, will add insulin sliding scale during hospital stay  Hypertension -Blood pressure elevated on presentation as she did not take her morning meds, will resume hydralazine, isosorbide and Coreg, will add PRN hydralazine during hospital stay  Anemia of chronic kidney disease -Hemoglobin 8.3, may benefit from Aranesp as an outpatient.  GERD -Continue with PPI  Diabetic neuropathy  -Continue with gabapentin  Tobacco abuse -She was counseled, she is planning to stop  DVT Prophylaxis Heparin -  SCDs   AM Labs Ordered, also please review Full Orders  Family Communication: Admission, patients condition and plan of care including tests being ordered have been discussed with the patient who indicate understanding and agree with the plan and Code Status.  Code Status full  Likely DC to  home  Condition GUARDED    Consults called:   Admission status: Inpatient, as I do anticipate she will need 2 to 3 days on IV diuresis  Time spent in minutes : 60 minutes   Phillips Climes M.D on 11/06/2018 at 12:53 PM  Between 7am to 7pm - Pager - [phone]. After 7pm go to [email or web address] - password [facility]  [facility] - Office  [phone]

## 2018-11-06 NOTE — ED Provider Notes (Signed)
[facility] EMERGENCY DEPARTMENT Provider Note   CSN: [id number] Arrival date & time: 11/06/18  4854     History   Chief Complaint Chief Complaint  Patient presents with   abdominal edema   Leg Swelling    bilateral    HPI Zoe Clarke is a 68 y.o. female, PMH chronic systolic and diastolic heart failure, hypertension, OSA, GERD, T2DM, CKD 3, diabetic neuropathy, chronic anemia, gout.  History provided by patient.  Patient reports that for the last 3 days or more she has noticed worsening swelling in her abdomen and bilateral lower extremities.  Patient notes that she has had a history of bilateral lower extremity edema, but has never had swelling in her abdomen before.  She notes that overnight, she felt as though the swelling greatly worsened in her abdomen and came to ED.  She denies any shortness of breath or dyspnea on exertion.  Denies weakness.  Denies fevers, other infectious symptoms.  She reports a chronic dry cough, that is unchanged.  She states that otherwise she has been feeling well.  She notes that she has been taking her Lasix as directed every morning, and has been putting out a large amount of urine with this.  Also reports that she has been taking her hydralazine 3 times a day, but states "when I take it, it makes my head hurt."  Denies any changes of vision, chest pain, focal weakness.  She denies a history of liver disease, alcohol use, substance abuse.  Does report smoking, had one cigarette yesterday.     Patient seen by cardiology on 8/6 and was noted to not be taking hydralazine or Lasix as prescribed.  She was being seen for cardiac clearance for cholecystectomy.  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 ([place]) 09/22/2018   Thyroid nodule 09/22/2018   Anemia, chronic renal failure, stage 3 (moderate) ([place]) 09/22/2018   Sleep apnea 09/22/2018   Precordial chest pain    Symptomatic cholelithiasis 07/30/2018   Acute on chronic combined systolic (congestive) and diastolic (congestive) heart failure ([place]) 07/29/2018   Dyspnea 07/29/2018   Tobacco abuse counseling 04/27/2018   Acute pyelonephritis 04/10/2018   Hypertensive urgency 04/10/2018   Leukocytosis 09/10/2017   Anemia 09/10/2017   Knee pain, bilateral 01/13/2015   Bilateral hip pain 11/23/2014   Diabetes mellitus type 2, uncontrolled, with complications ([place]) 62/70/3500   Intertrigo 10/25/2014   GERD (gastroesophageal reflux disease) 10/25/2014   Diabetic neuropathy ([place]) 10/25/2014   Chronic low back pain 10/25/2014   Current tobacco use 11/11/2013   Stage 3 chronic kidney disease due to type 2 diabetes mellitus ([place]) 09/02/2013   Essential (primary) hypertension 09/02/2013   Gout 12/05/2012    Past Surgical History:  Procedure Laterality Date   DILATION AND CURETTAGE OF  UTERUS     EYE SURGERY Left    removed cataract w/ lens   TUBAL LIGATION       OB History   No obstetric history on file.      Home Medications    Prior to Admission medications   Medication Sig Start Date End Date Taking? Authorizing Provider  albuterol (PROVENTIL HFA;VENTOLIN HFA) 108 (90 Base) MCG/ACT inhaler Inhale 2 puffs into the lungs every 6 (six) hours as needed for wheezing or shortness of breath. 04/14/18   Bonnell Public, MD    atorvastatin (LIPITOR) 40 MG tablet Take 40 mg by mouth daily.    [provider]  carvedilol (COREG) 12.5 MG tablet Take 1 tablet (12.5 mg total) by mouth 2 (two) times daily with a meal. 09/30/18   Pokhrel, Laxman, MD  cyclobenzaprine (FLEXERIL) 5 MG tablet Take 5 mg by mouth at bedtime.    [provider]  ergocalciferol (VITAMIN D2) 1.25 MG (50000 UT) capsule Take 50,000 Units by mouth once a week.    [provider]  fexofenadine (ALLEGRA) 180 MG tablet Take 180 mg by mouth daily.     [provider]  furosemide (LASIX) 20 MG tablet Take 2 tablets (40 mg total) by mouth daily. 10/01/18   Pokhrel, Corrie Mckusick, MD  gabapentin (NEURONTIN) 300 MG capsule Take 300 mg by mouth 3 (three) times daily.     [provider]  glipiZIDE (GLUCOTROL XL) 10 MG 24 hr tablet Take 10 mg by mouth 2 (two) times a day.    [provider]  hydrALAZINE (APRESOLINE) 25 MG tablet Take 3 tablets (75 mg total) by mouth 3 (three) times daily. 09/30/18 10/30/18  Pokhrel, Corrie Mckusick, MD  hydrocortisone cream 1 % Apply topically 2 (two) times daily. Areas of itch 09/30/18   Pokhrel, Corrie Mckusick, MD  isosorbide mononitrate (IMDUR) 60 MG 24 hr tablet Take 1 tablet (60 mg total) by mouth daily. 10/01/18 10/31/18  Pokhrel, Corrie Mckusick, MD  linaclotide (LINZESS) 145 MCG CAPS capsule Take 145 mcg by mouth as needed (constipation).    [provider]  oxyCODONE-acetaminophen (PERCOCET/ROXICET) 5-325 MG tablet Take 1 tablet by mouth every 6 (six) hours as needed for moderate pain or severe pain.     [provider]  pantoprazole (PROTONIX) 40 MG tablet Take 1 tablet (40 mg total) by mouth daily. 07/14/18   Skeet Latch, MD  polyethylene glycol [facility] / [name] packet Take 17 g by mouth daily as needed for mild constipation. 04/14/18   Dana Allan I, MD  triamcinolone cream (KENALOG) 0.1 % Apply 1 application topically daily as needed (itching).    [provider]     Family History Family History  Problem Relation Age of Onset   Arthritis Mother    Hypertension Mother    Heart failure Mother    Emphysema Mother    Arthritis Maternal Grandmother    Hypertension Maternal Grandmother    Stroke Maternal Grandfather    Cancer Sister        unknown type cancer   Heart failure Sister    Lung disease Sister    Heart failure Sister    Colon cancer Neg Hx    Esophageal cancer Neg Hx    Stomach cancer Neg Hx    Rectal cancer Neg Hx     Social History Social History   Tobacco Use   Smoking status: Current Every Day Smoker    Packs/day: 0.50    Years: 39.00  Pack years: 19.50    Types: Cigarettes   Smokeless tobacco: Never Used   Tobacco comment: 1 pack last 3 days  Substance Use Topics   Alcohol use: No    Alcohol/week: 0.0 standard drinks   Drug use: Not Currently    Types: Marijuana     Allergies   Aspirin, Allopurinol, Amlodipine, Lisinopril, and Other   Review of Systems Review of Systems  Constitutional: Negative for activity change, appetite change, diaphoresis, fatigue and fever.  HENT: Negative for congestion and sore throat.   Respiratory: Positive for cough (chronic). Negative for chest tightness and shortness of breath.   Cardiovascular: Positive for leg swelling. Negative for chest pain.  Gastrointestinal: Negative for abdominal pain, blood in stool, constipation, diarrhea, nausea and vomiting.       Abdominal swelling  Genitourinary: Positive for frequency (with Lasix). Negative for dysuria and hematuria.  Musculoskeletal: Negative for myalgias.  Skin: Negative for color change.  Neurological: Positive for headaches (on top of head after taking hydralazine). Negative for dizziness, syncope and weakness.     Physical Exam Updated Vital Signs BP (!) 181/86    Pulse 75    Temp 98.6 F (37 C) (Oral)    Resp 19    Ht 5\' 8"  (1.727 m)    Wt 115.7 kg    SpO2 97%    BMI 38.77 kg/m   Physical  Exam Constitutional:      General: She is not in acute distress.    Appearance: She is obese. She is not ill-appearing, toxic-appearing or diaphoretic.  HENT:     Head: Normocephalic and atraumatic.     Mouth/Throat:     Mouth: Mucous membranes are moist.     Pharynx: No posterior oropharyngeal erythema.  Eyes:     General: No scleral icterus.    Extraocular Movements: Extraocular movements intact.     Conjunctiva/sclera: Conjunctivae normal.     Pupils: Pupils are equal, round, and reactive to light.     Comments: OD with obvious right cataract  Neck:     Musculoskeletal: Normal range of motion and neck supple. No muscular tenderness.  Cardiovascular:     Rate and Rhythm: Normal rate and regular rhythm.     Heart sounds: No murmur. No friction rub. No gallop.   Pulmonary:     Effort: Pulmonary effort is normal. No respiratory distress.     Breath sounds: No rales.     Comments: Diffuse mild end expiratory wheezing  Abdominal:     Palpations: Abdomen is soft. There is no mass.     Tenderness: There is no abdominal tenderness. There is no guarding or rebound.     Comments: Pitting edema in lower abdomen, L>R  Musculoskeletal:     Right lower leg: Edema (2+ pitting edema of lower leg, no edema noted in thigh) present.     Left lower leg: Edema (2+ pitting edema of lower leg, no edema noted in thigh) present.  Lymphadenopathy:     Cervical: No cervical adenopathy.  Skin:    General: Skin is warm and dry.     Coloration: Skin is not jaundiced.  Neurological:     General: No focal deficit present.     Mental Status: She is alert and oriented to person, place, and time.  Psychiatric:        Mood and Affect: Mood normal.        Behavior: Behavior normal.      ED Treatments / Results  Labs (all labs ordered are listed, but only abnormal results are displayed) Labs Reviewed  COMPREHENSIVE METABOLIC PANEL - Abnormal; Notable for the following components:      Result Value    Chloride 112 (*)    Glucose, Bld 117 (*)    Creatinine, Ser 2.12 (*)    Calcium 8.4 (*)    Total Protein 6.2 (*)    Albumin 2.9 (*)    AST 13 (*)    GFR calc non Af Amer 24 (*)    GFR calc Af Amer 27 (*)    All other components within normal limits  CBC WITH DIFFERENTIAL/PLATELET - Abnormal; Notable for the following components:   RBC 3.15 (*)    Hemoglobin 8.3 (*)    HCT 28.0 (*)    MCHC 29.6 (*)    All other components within normal limits  BRAIN NATRIURETIC PEPTIDE - Abnormal; Notable for the following components:   B Natriuretic Peptide 1,845.4 (*)    All other components within normal limits  SARS CORONAVIRUS 2 (HOSPITAL ORDER, [facility])    EKG None  Radiology Dg Chest 2 View  Result Date: 11/06/2018 CLINICAL DATA:  Leg edema EXAM: CHEST - 2 VIEW COMPARISON:  09/23/2018 FINDINGS: Heart is mildly enlarged. Mild peribronchial thickening. No overt edema. No confluent opacities or effusions. No acute bony abnormality. IMPRESSION: Mild cardiomegaly. Mild bronchitic changes. Electronically Signed   By: Rolm Baptise M.D.   On: 11/06/2018 11:56    Procedures Procedures (including critical care time)  Medications Ordered in ED Medications  furosemide (LASIX) injection 40 mg (has no administration in time range)     Initial Impression / Assessment and Plan / ED Course  I have reviewed the triage vital signs and the nursing notes.  Pertinent labs & imaging results that were available during my care of the patient were reviewed by me and considered in my medical decision making (see chart for details).  Zoe Clarke is a 68 yo female with PMH chronic systolic and diastolic heart failure, hypertension, OSA, GERD, T2DM, CKD 3, diabetic neuropathy, chronic anemia, gout, who presents with worsening bilateral lower extremity edema and new abdominal pitting edema.  Given patient's history of chronic combined heart failure, suspect acute exacerbation  of this.  True fluid status is difficult to assess in patient due to body habitus.  Abdomen does have pitting edema on left greater than right, patient's abdomen does appear to draped left more, therefore in dependent distribution.  She does not endorse any respiratory symptoms at this time, but will work-up for heart failure exacerbation including CXR, CBC, CMP, and BNP.  Abdominal exam aside from edema is benign, therefore will hold off on imaging at this time.  Doubt ascites, patient does not have a history of liver dysfunction.  She does have known cholelithiasis.    BNP elevated to 1800, up from 1400 during last hospitalization for exacerbation of CHF.  Chest x-ray without significant pulmonary edema.  Upon recheck, patient was lying flat in bed and struggling to breathe, patient was sat up and had improvement in her breathing.  Also later endorsed that she has been getting progressively short of breath at home.  She remained stable on room air.  Creatinine appears to be improved from previous, does have CKD 3.  No evidence of elevated liver enzymes, therefore doubt ascites as cause of edema.  She will be given a dose of IV Lasix 40 mg x 1 and will  consult hospitalist for admission for further diuresis. Will obtain rapid COVID for admission.  Spoke with [facility] hospitalist who agrees to admit patient.    Final Clinical Impressions(s) / ED Diagnoses   Final diagnoses:  Leg edema  Acute on chronic combined systolic and diastolic congestive heart failure [facility])    ED Discharge Orders    None       Cleophas Dunker, DO 11/06/18 1234    Elnora Morrison, MD 11/06/18 [phone]

## 2018-11-06 NOTE — Plan of Care (Signed)

## 2018-11-06 NOTE — Telephone Encounter (Signed)
New Message:    Pt's husband called and wanted you to know that patient is in the hospital.

## 2018-11-06 NOTE — Telephone Encounter (Signed)
Left message for husband we will see patient in hospital if needed.

## 2018-11-06 NOTE — ED Notes (Signed)
Pt transported to xray 

## 2018-11-07 ENCOUNTER — Encounter: Payer: PPO | Admitting: Physician Assistant

## 2018-11-07 LAB — BASIC METABOLIC PANEL
Anion gap: 7 (ref 5–15)
BUN: 22 mg/dL (ref 8–23)
CO2: 27 mmol/L (ref 22–32)
Calcium: 8.2 mg/dL — ABNORMAL LOW (ref 8.9–10.3)
Chloride: 107 mmol/L (ref 98–111)
Creatinine, Ser: 2.2 mg/dL — ABNORMAL HIGH (ref 0.44–1.00)
GFR calc Af Amer: 26 mL/min — ABNORMAL LOW (ref >60)
GFR calc non Af Amer: 23 mL/min — ABNORMAL LOW (ref >60)
Glucose, Bld: 139 mg/dL — ABNORMAL HIGH (ref 70–99)
Potassium: 4.5 mmol/L (ref 3.5–5.1)
Sodium: 141 mmol/L (ref 135–145)

## 2018-11-07 LAB — GLUCOSE, CAPILLARY
Glucose-Capillary: 133 mg/dL — ABNORMAL HIGH (ref 70–99)
Glucose-Capillary: 124 mg/dL — ABNORMAL HIGH (ref 70–99)
Glucose-Capillary: 60 mg/dL — ABNORMAL LOW (ref 70–99)
Glucose-Capillary: 86 mg/dL (ref 70–99)
Glucose-Capillary: 45 mg/dL — ABNORMAL LOW (ref 70–99)
Glucose-Capillary: 59 mg/dL — ABNORMAL LOW (ref 70–99)

## 2018-11-07 MED ORDER — VITAMIN D (ERGOCALCIFEROL) 1.25 MG (50000 UNIT) PO CAPS
50000.0000 [IU] | ORAL_CAPSULE | ORAL | Status: DC
  Administered 2018-11-07: 50000 [IU] via ORAL
  Filled 2018-11-07: qty 1

## 2018-11-07 MED ORDER — ALUM & MAG HYDROXIDE-SIMETH 200-200-20 MG/5ML PO SUSP
15.0000 mL | ORAL | Status: DC | PRN
  Administered 2018-11-07: 15 mL via ORAL
  Filled 2018-11-07: qty 30

## 2018-11-07 MED ORDER — GLUCOSE 40 % PO GEL
1.0000 | ORAL | Status: AC
  Administered 2018-11-07: 37.5 g via ORAL
  Filled 2018-11-07: qty 1

## 2018-11-07 MED ORDER — CYCLOBENZAPRINE HCL 5 MG PO TABS
5.0000 mg | ORAL_TABLET | Freq: Every day | ORAL | Status: DC
  Administered 2018-11-07 – 2018-11-10 (×4): 10 mg via ORAL
  Filled 2018-11-07 (×4): qty 2

## 2018-11-07 MED ORDER — POLYETHYLENE GLYCOL 3350 17 G PO PACK: 17.0000 g | PACK | Freq: Every day | ORAL | Status: DC | PRN

## 2018-11-07 MED ORDER — DEXTROSE 50 % IV SOLN
25.0000 g | INTRAVENOUS | Status: AC
  Administered 2018-11-07: 25 g via INTRAVENOUS
  Filled 2018-11-07: qty 50

## 2018-11-07 NOTE — Progress Notes (Signed)
Hypoglycemic Event  CBG: 60  Treatment: 4 oz juice/soda  Symptoms: None  Follow-up CBG: Time:1800 CBG Result:86  Possible Reasons for Event: Unknown  Comments/MD notified:nurse driven protocol    Abner Greenspan

## 2018-11-07 NOTE — Progress Notes (Signed)
Patient is currently refusing her hydralazine 75 mg, patient educated on the importance of taking medication consistently, will notify MD Neta Mends 10:47 AM 11-07-2018

## 2018-11-07 NOTE — Progress Notes (Signed)
PROGRESS NOTE    Zoe Clarke  WER:[id number] DOB: 11/07/1950 DOA: 11/06/2018 PCP: Antony Contras, MD   Brief Narrative: As per HPI 68 y.o. female, with past medical history for chronic diastolic CHF, EF 60 to 19%, CKD stage III, type 2 diabetes, hypertension, still using tobacco, presents to ED secondary to complaints of worsening lower extremity edema extending all the way to her abdomen, as well ports some dyspnea, exertional and mild orthopnea as well, patient reports she has been compliant with no salt diet, her medications, but she has not been compliant with fluid restriction and still smoking, denies any chest pain, fever, chills, nausea, vomiting, dysuria or polyuria. - in ED there is no hypoxia, chest x-ray significant for mild cardiomegaly, saturating 100% on room air, but has significantly elevated proBNP at 1845, distended JVD, elevated creatinine at 2.1, which is better than her baseline which is around 3. Was admitted For further management.  Subjective: No acute event overnigt multiple complaints Reports she got gallstone- hurts sometime. C/o swelling up to abdomen. BP goes up and down at home. Per patient normal weight around 239 lb- on admission was 255 ln-->251. Does not use o2 at home- currently on 1l [place]- reprots she gags from air during sleep (for many years) Does not do salt at home- does not drink soda but lots of gingerale  Assessment & Plan:   Acute on chronic diastolic CHF: With significant volume overload with leg edema weight gain.  Patient not on fluid restriction at home.  Discussed about dietary compliance.  Continue Lasix 60 mg twice daily with close monitoring of intake output and daily weight.  Monitor renal function closely.  Diabetes mellitus type 2,with complications: Blood sugar well controlled.  Continue glipizide and sliding scale insulin. Recent Labs  Lab 11/06/18 1713 11/06/18 2118 11/06/18 2207 11/07/18 0620 11/07/18 1137  GLUCAP 92  143* 137* 133* 124*   CKD stage III baseline creatinine around 3 and appears stable.  Monitor closely. Recent Labs  Lab 11/06/18 1024 11/07/18 0342  BUN 20 22  CREATININE 2.12* 2.20*   GERD: Continue PPI.  Anemia of chronic renal disease hemoglobin stable but low monitor.may benefit from Aranesp as o/p  Diabetic neuropathy: On gabapentin.  Current tobacco use: Cessation advised.  Essential (primary) hypertension: Blood pressure borderline controlled.  Hydralazine, isosorbide Coreg.  Patient is refusing hydralazine.  Body mass index is 38.23 kg/m.    DVT prophylaxis:Heparin Code Status: full Family Communication: plan of care discussed with patient in detail. She lives w husband Disposition Plan: Remains inpatient pending clinical improvement.   Consultants:  none Procedures: none  Antimicrobials: Anti-infectives (From admission, onward)   None       Objective: Vitals:   11/07/18 0039 11/07/18 0256 11/07/18 0300 11/07/18 0811  BP: (!) 154/69 (!) 156/69  (!) 175/73  Pulse: 72 74  73  Resp: 20 20  17   Temp: 98.2 F (36.8 C) 98.6 F (37 C)  98.4 F (36.9 C)  TempSrc: Oral Oral  Oral  SpO2: 99% 100%  98%  Weight:   114 kg   Height:        Intake/Output Summary (Last 24 hours) at 11/07/2018 0826 Last data filed at 11/07/2018 0813 Gross per 24 hour  Intake 1080 ml  Output 2250 ml  Net -1170 ml   Filed Weights   11/06/18 0927 11/06/18 1623 11/07/18 0300  Weight: 115.7 kg 114.6 kg 114 kg   Weight change:   Body mass index is 38.23  kg/m.  Intake/Output from previous day: 08/20 0701 - 08/21 0700 In: 840 [P.O.:840] Out: 2250 [Urine:2250] Intake/Output this shift: Total I/O In: 240 [P.O.:240] Out: -   Examination:  General exam: Appears calm and comfortable,Not in distress, on [place], obese,  HEENT:PERRL,Oral mucosa moist, Ear/Nose normal on gross exam Respiratory system: Bilateral diminished BS- large body habitus.  Cardiovascular system: S1 & S2  heard,No JVD, murmurs. Gastrointestinal system: Abdomen is  soft, obese-large body habitus  Nervous System:Alert and oriented. No focal neurological deficits/moving extremities, sensation intact. Extremities: extensive leg edema, no clubbing, distal peripheral pulses palpable. Skin: No rashes, lesions, no icterus MSK: Normal muscle bulk,tone ,power  Medications:  Scheduled Meds: . atorvastatin  40 mg Oral Daily  . carvedilol  12.5 mg Oral BID WC  . cyclobenzaprine  5-10 mg Oral QHS  . furosemide  60 mg Intravenous Q12H  . gabapentin  300 mg Oral TID  . glipiZIDE  10 mg Oral Q breakfast  . heparin  5,000 Units Subcutaneous Q8H  . hydrALAZINE  75 mg Oral TID  . insulin aspart  0-5 Units Subcutaneous QHS  . insulin aspart  0-9 Units Subcutaneous TID WC  . isosorbide mononitrate  60 mg Oral Daily  . loratadine  10 mg Oral Daily  . pantoprazole  40 mg Oral Daily  . sodium chloride flush  3 mL Intravenous Q12H   Continuous Infusions: . sodium chloride      Data Reviewed: I have personally reviewed following labs and imaging studies  CBC: Recent Labs  Lab 11/06/18 1024  WBC 6.7  NEUTROABS 4.2  HGB 8.3*  HCT 28.0*  MCV 88.9  PLT 161   Basic Metabolic Panel: Recent Labs  Lab 11/06/18 1024 11/07/18 0342  NA 141 141  K 4.5 4.5  CL 112* 107  CO2 22 27  GLUCOSE 117* 139*  BUN 20 22  CREATININE 2.12* 2.20*  CALCIUM 8.4* 8.2*   GFR: Estimated Creatinine Clearance: 33.3 mL/min (A) (by C-G formula based on SCr of 2.2 mg/dL (H)). Liver Function Tests: Recent Labs  Lab 11/06/18 1024  AST 13*  ALT 15  ALKPHOS 86  BILITOT 0.5  PROT 6.2*  ALBUMIN 2.9*   No results for input(s): LIPASE, AMYLASE in the last 168 hours. No results for input(s): AMMONIA in the last 168 hours. Coagulation Profile: No results for input(s): INR, PROTIME in the last 168 hours. Cardiac Enzymes: No results for input(s): CKTOTAL, CKMB, CKMBINDEX, TROPONINI in the last 168 hours. BNP (last 3  results) No results for input(s): PROBNP in the last 8760 hours. HbA1C: No results for input(s): HGBA1C in the last 72 hours. CBG: Recent Labs  Lab 11/06/18 1713 11/06/18 2118 11/06/18 2207 11/07/18 0620  GLUCAP 92 143* 137* 133*   Lipid Profile: No results for input(s): CHOL, HDL, LDLCALC, TRIG, CHOLHDL, LDLDIRECT in the last 72 hours. Thyroid Function Tests: No results for input(s): TSH, T4TOTAL, FREET4, T3FREE, THYROIDAB in the last 72 hours. Anemia Panel: No results for input(s): VITAMINB12, FOLATE, FERRITIN, TIBC, IRON, RETICCTPCT in the last 72 hours. Sepsis Labs: No results for input(s): PROCALCITON, LATICACIDVEN in the last 168 hours.  Recent Results (from the past 240 hour(s))  SARS Coronavirus 2 [facility] order, Performed in [facility] hospital lab) Nasopharyngeal Nasopharyngeal Swab     Status: None   Collection Time: 11/06/18  1:28 PM   Specimen: Nasopharyngeal Swab  Result Value Ref Range Status   SARS Coronavirus 2 NEGATIVE NEGATIVE Final    Comment: (NOTE) If result  is NEGATIVE SARS-CoV-2 target nucleic acids are NOT DETECTED. The SARS-CoV-2 RNA is generally detectable in upper and lower  respiratory specimens during the acute phase of infection. The lowest  concentration of SARS-CoV-2 viral copies this assay can detect is 250  copies / mL. A negative result does not preclude SARS-CoV-2 infection  and should not be used as the sole basis for treatment or other  patient management decisions.  A negative result may occur with  improper specimen collection / handling, submission of specimen other  than nasopharyngeal swab, presence of viral mutation(s) within the  areas targeted by this assay, and inadequate number of viral copies  (<250 copies / mL). A negative result must be combined with clinical  observations, patient history, and epidemiological information. If result is POSITIVE SARS-CoV-2 target nucleic acids are DETECTED. The SARS-CoV-2 RNA is  generally detectable in upper and lower  respiratory specimens dur ing the acute phase of infection.  Positive  results are indicative of active infection with SARS-CoV-2.  Clinical  correlation with patient history and other diagnostic information is  necessary to determine patient infection status.  Positive results do  not rule out bacterial infection or co-infection with other viruses. If result is PRESUMPTIVE POSTIVE SARS-CoV-2 nucleic acids MAY BE PRESENT.   A presumptive positive result was obtained on the submitted specimen  and confirmed on repeat testing.  While 2019 novel coronavirus  (SARS-CoV-2) nucleic acids may be present in the submitted sample  additional confirmatory testing may be necessary for epidemiological  and / or clinical management purposes  to differentiate between  SARS-CoV-2 and other Sarbecovirus currently known to infect humans.  If clinically indicated additional testing with an alternate test  methodology [phone]) is advised. The SARS-CoV-2 RNA is generally  detectable in upper and lower respiratory sp ecimens during the acute  phase of infection. The expected result is Negative. Fact Sheet for Patients:  StrictlyIdeas.no Fact Sheet for Healthcare Providers: BankingDealers.co.za This test is not yet approved or cleared by the [place] FDA and has been authorized for detection and/or diagnosis of SARS-CoV-2 by FDA under an Emergency Use Authorization (EUA).  This EUA will remain in effect (meaning this test can be used) for the duration of the COVID-19 declaration under Section 564(b)(1) of the Act, 21 U.S.C. section 360bbb-3(b)(1), unless the authorization is terminated or revoked sooner. Performed at [facility], [place] [address], [place], [place] [address]       Radiology Studies: Dg Chest 2 View  Result Date: 11/06/2018 CLINICAL DATA:  Leg edema EXAM: CHEST - 2 VIEW COMPARISON:   09/23/2018 FINDINGS: Heart is mildly enlarged. Mild peribronchial thickening. No overt edema. No confluent opacities or effusions. No acute bony abnormality. IMPRESSION: Mild cardiomegaly. Mild bronchitic changes. Electronically Signed   By: Rolm Baptise M.D.   On: 11/06/2018 11:56      LOS: 1 day   Time spent: More than 50% of that time was spent in counseling and/or coordination of care.  Antonieta Pert, MD [facility]  11/07/2018, 8:26 AM

## 2018-11-08 ENCOUNTER — Inpatient Hospital Stay (HOSPITAL_COMMUNITY): Payer: PPO

## 2018-11-08 LAB — GLUCOSE, CAPILLARY
Glucose-Capillary: 125 mg/dL — ABNORMAL HIGH (ref 70–99)
Glucose-Capillary: 342 mg/dL — ABNORMAL HIGH (ref 70–99)
Glucose-Capillary: 47 mg/dL — ABNORMAL LOW (ref 70–99)
Glucose-Capillary: 57 mg/dL — ABNORMAL LOW (ref 70–99)
Glucose-Capillary: 58 mg/dL — ABNORMAL LOW (ref 70–99)
Glucose-Capillary: 63 mg/dL — ABNORMAL LOW (ref 70–99)
Glucose-Capillary: 68 mg/dL — ABNORMAL LOW (ref 70–99)
Glucose-Capillary: 75 mg/dL (ref 70–99)
Glucose-Capillary: 75 mg/dL (ref 70–99)
Glucose-Capillary: 78 mg/dL (ref 70–99)
Glucose-Capillary: 80 mg/dL (ref 70–99)

## 2018-11-08 LAB — BASIC METABOLIC PANEL
Anion gap: 9 (ref 5–15)
BUN: 29 mg/dL — ABNORMAL HIGH (ref 8–23)
CO2: 24 mmol/L (ref 22–32)
Calcium: 8.1 mg/dL — ABNORMAL LOW (ref 8.9–10.3)
Chloride: 106 mmol/L (ref 98–111)
Creatinine, Ser: 2.57 mg/dL — ABNORMAL HIGH (ref 0.44–1.00)
GFR calc Af Amer: 22 mL/min — ABNORMAL LOW (ref >60)
GFR calc non Af Amer: 19 mL/min — ABNORMAL LOW (ref >60)
Glucose, Bld: 49 mg/dL — ABNORMAL LOW (ref 70–99)
Potassium: 4.7 mmol/L (ref 3.5–5.1)
Sodium: 139 mmol/L (ref 135–145)

## 2018-11-08 LAB — CBC
HCT: 26.2 % — ABNORMAL LOW (ref 36.0–46.0)
Hemoglobin: 8 g/dL — ABNORMAL LOW (ref 12.0–15.0)
MCH: 26.5 pg (ref 26.0–34.0)
MCHC: 30.5 g/dL (ref 30.0–36.0)
MCV: 86.8 fL (ref 80.0–100.0)
Platelets: 177 10*3/uL (ref 150–400)
RBC: 3.02 MIL/uL — ABNORMAL LOW (ref 3.87–5.11)
RDW: 14.9 % (ref 11.5–15.5)
WBC: 6.3 10*3/uL (ref 4.0–10.5)
nRBC: 0 % (ref 0.0–0.2)

## 2018-11-08 MED ORDER — DEXTROSE 5 % IV SOLN
INTRAVENOUS | Status: AC
  Administered 2018-11-08: 17:00:00 via INTRAVENOUS

## 2018-11-08 MED ORDER — DEXTROSE 50 % IV SOLN
INTRAVENOUS | Status: AC
  Administered 2018-11-08: 25 mL
  Filled 2018-11-08: qty 50

## 2018-11-08 MED ORDER — DEXTROSE 50 % IV SOLN
12.5000 g | INTRAVENOUS | Status: AC
  Administered 2018-11-08: 06:00:00 12.5 g via INTRAVENOUS

## 2018-11-08 MED ORDER — FUROSEMIDE 10 MG/ML IJ SOLN
60.0000 mg | Freq: Two times a day (BID) | INTRAMUSCULAR | Status: DC
  Administered 2018-11-08 (×2): 60 mg via INTRAVENOUS
  Filled 2018-11-08 (×2): qty 6

## 2018-11-08 MED ORDER — GLIPIZIDE ER 10 MG PO TB24
10.0000 mg | ORAL_TABLET | Freq: Every day | ORAL | Status: DC
  Filled 2018-11-08: qty 1

## 2018-11-08 MED ORDER — INSULIN ASPART 100 UNIT/ML ~~LOC~~ SOLN
0.0000 [IU] | Freq: Three times a day (TID) | SUBCUTANEOUS | Status: DC
  Administered 2018-11-09 – 2018-11-11 (×5): 2 [IU] via SUBCUTANEOUS

## 2018-11-08 MED ORDER — LIVING WELL WITH DIABETES BOOK
Freq: Once | Status: AC
  Administered 2018-11-10: 14:00:00
  Filled 2018-11-08: qty 1

## 2018-11-08 NOTE — Evaluation (Deleted)
Physical Therapy Evaluation Patient Details Name: Zoe Clarke MRN: [id number] DOB: 08/12/1950 Today's Date: 11/08/2018   History of Present Illness  68 y.o. female, with past medical history for chronic diastolic CHF, EF 60 to 78%, CKD stage III, type 2 diabetes, hypertension, still using tobacco, presents to ED secondary to complaints of worsening lower extremity edema extending all the way to her abdomen    Clinical Impression  Pt cooperative and walked 100 feet on RA with RW - 99% O2 sats.  Pt would benefit from [facility] services - to help with her steps, safety with weighing and increasing her activity level.  Will work with pt while she is in hospital prior to DC.  Pts plan - to DC home with husbands assist.    Follow Up Recommendations Home health PT;Supervision for mobility/OOB    Equipment Recommendations  None recommended by PT    Recommendations for Other Services       Precautions / Restrictions Precautions Precaution Comments: pt denies falls - but had near fall recently Restrictions Weight Bearing Restrictions: No      Mobility  Bed Mobility                  Transfers Overall transfer level: Needs assistance Equipment used: Rolling walker (2 wheeled) Transfers: Sit to/from Stand Sit to Stand: Min guard;From elevated surface         General transfer comment: pt sitting EOB when i arrived.  pt needed very high surface to stand - she said her bed at home - allows her feet to dangle  Ambulation/Gait Ambulation/Gait assistance: Min guard Gait Distance (Feet): 100 Feet Assistive device: Rolling walker (2 wheeled) Gait Pattern/deviations: Trunk flexed;Step-through pattern;Decreased step length - right;Decreased step length - left;Shuffle;Wide base of support     General Gait Details: pt walked with RW - steady with turning and walking.  no loss of balance.  pt complained of SOB - but O2 sats 99%.  pt needs to do more walking in her home  setting  Stairs            Wheelchair Mobility    Modified Rankin (Stroke Patients Only)       Balance Overall balance assessment: Mild deficits observed, not formally tested                                           Pertinent Vitals/Pain Pain Assessment: 0-10 Pain Score: 5  Pain Location: right hip and both knees Pain Descriptors / Indicators: Aching;Discomfort;Sore Pain Intervention(s): Limited activity within patient's tolerance;Repositioned;Monitored during session    [place] expects to be discharged to:: Private residence Living Arrangements: Spouse/significant other Available Help at Discharge: Family;Available 24 hours/day Type of Home: House Home Access: Stairs to enter Entrance Stairs-Rails: Left Entrance Stairs-Number of Steps: 3 Home Layout: One level Home Equipment: Bedside commode;Walker - 2 wheels;Cane - single point;Walker - 4 wheels;Grab bars - tub/shower;Shower seat Additional Comments: pt reports she has 2 BSC - one over toilet and one by the bed.  Pt doesnt like her shower chair - feels it is too wobbly.  Pt has 2 wheeled walker but uses her husbands 4 wheeled walker as seh likes it better.  Pts PCP has ordered for her to get power chair for going to MD appts etc.  she has seen someone about specs.    Prior Function Level of  Independence: Needs assistance   Gait / Transfers Assistance Needed: uses rollator to walk to bathroom at home; supposed to be getting an electric w/c thru [place] / [employer] Assistance Needed: assist with bra, dressing LB; bathes herself in shower; independent toileting/ toilet transfers  Comments: pt tearful - husband just diagnosed with prostate cancer and she is worried about him and his next steps     Hand Dominance        Extremity/Trunk Assessment   Upper Extremity Assessment Upper Extremity Assessment: Defer to OT evaluation    Lower Extremity  Assessment Lower Extremity Assessment: Generalized weakness(right leg with no pain at rest but hurts with movement)    Cervical / Trunk Assessment Cervical / Trunk Assessment: Normal  Communication   Communication: No difficulties  Cognition Arousal/Alertness: Awake/alert Behavior During Therapy: WFL for tasks assessed/performed Overall Cognitive Status: Within Functional Limits for tasks assessed                                 General Comments: pt tearful when talking about her husband and her declining health.  pt doesnt weigh at home - doesnt know how to change the b atteries in her scales      General Comments General comments (skin integrity, edema, etc.): we talked about wieghing at home - pt doesnt know how to change batteries in her scale - HH can help    Exercises     Assessment/Plan    PT Assessment Patient needs continued PT services  PT Problem List Decreased strength;Decreased mobility;Decreased activity tolerance;Decreased balance;Pain       PT Treatment Interventions DME instruction;Therapeutic activities;Gait training;Therapeutic exercise;Patient/family education;Balance training;Functional mobility training    PT Goals (Current goals can be found in the Care Plan section)  Acute Rehab PT Goals Patient Stated Goal: to be safe at home - to get power chair for going out via [facility] PT Goal Formulation: With patient Time For Goal Achievement: 11/15/18 Potential to Achieve Goals: Good    Frequency Min 3X/week   Barriers to discharge        Co-evaluation PT/OT/SLP Co-Evaluation/Treatment: Yes Reason for Co-Treatment: To address functional/ADL transfers PT goals addressed during session: Mobility/safety with mobility;Proper use of DME         AM-PAC PT "6 Clicks" Mobility  Outcome Measure Help needed turning from your back to your side while in a flat bed without using bedrails?: A Little Help needed moving from lying on your back to  sitting on the side of a flat bed without using bedrails?: A Little Help needed moving to and from a bed to a chair (including a wheelchair)?: A Little Help needed standing up from a chair using your arms (e.g., wheelchair or bedside chair)?: A Little Help needed to walk in hospital room?: A Little Help needed climbing 3-5 steps with a railing? : A Lot 6 Click Score: 17    End of Session Equipment Utilized During Treatment: Gait belt Activity Tolerance: Patient tolerated treatment well;No increased pain Patient left: in bed;with call bell/phone within reach Nurse Communication: Mobility status PT Visit Diagnosis: Unsteadiness on feet (R26.81);Other abnormalities of gait and mobility (R26.89);Muscle weakness (generalized) (M62.81);Pain Pain - Right/Left: Right Pain - part of body: Leg    Time: 1100-1025 PT Time Calculation (min) (ACUTE ONLY): 1405 min   Charges:   PT Evaluation $PT Eval Low Complexity: 1 Low PT Treatments $Gait Training: 8-22 mins  11/08/2018   Rande Lawman, PT   Loyal Buba 11/08/2018, 10:52 AM

## 2018-11-08 NOTE — Progress Notes (Addendum)
11/08/18 1053  PT Visit Information  Assistance Needed +1  Reason for Co-Treatment For patient/therapist safety;To address functional/ADL transfers  OT goals addressed during session ADL's and self-care  History of Present Illness 68 y.o. female, with past medical history for chronic diastolic CHF, EF 60 to 93%, CKD stage III, type 2 diabetes, hypertension, still using tobacco, presents to ED secondary to complaints of worsening lower extremity edema extending all the way to her abdomen  Precautions  Precautions Fall  Precaution Comments pt denies falls - but had near fall recently  Restrictions  Weight Bearing Restrictions No  Home Living  Family/patient expects to be discharged to: Private residence  Living Arrangements Spouse/significant other  Available Help at Discharge Family;Available 24 hours/day  Type of Home House  Home Access Stairs to enter  Entrance Stairs-Number of Steps 3  Entrance Stairs-Rails Left  Home Layout One level  Bathroom Shower/Tub Tub/shower unit  Information systems manager - 2 wheels;Cane - single point;Walker - 4 wheels;Grab bars - tub/shower;Shower seat  Additional Comments pt reports she has 2 BSC - one over toilet and one by the bed.  Pt doesnt like her shower chair - feels it is too wobbly.  Pt has 2 wheeled walker but uses her husbands 4 wheeled walker as seh likes it better.  Pts PCP has ordered for her to get power chair for going to MD appts etc.  she has seen someone about specs.  Prior Function  Level of Independence Needs assistance  Gait / Transfers Assistance Needed uses rollator to walk to bathroom at home; supposed to be getting an electric w/c thru [place] / [place] assist with bra, dressing LB; bathes herself in shower; independent toileting/ toilet transfers  Communication / Swallowing Assistance Needed no difficulties  Comments pt tearful - husband just diagnosed with  prostate cancer and she is worried about him and his next steps  Communication  Communication No difficulties  Pain Assessment  Pain Assessment 0-10  Pain Score 5  Pain Location right hip and both knees  Pain Descriptors / Indicators Aching;Discomfort;Sore  Pain Intervention(s) Limited activity within patient's tolerance;Monitored during session  Cognition  Arousal/Alertness Awake/alert  Behavior During Therapy [facility] for tasks assessed/performed  Overall Cognitive Status Within Functional Limits for tasks assessed  General Comments pt tearful when talking about her husband and her declining health.  pt doesnt weigh at home - doesnt know how to change the b atteries in her scales  Upper Extremity Assessment  Upper Extremity Assessment Generalized weakness  Lower Extremity Assessment  Lower Extremity Assessment Defer to PT evaluation  Cervical / Trunk Assessment  Cervical / Trunk Assessment Normal  Bed Mobility  General bed mobility comments pt sitting EOB upon arrival  Transfers  Overall transfer level Needs assistance  Equipment used Rolling walker (2 wheeled)  Transfers Sit to/from Stand  Sit to Stand Min guard;From elevated surface;Min assist  General transfer comment pt sitting EOB when i arrived.  pt needed very high surface to stand - she said her bed at home - allows her feet to dangle  Balance  Overall balance assessment Mild deficits observed, not formally tested  General Comments  General comments (skin integrity, edema, etc.) educated pt on importance of weighing self at home on her scale, pt reports batteries need to be changed;SpO2 >97% throughout session on RA up to 99% RA after ambulation;discussed importance of mobility  Acute Rehab PT Goals  Patient Stated  Goal to be safe at home - to get power chair for going out via [facility];to be home to go with husband to his appointment 11/18/2018  Written Expression  Dominant Hand Left   11/08/2018   Rande Lawman, PT

## 2018-11-08 NOTE — Progress Notes (Signed)
Inpatient Diabetes Program Recommendations  AACE/ADA: New Consensus Statement on Inpatient Glycemic Control (2015)  Target Ranges:  Prepandial:   less than 140 mg/dL      Peak postprandial:   less than 180 mg/dL (1-2 hours)      Critically ill patients:  140 - 180 mg/dL   Lab Results  Component Value Date   GLUCAP 58 (L) 11/08/2018   HGBA1C 8.1 (H) 09/17/2018    Review of Glycemic Control  Diabetes history: DM2 Outpatient Diabetes medications: glipizide 10 mg QAM Current orders for Inpatient glycemic control: Novolog 0-9 units tidwc and 0-5 units QHS  HgbA1C - 8.1%  Inpatient Diabetes Program Recommendations:     D/C glipizide for home.   Pt needs to make lifestyle changes with diet and exercise to control blood sugars.  OP Diabetes Education for diabetes control and weight loss.   Follow.  Thank you. Lorenda Peck, RD, LDN, CDE Inpatient Diabetes Coordinator [phone]

## 2018-11-08 NOTE — Progress Notes (Signed)
Pt with multiple hypoglycemic events during this shift. CBG = 45 @ 9:16pm. 2 orange juices and 32g CHO snack given. Followup CBG = 59. Glutose gel given @ 22:31. Followup CBG = 47. Dextrose given @ 23:38. Followup CBG = 80. Repeat CBG = 75 @ 03:03.  AM CBG = 68. Pt very lethargic and unable to take POs at this time. Dextrose given at 06:00.

## 2018-11-08 NOTE — Progress Notes (Signed)
Hypoglycemic Event  CBG: 58  Treatment: 8 oz juice/soda  Symptoms: None  Follow-up CBG: Time:1305 CBG Result:63  Possible Reasons for Event: Unknown  Comments/MD notified:nurse protocol    Zoe Clarke

## 2018-11-08 NOTE — Progress Notes (Signed)
Occupational Therapy Evaluation Patient Details Name: Zoe Clarke MRN: [id number] DOB: Aug 02, 1950 Today's Date: 11/08/2018    History of Present Illness 68 y.o. female, with past medical history for chronic diastolic CHF, EF 60 to 22%, CKD stage III, type 2 diabetes, hypertension, still using tobacco, presents to ED secondary to complaints of worsening lower extremity edema extending all the way to her abdomen   Clinical Impression   PTA, pt was living at home with her husband, who assisted with LB ADL. Pt reports husband has health issues of his own impacting his ability to safely assist pt. Pt reports she was modified independent with functional mobility at RW level. Pt currently requires minguard-minA for functional mobility at RW level, modA for LB dressing, and minguard-minA for upper body dressing. Due to decline in current level of function, pt would benefit from acute OT to address established goals to facilitate safe D/C to venue listed below. At this time, recommend HHOT follow-up. Will continue to follow acutely.     Follow Up Recommendations  Home health OT;Supervision - Intermittent(with mobility )    Equipment Recommendations  None recommended by OT    Recommendations for Other Services       Precautions / Restrictions Precautions Precautions: Fall Precaution Comments: pt denies falls - but had near fall recently Restrictions Weight Bearing Restrictions: No      Mobility Bed Mobility               General bed mobility comments: pt sitting EOB upon arrival  Transfers Overall transfer level: Needs assistance Equipment used: Rolling walker (2 wheeled) Transfers: Sit to/from Stand Sit to Stand: Min guard;From elevated surface;Min assist         General transfer comment: pt sitting EOB when i arrived.  pt needed very high surface to stand - she said her bed at home - allows her feet to dangle    Balance Overall balance assessment: Mild deficits  observed, not formally tested                                         ADL either performed or assessed with clinical judgement   ADL Overall ADL's : Needs assistance/impaired Eating/Feeding: Set up;Sitting   Grooming: Min guard;Standing   Upper Body Bathing: Min guard;Sitting   Lower Body Bathing: Moderate assistance;Sit to/from stand Lower Body Bathing Details (indicate cue type and reason): modA for access to BLE Upper Body Dressing : Minimal assistance;Sitting Upper Body Dressing Details (indicate cue type and reason): minA to don gown Lower Body Dressing: Moderate assistance Lower Body Dressing Details (indicate cue type and reason): modA for access to BLE;assist to adjust socks Toilet Transfer: Minimal assistance;Min guard;Ambulation Toilet Transfer Details (indicate cue type and reason): simulated;minguard-minA for stability, minA when turning and for powerup  Toileting- Clothing Manipulation and Hygiene: Minimal assistance;Sit to/from stand       Functional mobility during ADLs: Min guard;Rolling walker;Minimal assistance General ADL Comments: minguard with intermittent minA for stability and assist to powerup from surfaces;pt expresses preference for high seat;states she does not sit at doctor's office because seats are too low     Vision         Perception     Praxis      Pertinent Vitals/Pain Pain Assessment: 0-10 Pain Score: 5  Pain Location: right hip and both knees Pain Descriptors / Indicators: Aching;Discomfort;Sore Pain Intervention(s): Limited  activity within patient's tolerance;Monitored during session     Hand Dominance Left   Extremity/Trunk Assessment Upper Extremity Assessment Upper Extremity Assessment: Generalized weakness   Lower Extremity Assessment Lower Extremity Assessment: Defer to PT evaluation   Cervical / Trunk Assessment Cervical / Trunk Assessment: Normal   Communication Communication Communication: No  difficulties   Cognition Arousal/Alertness: Awake/alert Behavior During Therapy: WFL for tasks assessed/performed Overall Cognitive Status: Within Functional Limits for tasks assessed                                 General Comments: pt tearful when talking about her husband and her declining health.  pt doesnt weigh at home - doesnt know how to change the b atteries in her scales   General Comments  educated pt on importance of weighing self at home on her scale, pt reports batteries need to be changed;SpO2 >97% throughout session on RA up to 99% RA after ambulation;discussed importance of mobility    Exercises     Shoulder Instructions      Home Living Family/patient expects to be discharged to:: Private residence Living Arrangements: Spouse/significant other Available Help at Discharge: Family;Available 24 hours/day Type of Home: House Home Access: Stairs to enter [employer] of Steps: 3 Entrance Stairs-Rails: Left Home Layout: One level     Bathroom Shower/Tub: Teacher, early years/pre: Standard     Home Equipment: Bedside commode;Walker - 2 wheels;Cane - single point;Walker - 4 wheels;Grab bars - tub/shower;Shower seat   Additional Comments: pt reports she has 2 BSC - one over toilet and one by the bed.  Pt doesnt like her shower chair - feels it is too wobbly.  Pt has 2 wheeled walker but uses her husbands 4 wheeled walker as seh likes it better.  Pts PCP has ordered for her to get power chair for going to MD appts etc.  she has seen someone about specs.      Prior Functioning/Environment Level of Independence: Needs assistance  Gait / Transfers Assistance Needed: uses rollator to walk to bathroom at home; supposed to be getting an electric w/c thru [place] / Hom[employer] Assistance Needed: assist with bra, dressing LB; bathes herself in shower; independent toileting/ toilet transfers Communication / Swallowing Assistance  Needed: no difficulties Comments: pt tearful - husband just diagnosed with prostate cancer and she is worried about him and his next steps        OT Problem List: Decreased range of motion;Decreased activity tolerance;Impaired balance (sitting and/or standing);Decreased safety awareness;Decreased knowledge of precautions;Decreased knowledge of use of DME or AE;Cardiopulmonary status limiting activity;Pain;Obesity      OT Treatment/Interventions: Self-care/ADL training;Energy conservation;DME and/or AE instruction;Therapeutic activities;Patient/family education;Balance training    OT Goals(Current goals can be found in the care plan section) Acute Rehab OT Goals Patient Stated Goal: to be safe at home - to get power chair for going out via [facility];to be home to go with husband to his appointment 11/18/2018 OT Goal Formulation: With patient Time For Goal Achievement: 11/22/18 Potential to Achieve Goals: Good ADL Goals Pt Will Perform Grooming: with modified independence;sitting;standing Pt Will Perform Upper Body Dressing: with modified independence;standing;sitting Pt Will Perform Lower Body Dressing: with modified independence;with adaptive equipment;sit to/from stand Pt Will Transfer to Toilet: with modified independence;ambulating  OT Frequency: Min 2X/week   Barriers to D/C: Decreased caregiver support  pt reports her husband has health issues of his  own, has difficulty assisting pt safely       Co-evaluation PT/OT/SLP Co-Evaluation/Treatment: Yes Reason for Co-Treatment: For patient/therapist safety;To address functional/ADL transfers PT goals addressed during session: Mobility/safety with mobility;Proper use of DME OT goals addressed during session: ADL's and self-care      AM-PAC OT "6 Clicks" Daily Activity     Outcome Measure Help from another person eating meals?: A Little Help from another person taking care of personal grooming?: A Little Help from another person  toileting, which includes using toliet, bedpan, or urinal?: A Little Help from another person bathing (including washing, rinsing, drying)?: A Little Help from another person to put on and taking off regular upper body clothing?: A Little Help from another person to put on and taking off regular lower body clothing?: A Lot 6 Click Score: 17   End of Session Equipment Utilized During Treatment: Gait belt;Rolling walker Nurse Communication: Mobility status  Activity Tolerance: Patient tolerated treatment well Patient left: in bed;with call bell/phone within reach  OT Visit Diagnosis: Unsteadiness on feet (R26.81);Other abnormalities of gait and mobility (R26.89);Muscle weakness (generalized) (M62.81);Pain Pain - Right/Left: Right Pain - part of body: Hip                Time: 3744-5146 OT Time Calculation (min): 31 min Charges:  OT General Charges $OT Visit: 1 Visit OT Evaluation $OT Eval Moderate Complexity: [place] OTR/L Acute Rehabilitation Services Office: [place] 11/08/2018, 11:03 AM

## 2018-11-08 NOTE — Progress Notes (Signed)
Pt lethargic and unable to stand at this time. Bed weight taken. Will continue to monitor.

## 2018-11-08 NOTE — Progress Notes (Signed)
PROGRESS NOTE    Zoe Clarke  GDJ:[id number] DOB: 21-Aug-1950 DOA: 11/06/2018 PCP: Antony Contras, MD   Brief Narrative: As per HPI 68 y.o. female, with past medical history for chronic diastolic CHF, EF 60 to 62%, CKD stage III, type 2 diabetes, hypertension, still using tobacco, presents to ED secondary to complaints of worsening lower extremity edema extending all the way to her abdomen, as well ports some dyspnea, exertional and mild orthopnea as well, patient reports she has been compliant with no salt diet, her medications, but she has not been compliant with fluid restriction and still smoking, denies any chest pain, fever, chills, nausea, vomiting, dysuria or polyuria. - in ED there is no hypoxia, chest x-ray significant for mild cardiomegaly, saturating 100% on room air, but has significantly elevated proBNP at 1845, distended JVD, elevated creatinine at 2.1, which is better than her baseline which is around 3. Was admitted For further management.  Subjective: hypogycemic events overnight in 40s- now in 340s. Is awake ,alerrt, finished meal. on [place] o2. No chest pain, fever, chills, nausea asking for ct abdomen for abd distension. Some nausea last night  BP goes up and down at home. Per patient normal weight around 239 lb- on admission was 255 ln-->251. Does not use o2 at home- currently on 2 l [place] - reprots she gags from air during sleep (for many years) Does not do salt at home- does not drink soda but lots of gingerale  Assessment & Plan:   Acute on chronic diastolic CHF: With significant volume overload with leg edema weight gain.  Patient not on fluid restriction at home.  Discussed about dietary compliance.  Volume status improving.  Far negative balance 2.4 L.  Weight 255 pounds stable. Continue Lasix 60 mg twice daily with close monitoring of intake output and daily weight.  Creatinine slightly uptrending but close to baseline. monitor renal function closely.  Abdominal  distention with nausea:Patient reports that  PCP was planningto get a CT scan of the abdomen.  This is likely in the setting of #1 will obtain CT abdomen with oral contrast per request.  Diabetes mellitus type 2,with complications: w hypoglycemia- now improved, and ate breakfast.,stop glipizide given CKD. Cont ssi. Recent Labs  Lab 11/07/18 2325 11/08/18 0016 11/08/18 0303 11/08/18 0547 11/08/18 0721  GLUCAP 47* 80 75 68* 342*   CKD stage III baseline creatinine around 3 and appears stable.  Monitor closely. Recent Labs  Lab 11/06/18 1024 11/07/18 0342 11/08/18 0324  BUN 20 22 29*  CREATININE 2.12* 2.20* 2.57*   GERD: Continue PPI.  Anemia of chronic renal disease hemoglobin stable but low monitor.may benefit from Aranesp as o/p. Recent Labs  Lab 11/06/18 1024 11/08/18 0324  HGB 8.3* 8.0*  HCT 28.0* 26.2*    Diabetic neuropathy: On gabapentin.  Current tobacco use: Cessation advised.  Essential (primary) hypertension: Fairly stable today.  Refusing hydralazine.  Continue Coreg, isosorbide.   Body mass index is 38.85 kg/m.    DVT prophylaxis:Heparin Code Status: full Family Communication: plan of care discussed with patient in detail. She lives w husband Disposition Plan: Remains inpatient pending clinical improvement. PT/OTsuggesting home health/24 supervision.  Continues to remain hospitalized for continued IV Lasix. Consultants:  none Procedures: none  Antimicrobials: Anti-infectives (From admission, onward)   None       Objective: Vitals:   11/07/18 2005 11/08/18 0044 11/08/18 0549 11/08/18 0646  BP: (!) 144/59 (!) 148/69 (!) 142/65   Pulse: 65 76 74   Resp:  16 15 15    Temp: 97.6 F (36.4 C) 97.8 F (36.6 C) 97.9 F (36.6 C)   TempSrc: Oral Oral Oral   SpO2: 98% 100% 99%   Weight:    115.9 kg  Height:        Intake/Output Summary (Last 24 hours) at 11/08/2018 0831 Last data filed at 11/08/2018 0646 Gross per 24 hour  Intake 1000 ml   Output 2125 ml  Net -1125 ml   Filed Weights   11/06/18 1623 11/07/18 0300 11/08/18 0646  Weight: 114.6 kg 114 kg 115.9 kg   Weight change: 0.233 kg  Body mass index is 38.85 kg/m.  Intake/Output from previous day: 08/21 0701 - 08/22 0700 In: 1240 [P.O.:1240] Out: 2125 [Urine:2125] Intake/Output this shift: No intake/output data recorded.  Examination:  General exam: Appears calm and comfortable,NAD, on [place],obese.  HEENT:PERRL,Oral mucosa moist, Ear/Nose normal on gross exam. Respiratory system: Bilaterally diminished, no wheezing.   Cardiovascular system: S1 & S2 heard,No JVD, murmurs. Gastrointestinal system: Abdomen is  soft, nontender, obese with large body habitus, very thinck skinfold present  Nervous System:Alert and oriented.  Grossly nonfocal and intact sensation bilaterally.   Extremities: extensive leg edema- chronic appearing,distal peripheral pulses palpable. Skin: No rashes, lesions, no icterus. MSK: Normal muscle bulk,tone,power.  Medications:  Scheduled Meds: . atorvastatin  40 mg Oral Daily  . carvedilol  12.5 mg Oral BID WC  . cyclobenzaprine  5-10 mg Oral QHS  . furosemide  60 mg Intravenous BID  . gabapentin  300 mg Oral TID  . heparin  5,000 Units Subcutaneous Q8H  . hydrALAZINE  75 mg Oral TID  . insulin aspart  0-5 Units Subcutaneous QHS  . insulin aspart  0-9 Units Subcutaneous TID WC  . isosorbide mononitrate  60 mg Oral Daily  . loratadine  10 mg Oral Daily  . pantoprazole  40 mg Oral Daily  . sodium chloride flush  3 mL Intravenous Q12H  . Vitamin D (Ergocalciferol)  50,000 Units Oral Weekly   Continuous Infusions: . sodium chloride      Data Reviewed: I have personally reviewed following labs and imaging studies  CBC: Recent Labs  Lab 11/06/18 1024 11/08/18 0324  WBC 6.7 6.3  NEUTROABS 4.2  --   HGB 8.3* 8.0*  HCT 28.0* 26.2*  MCV 88.9 86.8  PLT 191 161   Basic Metabolic Panel: Recent Labs  Lab 11/06/18 1024 11/07/18  0342 11/08/18 0324  NA 141 141 139  K 4.5 4.5 4.7  CL 112* 107 106  CO2 22 27 24   GLUCOSE 117* 139* 49*  BUN 20 22 29*  CREATININE 2.12* 2.20* 2.57*  CALCIUM 8.4* 8.2* 8.1*   GFR: Estimated Creatinine Clearance: 28.8 mL/min (A) (by C-G formula based on SCr of 2.57 mg/dL (H)). Liver Function Tests: Recent Labs  Lab 11/06/18 1024  AST 13*  ALT 15  ALKPHOS 86  BILITOT 0.5  PROT 6.2*  ALBUMIN 2.9*   No results for input(s): LIPASE, AMYLASE in the last 168 hours. No results for input(s): AMMONIA in the last 168 hours. Coagulation Profile: No results for input(s): INR, PROTIME in the last 168 hours. Cardiac Enzymes: No results for input(s): CKTOTAL, CKMB, CKMBINDEX, TROPONINI in the last 168 hours. BNP (last 3 results) No results for input(s): PROBNP in the last 8760 hours. HbA1C: No results for input(s): HGBA1C in the last 72 hours. CBG: Recent Labs  Lab 11/07/18 2325 11/08/18 0016 11/08/18 0303 11/08/18 0547 11/08/18 0721  GLUCAP 47* 80  75 68* 342*   Lipid Profile: No results for input(s): CHOL, HDL, LDLCALC, TRIG, CHOLHDL, LDLDIRECT in the last 72 hours. Thyroid Function Tests: No results for input(s): TSH, T4TOTAL, FREET4, T3FREE, THYROIDAB in the last 72 hours. Anemia Panel: No results for input(s): VITAMINB12, FOLATE, FERRITIN, TIBC, IRON, RETICCTPCT in the last 72 hours. Sepsis Labs: No results for input(s): PROCALCITON, LATICACIDVEN in the last 168 hours.  Recent Results (from the past 240 hour(s))  SARS Coronavirus 2 [facility] order, Performed in [facility] hospital lab) Nasopharyngeal Nasopharyngeal Swab     Status: None   Collection Time: 11/06/18  1:28 PM   Specimen: Nasopharyngeal Swab  Result Value Ref Range Status   SARS Coronavirus 2 NEGATIVE NEGATIVE Final    Comment: (NOTE) If result is NEGATIVE SARS-CoV-2 target nucleic acids are NOT DETECTED. The SARS-CoV-2 RNA is generally detectable in upper and lower  respiratory specimens during the  acute phase of infection. The lowest  concentration of SARS-CoV-2 viral copies this assay can detect is 250  copies / mL. A negative result does not preclude SARS-CoV-2 infection  and should not be used as the sole basis for treatment or other  patient management decisions.  A negative result may occur with  improper specimen collection / handling, submission of specimen other  than nasopharyngeal swab, presence of viral mutation(s) within the  areas targeted by this assay, and inadequate number of viral copies  (<250 copies / mL). A negative result must be combined with clinical  observations, patient history, and epidemiological information. If result is POSITIVE SARS-CoV-2 target nucleic acids are DETECTED. The SARS-CoV-2 RNA is generally detectable in upper and lower  respiratory specimens dur ing the acute phase of infection.  Positive  results are indicative of active infection with SARS-CoV-2.  Clinical  correlation with patient history and other diagnostic information is  necessary to determine patient infection status.  Positive results do  not rule out bacterial infection or co-infection with other viruses. If result is PRESUMPTIVE POSTIVE SARS-CoV-2 nucleic acids MAY BE PRESENT.   A presumptive positive result was obtained on the submitted specimen  and confirmed on repeat testing.  While 2019 novel coronavirus  (SARS-CoV-2) nucleic acids may be present in the submitted sample  additional confirmatory testing may be necessary for epidemiological  and / or clinical management purposes  to differentiate between  SARS-CoV-2 and other Sarbecovirus currently known to infect humans.  If clinically indicated additional testing with an alternate test  methodology [phone]) is advised. The SARS-CoV-2 RNA is generally  detectable in upper and lower respiratory sp ecimens during the acute  phase of infection. The expected result is Negative. Fact Sheet for Patients:   StrictlyIdeas.no Fact Sheet for Healthcare Providers: BankingDealers.co.za This test is not yet approved or cleared by the [place] FDA and has been authorized for detection and/or diagnosis of SARS-CoV-2 by FDA under an Emergency Use Authorization (EUA).  This EUA will remain in effect (meaning this test can be used) for the duration of the COVID-19 declaration under Section 564(b)(1) of the Act, 21 U.S.C. section 360bbb-3(b)(1), unless the authorization is terminated or revoked sooner. Performed at [facility], [place] [address] [place], [place] [address]       Radiology Studies: Dg Chest 2 View  Result Date: 11/06/2018 CLINICAL DATA:  Leg edema EXAM: CHEST - 2 VIEW COMPARISON:  09/23/2018 FINDINGS: Heart is mildly enlarged. Mild peribronchial thickening. No overt edema. No confluent opacities or effusions. No acute bony abnormality. IMPRESSION:  Mild cardiomegaly. Mild bronchitic changes. Electronically Signed   By: Rolm Baptise M.D.   On: 11/06/2018 11:56      LOS: 2 days   Time spent: More than 50% of that time was spent in counseling and/or coordination of care.  Antonieta Pert, MD [facility]  11/08/2018, 8:31 AM

## 2018-11-09 LAB — GLUCOSE, CAPILLARY
Glucose-Capillary: 126 mg/dL — ABNORMAL HIGH (ref 70–99)
Glucose-Capillary: 107 mg/dL — ABNORMAL HIGH (ref 70–99)
Glucose-Capillary: 155 mg/dL — ABNORMAL HIGH (ref 70–99)
Glucose-Capillary: 167 mg/dL — ABNORMAL HIGH (ref 70–99)

## 2018-11-09 LAB — BASIC METABOLIC PANEL
Anion gap: 8 (ref 5–15)
BUN: 32 mg/dL — ABNORMAL HIGH (ref 8–23)
CO2: 27 mmol/L (ref 22–32)
Calcium: 8.2 mg/dL — ABNORMAL LOW (ref 8.9–10.3)
Chloride: 102 mmol/L (ref 98–111)
Creatinine, Ser: 2.78 mg/dL — ABNORMAL HIGH (ref 0.44–1.00)
GFR calc Af Amer: 20 mL/min — ABNORMAL LOW (ref >60)
GFR calc non Af Amer: 17 mL/min — ABNORMAL LOW (ref >60)
Glucose, Bld: 125 mg/dL — ABNORMAL HIGH (ref 70–99)
Potassium: 4.7 mmol/L (ref 3.5–5.1)
Sodium: 137 mmol/L (ref 135–145)

## 2018-11-09 MED ORDER — HYDROXYZINE HCL 10 MG PO TABS
10.0000 mg | ORAL_TABLET | Freq: Three times a day (TID) | ORAL | Status: DC | PRN
  Administered 2018-11-09 – 2018-11-11 (×4): 10 mg via ORAL
  Filled 2018-11-09 (×5): qty 1

## 2018-11-09 MED ORDER — FUROSEMIDE 10 MG/ML IJ SOLN
40.0000 mg | Freq: Two times a day (BID) | INTRAMUSCULAR | Status: DC
  Administered 2018-11-09 (×2): 40 mg via INTRAVENOUS
  Filled 2018-11-09 (×3): qty 4

## 2018-11-09 NOTE — Progress Notes (Signed)
PROGRESS NOTE    Zoe Clarke  VCB:[id number] DOB: [birth year] DOA: 11/06/2018 PCP: Antony Contras, MD   Brief Narrative: As per HPI 68 y.o. female, with past medical history for chronic diastolic CHF, EF 60 to 38%, CKD stage III, type 2 diabetes, hypertension, still using tobacco, presents to ED secondary to complaints of worsening lower extremity edema extending all the way to her abdomen, as well ports some dyspnea, exertional and mild orthopnea as well, patient reports she has been compliant with no salt diet, her medications, but she has not been compliant with fluid restriction and still smoking, denies any chest pain, fever, chills, nausea, vomiting, dysuria or polyuria. - in ED there is no hypoxia, chest x-ray significant for mild cardiomegaly, saturating 100% on room air, but has significantly elevated proBNP at 1845, distended JVD, elevated creatinine at 2.1, which is better than her baseline which is around 3. Was admitted For further management.  Subjective: Seen and examined.  Complains of itching number on back from shampoo/shower issues. Sugar was in 50s and placed on low-dose dextrose overnight-restart this morning.  Sacral swelling is improving in the leg and abdomen.  BP goes up and down at home. Per patient normal weight around 239 lb- on admission was 255 ln-->252. Does not use o2 at home- currently on 2 l [place] - reprots she gags from air during sleep (for many years) Does not do salt at home- does not drink soda but lots of gingerale  Assessment & Plan:   Acute on chronic diastolic CHF: With significant volume overload with leg edema/abdominal edema/weight gain. Patient not on fluid restriction at home.  Discussed about dietary compliance.  Volume status improving continue Lasix to 40 twice daily as creatinine slightly uptrending.  Abdominal swelling and leg edema improving.  Weight downtrending as above. Monitor renal function closely.  Abdominal distention with  nausea: Due to anasarca.  Otherwise unremarkable CT abdomen  Diabetes mellitus type 2,with complications: w hypoglycemia likely from glipizide in the setting of renal dysfunction. D/C dextrose fluid. Cont to monitor Accu-Chek and continue PRN sliding scale insulin. Recent Labs  Lab 11/08/18 1304 11/08/18 1329 11/08/18 1641 11/08/18 2055 11/09/18 0611  GLUCAP 63* 125* 78 75 126*   CKD stage III baseline creatinine around 3 and appears stable.  Monitor closely. Recent Labs  Lab 11/06/18 1024 11/07/18 0342 11/08/18 0324 11/09/18 0454  BUN 20 22 29* 32*  CREATININE 2.12* 2.20* 2.57* 2.78*   GERD: Continue PPI.  Anemia of chronic renal disease hemoglobin stable but low monitor.may benefit from Aranesp as o/p. Recent Labs  Lab 11/06/18 1024 11/08/18 0324  HGB 8.3* 8.0*  HCT 28.0* 26.2*    Diabetic neuropathy: On gabapentin.  Current tobacco use: Cessation advised.  Essential (primary) hypertension: Fairly stablContinue Coreg, isosorbide, hydralazine.   Body mass index is 38.41 kg/m.    DVT prophylaxis:Heparin Code Status: full Family Communication: plan of care discussed with patient in detail. She lives w husband Disposition Plan: Remains inpatient pending clinical improvement. PT/OTsuggesting home health/24 supervision.  Continues to remain hospitalized for continued IV Lasix.  Consultants:  none Procedures: none  Antimicrobials: Anti-infectives (From admission, onward)   None       Objective: Vitals:   11/08/18 1704 11/08/18 1922 11/08/18 2220 11/09/18 0536  BP: (!) 175/81 (!) 132/58 (!) 146/56 (!) 148/74  Pulse: 78 81 69 82  Resp:  18    Temp: 98.6 F (37 C) 97.9 F (36.6 C)  98.1 F (36.7 C)  TempSrc:  Oral Oral  Oral  SpO2: 100% 95%  99%  Weight:    114.6 kg  Height:        Intake/Output Summary (Last 24 hours) at 11/09/2018 1014 Last data filed at 11/09/2018 0820 Gross per 24 hour  Intake 3488.46 ml  Output 2250 ml  Net 1238.46 ml    Filed Weights   11/07/18 0300 11/08/18 0646 11/09/18 0536  Weight: 114 kg 115.9 kg 114.6 kg   Weight change: -1.3 kg  Body mass index is 38.41 kg/m.  Intake/Output from previous day: 08/22 0701 - 08/23 0700 In: 3488.5 [P.O.:3096; I.V.:392.5] Out: 2250 [Urine:2250] Intake/Output this shift: Total I/O In: 240 [P.O.:240] Out: -   Examination:  General exam: Appears calm and comfortable,NAD, on [place],obese.  HEENT:PERRL,Oral mucosa moist, Ear/Nose normal on gross exam. Respiratory system: Bilaterally diminished, no wheezing.   Cardiovascular system: S1 & S2 heard,No JVD, murmurs. Gastrointestinal system: Abdomen is  soft, nontender, obese with large body habitus, very thinck skinfold present  Nervous System:Alert and oriented.  Grossly nonfocal and intact sensation bilaterally.   Extremities: extensive leg edema- chronic appearing,distal peripheral pulses palpable. Skin: No rashes, lesions, no icterus. MSK: Normal muscle bulk,tone,power.  Medications:  Scheduled Meds: . atorvastatin  40 mg Oral Daily  . carvedilol  12.5 mg Oral BID WC  . cyclobenzaprine  5-10 mg Oral QHS  . furosemide  40 mg Intravenous BID  . gabapentin  300 mg Oral TID  . heparin  5,000 Units Subcutaneous Q8H  . hydrALAZINE  75 mg Oral TID  . insulin aspart  0-5 Units Subcutaneous QHS  . insulin aspart  0-9 Units Subcutaneous TID WC  . isosorbide mononitrate  60 mg Oral Daily  . living well with diabetes book   Does not apply Once  . loratadine  10 mg Oral Daily  . pantoprazole  40 mg Oral Daily  . sodium chloride flush  3 mL Intravenous Q12H  . Vitamin D (Ergocalciferol)  50,000 Units Oral Weekly   Continuous Infusions: . sodium chloride      Data Reviewed: I have personally reviewed following labs and imaging studies  CBC: Recent Labs  Lab 11/06/18 1024 11/08/18 0324  WBC 6.7 6.3  NEUTROABS 4.2  --   HGB 8.3* 8.0*  HCT 28.0* 26.2*  MCV 88.9 86.8  PLT 191 712   Basic Metabolic Panel:  Recent Labs  Lab 11/06/18 1024 11/07/18 0342 11/08/18 0324 11/09/18 0454  NA 141 141 139 137  K 4.5 4.5 4.7 4.7  CL 112* 107 106 102  CO2 22 27 24 27   GLUCOSE 117* 139* 49* 125*  BUN 20 22 29* 32*  CREATININE 2.12* 2.20* 2.57* 2.78*  CALCIUM 8.4* 8.2* 8.1* 8.2*   GFR: Estimated Creatinine Clearance: 26.5 mL/min (A) (by C-G formula based on SCr of 2.78 mg/dL (H)). Liver Function Tests: Recent Labs  Lab 11/06/18 1024  AST 13*  ALT 15  ALKPHOS 86  BILITOT 0.5  PROT 6.2*  ALBUMIN 2.9*   No results for input(s): LIPASE, AMYLASE in the last 168 hours. No results for input(s): AMMONIA in the last 168 hours. Coagulation Profile: No results for input(s): INR, PROTIME in the last 168 hours. Cardiac Enzymes: No results for input(s): CKTOTAL, CKMB, CKMBINDEX, TROPONINI in the last 168 hours. BNP (last 3 results) No results for input(s): PROBNP in the last 8760 hours. HbA1C: No results for input(s): HGBA1C in the last 72 hours. CBG: Recent Labs  Lab 11/08/18 1304 11/08/18 1329 11/08/18 1641 11/08/18  2055 11/09/18 0611  GLUCAP 63* 125* 78 75 126*   Lipid Profile: No results for input(s): CHOL, HDL, LDLCALC, TRIG, CHOLHDL, LDLDIRECT in the last 72 hours. Thyroid Function Tests: No results for input(s): TSH, T4TOTAL, FREET4, T3FREE, THYROIDAB in the last 72 hours. Anemia Panel: No results for input(s): VITAMINB12, FOLATE, FERRITIN, TIBC, IRON, RETICCTPCT in the last 72 hours. Sepsis Labs: No results for input(s): PROCALCITON, LATICACIDVEN in the last 168 hours.  Recent Results (from the past 240 hour(s))  SARS Coronavirus 2 [facility] order, Performed in [facility] hospital lab) Nasopharyngeal Nasopharyngeal Swab     Status: None   Collection Time: 11/06/18  1:28 PM   Specimen: Nasopharyngeal Swab  Result Value Ref Range Status   SARS Coronavirus 2 NEGATIVE NEGATIVE Final    Comment: (NOTE) If result is NEGATIVE SARS-CoV-2 target nucleic acids are NOT DETECTED. The  SARS-CoV-2 RNA is generally detectable in upper and lower  respiratory specimens during the acute phase of infection. The lowest  concentration of SARS-CoV-2 viral copies this assay can detect is 250  copies / mL. A negative result does not preclude SARS-CoV-2 infection  and should not be used as the sole basis for treatment or other  patient management decisions.  A negative result may occur with  improper specimen collection / handling, submission of specimen other  than nasopharyngeal swab, presence of viral mutation(s) within the  areas targeted by this assay, and inadequate number of viral copies  (<250 copies / mL). A negative result must be combined with clinical  observations, patient history, and epidemiological information. If result is POSITIVE SARS-CoV-2 target nucleic acids are DETECTED. The SARS-CoV-2 RNA is generally detectable in upper and lower  respiratory specimens dur ing the acute phase of infection.  Positive  results are indicative of active infection with SARS-CoV-2.  Clinical  correlation with patient history and other diagnostic information is  necessary to determine patient infection status.  Positive results do  not rule out bacterial infection or co-infection with other viruses. If result is PRESUMPTIVE POSTIVE SARS-CoV-2 nucleic acids MAY BE PRESENT.   A presumptive positive result was obtained on the submitted specimen  and confirmed on repeat testing.  While 2019 novel coronavirus  (SARS-CoV-2) nucleic acids may be present in the submitted sample  additional confirmatory testing may be necessary for epidemiological  and / or clinical management purposes  to differentiate between  SARS-CoV-2 and other Sarbecovirus currently known to infect humans.  If clinically indicated additional testing with an alternate test  methodology [phone]) is advised. The SARS-CoV-2 RNA is generally  detectable in upper and lower respiratory sp ecimens during the acute   phase of infection. The expected result is Negative. Fact Sheet for Patients:  StrictlyIdeas.no Fact Sheet for Healthcare Providers: BankingDealers.co.za This test is not yet approved or cleared by the [place] FDA and has been authorized for detection and/or diagnosis of SARS-CoV-2 by FDA under an Emergency Use Authorization (EUA).  This EUA will remain in effect (meaning this test can be used) for the duration of the COVID-19 declaration under Section 564(b)(1) of the Act, 21 U.S.C. section 360bbb-3(b)(1), unless the authorization is terminated or revoked sooner. Performed at [facility], [place] [address], [place], [place] [address]       Radiology Studies: Ct Abdomen Pelvis Wo Contrast  Result Date: 11/08/2018 CLINICAL DATA:  Abdominal distension EXAM: CT ABDOMEN AND PELVIS WITHOUT CONTRAST TECHNIQUE: Multidetector CT imaging of the abdomen and pelvis was performed following the standard  protocol without IV contrast. Oral enteric contrast was administered. COMPARISON:  04/10/2018 FINDINGS: Lower chest: No acute abnormality. Bandlike scarring or atelectasis of the bilateral lung bases. Hepatobiliary: No solid liver abnormality is seen. Mildly distended gallbladder containing dependent layering calcific sludge. No gallbladder wall thickening. No biliary ductal dilatation. Pancreas: Unremarkable. No pancreatic ductal dilatation or surrounding inflammatory changes. Spleen: Normal in size without significant abnormality. Adrenals/Urinary Tract: Adrenal glands are unremarkable. Kidneys are normal, without renal calculi, solid lesion, or hydronephrosis. Bladder is unremarkable. Stomach/Bowel: Stomach is within normal limits. Appendix appears normal. No evidence of bowel wall thickening, distention, or inflammatory changes. Occasional sigmoid diverticula. Vascular/Lymphatic: Aortic atherosclerosis. No enlarged abdominal or pelvic lymph nodes.  Reproductive: Calcified uterine fibroids. Other: Anasarca.  No abdominopelvic ascites. Musculoskeletal: No acute or significant osseous findings. IMPRESSION: 1. No non-contrast CT findings of the abdomen or pelvis to explain abdominal distension. No evidence of bowel obstruction. 2.  Anasarca. 3. Mildly distended gallbladder containing dependent layering calcific sludge. No gallbladder wall thickening. No biliary ductal dilatation. 4.  Other chronic and incidental findings as detailed above. Electronically Signed   By: Eddie Candle M.D.   On: 11/08/2018 18:43      LOS: 3 days   Time spent: More than 50% of that time was spent in counseling and/or coordination of care.  Antonieta Pert, MD [facility]  11/09/2018, 10:14 AM

## 2018-11-09 NOTE — Plan of Care (Signed)

## 2018-11-09 NOTE — Progress Notes (Signed)
This encounter was created in error - please disregard.

## 2018-11-10 DIAGNOSIS — K219 Gastro-esophageal reflux disease without esophagitis: Secondary | ICD-10-CM

## 2018-11-10 DIAGNOSIS — I10 Essential (primary) hypertension: Secondary | ICD-10-CM

## 2018-11-10 DIAGNOSIS — I5043 Acute on chronic combined systolic (congestive) and diastolic (congestive) heart failure: Secondary | ICD-10-CM

## 2018-11-10 LAB — BASIC METABOLIC PANEL
Anion gap: 9 (ref 5–15)
BUN: 36 mg/dL — ABNORMAL HIGH (ref 8–23)
CO2: 28 mmol/L (ref 22–32)
Calcium: 8.2 mg/dL — ABNORMAL LOW (ref 8.9–10.3)
Chloride: 99 mmol/L (ref 98–111)
Creatinine, Ser: 3.29 mg/dL — ABNORMAL HIGH (ref 0.44–1.00)
GFR calc Af Amer: 16 mL/min — ABNORMAL LOW (ref >60)
GFR calc non Af Amer: 14 mL/min — ABNORMAL LOW (ref >60)
Glucose, Bld: 159 mg/dL — ABNORMAL HIGH (ref 70–99)
Potassium: 5.2 mmol/L — ABNORMAL HIGH (ref 3.5–5.1)
Sodium: 136 mmol/L (ref 135–145)

## 2018-11-10 LAB — GLUCOSE, CAPILLARY
Glucose-Capillary: 163 mg/dL — ABNORMAL HIGH (ref 70–99)
Glucose-Capillary: 187 mg/dL — ABNORMAL HIGH (ref 70–99)
Glucose-Capillary: 200 mg/dL — ABNORMAL HIGH (ref 70–99)
Glucose-Capillary: 207 mg/dL — ABNORMAL HIGH (ref 70–99)

## 2018-11-10 NOTE — TOC Initial Note (Signed)
Transition of Care [facility]) - Initial/Assessment Note    Patient Details  Name: Zoe Clarke MRN: [id number] Date of Birth: April 23, 1950  Transition of [facility]) CM/SW Contact:    Bethena Roys, RN Phone Number: 11/10/2018, 4:17 PM  Clinical Narrative: Pt presented for CHF- PTA- from home with spouse. Pt has used [facility] in the past and wants to use again. CM did make referral with [facility]- SOC to begin within 24-48 hours post transition home. Pt uses delivery pharmacy with [facility] and she gets to her appointments without any issues. Pt has DME rolling walker, manual wheelchair, and shower chair. No further needs from CM at this time.               Expected Discharge Plan: [place] Barriers to Discharge: No Barriers Identified   Patient Goals and CMS Choice Patient states their goals for this hospitalization and ongoing recovery are:: "to return home with husband" CMS Medicare.gov Compare Post Acute Care list provided to:: Patient Choice offered to / list presented to : Patient  Expected Discharge Plan and Services Expected Discharge Plan: [place] In-house Referral: NA Discharge Planning Services: CM Consult Post Acute Care Choice: [place] arrangements for the past 2 months: Single Family Home                   HH Arranged: RN, Disease Management, PT, OT [place] Agency: [facility]) Date HH Agency Contacted: 11/10/18 Time HH Agency Contacted: 1612 Representative spoke with at [place] Agency: [place] Arrangements/Services Living arrangements for the past 2 months: [place] with:: Spouse Patient language and need for interpreter reviewed:: Yes Do you feel safe going back to the place where you live?: Yes      Need for Family Participation in Patient Care: Yes (Comment) Care giver support system in place?: Yes (comment) Current home services: DME(Pt has DME,  RW, manuel wheelchair, shower chair) Criminal Activity/Legal Involvement Pertinent to Current Situation/Hospitalization: No - Comment as needed  Activities of Daily Living Home Assistive Devices/Equipment: None ADL Screening (condition at time of admission) Patient's cognitive ability adequate to safely complete daily activities?: Yes Is the patient deaf or have difficulty hearing?: No Does the patient have difficulty seeing, even when wearing glasses/contacts?: No Does the patient have difficulty concentrating, remembering, or making decisions?: No Patient able to express need for assistance with ADLs?: Yes Does the patient have difficulty dressing or bathing?: No Independently performs ADLs?: No Does the patient have difficulty walking or climbing stairs?: No Weakness of Legs: None Weakness of Arms/Hands: None  Permission Sought/Granted Permission sought to share information with : Family Supports, Investment banker, corporate granted to share info w AGENCY: [facility]        Emotional Assessment Appearance:: Appears stated age Attitude/Demeanor/Rapport: Engaged Affect (typically observed): Accepting Orientation: : Oriented to Self, Oriented to Place, Oriented to  Time, Oriented to Situation Alcohol / Substance Use: Not Applicable Psych Involvement: No (comment)  Admission diagnosis:  Leg edema [R60.0] Acute on chronic combined systolic and diastolic congestive heart failure ([place]) [I50.43] Patient Active Problem List   Diagnosis Date Noted  . Acute on chronic diastolic CHF (congestive heart failure) ([place]) 11/06/2018  . 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 Thyroid nodule 09/22/2018  .  Anemia, chronic renal failure, stage 3 (moderate) ([place]) 09/22/2018  . Sleep apnea 09/22/2018  . Precordial chest pain   . Symptomatic  cholelithiasis 07/30/2018  . Acute on chronic combined systolic (congestive) and diastolic (congestive) heart failure ([place]) 07/29/2018  . Dyspnea 07/29/2018  . Tobacco abuse counseling 04/27/2018  . Acute pyelonephritis 04/10/2018  . Hypertensive urgency 04/10/2018  . Leukocytosis 09/10/2017  . Anemia 09/10/2017  . Knee pain, bilateral 01/13/2015  . Bilateral hip pain 11/23/2014  . Diabetes mellitus type 2, uncontrolled, with complications ([place]) 62/44/6950  . Intertrigo 10/25/2014  . GERD (gastroesophageal reflux disease) 10/25/2014  . Diabetic neuropathy ([place]) 10/25/2014  . Chronic low back pain 10/25/2014  . Current tobacco use 11/11/2013  . Stage 3 chronic kidney disease due to type 2 diabetes mellitus ([place]) 09/02/2013  . Essential (primary) hypertension 09/02/2013  . Gout 12/05/2012   PCP:  Antony Contras, MD Pharmacy:   [facility], [place] [address] Phone: [phone] Fax: [phone]     Social Determinants of Health (SDOH) Interventions    Readmission Risk Interventions Readmission Risk Prevention Plan 11/10/2018 09/30/2018  Transportation Screening Complete -  [place] or [place] Complete Complete  Social Work Consult for [place] Planning/Counseling Complete -  Palliative Care Screening Not Applicable Not Applicable  Medication Review (RN Care Manager) Complete -  Some recent data might be hidden

## 2018-11-10 NOTE — Care Management Important Message (Signed)
Important Message  Patient Details  Name: Zoe Clarke MRN: [id number] Date of Birth: Jul 23, 1950   Medicare Important Message Given:  Yes     Shelda Altes 11/10/2018, 12:58 PM

## 2018-11-10 NOTE — Plan of Care (Signed)

## 2018-11-10 NOTE — Progress Notes (Signed)
PROGRESS NOTE    Zoe Clarke  HER:[id number] DOB: 1950/05/19 DOA: 11/06/2018 PCP: Antony Contras, MD    Brief Narrative:  68 year old female who presented with worsening lower extremity edema.  She does have significant past medical history for diastolic heart failure, chronic kidney disease stage III, type 2 diabetes mellitus, hypertension or tobacco abuse.  Reported lower extremity edema, associated with dyspnea and orthopnea.  On her initial physical examination her blood pressure was 175/79, heart rate 69, temperature 98.6, respirate 17, oxygen saturation 100%, her lungs were positive for bibasilar Rales, heart S1-S2 present and rhythmic, abdomen soft and distended, positive lower extremity edema. Sodium 141, potassium 4.5, chloride 22, glucose 117, BUN 20, creatinine 2.12, BNP 1845.  Chest x-ray with hilar vascular congestion.  EKG 75 bpm, normal axis, normal intervals, sinus rhythm, no ST segment or T wave changes, poor R wave progression.  Patient was admitted to the hospital with a working diagnosis of decompensated heart failure.  Assessment & Plan:   Principal Problem:   Acute on chronic diastolic CHF (congestive heart failure) (HCC) Active Problems:   Diabetes mellitus type 2, uncontrolled, with complications (HCC)   GERD (gastroesophageal reflux disease)   Diabetic neuropathy (HCC)   Current tobacco use   Essential (primary) hypertension   Anemia, chronic renal failure, stage 3 (moderate) ([place])   1. Acute on chronic diastolic heart failure. Patient continue to improve but not yet back to baseline. Urine out documented over last 24 H is 2.000 ml. Blood pressure has been stable at 134 mmHg. Will continue heart failure management with carvedilol, hydralazine and isosorbide. Hold on furosemide for today due to worsening renal function.   2. AKI on CKD stage IV. Today with continue rising serum cr at 3,29 from 2,78, with K at 5,2 and serum bicarbonate at 28. Will hold on  furosemide for today and follow renal panel in am.   3. T2DM. Will continue glucose cover and monitoring with insulin sliding scale, patient is tolerating po well.   4. HTN. Continue blood pressure control. With hydralazine and isosorbide.   5. Dyslipidemia. Continue with atorvastatin.   6. Obesity. Calculated BMI is 39,05  DVT prophylaxis: heparin   Code Status:  Full  Family Communication: no family at the bedside  Disposition Plan/ discharge barriers: pending renal function improving, possible dc in am.   Body mass index is 39.05 kg/m. Malnutrition Type:      Malnutrition Characteristics:      Nutrition Interventions:     RN Pressure Injury Documentation:     Consultants:     Procedures:     Antimicrobials:       Subjective: Dyspnea and lower extremity edema continue to improve but not yet back to baseline, positive pruritus at the inguinal and back area. Continue to be very weak and deconditioned. No nausea or vomiting.   Objective: Vitals:   11/10/18 0432 11/10/18 0621 11/10/18 0831 11/10/18 0832  BP: (!) 161/72 140/62 (!) 156/75   Pulse: 77 90 91 95  Resp: 18     Temp: 98 F (36.7 C)     TempSrc:      SpO2: 100%  100% 100%  Weight: 116.5 kg     Height:        Intake/Output Summary (Last 24 hours) at 11/10/2018 0910 Last data filed at 11/10/2018 0859 Gross per 24 hour  Intake 1200 ml  Output 2000 ml  Net -800 ml   Filed Weights   11/08/18 0646 11/09/18 0536  11/10/18 0432  Weight: 115.9 kg 114.6 kg 116.5 kg    Examination:   General: deconditioned  Neurology: Awake and alert, non focal  E ENT: mild pallor, no icterus, oral mucosa moist Cardiovascular: No JVD. S1-S2 present, rhythmic, no gallops, rubs, or murmurs. + pitting bilateral ankle lower extremity edema. Pulmonary: vesicular breath sounds bilaterally, decreased air movement, no wheezing, rhonchi or rales. Gastrointestinal. Abdomen protuberant with no organomegaly, non  tender, no rebound or guarding Skin. No rashes Musculoskeletal: no joint deformities     Data Reviewed: I have personally reviewed following labs and imaging studies  CBC: Recent Labs  Lab 11/06/18 1024 11/08/18 0324  WBC 6.7 6.3  NEUTROABS 4.2  --   HGB 8.3* 8.0*  HCT 28.0* 26.2*  MCV 88.9 86.8  PLT 191 858   Basic Metabolic Panel: Recent Labs  Lab 11/06/18 1024 11/07/18 0342 11/08/18 0324 11/09/18 0454 11/10/18 0711  NA 141 141 139 137 136  K 4.5 4.5 4.7 4.7 5.2*  CL 112* 107 106 102 99  CO2 22 27 24 27 28   GLUCOSE 117* 139* 49* 125* 159*  BUN 20 22 29* 32* 36*  CREATININE 2.12* 2.20* 2.57* 2.78* 3.29*  CALCIUM 8.4* 8.2* 8.1* 8.2* 8.2*   GFR: Estimated Creatinine Clearance: 22.5 mL/min (A) (by C-G formula based on SCr of 3.29 mg/dL (H)). Liver Function Tests: Recent Labs  Lab 11/06/18 1024  AST 13*  ALT 15  ALKPHOS 86  BILITOT 0.5  PROT 6.2*  ALBUMIN 2.9*   No results for input(s): LIPASE, AMYLASE in the last 168 hours. No results for input(s): AMMONIA in the last 168 hours. Coagulation Profile: No results for input(s): INR, PROTIME in the last 168 hours. Cardiac Enzymes: No results for input(s): CKTOTAL, CKMB, CKMBINDEX, TROPONINI in the last 168 hours. BNP (last 3 results) No results for input(s): PROBNP in the last 8760 hours. HbA1C: No results for input(s): HGBA1C in the last 72 hours. CBG: Recent Labs  Lab 11/09/18 0611 11/09/18 1217 11/09/18 1633 11/09/18 2059 11/10/18 0624  GLUCAP 126* 107* 155* 167* 163*   Lipid Profile: No results for input(s): CHOL, HDL, LDLCALC, TRIG, CHOLHDL, LDLDIRECT in the last 72 hours. Thyroid Function Tests: No results for input(s): TSH, T4TOTAL, FREET4, T3FREE, THYROIDAB in the last 72 hours. Anemia Panel: No results for input(s): VITAMINB12, FOLATE, FERRITIN, TIBC, IRON, RETICCTPCT in the last 72 hours.    Radiology Studies: I have reviewed all of the imaging during this hospital visit personally      Scheduled Meds: . atorvastatin  40 mg Oral Daily  . carvedilol  12.5 mg Oral BID WC  . cyclobenzaprine  5-10 mg Oral QHS  . gabapentin  300 mg Oral TID  . heparin  5,000 Units Subcutaneous Q8H  . hydrALAZINE  75 mg Oral TID  . insulin aspart  0-5 Units Subcutaneous QHS  . insulin aspart  0-9 Units Subcutaneous TID WC  . isosorbide mononitrate  60 mg Oral Daily  . living well with diabetes book   Does not apply Once  . loratadine  10 mg Oral Daily  . pantoprazole  40 mg Oral Daily  . sodium chloride flush  3 mL Intravenous Q12H  . Vitamin D (Ergocalciferol)  50,000 Units Oral Weekly   Continuous Infusions: . sodium chloride       LOS: 4 days        Rupa Lagan Gerome Apley, MD

## 2018-11-11 LAB — GLUCOSE, CAPILLARY
Glucose-Capillary: 175 mg/dL — ABNORMAL HIGH (ref 70–99)
Glucose-Capillary: 196 mg/dL — ABNORMAL HIGH (ref 70–99)
Glucose-Capillary: 178 mg/dL — ABNORMAL HIGH (ref 70–99)

## 2018-11-11 LAB — BASIC METABOLIC PANEL
Anion gap: 5 (ref 5–15)
BUN: 43 mg/dL — ABNORMAL HIGH (ref 8–23)
CO2: 28 mmol/L (ref 22–32)
Calcium: 8.2 mg/dL — ABNORMAL LOW (ref 8.9–10.3)
Chloride: 102 mmol/L (ref 98–111)
Creatinine, Ser: 3.31 mg/dL — ABNORMAL HIGH (ref 0.44–1.00)
GFR calc Af Amer: 16 mL/min — ABNORMAL LOW (ref >60)
GFR calc non Af Amer: 14 mL/min — ABNORMAL LOW (ref >60)
Glucose, Bld: 177 mg/dL — ABNORMAL HIGH (ref 70–99)
Potassium: 5 mmol/L (ref 3.5–5.1)
Sodium: 135 mmol/L (ref 135–145)

## 2018-11-11 MED ORDER — HYDROCORTISONE 1 % EX CREA
TOPICAL_CREAM | Freq: Three times a day (TID) | CUTANEOUS | Status: DC
  Administered 2018-11-11: 10:00:00 via TOPICAL
  Filled 2018-11-11: qty 28

## 2018-11-11 MED ORDER — FUROSEMIDE 40 MG PO TABS: 40.0000 mg | ORAL_TABLET | Freq: Two times a day (BID) | ORAL | 0 refills | Status: DC

## 2018-11-11 NOTE — Progress Notes (Signed)
Physical Therapy Treatment Patient Details Name: Zoe Clarke MRN: [id number] DOB: 1950-09-29 Today's Date: 11/11/2018    History of Present Illness 68 y.o. female, with past medical history for chronic diastolic CHF, EF 60 to 81%, CKD stage III, type 2 diabetes, hypertension, still using tobacco, presents to ED secondary to complaints of worsening lower extremity edema extending all the way to her abdomen    PT Comments    Pt sitting EOB on arrival with nursing present. Pt able to stand from elevated surface and walk limited hall distance today. Pt however noted to have slow processing, perseveration, decreased problem solving and attention with progression of session. Discussed mental status with RN who reports this is consistent with pt presentation today but unsure of baseline. With impaired cognition pt would certainly need increased supervision at home.  Will continue to follow.     Follow Up Recommendations  Home health PT;Supervision for mobility/OOB     Equipment Recommendations  None recommended by PT    Recommendations for Other Services       Precautions / Restrictions Precautions Precautions: Fall    Mobility  Bed Mobility Overal bed mobility: Needs Assistance Bed Mobility: Sit to Supine       Sit to supine: Mod assist   General bed mobility comments: pt sitting EOB on arrival with mod assist to elevate legs to return to supine with increased time, cues and trendelenburg to slide toward [facility]  Transfers Overall transfer level: Needs assistance   Transfers: Sit to/from Stand Sit to Stand: Supervision         General transfer comment: supervision to rise and sit EOB x 2 during session with elevated bed height simulating home height  Ambulation/Gait Ambulation/Gait assistance: Min guard Gait Distance (Feet): 75 Feet Assistive device: Rolling walker (2 wheeled) Gait Pattern/deviations: Step-through pattern;Decreased stride length;Trunk flexed    Gait velocity interpretation: 1.31 - 2.62 ft/sec, indicative of limited community ambulator General Gait Details: pt initially only agreeable to walking in room 20' and after seated rest agreeable to 2nd trial of 75'. pt reports fatigue and knee pain limiting function with cues for posture   Stairs             Wheelchair Mobility    Modified Rankin (Stroke Patients Only)       Balance Overall balance assessment: Mild deficits observed, not formally tested                                          Cognition Arousal/Alertness: Awake/alert Behavior During Therapy: WFL for tasks assessed/performed Overall Cognitive Status: Impaired/Different from baseline Area of Impairment: Memory;Problem solving;Orientation                 Orientation Level: Disoriented to;Situation           Problem Solving: Slow processing General Comments: pt initially quick to respond but with progression for session pt noted to be tangential, delayed processing and unable to consistently answer questions asked. Pt oriented but not to situation but able to spell word world backward. Pt also initially able to state tech's name with walking and within 50' could not recall      Exercises General Exercises - Lower Extremity Long Arc Quad: AROM;Both;10 reps;Seated    General Comments        Pertinent Vitals/Pain Pain Score: 3  Pain Location: knees bil Pain Descriptors / Indicators:  Aching;Sore Pain Intervention(s): Limited activity within patient's tolerance;Monitored during session;Repositioned    Home Living                      Prior Function            PT Goals (current goals can now be found in the care plan section) Progress towards PT goals: Progressing toward goals    Frequency           PT Plan Current plan remains appropriate    Co-evaluation              AM-PAC PT "6 Clicks" Mobility   Outcome Measure  Help needed turning  from your back to your side while in a flat bed without using bedrails?: A Little Help needed moving from lying on your back to sitting on the side of a flat bed without using bedrails?: A Little Help needed moving to and from a bed to a chair (including a wheelchair)?: A Little Help needed standing up from a chair using your arms (e.g., wheelchair or bedside chair)?: A Little Help needed to walk in hospital room?: A Little Help needed climbing 3-5 steps with a railing? : A Lot 6 Click Score: 17    End of Session   Activity Tolerance: Patient tolerated treatment well Patient left: in bed;with call bell/phone within reach Nurse Communication: Mobility status;Other (comment)(change in mental status from what is reported in last P.T. note) PT Visit Diagnosis: Unsteadiness on feet (R26.81);Other abnormalities of gait and mobility (R26.89);Muscle weakness (generalized) (M62.81);Pain     Time: 2505-3976 PT Time Calculation (min) (ACUTE ONLY): 28 min  Charges:  $Gait Training: 8-22 mins $Therapeutic Activity: 8-22 mins                     Previn Jian Pam Drown, PT Acute Rehabilitation Services Pager: [phone] Office: [place] 11/11/2018, 1:40 PM

## 2018-11-11 NOTE — Discharge Summary (Addendum)
Physician Discharge Summary  Zoe Clarke MPN:[id number] DOB: 28-Oct-1950 DOA: 11/06/2018  PCP: Antony Contras, MD  Admit date: 11/06/2018 Discharge date: 11/11/2018  Admitted From: Home  Disposition:  Home   Recommendations for Outpatient Follow-up and new medication changes:  1. Follow up with Dr. Moreen Fowler in 7 days.  2. Please obtain basic metabolic panel in 7 days. 3. Furosemide increased to 40 mg twice daily.   Home Health: yes   Equipment/Devices: walker    Discharge Condition: stable  CODE STATUS: full  Diet recommendation: heart healthy and diabetic prudent.   Brief/Interim Summary: 68 year old female who presented with worsening lower extremity edema.  She does have significant past medical history for diastolic heart failure, chronic kidney disease stage III, type 2 diabetes mellitus, hypertension or tobacco abuse.  Reported lower extremity edema, associated with dyspnea and orthopnea.  On her initial physical examination her blood pressure was 175/79, heart rate 69, temperature 98.6, respiratory rate 17, oxygen saturation 100% on supplemental 02, her lungs were positive for bibasilar rales, heart S1-S2 present and rhythmic, abdomen soft and distended, positive lower extremity edema. Sodium 141, potassium 4.5, chloride 22, glucose 117, BUN 20, creatinine 2.12, BNP 1845.  Chest x-ray with hilar vascular congestion.  EKG 75 bpm, normal axis, normal intervals, sinus rhythm, no ST segment or T wave changes, poor R wave progression.  Patient was admitted to the hospital with a working diagnosis of decompensated heart failure.  1.  Acute on chronic diastolic heart failure.  Patient was admitted to the medical ward, she was placed on a remote telemetry monitor, received aggressive diuresis with IV furosemide, negative fluid balance was achieved (since admission 1,853 ml), with improvement of her symptoms.  Currently she is feeling back to her baseline.  Home health services will be  arranged, patient needs heart failure teaching, including salt restricted diet and fluid restriction.  Patient will continue heart failure management with carvedilol, hydralazine, isosorbide and furosemide.  Will increase furosemide to 40 mg twice daily.  2.  Acute kidney injury chronic kidney disease stage IV.  Patient had frequent monitoring of her kidney function, at discharge her creatinine has been stable at 3.3, continue diuresis with furosemide, follow-up kidney function as an outpatient within 7 days.  3.  Type 2 diabetes mellitus.  Patient was placed on insulin sliding scale for glucose coverage and monitoring. Continue with glipizide at discharge. Her fasting glucose in the hospital 177 mg/dl.   4.  Hypertension.  Blood pressure remained well controlled with carvedilol, hydralazine and isosorbide.  5.  Dyslipidemia.  Continue atorvastatin.  6.  Obesity.  Calculated BMI 39.05.    Discharge Diagnoses:  Principal Problem:   Acute on chronic diastolic CHF (congestive heart failure) (HCC) Active Problems:   Diabetes mellitus type 2, uncontrolled, with complications (HCC)   GERD (gastroesophageal reflux disease)   Diabetic neuropathy (HCC)   Current tobacco use   Essential (primary) hypertension   Anemia, chronic renal failure, stage 3 (moderate) (HCC)    Discharge Instructions  Discharge Instructions    Ambulatory referral to Nutrition and Diabetic Education   Complete by: As directed      Allergies as of 11/11/2018      Reactions   Aspirin Other (See Comments)   Feels funny    Allopurinol    Dizzy; hard to ambulate  Couldn't see; sweating   Amlodipine Other (See Comments)   headache   Lisinopril Swelling   Face swelling   Other Itching   Squash  Medication List    TAKE these medications   albuterol 108 (90 Base) MCG/ACT inhaler Commonly known as: VENTOLIN HFA Inhale 2 puffs into the lungs every 6 (six) hours as needed for wheezing or shortness of breath.    atorvastatin 40 MG tablet Commonly known as: LIPITOR Take 40 mg by mouth daily.   carvedilol 12.5 MG tablet Commonly known as: COREG Take 1 tablet (12.5 mg total) by mouth 2 (two) times daily with a meal.   cyclobenzaprine 5 MG tablet Commonly known as: FLEXERIL Take 5-10 mg by mouth at bedtime.   ergocalciferol 1.25 MG (50000 UT) capsule Commonly known as: VITAMIN D2 Take 50,000 Units by mouth once a week. Friday   fexofenadine 180 MG tablet Commonly known as: ALLEGRA Take 180 mg by mouth as needed for allergies.   furosemide 40 MG tablet Commonly known as: Lasix Take 1 tablet (40 mg total) by mouth 2 (two) times daily. What changed:   medication strength  when to take this   gabapentin 300 MG capsule Commonly known as: NEURONTIN Take 900 mg by mouth 3 (three) times daily.   glipiZIDE 10 MG 24 hr tablet Commonly known as: GLUCOTROL XL Take 10 mg by mouth daily with breakfast.   hydrALAZINE 25 MG tablet Commonly known as: APRESOLINE Take 3 tablets (75 mg total) by mouth 3 (three) times daily.   hydrocortisone cream 1 % Apply topically 2 (two) times daily. Areas of itch What changed:   how much to take  when to take this  reasons to take this   isosorbide mononitrate 60 MG 24 hr tablet Commonly known as: IMDUR Take 1 tablet (60 mg total) by mouth daily.   Linzess 145 MCG Caps capsule Generic drug: linaclotide Take 145 mcg by mouth as needed (constipation).   oxyCODONE-acetaminophen 5-325 MG tablet Commonly known as: PERCOCET/ROXICET Take 0.5 tablets by mouth every 6 (six) hours as needed for moderate pain or severe pain.   pantoprazole 40 MG tablet Commonly known as: PROTONIX Take 1 tablet (40 mg total) by mouth daily.   polyethylene glycol 17 g packet Commonly known as: MIRALAX / GLYCOLAX Take 17 g by mouth daily as needed for mild constipation.   triamcinolone cream 0.1 % Commonly known as: KENALOG Apply 1 application topically daily as  needed (itching). Back   triamcinolone ointment 0.1 % Commonly known as: KENALOG Apply 1 application topically 2 (two) times daily. Gel on inside of ear      Follow-up Information    [facility] Follow [facility].   Specialty: Home Health Services Why: Registered Nurse, Physical Therapy, Occupational Therapy- office to call you with a visit time. If you need to call them at [phone]         Allergies  Allergen Reactions  . Aspirin Other (See Comments)    Feels funny   . Allopurinol     Dizzy; hard to ambulate  Couldn't see; sweating  . Amlodipine Other (See Comments)    headache  . Lisinopril Swelling    Face swelling  . Other Itching    Squash    Consultations:     Procedures/Studies: Ct Abdomen Pelvis Wo Contrast  Result Date: 11/08/2018 CLINICAL DATA:  Abdominal distension EXAM: CT ABDOMEN AND PELVIS WITHOUT CONTRAST TECHNIQUE: Multidetector CT imaging of the abdomen and pelvis was performed following the standard protocol without IV contrast. Oral enteric contrast was administered. COMPARISON:  04/10/2018 FINDINGS: Lower chest: No acute abnormality. Bandlike scarring or atelectasis of the bilateral  lung bases. Hepatobiliary: No solid liver abnormality is seen. Mildly distended gallbladder containing dependent layering calcific sludge. No gallbladder wall thickening. No biliary ductal dilatation. Pancreas: Unremarkable. No pancreatic ductal dilatation or surrounding inflammatory changes. Spleen: Normal in size without significant abnormality. Adrenals/Urinary Tract: Adrenal glands are unremarkable. Kidneys are normal, without renal calculi, solid lesion, or hydronephrosis. Bladder is unremarkable. Stomach/Bowel: Stomach is within normal limits. Appendix appears normal. No evidence of bowel wall thickening, distention, or inflammatory changes. Occasional sigmoid diverticula. Vascular/Lymphatic: Aortic atherosclerosis. No enlarged abdominal or pelvic lymph  nodes. Reproductive: Calcified uterine fibroids. Other: Anasarca.  No abdominopelvic ascites. Musculoskeletal: No acute or significant osseous findings. IMPRESSION: 1. No non-contrast CT findings of the abdomen or pelvis to explain abdominal distension. No evidence of bowel obstruction. 2.  Anasarca. 3. Mildly distended gallbladder containing dependent layering calcific sludge. No gallbladder wall thickening. No biliary ductal dilatation. 4.  Other chronic and incidental findings as detailed above. Electronically Signed   By: Eddie Candle M.D.   On: 11/08/2018 18:43   Dg Chest 2 View  Result Date: 11/06/2018 CLINICAL DATA:  Leg edema EXAM: CHEST - 2 VIEW COMPARISON:  09/23/2018 FINDINGS: Heart is mildly enlarged. Mild peribronchial thickening. No overt edema. No confluent opacities or effusions. No acute bony abnormality. IMPRESSION: Mild cardiomegaly. Mild bronchitic changes. Electronically Signed   By: Rolm Baptise M.D.   On: 11/06/2018 11:56      Procedures:   Subjective: Patient is feeling back to baseline, no dyspnea and continue to improve lower extremity edema, no nausea or vomiting.   Discharge Exam: Vitals:   11/11/18 0415 11/11/18 0853  BP: (!) 139/54 130/74  Pulse: 84 88  Resp: 18   Temp: 98.2 F (36.8 C)   SpO2: 95% 95%   Vitals:   11/10/18 1119 11/10/18 1955 11/11/18 0415 11/11/18 0853  BP: 134/63 121/66 (!) 139/54 130/74  Pulse: 76 93 84 88  Resp: 18 18 18    Temp: 98.2 F (36.8 C) 98.4 F (36.9 C) 98.2 F (36.8 C)   TempSrc:  Oral Oral   SpO2: 94% 99% 95% 95%  Weight:   116.8 kg   Height:        General: Not in pain or dyspnea.  Neurology: Awake and alert, non focal  E ENT: no pallor, no icterus, oral mucosa moist Cardiovascular: No JVD. S1-S2 present, rhythmic, no gallops, rubs, or murmurs. Trace lower extremity edema. Pulmonary: vesicular breath sounds bilaterally, adequate air movement, no wheezing, rhonchi or rales. Gastrointestinal. Abdomen protuberant  with no organomegaly, non tender, no rebound or guarding Skin. No rashes Musculoskeletal: no joint deformities   The results of significant diagnostics from this hospitalization (including imaging, microbiology, ancillary and laboratory) are listed below for reference.     Microbiology: Recent Results (from the past 240 hour(s))  SARS Coronavirus 2 [facility] order, Performed in [facility] hospital lab) Nasopharyngeal Nasopharyngeal Swab     Status: None   Collection Time: 11/06/18  1:28 PM   Specimen: Nasopharyngeal Swab  Result Value Ref Range Status   SARS Coronavirus 2 NEGATIVE NEGATIVE Final    Comment: (NOTE) If result is NEGATIVE SARS-CoV-2 target nucleic acids are NOT DETECTED. The SARS-CoV-2 RNA is generally detectable in upper and lower  respiratory specimens during the acute phase of infection. The lowest  concentration of SARS-CoV-2 viral copies this assay can detect is 250  copies / mL. A negative result does not preclude SARS-CoV-2 infection  and should not be used as the sole basis for  treatment or other  patient management decisions.  A negative result may occur with  improper specimen collection / handling, submission of specimen other  than nasopharyngeal swab, presence of viral mutation(s) within the  areas targeted by this assay, and inadequate number of viral copies  (<250 copies / mL). A negative result must be combined with clinical  observations, patient history, and epidemiological information. If result is POSITIVE SARS-CoV-2 target nucleic acids are DETECTED. The SARS-CoV-2 RNA is generally detectable in upper and lower  respiratory specimens dur ing the acute phase of infection.  Positive  results are indicative of active infection with SARS-CoV-2.  Clinical  correlation with patient history and other diagnostic information is  necessary to determine patient infection status.  Positive results do  not rule out bacterial infection or co-infection with  other viruses. If result is PRESUMPTIVE POSTIVE SARS-CoV-2 nucleic acids MAY BE PRESENT.   A presumptive positive result was obtained on the submitted specimen  and confirmed on repeat testing.  While 2019 novel coronavirus  (SARS-CoV-2) nucleic acids may be present in the submitted sample  additional confirmatory testing may be necessary for epidemiological  and / or clinical management purposes  to differentiate between  SARS-CoV-2 and other Sarbecovirus currently known to infect humans.  If clinically indicated additional testing with an alternate test  methodology [phone]) is advised. The SARS-CoV-2 RNA is generally  detectable in upper and lower respiratory sp ecimens during the acute  phase of infection. The expected result is Negative. Fact Sheet for Patients:  StrictlyIdeas.no Fact Sheet for Healthcare Providers: BankingDealers.co.za This test is not yet approved or cleared by the [place] FDA and has been authorized for detection and/or diagnosis of SARS-CoV-2 by FDA under an Emergency Use Authorization (EUA).  This EUA will remain in effect (meaning this test can be used) for the duration of the COVID-19 declaration under Section 564(b)(1) of the Act, 21 U.S.C. section 360bbb-3(b)(1), unless the authorization is terminated or revoked sooner. Performed at [facility], [place] [address] [place], [place] [address]      Labs: BNP (last 3 results) Recent Labs    09/22/18 1014 09/23/18 0206 11/06/18 1024  BNP 1,721.0* 1,496.0* 5,784.6*   Basic Metabolic Panel: Recent Labs  Lab 11/07/18 0342 11/08/18 0324 11/09/18 0454 11/10/18 0711 11/11/18 0606  NA 141 139 137 136 135  K 4.5 4.7 4.7 5.2* 5.0  CL 107 106 102 99 102  CO2 27 24 27 28 28   GLUCOSE 139* 49* 125* 159* 177*  BUN 22 29* 32* 36* 43*  CREATININE 2.20* 2.57* 2.78* 3.29* 3.31*  CALCIUM 8.2* 8.1* 8.2* 8.2* 8.2*   Liver Function Tests: Recent Labs   Lab 11/06/18 1024  AST 13*  ALT 15  ALKPHOS 86  BILITOT 0.5  PROT 6.2*  ALBUMIN 2.9*   No results for input(s): LIPASE, AMYLASE in the last 168 hours. No results for input(s): AMMONIA in the last 168 hours. CBC: Recent Labs  Lab 11/06/18 1024 11/08/18 0324  WBC 6.7 6.3  NEUTROABS 4.2  --   HGB 8.3* 8.0*  HCT 28.0* 26.2*  MCV 88.9 86.8  PLT 191 177   Cardiac Enzymes: No results for input(s): CKTOTAL, CKMB, CKMBINDEX, TROPONINI in the last 168 hours. BNP: Invalid input(s): POCBNP CBG: Recent Labs  Lab 11/10/18 1555 11/10/18 2130 11/11/18 0620 11/11/18 0859 11/11/18 1102  GLUCAP 200* 207* 175* 196* 178*   D-Dimer No results for input(s): DDIMER in the last 72 hours. Hgb A1c No  results for input(s): HGBA1C in the last 72 hours. Lipid Profile No results for input(s): CHOL, HDL, LDLCALC, TRIG, CHOLHDL, LDLDIRECT in the last 72 hours. Thyroid function studies No results for input(s): TSH, T4TOTAL, T3FREE, THYROIDAB in the last 72 hours.  Invalid input(s): FREET3 Anemia work up No results for input(s): VITAMINB12, FOLATE, FERRITIN, TIBC, IRON, RETICCTPCT in the last 72 hours. Urinalysis    Component Value Date/Time   COLORURINE YELLOW 04/27/2018 1334   APPEARANCEUR CLEAR 04/27/2018 1334   LABSPEC 1.012 04/27/2018 1334   PHURINE 7.0 04/27/2018 1334   GLUCOSEU 50 (A) 04/27/2018 1334   HGBUR SMALL (A) 04/27/2018 1334   BILIRUBINUR NEGATIVE 04/27/2018 1334   BILIRUBINUR negative 02/25/2015 1426   KETONESUR NEGATIVE 04/27/2018 1334   PROTEINUR 100 (A) 04/27/2018 1334   UROBILINOGEN 0.2 02/25/2015 1426   UROBILINOGEN 1.0 04/16/2009 0943   NITRITE NEGATIVE 04/27/2018 1334   LEUKOCYTESUR NEGATIVE 04/27/2018 1334   Sepsis Labs Invalid input(s): PROCALCITONIN,  WBC,  LACTICIDVEN Microbiology Recent Results (from the past 240 hour(s))  SARS Coronavirus 2 [facility] order, Performed in [facility] hospital lab) Nasopharyngeal Nasopharyngeal Swab     Status: None    Collection Time: 11/06/18  1:28 PM   Specimen: Nasopharyngeal Swab  Result Value Ref Range Status   SARS Coronavirus 2 NEGATIVE NEGATIVE Final    Comment: (NOTE) If result is NEGATIVE SARS-CoV-2 target nucleic acids are NOT DETECTED. The SARS-CoV-2 RNA is generally detectable in upper and lower  respiratory specimens during the acute phase of infection. The lowest  concentration of SARS-CoV-2 viral copies this assay can detect is 250  copies / mL. A negative result does not preclude SARS-CoV-2 infection  and should not be used as the sole basis for treatment or other  patient management decisions.  A negative result may occur with  improper specimen collection / handling, submission of specimen other  than nasopharyngeal swab, presence of viral mutation(s) within the  areas targeted by this assay, and inadequate number of viral copies  (<250 copies / mL). A negative result must be combined with clinical  observations, patient history, and epidemiological information. If result is POSITIVE SARS-CoV-2 target nucleic acids are DETECTED. The SARS-CoV-2 RNA is generally detectable in upper and lower  respiratory specimens dur ing the acute phase of infection.  Positive  results are indicative of active infection with SARS-CoV-2.  Clinical  correlation with patient history and other diagnostic information is  necessary to determine patient infection status.  Positive results do  not rule out bacterial infection or co-infection with other viruses. If result is PRESUMPTIVE POSTIVE SARS-CoV-2 nucleic acids MAY BE PRESENT.   A presumptive positive result was obtained on the submitted specimen  and confirmed on repeat testing.  While 2019 novel coronavirus  (SARS-CoV-2) nucleic acids may be present in the submitted sample  additional confirmatory testing may be necessary for epidemiological  and / or clinical management purposes  to differentiate between  SARS-CoV-2 and other Sarbecovirus  currently known to infect humans.  If clinically indicated additional testing with an alternate test  methodology [phone]) is advised. The SARS-CoV-2 RNA is generally  detectable in upper and lower respiratory sp ecimens during the acute  phase of infection. The expected result is Negative. Fact Sheet for Patients:  StrictlyIdeas.no Fact Sheet for Healthcare Providers: BankingDealers.co.za This test is not yet approved or cleared by the [place] FDA and has been authorized for detection and/or diagnosis of SARS-CoV-2 by FDA under an Emergency Use Authorization (EUA).  This EUA will remain in effect (meaning this test can be used) for the duration of the COVID-19 declaration under Section 564(b)(1) of the Act, 21 U.S.C. section 360bbb-3(b)(1), unless the authorization is terminated or revoked sooner. Performed at [facility], [place] [address] [place], [place] [address]      Time coordinating discharge: 45 minutes  SIGNED:   Tawni Millers, MD  [facility] 11/11/2018, 11:31 AM

## 2018-11-14 DIAGNOSIS — I5032 Chronic diastolic (congestive) heart failure: Secondary | ICD-10-CM | POA: Diagnosis not present

## 2018-11-14 DIAGNOSIS — D631 Anemia in chronic kidney disease: Secondary | ICD-10-CM | POA: Diagnosis not present

## 2018-11-14 DIAGNOSIS — G4730 Sleep apnea, unspecified: Secondary | ICD-10-CM | POA: Diagnosis not present

## 2018-11-14 DIAGNOSIS — F17210 Nicotine dependence, cigarettes, uncomplicated: Secondary | ICD-10-CM | POA: Diagnosis not present

## 2018-11-14 DIAGNOSIS — I130 Hypertensive heart and chronic kidney disease with heart failure and stage 1 through stage 4 chronic kidney disease, or unspecified chronic kidney disease: Secondary | ICD-10-CM | POA: Diagnosis not present

## 2018-11-14 DIAGNOSIS — M1990 Unspecified osteoarthritis, unspecified site: Secondary | ICD-10-CM | POA: Diagnosis not present

## 2018-11-14 DIAGNOSIS — E669 Obesity, unspecified: Secondary | ICD-10-CM | POA: Diagnosis not present

## 2018-11-14 DIAGNOSIS — N184 Chronic kidney disease, stage 4 (severe): Secondary | ICD-10-CM | POA: Diagnosis not present

## 2018-11-14 DIAGNOSIS — F1210 Cannabis abuse, uncomplicated: Secondary | ICD-10-CM | POA: Diagnosis not present

## 2018-11-14 DIAGNOSIS — E785 Hyperlipidemia, unspecified: Secondary | ICD-10-CM | POA: Diagnosis not present

## 2018-11-14 DIAGNOSIS — J45909 Unspecified asthma, uncomplicated: Secondary | ICD-10-CM | POA: Diagnosis not present

## 2018-11-14 DIAGNOSIS — E1122 Type 2 diabetes mellitus with diabetic chronic kidney disease: Secondary | ICD-10-CM | POA: Diagnosis not present

## 2018-11-14 DIAGNOSIS — M1A30X0 Chronic gout due to renal impairment, unspecified site, without tophus (tophi): Secondary | ICD-10-CM | POA: Diagnosis not present

## 2018-11-14 DIAGNOSIS — K219 Gastro-esophageal reflux disease without esophagitis: Secondary | ICD-10-CM | POA: Diagnosis not present

## 2018-11-14 DIAGNOSIS — M810 Age-related osteoporosis without current pathological fracture: Secondary | ICD-10-CM | POA: Diagnosis not present

## 2018-11-14 DIAGNOSIS — E1140 Type 2 diabetes mellitus with diabetic neuropathy, unspecified: Secondary | ICD-10-CM | POA: Diagnosis not present

## 2018-11-14 DIAGNOSIS — E1165 Type 2 diabetes mellitus with hyperglycemia: Secondary | ICD-10-CM | POA: Diagnosis not present

## 2018-11-17 ENCOUNTER — Encounter: Payer: Self-pay | Admitting: Cardiovascular Disease

## 2018-11-17 ENCOUNTER — Telehealth (INDEPENDENT_AMBULATORY_CARE_PROVIDER_SITE_OTHER): Payer: PPO | Admitting: Cardiovascular Disease

## 2018-11-17 VITALS — BP 165/81 | HR 80 | Ht 68.0 in

## 2018-11-17 DIAGNOSIS — Z720 Tobacco use: Secondary | ICD-10-CM | POA: Diagnosis not present

## 2018-11-17 DIAGNOSIS — I10 Essential (primary) hypertension: Secondary | ICD-10-CM | POA: Diagnosis not present

## 2018-11-17 DIAGNOSIS — N184 Chronic kidney disease, stage 4 (severe): Secondary | ICD-10-CM

## 2018-11-17 DIAGNOSIS — Z5181 Encounter for therapeutic drug level monitoring: Secondary | ICD-10-CM

## 2018-11-17 DIAGNOSIS — N179 Acute kidney failure, unspecified: Secondary | ICD-10-CM | POA: Diagnosis not present

## 2018-11-17 DIAGNOSIS — I5043 Acute on chronic combined systolic (congestive) and diastolic (congestive) heart failure: Secondary | ICD-10-CM | POA: Diagnosis not present

## 2018-11-17 NOTE — Patient Instructions (Signed)
Medication Instructions:  Your physician recommends that you continue on your current medications as directed. Please refer to the Current Medication list given to you today.  If you need a refill on your cardiac medications before your next appointment, please call your pharmacy.   Lab work: BMET THIS WEEK   If you have labs (blood work) drawn today and your tests are completely normal, you will receive your results only by: Marland Kitchen MyChart Message (if you have MyChart) OR . A paper copy in the mail If you have any lab test that is abnormal or we need to change your treatment, we will call you to review the results.  Testing/Procedures: NONE   Follow-Up: 12/23/18 WITH Arnold Long DNP   Any Other Special Instructions Will Be Listed Below (If Applicable). MONITOR AND LOG YOUR BLOOD PRESSURE, BRING TO YOUR FOLLOW UP

## 2018-11-17 NOTE — Progress Notes (Signed)
Virtual Visit via Video Note   This visit type was conducted due to national recommendations for restrictions regarding the COVID-19 Pandemic (e.g. social distancing) in an effort to limit this patient's exposure and mitigate transmission in our community.  Due to her co-morbid illnesses, this patient is at least at moderate risk for complications without adequate follow up.  This format is felt to be most appropriate for this patient at this time.  All issues noted in this document were discussed and addressed.  A limited physical exam was performed with this format.  Please refer to the patient's chart for her consent to telehealth for [facility].   Evaluation Performed:  Follow-up visit  Date:  11/17/2018   ID:  Zoe Clarke, Zoe Clarke 1951/01/26, MRN [id number]  Patient Location: Home Provider Location: Office  PCP:  Antony Contras, MD  Cardiologist:  Skeet Latch, MD  Electrophysiologist:  None   Chief Complaint: heart failure  History of Present Illness:    Zoe Clarke is a 68 y.o. female with chronic diastolic heart failure, diabetes, hypertension CKD IV, and asthma here for follow up.  She was admitted 03/2018 with nausea and abdominal pain at which time she was found to have calculus cholecystitis and outpatient follow-up with surgery was recommended.  Surgery recommended cardiology clearance prior to cholecystectomy.  She had a CT of the abdomen 04/10/2018 that revealed changes suggestive of right-sided focal pyelonephritis with cholelithiasis.  She was then admitted 04/2018 with chest pain.  In the ED she was noted to have pulmonary edema on chest x-ray and there was no improvement with nebulizer treatments.  Oxygen saturation was 82% on room air.  BNP was 630.  Cardiac enzymes were negative.  She had an echo that admission that revealed LVEF 60 to 65% and grade 1 diastolic dysfunction.  Her echo was otherwise unremarkable.  RA pressure was 8 mmHg.  Symptoms improved  with diuresis despite acute on chronic renal failure (creatinine increased from 1.8 to 2.2).  She was started on Imdur and hydralazine. CXR was concerning for RLL infiltrate and she was treated for CAP.  During hospitalization she was noted to have metabolic encephalopathy in the setting of polypharmacy.  Gabapentin was reduced and muscle relaxer, Xanax and Percocet were discontinued.  However she continues to take these medications. She was diagnosed with diabetes in 2003.  She has had hypertension all of her adult life.    She had a colonoscopy 10/2015.  The following day she had an episode of syncope in the setting of taking her BP medication during bowel prep.  She was admitted 09/2018 for acute on chronic diastolic heart failure.  She diuresed 3 L during that hospitalization.  Her home carvedilol and hydralazine were both increased.  That hospitalization was complicated by hypertensive urgency and acute on chronic renal failure.  She followed up with Coletta Memos, NP on 10/23/2018 for preoperative clearance prior to gallbladder surgery.  At that time there was some misunderstandings about how she should be taking her home medications.  She had not been taking hydralazine or Lasix properly.  Her blood pressure at that time was 228/94.  Since that time she was admitted to th hospital again with acute on chronic heart failure. She was diuresed 1.8L and Lasix was increased to 40 mg twice daily.  At discharge her creatinine was up to 3.3.  Since discharge she has been feeling well.  She denies any lower extremity edema.  She reports that yesterday her  blood pressure was 161 systolic.  Today it is 165/81.  She has not yet taken her morning medications.  She has not had any chest pain or shortness of breath.  She continues to smoke and is not ready to quit.   The patient does not have symptoms concerning for COVID-19 infection (fever, chills, cough, or new shortness of breath).    Past Medical History:    Diagnosis Date   Allergy    Arthritis    Asthma    weazing at night uses inhalers   Bronchitis    history of   Chronic diastolic CHF (congestive heart failure) ([place]) 04/2018   CKD (chronic kidney disease), stage III (HCC)    Diabetes mellitus without complication (HCC)    Gastric ulcer    GERD (gastroesophageal reflux disease)    History of hiatal hernia    Hypertension    Osteoporosis    Sleep apnea 09/22/2018   Past Surgical History:  Procedure Laterality Date   DILATION AND CURETTAGE OF UTERUS     EYE SURGERY Left    removed cataract w/ lens   TUBAL LIGATION       Current Meds  Medication Sig   albuterol (PROVENTIL HFA;VENTOLIN HFA) 108 (90 Base) MCG/ACT inhaler Inhale 2 puffs into the lungs every 6 (six) hours as needed for wheezing or shortness of breath.   atorvastatin (LIPITOR) 40 MG tablet Take 40 mg by mouth daily.   carvedilol (COREG) 12.5 MG tablet Take 1 tablet (12.5 mg total) by mouth 2 (two) times daily with a meal.   cyclobenzaprine (FLEXERIL) 5 MG tablet Take 5-10 mg by mouth at bedtime.    ergocalciferol (VITAMIN D2) 1.25 MG (50000 UT) capsule Take 50,000 Units by mouth once a week. Friday   fexofenadine (ALLEGRA) 180 MG tablet Take 180 mg by mouth as needed for allergies.    furosemide (LASIX) 40 MG tablet Take 1 tablet (40 mg total) by mouth 2 (two) times daily.   gabapentin (NEURONTIN) 300 MG capsule Take 900 mg by mouth 3 (three) times daily.    glipiZIDE (GLUCOTROL XL) 10 MG 24 hr tablet Take 10 mg by mouth daily with breakfast.    hydrALAZINE (APRESOLINE) 25 MG tablet Take 3 tablets (75 mg total) by mouth 3 (three) times daily.   hydrocortisone cream 1 % Apply topically 2 (two) times daily. Areas of itch (Patient taking differently: Apply 1 application topically as needed for itching. Areas of itch)   isosorbide mononitrate (IMDUR) 60 MG 24 hr tablet Take 1 tablet (60 mg total) by mouth daily.   linaclotide (LINZESS) 145 MCG  CAPS capsule Take 145 mcg by mouth as needed (constipation).   oxyCODONE-acetaminophen (PERCOCET/ROXICET) 5-325 MG tablet Take 0.5 tablets by mouth every 6 (six) hours as needed for moderate pain or severe pain.    pantoprazole (PROTONIX) 40 MG tablet Take 1 tablet (40 mg total) by mouth daily.   polyethylene glycol (MIRALAX / GLYCOLAX) packet Take 17 g by mouth daily as needed for mild constipation.   triamcinolone cream (KENALOG) 0.1 % Apply 1 application topically daily as needed (itching). Back   triamcinolone ointment (KENALOG) 0.1 % Apply 1 application topically 2 (two) times daily. Gel on inside of ear     Allergies:   Aspirin, Allopurinol, Amlodipine, Lisinopril, and Other   Social History   Tobacco Use   Smoking status: Current Every Day Smoker    Packs/day: 0.50    Years: 39.00    Pack years: 19.50  Types: Cigarettes   Smokeless tobacco: Never Used   Tobacco comment: 1 pack last 3 days  Substance Use Topics   Alcohol use: No    Alcohol/week: 0.0 standard drinks   Drug use: Not Currently    Types: Marijuana     Family Hx: The patient's family history includes Arthritis in her maternal grandmother and mother; Cancer in her sister; Emphysema in her mother; Heart failure in her mother, sister, and sister; Hypertension in her maternal grandmother and mother; Lung disease in her sister; Stroke in her maternal grandfather. There is no history of Colon cancer, Esophageal cancer, Stomach cancer, or Rectal cancer.  ROS:   Please see the history of present illness.     All other systems reviewed and are negative.  Prior CV studies:   The following studies were reviewed today:  Echo 04/28/18: IMPRESSIONS   1. The left ventricle has normal systolic function of 60-63%. The cavity size was normal. There is mildly increased left ventricular wall thickness. Echo evidence of impaired diastolic relaxation Elevated mean left atrial pressure.  2. The right ventricle has  normal systolic function. The cavity was normal. There is no increase in right ventricular wall thickness.  3. Left atrial size was mildly dilated.  4. The mitral valve is normal in structure. There is mild thickening.  5. The tricuspid valve is normal in structure.  6. The aortic valve is normal in structure. Aortic valve regurgitation is mild by color flow Doppler.  7. The inferior vena cava was dilated in size with >50% respiratory variability.  Labs/Other Tests and Data Reviewed:    EKG:  An ECG dated 04/29/18 was personally reviewed today and demonstrated:  sinus tachycardia.  Rate 104 bpm.  Recent Labs: 04/27/2018: TSH 3.871 09/29/2018: Magnesium 2.6 11/06/2018: ALT 15; B Natriuretic Peptide 1,845.4 11/08/2018: Hemoglobin 8.0; Platelets 177 11/11/2018: BUN 43; Creatinine, Ser 3.31; Potassium 5.0; Sodium 135   Recent Lipid Panel Lab Results  Component Value Date/Time   CHOL 124 09/25/2018 02:57 AM   TRIG 115 09/25/2018 02:57 AM   HDL 46 09/25/2018 02:57 AM   CHOLHDL 2.7 09/25/2018 02:57 AM   LDLCALC 55 09/25/2018 02:57 AM    Wt Readings from Last 3 Encounters:  11/11/18 257 lb 8 oz (116.8 kg)  10/23/18 250 lb (113.4 kg)  09/30/18 248 lb 0.3 oz (112.5 kg)     Objective:    VS:  BP (!) 165/81    Pulse 80    Ht 5\' 8"  (1.727 m)    BMI 39.15 kg/m  , BMI Body mass index is 39.15 kg/m. GENERAL:  Sounds well.   Pulm: Respirations non-labored Psych: Normal affect and mood Neuro: Speech fluent   ASSESSMENT & PLAN:    # Chronic diastolic heart failure: # Essential hypertension: Ms. Geerdes has diastolic heart failure.  She has been hospitalized multiple times.  This is at least partially due to poorly controlled blood pressure.  We talked about the importance of controlling her blood pressure.  I suggested changing her hydralazine to 100 mg 3 times daily.  However she declined.  I also discussed that Ty between her blood pressure and her kidney disease.  We discussed limiting salt  and fluid intake.  She reports understanding everything that we discussed but remains uninterested in increasing her antihypertensive regimen.  We did discuss getting a scale and weigh herself each morning.  She plans to buy one later this week.  Continue carvedilol, lasix, hydralazine and Imdur.  She will  come for a repeat BMP this week.  # Hyperlipidemia: Continue atorvastatin.  LDL 55 on 09/2018.  # Tobacco abuse: Ms Slovacek was encouraged to quit smoking.  She remains uninterested in quitting at this time.   # Pre-surgical risk assessment: Lexiscan Myoview 07/2018 was negative for ischemia.  She has no ischemic symptoms.  Her main surgical risks are related to poorly controlled hypertension and acute on chronic renal failure.  BMP pending.  She will track her BP and if renal function and BP are stable at follow up she will be at acceptable risk for surgery.  # Likely OSA:  Patient has snoring, apnea and tired upon awakening.  She will need a sleep study.    COVID-19 Education: The signs and symptoms of COVID-19 were discussed with the patient and how to seek care for testing (follow up with PCP or arrange E-visit).  The importance of social distancing was discussed today.  Time:   Today, I have spent 25 minutes with the patient with telehealth technology discussing the above problems.     Medication Adjustments/Labs and Tests Ordered: Current medicines are reviewed at length with the patient today.  Concerns regarding medicines are outlined above.   Tests Ordered: Orders Placed This Encounter  Procedures   Basic metabolic panel    Medication Changes: No orders of the defined types were placed in this encounter.   Disposition:  Follow up in 1 month(s)  Signed, Skeet Latch, MD  11/17/2018 10:14 AM    [place]

## 2018-11-18 DIAGNOSIS — D631 Anemia in chronic kidney disease: Secondary | ICD-10-CM | POA: Diagnosis not present

## 2018-11-18 DIAGNOSIS — M1A30X0 Chronic gout due to renal impairment, unspecified site, without tophus (tophi): Secondary | ICD-10-CM | POA: Diagnosis not present

## 2018-11-18 DIAGNOSIS — G4730 Sleep apnea, unspecified: Secondary | ICD-10-CM | POA: Diagnosis not present

## 2018-11-18 DIAGNOSIS — I5032 Chronic diastolic (congestive) heart failure: Secondary | ICD-10-CM | POA: Diagnosis not present

## 2018-11-18 DIAGNOSIS — E1140 Type 2 diabetes mellitus with diabetic neuropathy, unspecified: Secondary | ICD-10-CM | POA: Diagnosis not present

## 2018-11-18 DIAGNOSIS — N184 Chronic kidney disease, stage 4 (severe): Secondary | ICD-10-CM | POA: Diagnosis not present

## 2018-11-18 DIAGNOSIS — E1122 Type 2 diabetes mellitus with diabetic chronic kidney disease: Secondary | ICD-10-CM | POA: Diagnosis not present

## 2018-11-18 DIAGNOSIS — I130 Hypertensive heart and chronic kidney disease with heart failure and stage 1 through stage 4 chronic kidney disease, or unspecified chronic kidney disease: Secondary | ICD-10-CM | POA: Diagnosis not present

## 2018-11-18 DIAGNOSIS — K219 Gastro-esophageal reflux disease without esophagitis: Secondary | ICD-10-CM | POA: Diagnosis not present

## 2018-11-18 DIAGNOSIS — E1165 Type 2 diabetes mellitus with hyperglycemia: Secondary | ICD-10-CM | POA: Diagnosis not present

## 2018-11-18 DIAGNOSIS — E785 Hyperlipidemia, unspecified: Secondary | ICD-10-CM | POA: Diagnosis not present

## 2018-11-18 DIAGNOSIS — J45909 Unspecified asthma, uncomplicated: Secondary | ICD-10-CM | POA: Diagnosis not present

## 2018-11-18 DIAGNOSIS — M1990 Unspecified osteoarthritis, unspecified site: Secondary | ICD-10-CM | POA: Diagnosis not present

## 2018-11-21 DIAGNOSIS — I10 Essential (primary) hypertension: Secondary | ICD-10-CM | POA: Diagnosis not present

## 2018-11-21 DIAGNOSIS — Z5181 Encounter for therapeutic drug level monitoring: Secondary | ICD-10-CM | POA: Diagnosis not present

## 2018-11-25 ENCOUNTER — Telehealth: Payer: Self-pay | Admitting: *Deleted

## 2018-11-25 DIAGNOSIS — M1990 Unspecified osteoarthritis, unspecified site: Secondary | ICD-10-CM | POA: Diagnosis not present

## 2018-11-25 DIAGNOSIS — G4730 Sleep apnea, unspecified: Secondary | ICD-10-CM | POA: Diagnosis not present

## 2018-11-25 DIAGNOSIS — K219 Gastro-esophageal reflux disease without esophagitis: Secondary | ICD-10-CM | POA: Diagnosis not present

## 2018-11-25 DIAGNOSIS — E875 Hyperkalemia: Secondary | ICD-10-CM

## 2018-11-25 DIAGNOSIS — I5032 Chronic diastolic (congestive) heart failure: Secondary | ICD-10-CM | POA: Diagnosis not present

## 2018-11-25 DIAGNOSIS — E1165 Type 2 diabetes mellitus with hyperglycemia: Secondary | ICD-10-CM | POA: Diagnosis not present

## 2018-11-25 DIAGNOSIS — N184 Chronic kidney disease, stage 4 (severe): Secondary | ICD-10-CM | POA: Diagnosis not present

## 2018-11-25 DIAGNOSIS — D631 Anemia in chronic kidney disease: Secondary | ICD-10-CM | POA: Diagnosis not present

## 2018-11-25 DIAGNOSIS — E785 Hyperlipidemia, unspecified: Secondary | ICD-10-CM | POA: Diagnosis not present

## 2018-11-25 DIAGNOSIS — I130 Hypertensive heart and chronic kidney disease with heart failure and stage 1 through stage 4 chronic kidney disease, or unspecified chronic kidney disease: Secondary | ICD-10-CM | POA: Diagnosis not present

## 2018-11-25 DIAGNOSIS — E1122 Type 2 diabetes mellitus with diabetic chronic kidney disease: Secondary | ICD-10-CM | POA: Diagnosis not present

## 2018-11-25 DIAGNOSIS — M1A30X0 Chronic gout due to renal impairment, unspecified site, without tophus (tophi): Secondary | ICD-10-CM | POA: Diagnosis not present

## 2018-11-25 DIAGNOSIS — E1140 Type 2 diabetes mellitus with diabetic neuropathy, unspecified: Secondary | ICD-10-CM | POA: Diagnosis not present

## 2018-11-25 DIAGNOSIS — J45909 Unspecified asthma, uncomplicated: Secondary | ICD-10-CM | POA: Diagnosis not present

## 2018-11-25 LAB — BASIC METABOLIC PANEL
BUN/Creatinine Ratio: 11 — ABNORMAL LOW (ref 12–28)
BUN: 29 mg/dL — ABNORMAL HIGH (ref 8–27)
CO2: 23 mmol/L (ref 20–29)
Calcium: 9 mg/dL (ref 8.7–10.3)
Chloride: 105 mmol/L (ref 96–106)
Creatinine, Ser: 2.53 mg/dL — ABNORMAL HIGH (ref 0.57–1.00)
GFR calc Af Amer: 22 mL/min/{1.73_m2} — ABNORMAL LOW (ref >59)
GFR calc non Af Amer: 19 mL/min/{1.73_m2} — ABNORMAL LOW (ref >59)
Glucose: 81 mg/dL (ref 65–99)
Potassium: 6 mmol/L (ref 3.5–5.2)
Sodium: 142 mmol/L (ref 134–144)

## 2018-11-25 MED ORDER — SODIUM POLYSTYRENE SULFONATE 15 GM/60ML PO SUSP: ORAL | 0 refills | Status: DC

## 2018-11-25 NOTE — Telephone Encounter (Signed)
-----   Message from Skeet Latch, MD sent at 11/24/2018  4:10 PM EDT ----- We have been trying to reach her regarding hyperkalemia.  Have advised her to call back to the office.  Make sure she isn't taking any potassium supplements.  Please order kayexalate 15g daily x2 days.  Repeat BMP this week.

## 2018-11-25 NOTE — Telephone Encounter (Signed)
Left message to call back  

## 2018-11-26 ENCOUNTER — Encounter: Payer: Self-pay | Admitting: *Deleted

## 2018-11-26 ENCOUNTER — Telehealth: Payer: Self-pay | Admitting: Cardiovascular Disease

## 2018-11-26 MED ORDER — SODIUM POLYSTYRENE SULFONATE 15 GM/60ML PO SUSP: 15.0000 g | Freq: Every day | ORAL | 0 refills | Status: AC

## 2018-11-26 NOTE — Telephone Encounter (Signed)
Spoke with patient and advised of labs. Kayexalate delivered to patient today. Patient will take 15 g today and tomorrow recheck labs Friday. Pharmacy delivered patient 473 ml instead of 120 ml. Advised patient to take 60 ml today and tomorrow and repeat labs Friday. She can not come Friday but will try to come to office Monday for repeat labs.

## 2018-11-26 NOTE — Telephone Encounter (Signed)
Spoke with Molli Hazard at [facility].  We reviewed the patient's Kayexalate order and patient should be prescribed 15g daily for 2 days as stated by Dr Oval Linsey in the result note on 11/24/2018 at 4:10 pm. Nicki Reaper realized their directions were not clear for the amount to take for the patient and I realized we had sent in for 15 mg instead of 15 g. He advised me to resend prescription and he will call patient to clarify this for her.  Prescription resent.

## 2018-11-26 NOTE — Telephone Encounter (Signed)
  Patient picked up the sodium polystyrene (KAYEXALATE) 15 GM/60ML suspension today and it says to take 15 mg daily for 2 days. Patient does not understand how to figure out how much that will be so she can take it. Please advise.

## 2018-11-27 ENCOUNTER — Emergency Department (HOSPITAL_COMMUNITY): Payer: PPO

## 2018-11-27 ENCOUNTER — Emergency Department (HOSPITAL_COMMUNITY)
Admission: EM | Admit: 2018-11-27 | Discharge: 2018-11-28 | Disposition: A | Discharge status: Home / Self Care | Payer: PPO | Attending: Emergency Medicine | Admitting: Emergency Medicine

## 2018-11-27 ENCOUNTER — Ambulatory Visit: Payer: PPO | Admitting: Cardiovascular Disease

## 2018-11-27 ENCOUNTER — Other Ambulatory Visit: Payer: Self-pay

## 2018-11-27 DIAGNOSIS — K8020 Calculus of gallbladder without cholecystitis without obstruction: Secondary | ICD-10-CM | POA: Diagnosis not present

## 2018-11-27 DIAGNOSIS — E1122 Type 2 diabetes mellitus with diabetic chronic kidney disease: Secondary | ICD-10-CM | POA: Insufficient documentation

## 2018-11-27 DIAGNOSIS — J811 Chronic pulmonary edema: Secondary | ICD-10-CM | POA: Diagnosis not present

## 2018-11-27 DIAGNOSIS — F17210 Nicotine dependence, cigarettes, uncomplicated: Secondary | ICD-10-CM | POA: Diagnosis not present

## 2018-11-27 DIAGNOSIS — R609 Edema, unspecified: Secondary | ICD-10-CM | POA: Diagnosis not present

## 2018-11-27 DIAGNOSIS — I5032 Chronic diastolic (congestive) heart failure: Secondary | ICD-10-CM | POA: Insufficient documentation

## 2018-11-27 DIAGNOSIS — I130 Hypertensive heart and chronic kidney disease with heart failure and stage 1 through stage 4 chronic kidney disease, or unspecified chronic kidney disease: Secondary | ICD-10-CM | POA: Insufficient documentation

## 2018-11-27 DIAGNOSIS — R52 Pain, unspecified: Secondary | ICD-10-CM | POA: Diagnosis not present

## 2018-11-27 DIAGNOSIS — Z7984 Long term (current) use of oral hypoglycemic drugs: Secondary | ICD-10-CM | POA: Diagnosis not present

## 2018-11-27 DIAGNOSIS — N183 Chronic kidney disease, stage 3 (moderate): Secondary | ICD-10-CM | POA: Insufficient documentation

## 2018-11-27 DIAGNOSIS — I10 Essential (primary) hypertension: Secondary | ICD-10-CM | POA: Diagnosis not present

## 2018-11-27 DIAGNOSIS — R1032 Left lower quadrant pain: Secondary | ICD-10-CM | POA: Insufficient documentation

## 2018-11-27 DIAGNOSIS — Z79899 Other long term (current) drug therapy: Secondary | ICD-10-CM | POA: Insufficient documentation

## 2018-11-27 DIAGNOSIS — R1084 Generalized abdominal pain: Secondary | ICD-10-CM | POA: Diagnosis not present

## 2018-11-27 DIAGNOSIS — R10817 Generalized abdominal tenderness: Secondary | ICD-10-CM | POA: Insufficient documentation

## 2018-11-27 LAB — CBC WITH DIFFERENTIAL/PLATELET
Abs Immature Granulocytes: 0.02 10*3/uL (ref 0.00–0.07)
Basophils Absolute: 0 10*3/uL (ref 0.0–0.1)
Basophils Relative: 0 %
Eosinophils Absolute: 0.2 10*3/uL (ref 0.0–0.5)
Eosinophils Relative: 3 %
HCT: 27.3 % — ABNORMAL LOW (ref 36.0–46.0)
Hemoglobin: 8.2 g/dL — ABNORMAL LOW (ref 12.0–15.0)
Immature Granulocytes: 0 %
Lymphocytes Relative: 25 %
Lymphs Abs: 1.9 10*3/uL (ref 0.7–4.0)
MCH: 26.6 pg (ref 26.0–34.0)
MCHC: 30 g/dL (ref 30.0–36.0)
MCV: 88.6 fL (ref 80.0–100.0)
Monocytes Absolute: 0.6 10*3/uL (ref 0.1–1.0)
Monocytes Relative: 8 %
Neutro Abs: 4.8 10*3/uL (ref 1.7–7.7)
Neutrophils Relative %: 64 %
Platelets: 205 10*3/uL (ref 150–400)
RBC: 3.08 MIL/uL — ABNORMAL LOW (ref 3.87–5.11)
RDW: 15.3 % (ref 11.5–15.5)
WBC: 7.5 10*3/uL (ref 4.0–10.5)
nRBC: 0 % (ref 0.0–0.2)

## 2018-11-27 LAB — BASIC METABOLIC PANEL
Anion gap: 6 (ref 5–15)
BUN: 19 mg/dL (ref 8–23)
CO2: 26 mmol/L (ref 22–32)
Calcium: 8.5 mg/dL — ABNORMAL LOW (ref 8.9–10.3)
Chloride: 111 mmol/L (ref 98–111)
Creatinine, Ser: 2.28 mg/dL — ABNORMAL HIGH (ref 0.44–1.00)
GFR calc Af Amer: 25 mL/min — ABNORMAL LOW (ref >60)
GFR calc non Af Amer: 22 mL/min — ABNORMAL LOW (ref >60)
Glucose, Bld: 99 mg/dL (ref 70–99)
Potassium: 4.3 mmol/L (ref 3.5–5.1)
Sodium: 143 mmol/L (ref 135–145)

## 2018-11-27 LAB — URINALYSIS, ROUTINE W REFLEX MICROSCOPIC
Bacteria, UA: NONE SEEN
Bilirubin Urine: NEGATIVE
Glucose, UA: NEGATIVE mg/dL
Hgb urine dipstick: NEGATIVE
Ketones, ur: NEGATIVE mg/dL
Leukocytes,Ua: NEGATIVE
Nitrite: NEGATIVE
Protein, ur: >=300 mg/dL — AB
Specific Gravity, Urine: 1.013 (ref 1.005–1.030)
pH: 7 (ref 5.0–8.0)

## 2018-11-27 LAB — LACTIC ACID, PLASMA: Lactic Acid, Venous: 0.6 mmol/L (ref 0.5–1.9)

## 2018-11-27 LAB — BRAIN NATRIURETIC PEPTIDE: B Natriuretic Peptide: 1366.9 pg/mL — ABNORMAL HIGH (ref 0.0–100.0)

## 2018-11-27 MED ORDER — FENTANYL CITRATE (PF) 100 MCG/2ML IJ SOLN
50.0000 ug | Freq: Once | INTRAMUSCULAR | Status: AC
  Administered 2018-11-27: 50 ug via INTRAVENOUS
  Filled 2018-11-27: qty 2

## 2018-11-27 MED ORDER — ONDANSETRON HCL 4 MG/2ML IJ SOLN
4.0000 mg | Freq: Once | INTRAMUSCULAR | Status: AC
  Administered 2018-11-27: 21:00:00 4 mg via INTRAVENOUS
  Filled 2018-11-27: qty 2

## 2018-11-27 MED ORDER — ACETAMINOPHEN 325 MG PO TABS: 650.0000 mg | ORAL_TABLET | Freq: Four times a day (QID) | ORAL | 0 refills | Status: DC | PRN

## 2018-11-27 NOTE — ED Triage Notes (Signed)
Pt brought in by ems for c/o LLQ pain on and off x couple of weeks along with abd swelling ; pt denies any n/v/d ; pt also states her pcp told her she had a high k+  A couple of days ago and she states she was given meds for her high k+

## 2018-11-27 NOTE — Discharge Instructions (Signed)
All the results in the ER are normal, labs and imaging. We are not sure what is causing your symptoms. The workup in the ER is not complete, and is limited to screening for life threatening and emergent conditions only, so please see a primary care doctor for further evaluation.  Strict ER return precautions have been discussed, and patient is agreeing with the plan and is comfortable with the workup done and the recommendations from the ER.

## 2018-11-27 NOTE — ED Provider Notes (Signed)
[facility] EMERGENCY DEPARTMENT Provider Note   CSN: [id number] Arrival date & time: 11/27/18  1758     History   Chief Complaint Chief Complaint  Patient presents with   Abdominal Pain    HPI Zoe Clarke is a 68 y.o. female.     HPI  Patient comes into the ER with chief complaint of abdominal pain. Patient has history of CHF, CKD, diabetes, GERD, gastric ulcers, cholelithiasis.  She reports that she started having left lower quadrant pain about 2 or 3 days ago.  The pain is constant and described as dull pain.  The pain is nonradiating.  She has no history of similar pain in the past.  Review of systems negative for any burning with urination, frequent urination that is new, blood in the urine.  She also denies any constipation, diarrhea, bloody stools.  Patient has been seen in the ED with abdominal pain recently.  She reports that this pain is different, in terms of location and quality of pain.  Pain at its worst is 9 out of 10 and during the assessment is rated at 8 out of 10.  No specific aggravating or relieving factors found.  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 ([place]) 09/22/2018   Thyroid nodule 09/22/2018   Anemia, chronic renal failure, stage 3  (moderate) ([place]) 09/22/2018   Sleep apnea 09/22/2018   Precordial chest pain    Symptomatic cholelithiasis 07/30/2018   Acute on chronic combined systolic (congestive) and diastolic (congestive) heart failure ([place]) 07/29/2018   Dyspnea 07/29/2018   Tobacco abuse counseling 04/27/2018   Acute pyelonephritis 04/10/2018   Hypertensive urgency 04/10/2018   Leukocytosis 09/10/2017   Anemia 09/10/2017   Knee pain, bilateral 01/13/2015   Bilateral hip pain 11/23/2014   Diabetes mellitus type 2, uncontrolled, with complications ([place]) 40/98/1191   Intertrigo 10/25/2014   GERD (gastroesophageal reflux disease) 10/25/2014   Diabetic neuropathy ([place]) 10/25/2014   Chronic low back pain 10/25/2014   Current tobacco use 11/11/2013   Stage 3 chronic kidney disease due to type 2 diabetes mellitus ([place]) 09/02/2013   Essential (primary) hypertension 09/02/2013   Gout 12/05/2012    Past Surgical History:  Procedure Laterality Date   DILATION AND CURETTAGE OF UTERUS     EYE SURGERY Left    removed cataract w/ lens   TUBAL LIGATION       OB History   No obstetric history on file.      Home Medications    Prior to Admission medications   Medication Sig Start Date End Date Taking? Authorizing Provider  acetaminophen (TYLENOL) 325 MG tablet Take 2 tablets (650 mg total) by mouth every 6 (six) hours as needed. 11/27/18  Varney Biles, MD  albuterol (PROVENTIL HFA;VENTOLIN HFA) 108 (90 Base) MCG/ACT inhaler Inhale 2 puffs into the lungs every 6 (six) hours as needed for wheezing or shortness of breath. 04/14/18   Bonnell Public, MD  atorvastatin (LIPITOR) 40 MG tablet Take 40 mg by mouth daily.    [provider]  carvedilol (COREG) 12.5 MG tablet Take 1 tablet (12.5 mg total) by mouth 2 (two) times daily with a meal. 09/30/18   Pokhrel, [place], MD  cyclobenzaprine (FLEXERIL) 5 MG tablet Take 5-10 mg by mouth at bedtime.     [provider]    ergocalciferol (VITAMIN D2) 1.25 MG (50000 UT) capsule Take 50,000 Units by mouth once a week. Friday    [provider]  fexofenadine (ALLEGRA) 180 MG tablet Take 180 mg by mouth as needed for allergies.     [provider]  furosemide (LASIX) 40 MG tablet Take 1 tablet (40 mg total) by mouth 2 (two) times daily. 11/11/18 12/11/18  Arrien, Jimmy Picket, MD  gabapentin (NEURONTIN) 300 MG capsule Take 900 mg by mouth 3 (three) times daily.     [provider]  glipiZIDE (GLUCOTROL XL) 10 MG 24 hr tablet Take 10 mg by mouth daily with breakfast.     [provider]  hydrALAZINE (APRESOLINE) 25 MG tablet Take 3 tablets (75 mg total) by mouth 3 (three) times daily. 09/30/18 11/17/18  Pokhrel, Corrie Mckusick, MD  hydrocortisone cream 1 % Apply topically 2 (two) times daily. Areas of itch Patient taking differently: Apply 1 application topically as needed for itching. Areas of itch 09/30/18   Pokhrel, Corrie Mckusick, MD  isosorbide mononitrate (IMDUR) 60 MG 24 hr tablet Take 1 tablet (60 mg total) by mouth daily. 10/01/18 11/17/18  Pokhrel, Corrie Mckusick, MD  linaclotide (LINZESS) 145 MCG CAPS capsule Take 145 mcg by mouth as needed (constipation).    [provider]  oxyCODONE-acetaminophen (PERCOCET/ROXICET) 5-325 MG tablet Take 0.5 tablets by mouth every 6 (six) hours as needed for moderate pain or severe pain.     [provider]  pantoprazole (PROTONIX) 40 MG tablet Take 1 tablet (40 mg total) by mouth daily. 07/14/18   Skeet Latch, MD  polyethylene glycol [facility] / [name] packet Take 17 g by mouth daily as needed for mild constipation. 04/14/18   Dana Allan I, MD  sodium polystyrene (KAYEXALATE) 15 GM/60ML suspension Take 60 mLs (15 g total) by mouth daily for 2 days. 11/26/18 11/28/18  Skeet Latch, MD  triamcinolone cream (KENALOG) 0.1 % Apply 1 application topically daily as needed (itching). Back    [provider]  triamcinolone ointment  (KENALOG) 0.1 % Apply 1 application topically 2 (two) times daily. Gel on inside of ear    [provider]    Family History Family History  Problem Relation Age of Onset   Arthritis Mother    Hypertension Mother    Heart failure Mother    Emphysema Mother    Arthritis Maternal Grandmother    Hypertension Maternal Grandmother    Stroke Maternal Grandfather    Cancer Sister        unknown type cancer   Heart failure Sister    Lung disease Sister    Heart failure Sister    Colon cancer Neg Hx    Esophageal cancer Neg Hx    Stomach cancer Neg Hx    Rectal cancer Neg Hx     Social History Social History   Tobacco Use   Smoking status:  Current Every Day Smoker    Packs/day: 0.50    Years: 39.00    Pack years: 19.50    Types: Cigarettes   Smokeless tobacco: Never Used   Tobacco comment: 1 pack last 3 days  Substance Use Topics   Alcohol use: No    Alcohol/week: 0.0 standard drinks   Drug use: Not Currently    Types: Marijuana     Allergies   Aspirin, Allopurinol, Amlodipine, Lisinopril, and Other   Review of Systems Review of Systems  Constitutional: Positive for activity change.  Gastrointestinal: Positive for abdominal pain.  All other systems reviewed and are negative.    Physical Exam Updated Vital Signs BP (!) 193/81    Pulse 80    Temp 98.2 F (36.8 C) (Oral)    Resp 20    Ht 5\' 8"  (1.727 m)    Wt 116.8 kg    SpO2 100%    BMI 39.15 kg/m   Physical Exam Vitals signs and nursing note reviewed.  Constitutional:      Appearance: She is well-developed.  HENT:     Head: Normocephalic and atraumatic.  Neck:     Musculoskeletal: Normal range of motion and neck supple.  Cardiovascular:     Rate and Rhythm: Normal rate.  Pulmonary:     Effort: Pulmonary effort is normal.  Abdominal:     General: Bowel sounds are normal.     Tenderness: There is generalized abdominal tenderness. There is guarding. There is no rebound.    Skin:    General: Skin is warm and dry.  Neurological:     Mental Status: She is alert and oriented to person, place, and time.      ED Treatments / Results  Labs (all labs ordered are listed, but only abnormal results are displayed) Labs Reviewed  BASIC METABOLIC PANEL - Abnormal; Notable for the following components:      Result Value   Creatinine, Ser 2.28 (*)    Calcium 8.5 (*)    GFR calc non Af Amer 22 (*)    GFR calc Af Amer 25 (*)    All other components within normal limits  CBC WITH DIFFERENTIAL/PLATELET - Abnormal; Notable for the following components:   RBC 3.08 (*)    Hemoglobin 8.2 (*)    HCT 27.3 (*)    All other components within normal limits  URINALYSIS, ROUTINE W REFLEX MICROSCOPIC - Abnormal; Notable for the following components:   Protein, ur >=300 (*)    All other components within normal limits  BRAIN NATRIURETIC PEPTIDE - Abnormal; Notable for the following components:   B Natriuretic Peptide 1,366.9 (*)    All other components within normal limits  LACTIC ACID, PLASMA  LACTIC ACID, PLASMA    EKG None  Radiology Ct Abdomen Pelvis Wo Contrast  Result Date: 11/27/2018 CLINICAL DATA:  68 year old female with abdominal distention and nausea. EXAM: CT ABDOMEN AND PELVIS WITHOUT CONTRAST TECHNIQUE: Multidetector CT imaging of the abdomen and pelvis was performed following the standard protocol without IV contrast. COMPARISON:  CT of the abdomen pelvis dated 11/08/2018 FINDINGS: Evaluation of this exam is limited in the absence of intravenous contrast. Lower chest: The visualized lung bases are clear. There is hypoattenuation of the cardiac blood pool suggestive of a degree of anemia. Clinical correlation is recommended. Coronary vascular calcification noted. No intra-abdominal free air or free fluid. Hepatobiliary: The liver is unremarkable. Layering stone noted in the gallbladder. No pericholecystic fluid or evidence of acute cholecystitis  by CT. Pancreas:  Unremarkable. No pancreatic ductal dilatation or surrounding inflammatory changes. Spleen: Normal in size without focal abnormality. Adrenals/Urinary Tract: The adrenal glands are unremarkable. There is no hydronephrosis or nephrolithiasis on either side. The visualized ureters and urinary bladder appear unremarkable. Stomach/Bowel: Moderate stool throughout the colon. There is no bowel obstruction or active inflammation. The appendix is normal. Vascular/Lymphatic: Mild aortoiliac atherosclerotic disease. The IVC is grossly unremarkable. No portal venous gas. There is no adenopathy. Reproductive: The uterus is retroflexed. Probable small uterine fibroids. The ovaries are grossly unremarkable. Other: There is diffuse thickening of the skin over the anterior pelvic wall and diffuse subcutaneous edema. No fluid collection. Musculoskeletal: Severe bilateral hip osteoarthritic changes. Degenerative changes of the spine. No acute osseous pathology. IMPRESSION: 1. No acute intra-abdominal or pelvic pathology. No bowel obstruction or active inflammation. Normal appendix. 2. Cholelithiasis. Aortic Atherosclerosis (ICD10-I70.0). Electronically Signed   By: Anner Crete M.D.   On: 11/27/2018 20:22   Dg Chest Port 1 View  Result Date: 11/27/2018 CLINICAL DATA:  Evaluate edema EXAM: PORTABLE CHEST 1 VIEW COMPARISON:  November 14, 2018 FINDINGS: There is mild cardiomegaly. There is mild pulmonary vascular congestion. No large airspace consolidation or effusion. IMPRESSION: Mild cardiomegaly and pulmonary vascular congestion. Electronically Signed   By: Prudencio Pair M.D.   On: 11/27/2018 19:31    Procedures Procedures (including critical care time)  Medications Ordered in ED Medications  fentaNYL (SUBLIMAZE) injection 50 mcg (50 mcg Intravenous Given 11/27/18 2050)  ondansetron (ZOFRAN) injection 4 mg (4 mg Intravenous Given 11/27/18 2049)     Initial Impression / Assessment and Plan / ED Course  I have reviewed  the triage vital signs and the nursing notes.  Pertinent labs & imaging results that were available during my care of the patient were reviewed by me and considered in my medical decision making (see chart for details).        DDx includes: Pancreatitis Hepatobiliary pathology including cholecystitis Gastritis/PUD SBO Diverticulitis Cystitis Pyelonephritis Nephrolithiasis  Patient comes in a chief complaint of abdominal pain.  She reports that the pain has been constant and severe for the last couple of days.  There is no specific aggravating relieving factors.  History is not suggestive of any specific diagnosis.  She has significant tenderness on the left lower quadrant.  She has significant comorbidity burden.  Based on the exam showing focal discomfort, I doubt patient has mesenteric ischemia.  She has CKD, therefore we will not be able to get a CT scan with IV contrast.  Reassessment:  CT scan is not showing any acute changes. I called patient's spouse at [phone] and there was nobody who picked up.  Home phone and mobile phone number listed the same.  The patient appears reasonably screened and/or stabilized for discharge and I doubt any other medical condition or other [facility] requiring further screening, evaluation, or treatment in the ED at this time prior to discharge.   Results from the ER workup discussed with the patient face to face and all questions answered to the best of my ability. The patient is safe for discharge with strict return precautions.    Final Clinical Impressions(s) / ED Diagnoses   Final diagnoses:  LLQ abdominal pain    ED Discharge Orders         Ordered    acetaminophen (TYLENOL) 325 MG tablet  Every 6 hours PRN     11/27/18 2334           Saraiya Kozma,  Fatmata Legere, MD 11/27/18 2342

## 2018-11-30 DIAGNOSIS — M25561 Pain in right knee: Secondary | ICD-10-CM | POA: Diagnosis not present

## 2018-11-30 DIAGNOSIS — Z96659 Presence of unspecified artificial knee joint: Secondary | ICD-10-CM | POA: Diagnosis not present

## 2018-11-30 DIAGNOSIS — Z9660 Presence of unspecified orthopedic joint implant: Secondary | ICD-10-CM | POA: Diagnosis not present

## 2018-11-30 DIAGNOSIS — I5021 Acute systolic (congestive) heart failure: Secondary | ICD-10-CM | POA: Diagnosis not present

## 2018-12-04 DIAGNOSIS — I5032 Chronic diastolic (congestive) heart failure: Secondary | ICD-10-CM | POA: Diagnosis not present

## 2018-12-04 DIAGNOSIS — I130 Hypertensive heart and chronic kidney disease with heart failure and stage 1 through stage 4 chronic kidney disease, or unspecified chronic kidney disease: Secondary | ICD-10-CM | POA: Diagnosis not present

## 2018-12-04 DIAGNOSIS — M1990 Unspecified osteoarthritis, unspecified site: Secondary | ICD-10-CM | POA: Diagnosis not present

## 2018-12-04 DIAGNOSIS — J45909 Unspecified asthma, uncomplicated: Secondary | ICD-10-CM | POA: Diagnosis not present

## 2018-12-04 DIAGNOSIS — K219 Gastro-esophageal reflux disease without esophagitis: Secondary | ICD-10-CM | POA: Diagnosis not present

## 2018-12-04 DIAGNOSIS — N184 Chronic kidney disease, stage 4 (severe): Secondary | ICD-10-CM | POA: Diagnosis not present

## 2018-12-04 DIAGNOSIS — E785 Hyperlipidemia, unspecified: Secondary | ICD-10-CM | POA: Diagnosis not present

## 2018-12-04 DIAGNOSIS — E1140 Type 2 diabetes mellitus with diabetic neuropathy, unspecified: Secondary | ICD-10-CM | POA: Diagnosis not present

## 2018-12-04 DIAGNOSIS — M1A30X0 Chronic gout due to renal impairment, unspecified site, without tophus (tophi): Secondary | ICD-10-CM | POA: Diagnosis not present

## 2018-12-04 DIAGNOSIS — D631 Anemia in chronic kidney disease: Secondary | ICD-10-CM | POA: Diagnosis not present

## 2018-12-04 DIAGNOSIS — E1165 Type 2 diabetes mellitus with hyperglycemia: Secondary | ICD-10-CM | POA: Diagnosis not present

## 2018-12-04 DIAGNOSIS — E1122 Type 2 diabetes mellitus with diabetic chronic kidney disease: Secondary | ICD-10-CM | POA: Diagnosis not present

## 2018-12-04 DIAGNOSIS — G4730 Sleep apnea, unspecified: Secondary | ICD-10-CM | POA: Diagnosis not present

## 2018-12-05 DIAGNOSIS — E1169 Type 2 diabetes mellitus with other specified complication: Secondary | ICD-10-CM | POA: Diagnosis not present

## 2018-12-05 DIAGNOSIS — N189 Chronic kidney disease, unspecified: Secondary | ICD-10-CM | POA: Diagnosis not present

## 2018-12-05 DIAGNOSIS — I10 Essential (primary) hypertension: Secondary | ICD-10-CM | POA: Diagnosis not present

## 2018-12-05 DIAGNOSIS — K828 Other specified diseases of gallbladder: Secondary | ICD-10-CM | POA: Diagnosis not present

## 2018-12-05 DIAGNOSIS — M1611 Unilateral primary osteoarthritis, right hip: Secondary | ICD-10-CM | POA: Diagnosis not present

## 2018-12-05 DIAGNOSIS — K219 Gastro-esophageal reflux disease without esophagitis: Secondary | ICD-10-CM | POA: Diagnosis not present

## 2018-12-05 DIAGNOSIS — D649 Anemia, unspecified: Secondary | ICD-10-CM | POA: Diagnosis not present

## 2018-12-05 DIAGNOSIS — E7800 Pure hypercholesterolemia, unspecified: Secondary | ICD-10-CM | POA: Diagnosis not present

## 2018-12-05 DIAGNOSIS — I5032 Chronic diastolic (congestive) heart failure: Secondary | ICD-10-CM | POA: Diagnosis not present

## 2018-12-05 DIAGNOSIS — N183 Chronic kidney disease, stage 3 (moderate): Secondary | ICD-10-CM | POA: Diagnosis not present

## 2018-12-05 DIAGNOSIS — M62830 Muscle spasm of back: Secondary | ICD-10-CM | POA: Diagnosis not present

## 2018-12-05 DIAGNOSIS — K5900 Constipation, unspecified: Secondary | ICD-10-CM | POA: Diagnosis not present

## 2018-12-10 DIAGNOSIS — G4730 Sleep apnea, unspecified: Secondary | ICD-10-CM | POA: Diagnosis not present

## 2018-12-10 DIAGNOSIS — I5032 Chronic diastolic (congestive) heart failure: Secondary | ICD-10-CM | POA: Diagnosis not present

## 2018-12-10 DIAGNOSIS — I130 Hypertensive heart and chronic kidney disease with heart failure and stage 1 through stage 4 chronic kidney disease, or unspecified chronic kidney disease: Secondary | ICD-10-CM | POA: Diagnosis not present

## 2018-12-10 DIAGNOSIS — K219 Gastro-esophageal reflux disease without esophagitis: Secondary | ICD-10-CM | POA: Diagnosis not present

## 2018-12-10 DIAGNOSIS — E1165 Type 2 diabetes mellitus with hyperglycemia: Secondary | ICD-10-CM | POA: Diagnosis not present

## 2018-12-10 DIAGNOSIS — M1A30X0 Chronic gout due to renal impairment, unspecified site, without tophus (tophi): Secondary | ICD-10-CM | POA: Diagnosis not present

## 2018-12-10 DIAGNOSIS — N184 Chronic kidney disease, stage 4 (severe): Secondary | ICD-10-CM | POA: Diagnosis not present

## 2018-12-10 DIAGNOSIS — M1990 Unspecified osteoarthritis, unspecified site: Secondary | ICD-10-CM | POA: Diagnosis not present

## 2018-12-10 DIAGNOSIS — E1140 Type 2 diabetes mellitus with diabetic neuropathy, unspecified: Secondary | ICD-10-CM | POA: Diagnosis not present

## 2018-12-10 DIAGNOSIS — E1122 Type 2 diabetes mellitus with diabetic chronic kidney disease: Secondary | ICD-10-CM | POA: Diagnosis not present

## 2018-12-10 DIAGNOSIS — J45909 Unspecified asthma, uncomplicated: Secondary | ICD-10-CM | POA: Diagnosis not present

## 2018-12-10 DIAGNOSIS — E785 Hyperlipidemia, unspecified: Secondary | ICD-10-CM | POA: Diagnosis not present

## 2018-12-10 DIAGNOSIS — D631 Anemia in chronic kidney disease: Secondary | ICD-10-CM | POA: Diagnosis not present

## 2018-12-11 DIAGNOSIS — E785 Hyperlipidemia, unspecified: Secondary | ICD-10-CM | POA: Diagnosis not present

## 2018-12-11 DIAGNOSIS — D631 Anemia in chronic kidney disease: Secondary | ICD-10-CM | POA: Diagnosis not present

## 2018-12-11 DIAGNOSIS — J45909 Unspecified asthma, uncomplicated: Secondary | ICD-10-CM | POA: Diagnosis not present

## 2018-12-11 DIAGNOSIS — E1165 Type 2 diabetes mellitus with hyperglycemia: Secondary | ICD-10-CM | POA: Diagnosis not present

## 2018-12-11 DIAGNOSIS — M1990 Unspecified osteoarthritis, unspecified site: Secondary | ICD-10-CM | POA: Diagnosis not present

## 2018-12-11 DIAGNOSIS — K219 Gastro-esophageal reflux disease without esophagitis: Secondary | ICD-10-CM | POA: Diagnosis not present

## 2018-12-11 DIAGNOSIS — G4730 Sleep apnea, unspecified: Secondary | ICD-10-CM | POA: Diagnosis not present

## 2018-12-11 DIAGNOSIS — E1140 Type 2 diabetes mellitus with diabetic neuropathy, unspecified: Secondary | ICD-10-CM | POA: Diagnosis not present

## 2018-12-11 DIAGNOSIS — E1122 Type 2 diabetes mellitus with diabetic chronic kidney disease: Secondary | ICD-10-CM | POA: Diagnosis not present

## 2018-12-11 DIAGNOSIS — N184 Chronic kidney disease, stage 4 (severe): Secondary | ICD-10-CM | POA: Diagnosis not present

## 2018-12-11 DIAGNOSIS — I130 Hypertensive heart and chronic kidney disease with heart failure and stage 1 through stage 4 chronic kidney disease, or unspecified chronic kidney disease: Secondary | ICD-10-CM | POA: Diagnosis not present

## 2018-12-11 DIAGNOSIS — I5032 Chronic diastolic (congestive) heart failure: Secondary | ICD-10-CM | POA: Diagnosis not present

## 2018-12-11 DIAGNOSIS — M1A30X0 Chronic gout due to renal impairment, unspecified site, without tophus (tophi): Secondary | ICD-10-CM | POA: Diagnosis not present

## 2018-12-20 ENCOUNTER — Emergency Department (HOSPITAL_COMMUNITY): Payer: PPO

## 2018-12-20 ENCOUNTER — Other Ambulatory Visit: Payer: Self-pay

## 2018-12-20 ENCOUNTER — Encounter (HOSPITAL_COMMUNITY): Payer: Self-pay | Admitting: Emergency Medicine

## 2018-12-20 ENCOUNTER — Inpatient Hospital Stay (HOSPITAL_COMMUNITY)
Admission: EM | Admit: 2018-12-20 | Discharge: 2018-12-22 | DRG: 194 | Disposition: A | Discharge status: Home Health | Payer: PPO | Attending: Family Medicine | Admitting: Family Medicine

## 2018-12-20 DIAGNOSIS — E119 Type 2 diabetes mellitus without complications: Secondary | ICD-10-CM

## 2018-12-20 DIAGNOSIS — G4730 Sleep apnea, unspecified: Secondary | ICD-10-CM | POA: Diagnosis not present

## 2018-12-20 DIAGNOSIS — N183 Chronic kidney disease, stage 3 unspecified: Secondary | ICD-10-CM | POA: Diagnosis not present

## 2018-12-20 DIAGNOSIS — Z7984 Long term (current) use of oral hypoglycemic drugs: Secondary | ICD-10-CM

## 2018-12-20 DIAGNOSIS — J9811 Atelectasis: Secondary | ICD-10-CM

## 2018-12-20 DIAGNOSIS — N1832 Chronic kidney disease, stage 3b: Secondary | ICD-10-CM

## 2018-12-20 DIAGNOSIS — I10 Essential (primary) hypertension: Secondary | ICD-10-CM | POA: Diagnosis not present

## 2018-12-20 DIAGNOSIS — R251 Tremor, unspecified: Secondary | ICD-10-CM | POA: Diagnosis present

## 2018-12-20 DIAGNOSIS — Z6836 Body mass index (BMI) 36.0-36.9, adult: Secondary | ICD-10-CM

## 2018-12-20 DIAGNOSIS — F17210 Nicotine dependence, cigarettes, uncomplicated: Secondary | ICD-10-CM | POA: Diagnosis present

## 2018-12-20 DIAGNOSIS — Z809 Family history of malignant neoplasm, unspecified: Secondary | ICD-10-CM

## 2018-12-20 DIAGNOSIS — I491 Atrial premature depolarization: Secondary | ICD-10-CM | POA: Diagnosis not present

## 2018-12-20 DIAGNOSIS — R0602 Shortness of breath: Secondary | ICD-10-CM | POA: Diagnosis not present

## 2018-12-20 DIAGNOSIS — Z720 Tobacco use: Secondary | ICD-10-CM | POA: Diagnosis not present

## 2018-12-20 DIAGNOSIS — K219 Gastro-esophageal reflux disease without esophagitis: Secondary | ICD-10-CM | POA: Diagnosis present

## 2018-12-20 DIAGNOSIS — J189 Pneumonia, unspecified organism: Secondary | ICD-10-CM | POA: Diagnosis not present

## 2018-12-20 DIAGNOSIS — E66813 Obesity, class 3: Secondary | ICD-10-CM | POA: Diagnosis present

## 2018-12-20 DIAGNOSIS — R9431 Abnormal electrocardiogram [ECG] [EKG]: Secondary | ICD-10-CM | POA: Diagnosis not present

## 2018-12-20 DIAGNOSIS — E1129 Type 2 diabetes mellitus with other diabetic kidney complication: Secondary | ICD-10-CM

## 2018-12-20 DIAGNOSIS — Z9119 Patient's noncompliance with other medical treatment and regimen: Secondary | ICD-10-CM

## 2018-12-20 DIAGNOSIS — R079 Chest pain, unspecified: Secondary | ICD-10-CM | POA: Diagnosis not present

## 2018-12-20 DIAGNOSIS — E1122 Type 2 diabetes mellitus with diabetic chronic kidney disease: Secondary | ICD-10-CM | POA: Diagnosis not present

## 2018-12-20 DIAGNOSIS — I5032 Chronic diastolic (congestive) heart failure: Secondary | ICD-10-CM | POA: Diagnosis present

## 2018-12-20 DIAGNOSIS — R300 Dysuria: Secondary | ICD-10-CM | POA: Diagnosis not present

## 2018-12-20 DIAGNOSIS — J45909 Unspecified asthma, uncomplicated: Secondary | ICD-10-CM | POA: Diagnosis present

## 2018-12-20 DIAGNOSIS — I160 Hypertensive urgency: Secondary | ICD-10-CM | POA: Diagnosis not present

## 2018-12-20 DIAGNOSIS — I5033 Acute on chronic diastolic (congestive) heart failure: Secondary | ICD-10-CM | POA: Diagnosis present

## 2018-12-20 DIAGNOSIS — R1032 Left lower quadrant pain: Secondary | ICD-10-CM | POA: Diagnosis present

## 2018-12-20 DIAGNOSIS — M810 Age-related osteoporosis without current pathological fracture: Secondary | ICD-10-CM | POA: Diagnosis not present

## 2018-12-20 DIAGNOSIS — E785 Hyperlipidemia, unspecified: Secondary | ICD-10-CM | POA: Diagnosis present

## 2018-12-20 DIAGNOSIS — Z888 Allergy status to other drugs, medicaments and biological substances status: Secondary | ICD-10-CM

## 2018-12-20 DIAGNOSIS — K449 Diaphragmatic hernia without obstruction or gangrene: Secondary | ICD-10-CM | POA: Diagnosis not present

## 2018-12-20 DIAGNOSIS — R1084 Generalized abdominal pain: Secondary | ICD-10-CM | POA: Diagnosis not present

## 2018-12-20 DIAGNOSIS — D638 Anemia in other chronic diseases classified elsewhere: Secondary | ICD-10-CM

## 2018-12-20 DIAGNOSIS — Z825 Family history of asthma and other chronic lower respiratory diseases: Secondary | ICD-10-CM | POA: Diagnosis not present

## 2018-12-20 DIAGNOSIS — R069 Unspecified abnormalities of breathing: Secondary | ICD-10-CM | POA: Diagnosis not present

## 2018-12-20 DIAGNOSIS — F1210 Cannabis abuse, uncomplicated: Secondary | ICD-10-CM | POA: Diagnosis not present

## 2018-12-20 DIAGNOSIS — Z20828 Contact with and (suspected) exposure to other viral communicable diseases: Secondary | ICD-10-CM | POA: Diagnosis present

## 2018-12-20 DIAGNOSIS — Z823 Family history of stroke: Secondary | ICD-10-CM | POA: Diagnosis not present

## 2018-12-20 DIAGNOSIS — D631 Anemia in chronic kidney disease: Secondary | ICD-10-CM | POA: Diagnosis present

## 2018-12-20 DIAGNOSIS — Z8249 Family history of ischemic heart disease and other diseases of the circulatory system: Secondary | ICD-10-CM

## 2018-12-20 DIAGNOSIS — R809 Proteinuria, unspecified: Secondary | ICD-10-CM

## 2018-12-20 DIAGNOSIS — Z886 Allergy status to analgesic agent status: Secondary | ICD-10-CM

## 2018-12-20 DIAGNOSIS — E1142 Type 2 diabetes mellitus with diabetic polyneuropathy: Secondary | ICD-10-CM | POA: Diagnosis present

## 2018-12-20 DIAGNOSIS — K8020 Calculus of gallbladder without cholecystitis without obstruction: Secondary | ICD-10-CM | POA: Diagnosis present

## 2018-12-20 DIAGNOSIS — Z8261 Family history of arthritis: Secondary | ICD-10-CM | POA: Diagnosis not present

## 2018-12-20 DIAGNOSIS — R52 Pain, unspecified: Secondary | ICD-10-CM | POA: Diagnosis not present

## 2018-12-20 DIAGNOSIS — I130 Hypertensive heart and chronic kidney disease with heart failure and stage 1 through stage 4 chronic kidney disease, or unspecified chronic kidney disease: Secondary | ICD-10-CM | POA: Diagnosis not present

## 2018-12-20 LAB — CREATININE, SERUM
Creatinine, Ser: 1.99 mg/dL — ABNORMAL HIGH (ref 0.44–1.00)
GFR calc Af Amer: 30 mL/min — ABNORMAL LOW (ref >60)
GFR calc non Af Amer: 26 mL/min — ABNORMAL LOW (ref >60)

## 2018-12-20 LAB — CBC WITH DIFFERENTIAL/PLATELET
Abs Immature Granulocytes: 0.03 10*3/uL (ref 0.00–0.07)
Basophils Absolute: 0 10*3/uL (ref 0.0–0.1)
Basophils Relative: 0 %
Eosinophils Absolute: 0.3 10*3/uL (ref 0.0–0.5)
Eosinophils Relative: 3 %
HCT: 28.3 % — ABNORMAL LOW (ref 36.0–46.0)
Hemoglobin: 8.8 g/dL — ABNORMAL LOW (ref 12.0–15.0)
Immature Granulocytes: 0 %
Lymphocytes Relative: 21 %
Lymphs Abs: 1.9 10*3/uL (ref 0.7–4.0)
MCH: 27 pg (ref 26.0–34.0)
MCHC: 31.1 g/dL (ref 30.0–36.0)
MCV: 86.8 fL (ref 80.0–100.0)
Monocytes Absolute: 0.7 10*3/uL (ref 0.1–1.0)
Monocytes Relative: 7 %
Neutro Abs: 6 10*3/uL (ref 1.7–7.7)
Neutrophils Relative %: 69 %
Platelets: 231 10*3/uL (ref 150–400)
RBC: 3.26 MIL/uL — ABNORMAL LOW (ref 3.87–5.11)
RDW: 15.3 % (ref 11.5–15.5)
WBC: 8.9 10*3/uL (ref 4.0–10.5)
nRBC: 0 % (ref 0.0–0.2)

## 2018-12-20 LAB — BRAIN NATRIURETIC PEPTIDE: B Natriuretic Peptide: 1171.5 pg/mL — ABNORMAL HIGH (ref 0.0–100.0)

## 2018-12-20 LAB — LIPID PANEL
Cholesterol: 103 mg/dL (ref 0–200)
HDL: 35 mg/dL — ABNORMAL LOW (ref >40)
LDL Cholesterol: 54 mg/dL (ref 0–99)
Total CHOL/HDL Ratio: 2.9 RATIO
Triglycerides: 68 mg/dL (ref <150)
VLDL: 14 mg/dL (ref 0–40)

## 2018-12-20 LAB — COMPREHENSIVE METABOLIC PANEL
ALT: 11 U/L (ref 0–44)
AST: 13 U/L — ABNORMAL LOW (ref 15–41)
Albumin: 3 g/dL — ABNORMAL LOW (ref 3.5–5.0)
Alkaline Phosphatase: 81 U/L (ref 38–126)
Anion gap: 9 (ref 5–15)
BUN: 20 mg/dL (ref 8–23)
CO2: 22 mmol/L (ref 22–32)
Calcium: 8.7 mg/dL — ABNORMAL LOW (ref 8.9–10.3)
Chloride: 110 mmol/L (ref 98–111)
Creatinine, Ser: 2.01 mg/dL — ABNORMAL HIGH (ref 0.44–1.00)
GFR calc Af Amer: 29 mL/min — ABNORMAL LOW (ref >60)
GFR calc non Af Amer: 25 mL/min — ABNORMAL LOW (ref >60)
Glucose, Bld: 124 mg/dL — ABNORMAL HIGH (ref 70–99)
Potassium: 4.3 mmol/L (ref 3.5–5.1)
Sodium: 141 mmol/L (ref 135–145)
Total Bilirubin: 0.4 mg/dL (ref 0.3–1.2)
Total Protein: 6.6 g/dL (ref 6.5–8.1)

## 2018-12-20 LAB — CBC
HCT: 27.3 % — ABNORMAL LOW (ref 36.0–46.0)
Hemoglobin: 8.4 g/dL — ABNORMAL LOW (ref 12.0–15.0)
MCH: 26.5 pg (ref 26.0–34.0)
MCHC: 30.8 g/dL (ref 30.0–36.0)
MCV: 86.1 fL (ref 80.0–100.0)
Platelets: 206 10*3/uL (ref 150–400)
RBC: 3.17 MIL/uL — ABNORMAL LOW (ref 3.87–5.11)
RDW: 15.2 % (ref 11.5–15.5)
WBC: 8.4 10*3/uL (ref 4.0–10.5)
nRBC: 0 % (ref 0.0–0.2)

## 2018-12-20 LAB — URINALYSIS, ROUTINE W REFLEX MICROSCOPIC
Bacteria, UA: NONE SEEN
Bilirubin Urine: NEGATIVE
Glucose, UA: 50 mg/dL — AB
Ketones, ur: NEGATIVE mg/dL
Leukocytes,Ua: NEGATIVE
Nitrite: NEGATIVE
Protein, ur: >=300 mg/dL — AB
Specific Gravity, Urine: 1.011 (ref 1.005–1.030)
pH: 6 (ref 5.0–8.0)

## 2018-12-20 LAB — PHOSPHORUS: Phosphorus: 3.1 mg/dL (ref 2.5–4.6)

## 2018-12-20 LAB — MAGNESIUM: Magnesium: 2 mg/dL (ref 1.7–2.4)

## 2018-12-20 LAB — GLUCOSE, CAPILLARY
Glucose-Capillary: 119 mg/dL — ABNORMAL HIGH (ref 70–99)
Glucose-Capillary: 110 mg/dL — ABNORMAL HIGH (ref 70–99)

## 2018-12-20 LAB — TSH: TSH: 1.359 u[IU]/mL (ref 0.350–4.500)

## 2018-12-20 LAB — HEMOGLOBIN A1C
Hgb A1c MFr Bld: 6.2 % — ABNORMAL HIGH (ref 4.8–5.6)
Mean Plasma Glucose: 131.24 mg/dL

## 2018-12-20 LAB — TROPONIN I (HIGH SENSITIVITY)
Troponin I (High Sensitivity): 28 ng/L — ABNORMAL HIGH (ref <18)
Troponin I (High Sensitivity): 30 ng/L — ABNORMAL HIGH (ref <18)

## 2018-12-20 LAB — HIV ANTIBODY (ROUTINE TESTING W REFLEX): HIV Screen 4th Generation wRfx: NONREACTIVE

## 2018-12-20 LAB — LIPASE, BLOOD: Lipase: 23 U/L (ref 11–51)

## 2018-12-20 MED ORDER — HYDRALAZINE HCL 50 MG PO TABS
75.0000 mg | ORAL_TABLET | Freq: Three times a day (TID) | ORAL | Status: DC
  Administered 2018-12-20 – 2018-12-22 (×6): 75 mg via ORAL
  Filled 2018-12-20: qty 1
  Filled 2018-12-20: qty 3
  Filled 2018-12-20: qty 1
  Filled 2018-12-20: qty 3
  Filled 2018-12-20 (×2): qty 1

## 2018-12-20 MED ORDER — CARVEDILOL 12.5 MG PO TABS
12.5000 mg | ORAL_TABLET | Freq: Two times a day (BID) | ORAL | Status: DC
  Administered 2018-12-20 – 2018-12-22 (×4): 12.5 mg via ORAL
  Filled 2018-12-20 (×4): qty 1

## 2018-12-20 MED ORDER — SODIUM CHLORIDE 0.9 % IV SOLN
1.0000 g | Freq: Once | INTRAVENOUS | Status: AC
  Administered 2018-12-20: 1 g via INTRAVENOUS
  Filled 2018-12-20: qty 10

## 2018-12-20 MED ORDER — INSULIN ASPART 100 UNIT/ML ~~LOC~~ SOLN: 0.0000 [IU] | Freq: Three times a day (TID) | SUBCUTANEOUS | Status: DC

## 2018-12-20 MED ORDER — ALBUTEROL SULFATE (2.5 MG/3ML) 0.083% IN NEBU: 3.0000 mL | INHALATION_SOLUTION | Freq: Four times a day (QID) | RESPIRATORY_TRACT | Status: DC | PRN

## 2018-12-20 MED ORDER — ATORVASTATIN CALCIUM 40 MG PO TABS
40.0000 mg | ORAL_TABLET | Freq: Every day | ORAL | Status: DC
  Administered 2018-12-20 – 2018-12-22 (×3): 40 mg via ORAL
  Filled 2018-12-20 (×3): qty 1

## 2018-12-20 MED ORDER — SODIUM CHLORIDE 0.9 % IV SOLN
500.0000 mg | Freq: Once | INTRAVENOUS | Status: AC
  Administered 2018-12-20: 500 mg via INTRAVENOUS
  Filled 2018-12-20: qty 500

## 2018-12-20 MED ORDER — SODIUM CHLORIDE 0.9 % IV SOLN
2.0000 g | INTRAVENOUS | Status: DC
  Filled 2018-12-20 (×2): qty 20

## 2018-12-20 MED ORDER — PANTOPRAZOLE SODIUM 40 MG IV SOLR
40.0000 mg | INTRAVENOUS | Status: DC
  Administered 2018-12-20 – 2018-12-21 (×2): 40 mg via INTRAVENOUS
  Filled 2018-12-20 (×2): qty 40

## 2018-12-20 MED ORDER — INSULIN ASPART 100 UNIT/ML ~~LOC~~ SOLN: 0.0000 [IU] | Freq: Every day | SUBCUTANEOUS | Status: DC

## 2018-12-20 MED ORDER — ISOSORBIDE MONONITRATE ER 60 MG PO TB24
60.0000 mg | ORAL_TABLET | Freq: Every day | ORAL | Status: DC
  Administered 2018-12-20 – 2018-12-22 (×3): 60 mg via ORAL
  Filled 2018-12-20: qty 2
  Filled 2018-12-20 (×2): qty 1

## 2018-12-20 MED ORDER — ENOXAPARIN SODIUM 30 MG/0.3ML ~~LOC~~ SOLN
30.0000 mg | SUBCUTANEOUS | Status: DC
  Administered 2018-12-20: 30 mg via SUBCUTANEOUS
  Filled 2018-12-20: qty 0.3

## 2018-12-20 MED ORDER — POLYETHYLENE GLYCOL 3350 17 G PO PACK: 17.0000 g | PACK | Freq: Every day | ORAL | Status: DC | PRN

## 2018-12-20 MED ORDER — HYDRALAZINE HCL 20 MG/ML IJ SOLN: 10.0000 mg | Freq: Four times a day (QID) | INTRAMUSCULAR | Status: DC | PRN

## 2018-12-20 MED ORDER — OXYCODONE-ACETAMINOPHEN 5-325 MG PO TABS
0.5000 | ORAL_TABLET | Freq: Four times a day (QID) | ORAL | Status: DC | PRN
  Administered 2018-12-20 – 2018-12-21 (×3): 0.5 via ORAL
  Filled 2018-12-20 (×3): qty 1

## 2018-12-20 MED ORDER — SODIUM CHLORIDE 0.9 % IV SOLN
500.0000 mg | INTRAVENOUS | Status: DC
  Administered 2018-12-21: 500 mg via INTRAVENOUS
  Filled 2018-12-20 (×3): qty 500

## 2018-12-20 MED ORDER — FUROSEMIDE 40 MG PO TABS
40.0000 mg | ORAL_TABLET | Freq: Two times a day (BID) | ORAL | Status: DC
  Administered 2018-12-20 – 2018-12-22 (×4): 40 mg via ORAL
  Filled 2018-12-20 (×2): qty 1
  Filled 2018-12-20: qty 2
  Filled 2018-12-20: qty 1

## 2018-12-20 NOTE — ED Provider Notes (Signed)
[facility] EMERGENCY DEPARTMENT Provider Note   CSN: [id number] Arrival date & time: 12/20/18  1113     History   Chief Complaint Chief Complaint  Patient presents with   Abdominal Pain    HPI Zoe Clarke is a 68 y.o. female.     HPI Patient presents with concern of both dyspnea and abdominal pain.  Patient has multiple medical issues including CHF, CKD, hypertension, and known gallstones. Notably, the patient is scheduled for elective cholecystectomy earlier this year, but procedure was unable to be performed secondary to the patient's other comorbidities. Since about that time she has had I am going abdominal pain, though it is worse today, with pain in the infracostal region, possibly left greater than right, and left lower quadrant.  Pain is sharp, severe, sore, dull, pressure-like. There is associated nausea, anorexia, dyspnea, and cough.  No diarrhea, no vomiting. Patient has tried to be compliant with medication, but due to fatigue, pain, dyspnea, she has been inconsistently so. 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 ([place]) 09/22/2018   Thyroid nodule 09/22/2018   Anemia, chronic renal failure, stage 3 (moderate) 09/22/2018   Sleep apnea 09/22/2018   Precordial  chest pain    Symptomatic cholelithiasis 07/30/2018   Acute on chronic combined systolic (congestive) and diastolic (congestive) heart failure ([place]) 07/29/2018   Dyspnea 07/29/2018   Tobacco abuse counseling 04/27/2018   Acute pyelonephritis 04/10/2018   Hypertensive urgency 04/10/2018   Leukocytosis 09/10/2017   Anemia 09/10/2017   Knee pain, bilateral 01/13/2015   Bilateral hip pain 11/23/2014   Diabetes mellitus type 2, uncontrolled, with complications ([place]) 27/08/2374   Intertrigo 10/25/2014   GERD (gastroesophageal reflux disease) 10/25/2014   Diabetic neuropathy ([place]) 10/25/2014   Chronic low back pain 10/25/2014   Current tobacco use 11/11/2013   Stage 3 chronic kidney disease due to type 2 diabetes mellitus ([place]) 09/02/2013   Essential (primary) hypertension 09/02/2013   Gout 12/05/2012    Past Surgical History:  Procedure Laterality Date   DILATION AND CURETTAGE OF UTERUS     EYE SURGERY Left    removed cataract w/ lens   TUBAL LIGATION       OB History   No obstetric history on file.      Home Medications    Prior to Admission medications   Medication Sig Start Date End Date Taking? Authorizing Provider  acetaminophen (TYLENOL) 325 MG tablet Take 2 tablets (650 mg total) by mouth every 6 (six) hours as needed. 11/27/18   Varney Biles, MD  albuterol (PROVENTIL HFA;VENTOLIN HFA) 108 (90 Base) MCG/ACT inhaler Inhale 2 puffs into the lungs every 6 (six) hours as needed for wheezing or shortness of breath. 04/14/18   Ogbata,  Babs Bertin, MD  atorvastatin (LIPITOR) 40 MG tablet Take 40 mg by mouth daily.    [provider]  carvedilol (COREG) 12.5 MG tablet Take 1 tablet (12.5 mg total) by mouth 2 (two) times daily with a meal. 09/30/18   Pokhrel, Laxman, MD  cyclobenzaprine (FLEXERIL) 5 MG tablet Take 5-10 mg by mouth at bedtime.     [provider]  ergocalciferol (VITAMIN D2) 1.25 MG (50000 UT) capsule Take 50,000 Units by  mouth once a week. Friday    [provider]  fexofenadine (ALLEGRA) 180 MG tablet Take 180 mg by mouth as needed for allergies.     [provider]  furosemide (LASIX) 40 MG tablet Take 1 tablet (40 mg total) by mouth 2 (two) times daily. 11/11/18 12/11/18  Arrien, Jimmy Picket, MD  gabapentin (NEURONTIN) 300 MG capsule Take 900 mg by mouth 3 (three) times daily.     [provider]  glipiZIDE (GLUCOTROL XL) 10 MG 24 hr tablet Take 10 mg by mouth daily with breakfast.     [provider]  hydrALAZINE (APRESOLINE) 25 MG tablet Take 3 tablets (75 mg total) by mouth 3 (three) times daily. 09/30/18 11/17/18  Pokhrel, Corrie Mckusick, MD  hydrocortisone cream 1 % Apply topically 2 (two) times daily. Areas of itch Patient taking differently: Apply 1 application topically as needed for itching. Areas of itch 09/30/18   Pokhrel, Corrie Mckusick, MD  isosorbide mononitrate (IMDUR) 60 MG 24 hr tablet Take 1 tablet (60 mg total) by mouth daily. 10/01/18 11/17/18  Pokhrel, Corrie Mckusick, MD  linaclotide (LINZESS) 145 MCG CAPS capsule Take 145 mcg by mouth as needed (constipation).    [provider]  oxyCODONE-acetaminophen (PERCOCET/ROXICET) 5-325 MG tablet Take 0.5 tablets by mouth every 6 (six) hours as needed for moderate pain or severe pain.     [provider]  pantoprazole (PROTONIX) 40 MG tablet Take 1 tablet (40 mg total) by mouth daily. 07/14/18   Skeet Latch, MD  polyethylene glycol [facility] / [name] packet Take 17 g by mouth daily as needed for mild constipation. 04/14/18   Dana Allan I, MD  triamcinolone cream (KENALOG) 0.1 % Apply 1 application topically daily as needed (itching). Back    [provider]  triamcinolone ointment (KENALOG) 0.1 % Apply 1 application topically 2 (two) times daily. Gel on inside of ear    [provider]    Family History Family History  Problem Relation Age of Onset   Arthritis Mother    Hypertension  Mother    Heart failure Mother    Emphysema Mother    Arthritis Maternal Grandmother    Hypertension Maternal Grandmother    Stroke Maternal Grandfather    Cancer Sister        unknown type cancer   Heart failure Sister    Lung disease Sister    Heart failure Sister    Colon cancer Neg Hx    Esophageal cancer Neg Hx    Stomach cancer Neg Hx    Rectal cancer Neg Hx     Social History Social History   Tobacco Use   Smoking status: Current Every Day Smoker    Packs/day: 0.50    Years: 39.00    Pack years: 19.50    Types: Cigarettes   Smokeless tobacco: Never Used   Tobacco comment: 1 pack last 3 days  Substance Use Topics   Alcohol use: No    Alcohol/week: 0.0 standard drinks   Drug use: Not  Currently    Types: Marijuana     Allergies   Aspirin, Allopurinol, Amlodipine, Lisinopril, and Other   Review of Systems Review of Systems  Constitutional:       Per HPI, otherwise negative  HENT:       Per HPI, otherwise negative  Respiratory:       Per HPI, otherwise negative  Cardiovascular:       Per HPI, otherwise negative  Gastrointestinal: Negative for vomiting.  Endocrine:       Negative aside from HPI  Genitourinary:       Neg aside from HPI   Musculoskeletal:       Per HPI, otherwise negative  Skin: Negative.   Neurological: Negative for syncope.     Physical Exam Updated Vital Signs BP (!) 183/88 (BP Location: Right Arm)    Pulse 77    Temp 98.6 F (37 C) (Oral)    Resp (!) 25    Ht 5\' 8"  (1.727 m)    Wt 109.8 kg    SpO2 95%    BMI 36.80 kg/m   Physical Exam Vitals signs and nursing note reviewed.  Constitutional:      Appearance: She is ill-appearing.     Comments: Uncomfortable appearing adult female awake and alert  HENT:     Head: Normocephalic and atraumatic.  Eyes:     Conjunctiva/sclera: Conjunctivae normal.  Cardiovascular:     Rate and Rhythm: Normal rate and regular rhythm.  Pulmonary:     Effort: Pulmonary effort  is normal. Tachypnea present. No respiratory distress.     Breath sounds: Normal breath sounds. No stridor.  Abdominal:     General: There is no distension.     Tenderness: There is abdominal tenderness in the suprapubic area, left upper quadrant and left lower quadrant. Negative signs include Murphy's sign.  Skin:    General: Skin is warm and dry.  Neurological:     Mental Status: She is alert and oriented to person, place, and time.     Cranial Nerves: No cranial nerve deficit.      ED Treatments / Results  Labs (all labs ordered are listed, but only abnormal results are displayed) Labs Reviewed  COMPREHENSIVE METABOLIC PANEL - Abnormal; Notable for the following components:      Result Value   Glucose, Bld 124 (*)    Creatinine, Ser 2.01 (*)    Calcium 8.7 (*)    Albumin 3.0 (*)    AST 13 (*)    GFR calc non Af Amer 25 (*)    GFR calc Af Amer 29 (*)    All other components within normal limits  CBC WITH DIFFERENTIAL/PLATELET - Abnormal; Notable for the following components:   RBC 3.26 (*)    Hemoglobin 8.8 (*)    HCT 28.3 (*)    All other components within normal limits  URINALYSIS, ROUTINE W REFLEX MICROSCOPIC - Abnormal; Notable for the following components:   Color, Urine STRAW (*)    Glucose, UA 50 (*)    Hgb urine dipstick SMALL (*)    Protein, ur >=300 (*)    All other components within normal limits  BRAIN NATRIURETIC PEPTIDE - Abnormal; Notable for the following components:   B Natriuretic Peptide 1,171.5 (*)    All other components within normal limits  SARS CORONAVIRUS 2 (TAT 6-24 HRS)  LIPASE, BLOOD    EKG EKG Interpretation  Date/Time:  Saturday December 20 2018 11:18:40 EDT Ventricular Rate:  85  PR Interval:    QRS Duration: 81 QT Interval:  408 QTC Calculation: 486 R Axis:   43 Text Interpretation:  Sinus rhythm Atrial premature complexes Short PR interval Anteroseptal infarct, old Abnormal ECG Confirmed by Carmin Muskrat [phone]) on 12/20/2018  11:42:59 AM   Radiology Ct Abdomen Pelvis Wo Contrast  Result Date: 12/20/2018 CLINICAL DATA:  Abdominal pain EXAM: CT ABDOMEN AND PELVIS WITHOUT CONTRAST TECHNIQUE: Multidetector CT imaging of the abdomen and pelvis was performed following the standard protocol without IV contrast. COMPARISON:  11/08/2018 FINDINGS: Lower chest: Trace right pleural fluid. Hepatobiliary: Unenhanced liver is unremarkable. Layering gallstones (series 3/image 45), without associated inflammatory changes. No intrahepatic or extrahepatic ductal dilatation. Pancreas: Within normal limits. Spleen: Within normal limits. Adrenals/Urinary Tract: Adrenal glands are within normal limits. Kidneys are within normal limits.  No hydronephrosis. Thick-walled bladder, although underdistended. Stomach/Bowel: Stomach is within normal limits. No evidence of bowel obstruction. Normal appendix (series 3/image 53). Vascular/Lymphatic: No evidence of abdominal aortic aneurysm. Atherosclerotic calcifications of the abdominal aorta and branch vessels. No suspicious abdominopelvic lymphadenopathy. Reproductive: Uterine fibroids. Bilateral ovaries are within normal limits. Other: No abdominopelvic ascites. Musculoskeletal: Mild degenerative changes of the visualized thoracolumbar spine. IMPRESSION: Cholelithiasis, without evidence of acute cholecystitis on CT. No evidence of bowel obstruction.  Normal appendix. Uterine fibroids. Electronically Signed   By: Julian Hy M.D.   On: 12/20/2018 14:05   Dg Chest Port 1 View  Result Date: 12/20/2018 CLINICAL DATA:  Sharp chest pain EXAM: PORTABLE CHEST 1 VIEW COMPARISON:  11/27/2018 FINDINGS: Right lower lobe opacity, atelectasis versus pneumonia. Increased interstitial markings. Possible small right pleural effusion. Cardiomegaly. IMPRESSION: Right lower lobe opacity, atelectasis versus pneumonia. Increased interstitial markings may reflect mild interstitial edema with possible small right pleural  effusion. Cardiomegaly. Electronically Signed   By: Julian Hy M.D.   On: 12/20/2018 12:34    Procedures Procedures (including critical care time)  Medications Ordered in ED Medications  azithromycin (ZITHROMAX) 500 mg in sodium chloride 0.9 % 250 mL IVPB (has no administration in time range)  cefTRIAXone (ROCEPHIN) 1 g in sodium chloride 0.9 % 100 mL IVPB (has no administration in time range)     Initial Impression / Assessment and Plan / ED Course  I have reviewed the triage vital signs and the nursing notes.  Pertinent labs & imaging results that were available during my care of the patient were reviewed by me and considered in my medical decision making (see chart for details).     Chart review performed after initial visit, notable for multiple recent outpatient evaluations and hospitalization last month.   On repeat exam patient is in similar condition, with tachypnea, tachycardia, increased work of breathing.    2:48 PM On repeat exam the patient continues to have tachypnea, increased work of breathing. Labs are generally reassuring, though she does have evidence for persistent chronic kidney disease, slightly elevated BNP With concern for possible pneumonia given her history of CHF, increased work of breathing, the patient will start antibiotics. Given her substantial comorbidities, she will be admitted for further monitoring, management. Abdominal CT generally reassuring, no evidence for acute cholecystitis, no evidence for urinary tract infection, no evidence for diverticulitis.  Final Clinical Impressions(s) / ED Diagnoses   Final diagnoses:  None    ED Discharge Orders    None       Carmin Muskrat, MD 12/21/18 [phone]

## 2018-12-20 NOTE — H&P (Signed)
History and Physical    Lilianna Case UVO:[id number] DOB: 02-15-1951 DOA: 12/20/2018  PCP: Antony Contras, MD  Patient coming from: Home I have personally briefly reviewed patient's old medical records in [place]  Chief Complaint: Generalized body ache, productive cough, not feeling well and shortness of breath.  HPI: Debborah Alonge is a 68 y.o. female with medical history significant of chronic diastolic congestive heart failure, chronic kidney disease stage III, hypertension, cholelithiasis, morbid obesity, tobacco abuse presents to emergency department with multiple medical problems.  She reports that she is not feeling well has right upper quadrant pain, productive cough with whitish sputum, shortness of breath-all symptoms started this morning.  She denies fever, chills, wheezing, runny nose, sore throat, headache, lightheadedness, dizziness, blurry vision, recent COVID-19 exposure, loss of sense of smell or taste.  Has chronic diastolic congestive heart failure.  Reports leg swelling and shortness of breath however denies orthopnea, PND, chest pain or palpitation.  Reports been compliant with medication.  She has chronic cholelithiasis.  Reports right upper quadrant pain, severe in intensity, no aggravating or relieving factors, non radiating, denies association with food, nausea or vomiting.  Reports dysuria and lower abdominal pain since 5 days.  Denies hematuria, urgency, frequency, previous history of urinary tract infection.  She has hypertension however she is noncompliant with her blood pressure medicines-she tells me that her blood pressure medicines gives her headache.  She lives with her husband and smokes 5 to 7 cigarettes/day, denies alcohol, however uses marijuana-last dose was yesterday.  She uses walker for ambulation at home.  ED Course: Her blood pressure elevated upon arrival.  proBNP is elevated but below baseline, lipase: WNL.  CT abdomen/pelvis  shows cholelithiasis, no cholecystitis no bowel obstruction.  Chest x-ray shows: Right lower lobe opacity, atelectasis versus pneumonia.  Increased interstitial markings may reflect mild interstitial edema with possible small right pleural effusion.  Cardiomegaly.  COVID-19: Pending.  She received IV Rocephin and azithromycin in ED.   Review of Systems: As per HPI otherwise negative.    Past Medical History:  Diagnosis Date  . Allergy   . Arthritis   . Asthma    weazing at night uses inhalers  . Bronchitis    history of  . Chronic diastolic CHF (congestive heart failure) ([place]) 04/2018  . CKD (chronic kidney disease), stage III   . Diabetes mellitus without complication ([place])   . Gastric ulcer   . GERD (gastroesophageal reflux disease)   . History of hiatal hernia   . Hypertension   . Osteoporosis   . Sleep apnea 09/22/2018    Past Surgical History:  Procedure Laterality Date  . DILATION AND CURETTAGE OF UTERUS    . EYE SURGERY Left    removed cataract w/ lens  . TUBAL LIGATION       reports that she has been smoking cigarettes. She has a 19.50 pack-year smoking history. She has never used smokeless tobacco. She reports previous drug use. Drug: Marijuana. She reports that she does not drink alcohol.  Allergies  Allergen Reactions  . Aspirin Other (See Comments)    Feels funny   . Allopurinol     Dizzy; hard to ambulate  Couldn't see; sweating  . Amlodipine Other (See Comments)    headache  . Lisinopril Swelling    Face swelling  . Other Itching    Squash    Family History  Problem Relation Age of Onset  . Arthritis Mother   . Hypertension Mother   .  Heart failure Mother   . Emphysema Mother   . Arthritis Maternal Grandmother   . Hypertension Maternal Grandmother   . Stroke Maternal Grandfather   . Cancer Sister        unknown type cancer  . Heart failure Sister   . Lung disease Sister   . Heart failure Sister   . Colon cancer Neg Hx   . Esophageal cancer  Neg Hx   . Stomach cancer Neg Hx   . Rectal cancer Neg Hx     Prior to Admission medications   Medication Sig Start Date End Date Taking? Authorizing Provider  acetaminophen (TYLENOL) 325 MG tablet Take 2 tablets (650 mg total) by mouth every 6 (six) hours as needed. 11/27/18   Varney Biles, MD  albuterol (PROVENTIL HFA;VENTOLIN HFA) 108 (90 Base) MCG/ACT inhaler Inhale 2 puffs into the lungs every 6 (six) hours as needed for wheezing or shortness of breath. 04/14/18   Bonnell Public, MD  atorvastatin (LIPITOR) 40 MG tablet Take 40 mg by mouth daily.    [provider]  carvedilol (COREG) 12.5 MG tablet Take 1 tablet (12.5 mg total) by mouth 2 (two) times daily with a meal. 09/30/18   Pokhrel, Laxman, MD  cyclobenzaprine (FLEXERIL) 5 MG tablet Take 5-10 mg by mouth at bedtime.     [provider]  ergocalciferol (VITAMIN D2) 1.25 MG (50000 UT) capsule Take 50,000 Units by mouth once a week. Friday    [provider]  fexofenadine (ALLEGRA) 180 MG tablet Take 180 mg by mouth as needed for allergies.     [provider]  furosemide (LASIX) 40 MG tablet Take 1 tablet (40 mg total) by mouth 2 (two) times daily. 11/11/18 12/11/18  Arrien, Jimmy Picket, MD  gabapentin (NEURONTIN) 300 MG capsule Take 900 mg by mouth 3 (three) times daily.     [provider]  glipiZIDE (GLUCOTROL XL) 10 MG 24 hr tablet Take 10 mg by mouth daily with breakfast.     [provider]  hydrALAZINE (APRESOLINE) 25 MG tablet Take 3 tablets (75 mg total) by mouth 3 (three) times daily. 09/30/18 11/17/18  Pokhrel, Corrie Mckusick, MD  hydrocortisone cream 1 % Apply topically 2 (two) times daily. Areas of itch Patient taking differently: Apply 1 application topically as needed for itching. Areas of itch 09/30/18   Pokhrel, Corrie Mckusick, MD  isosorbide mononitrate (IMDUR) 60 MG 24 hr tablet Take 1 tablet (60 mg total) by mouth daily. 10/01/18 11/17/18  Pokhrel, Corrie Mckusick, MD  linaclotide  (LINZESS) 145 MCG CAPS capsule Take 145 mcg by mouth as needed (constipation).    [provider]  oxyCODONE-acetaminophen (PERCOCET/ROXICET) 5-325 MG tablet Take 0.5 tablets by mouth every 6 (six) hours as needed for moderate pain or severe pain.     [provider]  pantoprazole (PROTONIX) 40 MG tablet Take 1 tablet (40 mg total) by mouth daily. 07/14/18   Skeet Latch, MD  polyethylene glycol [facility] [name] packet Take 17 g by mouth daily as needed for mild constipation. 04/14/18   Dana Allan I, MD  triamcinolone cream (KENALOG) 0.1 % Apply 1 application topically daily as needed (itching). Back    [provider]  triamcinolone ointment (KENALOG) 0.1 % Apply 1 application topically 2 (two) times daily. Gel on inside of ear    [provider]    Physical Exam: Vitals:   12/20/18 1330 12/20/18 1421 12/20/18 1430 12/20/18 1500  BP: (!) 163/110 (!) 183/88 (!) 195/110 Marland Kitchen)  186/95  Pulse: 79 77    Resp: 17 (!) 25 (!) 24   Temp:      TempSrc:      SpO2: 98% 95%    Weight:      Height:        Constitutional: NAD, calm, comfortable Vitals:   12/20/18 1330 12/20/18 1421 12/20/18 1430 12/20/18 1500  BP: (!) 163/110 (!) 183/88 (!) 195/110 (!) 186/95  Pulse: 79 77    Resp: 17 (!) 25 (!) 24   Temp:      TempSrc:      SpO2: 98% 95%    Weight:      Height:       Constitutional: Alert and oriented x3, tearful during the encounter, not in acute distress, communicating well.   Eyes: PERRL, lids and conjunctivae normal ENMT: Mucous membranes are moist. Posterior pharynx clear of any exudate or lesions.Normal dentition.  Neck: normal, supple, no masses, no thyromegaly Respiratory: clear to auscultation bilaterally, no wheezing, no crackles. Normal respiratory effort. No accessory muscle use.  Cardiovascular: Regular rate and rhythm, no murmurs / rubs / gallops.  1+ pitting edema positive in bilateral lower extremities.  2+ pedal pulses. No  carotid bruits.  Abdomen: Right upper quadrant tenderness positive, no guarding, no rigidity, mild left lower quadrant and lower abdominal tenderness positive.  Bowel sounds positive, no hepatosplenomegaly.   Musculoskeletal: no clubbing / cyanosis. No joint deformity upper and lower extremities. Good ROM, no contractures. Normal muscle tone.  Skin: no rashes, lesions, ulcers. No induration Neurologic: CN 2-12 grossly intact. Sensation intact, DTR normal. Strength 5/5 in all 4.  Psychiatric: Normal judgment and insight. Alert and oriented x 3. Normal mood.    Labs on Admission: I have personally reviewed following labs and imaging studies  CBC: Recent Labs  Lab 12/20/18 1232  WBC 8.9  NEUTROABS 6.0  HGB 8.8*  HCT 28.3*  MCV 86.8  PLT 284   Basic Metabolic Panel: Recent Labs  Lab 12/20/18 1232  NA 141  K 4.3  CL 110  CO2 22  GLUCOSE 124*  BUN 20  CREATININE 2.01*  CALCIUM 8.7*   GFR: Estimated Creatinine Clearance: 35.8 mL/min (A) (by C-G formula based on SCr of 2.01 mg/dL (H)). Liver Function Tests: Recent Labs  Lab 12/20/18 1232  AST 13*  ALT 11  ALKPHOS 81  BILITOT 0.4  PROT 6.6  ALBUMIN 3.0*   Recent Labs  Lab 12/20/18 1232  LIPASE 23   No results for input(s): AMMONIA in the last 168 hours. Coagulation Profile: No results for input(s): INR, PROTIME in the last 168 hours. Cardiac Enzymes: No results for input(s): CKTOTAL, CKMB, CKMBINDEX, TROPONINI in the last 168 hours. BNP (last 3 results) No results for input(s): PROBNP in the last 8760 hours. HbA1C: No results for input(s): HGBA1C in the last 72 hours. CBG: No results for input(s): GLUCAP in the last 168 hours. Lipid Profile: No results for input(s): CHOL, HDL, LDLCALC, TRIG, CHOLHDL, LDLDIRECT in the last 72 hours. Thyroid Function Tests: No results for input(s): TSH, T4TOTAL, FREET4, T3FREE, THYROIDAB in the last 72 hours. Anemia Panel: No results for input(s): VITAMINB12, FOLATE, FERRITIN,  TIBC, IRON, RETICCTPCT in the last 72 hours. Urine analysis:    Component Value Date/Time   COLORURINE STRAW (A) 12/20/2018 1215   APPEARANCEUR CLEAR 12/20/2018 1215   LABSPEC 1.011 12/20/2018 1215   PHURINE 6.0 12/20/2018 1215   GLUCOSEU 50 (A) 12/20/2018 1215   HGBUR SMALL (A) 12/20/2018 1215  BILIRUBINUR NEGATIVE 12/20/2018 1215   BILIRUBINUR negative 02/25/2015 1426   KETONESUR NEGATIVE 12/20/2018 1215   PROTEINUR >=300 (A) 12/20/2018 1215   UROBILINOGEN 0.2 02/25/2015 1426   UROBILINOGEN 1.0 04/16/2009 0943   NITRITE NEGATIVE 12/20/2018 1215   LEUKOCYTESUR NEGATIVE 12/20/2018 1215    Radiological Exams on Admission: Ct Abdomen Pelvis Wo Contrast  Result Date: 12/20/2018 CLINICAL DATA:  Abdominal pain EXAM: CT ABDOMEN AND PELVIS WITHOUT CONTRAST TECHNIQUE: Multidetector CT imaging of the abdomen and pelvis was performed following the standard protocol without IV contrast. COMPARISON:  11/08/2018 FINDINGS: Lower chest: Trace right pleural fluid. Hepatobiliary: Unenhanced liver is unremarkable. Layering gallstones (series 3/image 45), without associated inflammatory changes. No intrahepatic or extrahepatic ductal dilatation. Pancreas: Within normal limits. Spleen: Within normal limits. Adrenals/Urinary Tract: Adrenal glands are within normal limits. Kidneys are within normal limits.  No hydronephrosis. Thick-walled bladder, although underdistended. Stomach/Bowel: Stomach is within normal limits. No evidence of bowel obstruction. Normal appendix (series 3/image 53). Vascular/Lymphatic: No evidence of abdominal aortic aneurysm. Atherosclerotic calcifications of the abdominal aorta and branch vessels. No suspicious abdominopelvic lymphadenopathy. Reproductive: Uterine fibroids. Bilateral ovaries are within normal limits. Other: No abdominopelvic ascites. Musculoskeletal: Mild degenerative changes of the visualized thoracolumbar spine. IMPRESSION: Cholelithiasis, without evidence of acute  cholecystitis on CT. No evidence of bowel obstruction.  Normal appendix. Uterine fibroids. Electronically Signed   By: Julian Hy M.D.   On: 12/20/2018 14:05   Dg Chest Port 1 View  Result Date: 12/20/2018 CLINICAL DATA:  Sharp chest pain EXAM: PORTABLE CHEST 1 VIEW COMPARISON:  11/27/2018 FINDINGS: Right lower lobe opacity, atelectasis versus pneumonia. Increased interstitial markings. Possible small right pleural effusion. Cardiomegaly. IMPRESSION: Right lower lobe opacity, atelectasis versus pneumonia. Increased interstitial markings may reflect mild interstitial edema with possible small right pleural effusion. Cardiomegaly. Electronically Signed   By: Julian Hy M.D.   On: 12/20/2018 12:34    EKG: Sinus rhythm, premature arterial complexes, no acute ST-T wave elevation or depression noted.  Assessment/Plan Principal Problem:   CAP (community acquired pneumonia) Active Problems:   Diabetes mellitus ([place])   GERD (gastroesophageal reflux disease)   Tobacco abuse   CKD (chronic kidney disease), stage III   Uncontrolled hypertension   Anemia of chronic disease   Cholelithiasis   Chronic diastolic CHF (congestive heart failure) ([facility])   Obesity, Class III, BMI 40-49.9 (morbid obesity) ([place])   Marijuana abuse   Dysuria  Right lower lobe pneumonia: -Patient presents with productive cough, not feeling well, generalized body ache -Chest x-ray positive for right lower lobe opacity, atelectasis versus pneumonia.  Increased interstitial markings may reflect mild interstitial edema with possible small right pleural effusion. -We will admit patient for close monitoring.  On telemetry. -Received IV Rocephin and azithromycin in ED.  Will continue same. -Obtain blood culture, procalcitonin level, sputum culture, urine Legionella and urine strep pneumo antigen -COVID-19: Pending.  On continuous pulse ox. -Monitor vitals closely.  Chronic diastolic congestive heart failure: -Not in  the acute exacerbation.  proBNP is at below baseline level.  Reviewed chest x-ray. -Reviewed echo from February 2020 which showed normal ejection fraction of 60 to 65% with impaired diastolic relaxation and elevated mean left atrial pressure. -Strict INO's and daily weight. -Continue Lasix 40 mg p.o. twice daily, Imdur, Coreg and statin. -Monitor electrolytes.  Replace potassium and magnesium as indicated.  Hypertension: Uncontrolled -Likely secondary to noncompliance with medication. -Continue Coreg, hydralazine and Imdur and Lasix -Monitor blood pressure closely.  Cholelithiasis: -And presents with right upper quadrant  pain.  CT abdomen/pelvis shows cholelithiasis without cholecystitis.  Lipase: WNL. -Zofran PRN for nausea and vomiting.  Continue home dose of Percocet as needed for moderate to severe pain.  Chronic kidney disease stage IIIb: Stable -Avoid nephrotoxic medication and monitor kidney function closely.  Anemia of chronic disease: -Likely secondary to underlying CKD. -H&H is stable.  Monitor.  Dysuria: -Reviewed UA--negative for leukocytes, nitrites, bacteria.  -Rocephin will cover her symptoms  GERD: We will continue Protonix  Diabetes mellitus: Hold home dose of glipizide -Start on sliding scale insulin and monitor blood sugar closely. -Check A1c.  Hyperlipidemia: Check lipid panel -Continue atorvastatin.  Tobacco abuse: Counseled about cessation  Marijuana abuse: Counseled about cessation.  Morbid obesity with BMI of 36. -Counseled about dietary modification, exercise and weight loss.  DVT prophylaxis: Lovenox/TED/SCD  Code Status: Full code Family Communication: None present at bedside.  Plan of care discussed with patient in length and he verbalized understanding and agreed with it. Disposition Plan: TBD Consults called: None Admission status: Inpatient  Mckinley Jewel MD [facility] Pager [phone]  If 7PM-7AM, please contact  night-coverage [email or web address] Password [email or web address]  12/20/2018, 3:35 PM

## 2018-12-20 NOTE — ED Triage Notes (Signed)
Pt presents from home for abd pain related to "gall baladder issues", has been present since May but became worse last night and "unbearable" 30 min prior to calling EMS. Currently experiencing nausea, dry heaves, SOB, dysuria (5 days). No surgery planned d/t CHF per cardiology.  EMS exam- pain bilat upper quadrants

## 2018-12-20 NOTE — Plan of Care (Signed)

## 2018-12-21 LAB — CBC
HCT: 24.7 % — ABNORMAL LOW (ref 36.0–46.0)
Hemoglobin: 7.9 g/dL — ABNORMAL LOW (ref 12.0–15.0)
MCH: 27 pg (ref 26.0–34.0)
MCHC: 32 g/dL (ref 30.0–36.0)
MCV: 84.3 fL (ref 80.0–100.0)
Platelets: 198 10*3/uL (ref 150–400)
RBC: 2.93 MIL/uL — ABNORMAL LOW (ref 3.87–5.11)
RDW: 15.1 % (ref 11.5–15.5)
WBC: 7.1 10*3/uL (ref 4.0–10.5)
nRBC: 0 % (ref 0.0–0.2)

## 2018-12-21 LAB — GLUCOSE, CAPILLARY
Glucose-Capillary: 109 mg/dL — ABNORMAL HIGH (ref 70–99)
Glucose-Capillary: 106 mg/dL — ABNORMAL HIGH (ref 70–99)
Glucose-Capillary: 99 mg/dL (ref 70–99)
Glucose-Capillary: 109 mg/dL — ABNORMAL HIGH (ref 70–99)

## 2018-12-21 LAB — BASIC METABOLIC PANEL
Anion gap: 7 (ref 5–15)
BUN: 21 mg/dL (ref 8–23)
CO2: 24 mmol/L (ref 22–32)
Calcium: 8.4 mg/dL — ABNORMAL LOW (ref 8.9–10.3)
Chloride: 111 mmol/L (ref 98–111)
Creatinine, Ser: 2.29 mg/dL — ABNORMAL HIGH (ref 0.44–1.00)
GFR calc Af Amer: 25 mL/min — ABNORMAL LOW (ref >60)
GFR calc non Af Amer: 22 mL/min — ABNORMAL LOW (ref >60)
Glucose, Bld: 108 mg/dL — ABNORMAL HIGH (ref 70–99)
Potassium: 4.5 mmol/L (ref 3.5–5.1)
Sodium: 142 mmol/L (ref 135–145)

## 2018-12-21 LAB — TROPONIN I (HIGH SENSITIVITY)
Troponin I (High Sensitivity): 18 ng/L — ABNORMAL HIGH (ref <18)
Troponin I (High Sensitivity): 22 ng/L — ABNORMAL HIGH (ref <18)

## 2018-12-21 LAB — SARS CORONAVIRUS 2 (TAT 6-24 HRS): SARS Coronavirus 2: NEGATIVE

## 2018-12-21 LAB — PROCALCITONIN: Procalcitonin: <0.1 ng/mL

## 2018-12-21 LAB — STREP PNEUMONIAE URINARY ANTIGEN: Strep Pneumo Urinary Antigen: NEGATIVE

## 2018-12-21 MED ORDER — MORPHINE SULFATE (PF) 2 MG/ML IV SOLN
2.0000 mg | Freq: Once | INTRAVENOUS | Status: AC
  Administered 2018-12-21: 2 mg via INTRAVENOUS
  Filled 2018-12-21: qty 1

## 2018-12-21 MED ORDER — ONDANSETRON HCL 4 MG/2ML IJ SOLN
4.0000 mg | Freq: Four times a day (QID) | INTRAMUSCULAR | Status: DC | PRN
  Administered 2018-12-21: 4 mg via INTRAVENOUS
  Filled 2018-12-21: qty 2

## 2018-12-21 NOTE — Progress Notes (Signed)
CRITICAL VALUE ALERT  Critical Value:  Troponin 22  Date & Time Notied: 12/21/2018 2317  Provider Notified: Dr. Kennon Holter  Orders Received/Actions taken:No new orders received at this time.

## 2018-12-21 NOTE — TOC Initial Note (Signed)
Transition of Care [facility]) - Initial/Assessment Note    Patient Details  Name: Zoe Clarke MRN: [id number] Date of Birth: [birth year]  Transition of Care [facility]) CM/SW Contact:    Carles Collet, RN Phone Number: 12/21/2018, 11:24 AM  Clinical Narrative:                 Patient recently [place] in August to [facility], prior notes show patient has RW, WC, shower at home, and lives with spouse. TOC will continue to follow for discharge needs.      Barriers to Discharge: Continued Medical Work up   Patient Goals and CMS Choice        Expected Discharge Plan and Services                                                Prior Living Arrangements/Services                       Activities of Daily Living   ADL Screening (condition at time of admission) Patient's cognitive ability adequate to safely complete daily activities?: Yes Is the patient deaf or have difficulty hearing?: No Does the patient have difficulty seeing, even when wearing glasses/contacts?: No Does the patient have difficulty concentrating, remembering, or making decisions?: No Patient able to express need for assistance with ADLs?: Yes Does the patient have difficulty dressing or bathing?: No Independently performs ADLs?: No Communication: Independent Dressing (OT): Independent Grooming: Independent Feeding: Independent Bathing: Needs assistance Is this a change from baseline?: Pre-admission baseline Toileting: Needs assistance Is this a change from baseline?: Pre-admission baseline In/Out Bed: Needs assistance Is this a change from baseline?: Pre-admission baseline Walks in Home: Needs assistance Is this a change from baseline?: Pre-admission baseline Does the patient have difficulty walking or climbing stairs?: Yes Weakness of Legs: Both Weakness of Arms/Hands: None  Permission Sought/Granted                  Emotional Assessment              Admission diagnosis:   Abd pain Patient Active Problem List   Diagnosis Date Noted  . CAP (community acquired pneumonia) 12/20/2018  . Obesity, Class III, BMI 40-49.9 (morbid obesity) ([place]) 12/20/2018  . Marijuana abuse 12/20/2018  . Dysuria 12/20/2018  . Chronic diastolic CHF (congestive heart failure) ([place]) 11/06/2018  . 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 Thyroid nodule 09/22/2018  . Anemia, chronic renal failure, stage 3 (moderate) 09/22/2018  . Sleep apnea 09/22/2018  . Precordial chest pain   . Cholelithiasis 07/30/2018  . Acute on chronic combined systolic (congestive) and diastolic (congestive) heart failure ([place]) 07/29/2018  . Dyspnea 07/29/2018  . Tobacco abuse counseling 04/27/2018  . Acute pyelonephritis 04/10/2018  . Hypertensive urgency 04/10/2018  . Leukocytosis 09/10/2017  . Anemia of chronic disease 09/10/2017  . Knee pain, bilateral 01/13/2015  . Bilateral hip pain 11/23/2014  . Diabetes mellitus ([place]) 10/25/2014  . Intertrigo 10/25/2014  . GERD (gastroesophageal reflux disease) 10/25/2014  . Diabetic neuropathy ([place]) 10/25/2014  . Chronic low back pain 10/25/2014  . Tobacco abuse 11/11/2013  . CKD (chronic kidney disease), stage III 09/02/2013  . Uncontrolled hypertension 09/02/2013  . Gout 12/05/2012   PCP:  Antony Contras, MD Pharmacy:   [facility] [address] [place] [address] Phone: [phone] Fax: [phone]     Social Determinants of Health (SDOH) Interventions    Readmission Risk Interventions Readmission Risk Prevention Plan 11/10/2018 09/30/2018  Transportation Screening Complete -  [place] or [place] Complete Complete  Social Work Consult for [place] Planning/Counseling Complete -  Palliative Care Screening Not Applicable Not Applicable  Medication Review (RN Care  Manager) Complete -  Some recent data might be hidden

## 2018-12-21 NOTE — Progress Notes (Signed)
PROGRESS NOTE    Zoe Clarke  XQJ:[id number] DOB: 08/10/1950 DOA: 12/20/2018 PCP: Antony Contras, MD   Brief Narrative:  Zoe Clarke is a 68 y.o. female with medical history significant of chronic diastolic congestive heart failure, chronic kidney disease stage III, hypertension, cholelithiasis, morbid obesity, tobacco abuse presented to emergency department on 12/20/2018 with multiple complaints.  She told admitting physician that she is not feeling well, right upper quadrant pain, productive cough with whitish sputum, shortness of breath-all symptoms started the morning of admission.  She denied fever, chills, wheezing, runny nose, sore throat, headache, lightheadedness, dizziness, blurry vision, chest pain, recent COVID-19 exposure, loss of sense of smell or taste or any urinary complaints except some dysuria.  She smokes 5 to 7 cigarettes/day and uses marijuana.  No alcohol use.  Upon arrival to ED, her blood pressure elevated, proBNP elevated but below baseline, lipase: WNL.  CT abdomen/pelvis showed cholelithiasis, no cholecystitis no bowel obstruction.  Chest x-ray shows: Right lower lobe opacity, atelectasis versus pneumonia.  Increased interstitial markings may reflect mild interstitial edema with possible small right pleural effusion.  Cardiomegaly.  COVID-19: Negative.  She received IV Rocephin and azithromycin in ED. she was admitted by [facility] and antibiotics were continued.  Assessment & Plan:   Principal Problem:   CAP (community acquired pneumonia) Active Problems:   Diabetes mellitus ([place])   GERD (gastroesophageal reflux disease)   Tobacco abuse   CKD (chronic kidney disease), stage III   Uncontrolled hypertension   Anemia of chronic disease   Cholelithiasis   Chronic diastolic CHF (congestive heart failure) (H[facility])   Obesity, Class III, BMI 40-49.9 (morbid obesity) ([place])   Marijuana abuse   Dysuria   Community-acquired pneumonia right lower lobe: Patient's  breathing has improved.  She is not hypoxic today.  She is afebrile and not tachypneic.  Urine antigen for streptococci negative.  Blood culture and sputum cultures negative thus far.  Influenza pending.  COVID-19 negative.  Continue Rocephin and Zithromax and follow culture.  Chronic cholelithiasis: She now tells me that she has been suffering with cholelithiasis and intermittent abdominal pain for very long time.  She has also seen Dr. Rush Farmer, general surgery as an outpatient, she was last seen in January.  According to her, she was told by surgeon that she needs surgery however her cardiologist Dr. Oval Linsey would not clear her for the surgery.  Currently pain is 9/10.  No other complaints such as nausea or vomiting.  Chronic peripheral neuropathy: Continue gabapentin.  Follow-up with PCP as an outpatient for alternative options.  Hypertensive urgency: She came in with hypertensive urgency.  She tells me that she takes all her medications except hydralazine since that causes headache.  Her home medications were resumed and her blood pressure is much better now.  Unfortunately, she is allergic to amlodipine.  She is already on high dose of Imdur.  There is some room to increase her carvedilol if needed.  Due to her CKD, she is not a good candidate for ACEIs or ARBs.   CKD stage III: At baseline.  Continue to monitor.  Anemia of chronic disease: Likely secondary to CKD.  Stable.  Watch daily.  Asymptomatic bacteriuria: Does not need to be treated.  Type 2 diabetes mellitus: Continue to glipizide.  Blood sugar controlled.  Continue SSI.  Hyperlipidemia: Continue atorvastatin.  Marijuana and tobacco abuse: Still regarding cessation.  Morbid obesity: Complicates all sorts of care.  Counseled regarding weight loss.  DVT prophylaxis: Lovenox Code  Status: Full code Family Communication:  None present at bedside.  Plan of care discussed with patient in length and she verbalized understanding and  agreed with it. Disposition Plan: Potential discharge tomorrow  Consultants:   None  Procedures:   None  Antimicrobials:   IV Rocephin and Zithromax   Subjective: Patient seen and examined.  Once again she had multiple complaints to talk about.  She continues to complain about her right upper quadrant pain, that was the main complaint.  Once I tried to explain to her that she eventually needs surgery and she is already been seen by surgery as an outpatient so there is nothing that we can do as an inpatient.  She then started talking about her bilateral leg pain.  I did tell her that she probably has peripheral neuropathy.  She tells me that she has been taking gabapentin without any relief.  I did explain to her that she needs to discuss that with her PCP for consideration of alternative therapy.  Objective: Vitals:   12/21/18 0015 12/21/18 0500 12/21/18 0609 12/21/18 0800  BP: (!) 158/86  (!) 164/73 (!) 158/59  Pulse: 86  77 84  Resp: 20  20 16   Temp: 98.7 F (37.1 C)  98.6 F (37 C) 98.3 F (36.8 C)  TempSrc: Oral  Oral Oral  SpO2: 100%  100% 98%  Weight:  108.9 kg    Height:        Intake/Output Summary (Last 24 hours) at 12/21/2018 1036 Last data filed at 12/20/2018 1630 Gross per 24 hour  Intake 250 ml  Output --  Net 250 ml   Filed Weights   12/20/18 1118 12/21/18 0500  Weight: 109.8 kg 108.9 kg    Examination:  General exam: Appears calm and comfortable, morbidly obese Respiratory system: Diminished breath sounds. Respiratory effort normal. Cardiovascular system: S1 & S2 heard, RRR. No JVD, murmurs, rubs, gallops or clicks. No pedal edema. Gastrointestinal system: Abdomen is nondistended, soft and nontender. No organomegaly or masses felt. Normal bowel sounds heard. Central nervous system: Alert and oriented. No focal neurological deficits. Extremities: Symmetric 5 x 5 power. Skin: No rashes, lesions or ulcers Psychiatry: Judgement and insight appear  normal. Mood & affect appropriate.    Data Reviewed: I have personally reviewed following labs and imaging studies  CBC: Recent Labs  Lab 12/20/18 1232 12/20/18 1739 12/21/18 0629  WBC 8.9 8.4 7.1  NEUTROABS 6.0  --   --   HGB 8.8* 8.4* 7.9*  HCT 28.3* 27.3* 24.7*  MCV 86.8 86.1 84.3  PLT 231 206 378   Basic Metabolic Panel: Recent Labs  Lab 12/20/18 1232 12/20/18 1739 12/21/18 0629  NA 141  --  142  K 4.3  --  4.5  CL 110  --  111  CO2 22  --  24  GLUCOSE 124*  --  108*  BUN 20  --  21  CREATININE 2.01* 1.99* 2.29*  CALCIUM 8.7*  --  8.4*  MG  --  2.0  --   PHOS  --  3.1  --    GFR: Estimated Creatinine Clearance: 31.2 mL/min (A) (by C-G formula based on SCr of 2.29 mg/dL (H)). Liver Function Tests: Recent Labs  Lab 12/20/18 1232  AST 13*  ALT 11  ALKPHOS 81  BILITOT 0.4  PROT 6.6  ALBUMIN 3.0*   Recent Labs  Lab 12/20/18 1232  LIPASE 23   No results for input(s): AMMONIA in the last 168 hours. Coagulation  Profile: No results for input(s): INR, PROTIME in the last 168 hours. Cardiac Enzymes: No results for input(s): CKTOTAL, CKMB, CKMBINDEX, TROPONINI in the last 168 hours. BNP (last 3 results) No results for input(s): PROBNP in the last 8760 hours. HbA1C: Recent Labs    12/20/18 1739  HGBA1C 6.2*   CBG: Recent Labs  Lab 12/20/18 1748 12/20/18 2121 12/21/18 0800  GLUCAP 119* 110* 109*   Lipid Profile: Recent Labs    12/20/18 1739  CHOL 103  HDL 35*  LDLCALC 54  TRIG 68  CHOLHDL 2.9   Thyroid Function Tests: Recent Labs    12/20/18 1739  TSH 1.359   Anemia Panel: No results for input(s): VITAMINB12, FOLATE, FERRITIN, TIBC, IRON, RETICCTPCT in the last 72 hours. Sepsis Labs: No results for input(s): PROCALCITON, LATICACIDVEN in the last 168 hours.  Recent Results (from the past 240 hour(s))  SARS CORONAVIRUS 2 (TAT 6-24 HRS) Nasopharyngeal Nasopharyngeal Swab     Status: None   Collection Time: 12/20/18  3:00 PM    Specimen: Nasopharyngeal Swab  Result Value Ref Range Status   SARS Coronavirus 2 NEGATIVE NEGATIVE Final    Comment: (NOTE) SARS-CoV-2 target nucleic acids are NOT DETECTED. The SARS-CoV-2 RNA is generally detectable in upper and lower respiratory specimens during the acute phase of infection. Negative results do not preclude SARS-CoV-2 infection, do not rule out co-infections with other pathogens, and should not be used as the sole basis for treatment or other patient management decisions. Negative results must be combined with clinical observations, patient history, and epidemiological information. The expected result is Negative. Fact Sheet for Patients: SugarRoll.be Fact Sheet for Healthcare Providers: https://www.woods-mathews.com/ This test is not yet approved or cleared by the [place] FDA and  has been authorized for detection and/or diagnosis of SARS-CoV-2 by FDA under an Emergency Use Authorization (EUA). This EUA will remain  in effect (meaning this test can be used) for the duration of the COVID-19 declaration under Section 56 4(b)(1) of the Act, 21 U.S.C. section 360bbb-3(b)(1), unless the authorization is terminated or revoked sooner. Performed at [facility], [place] [address] [place], [place] [address]   Culture, blood (routine x 2) Call MD if unable to obtain prior to antibiotics being given     Status: None (Preliminary result)   Collection Time: 12/20/18  5:39 PM   Specimen: BLOOD LEFT ARM  Result Value Ref Range Status   Specimen Description BLOOD LEFT ARM  Final   Special Requests   Final    BOTTLES DRAWN AEROBIC ONLY Blood Culture adequate volume   Culture   Final    NO GROWTH < 24 HOURS Performed at [facility], [place] [address] [place], [place] [address]    Report Status PENDING  Incomplete  Culture, blood (routine x 2) Call MD if unable to obtain prior to antibiotics being given     Status: None  (Preliminary result)   Collection Time: 12/20/18  5:39 PM   Specimen: BLOOD LEFT ARM  Result Value Ref Range Status   Specimen Description BLOOD LEFT ARM  Final   Special Requests   Final    BOTTLES DRAWN AEROBIC AND ANAEROBIC Blood Culture adequate volume   Culture   Final    NO GROWTH < 24 HOURS Performed at [facility], [place] [address] [place], [place] [address]    Report Status PENDING  Incomplete      Radiology Studies: Ct Abdomen Pelvis Wo Contrast  Result  Date: 12/20/2018 CLINICAL DATA:  Abdominal pain EXAM: CT ABDOMEN AND PELVIS WITHOUT CONTRAST TECHNIQUE: Multidetector CT imaging of the abdomen and pelvis was performed following the standard protocol without IV contrast. COMPARISON:  11/08/2018 FINDINGS: Lower chest: Trace right pleural fluid. Hepatobiliary: Unenhanced liver is unremarkable. Layering gallstones (series 3/image 45), without associated inflammatory changes. No intrahepatic or extrahepatic ductal dilatation. Pancreas: Within normal limits. Spleen: Within normal limits. Adrenals/Urinary Tract: Adrenal glands are within normal limits. Kidneys are within normal limits.  No hydronephrosis. Thick-walled bladder, although underdistended. Stomach/Bowel: Stomach is within normal limits. No evidence of bowel obstruction. Normal appendix (series 3/image 53). Vascular/Lymphatic: No evidence of abdominal aortic aneurysm. Atherosclerotic calcifications of the abdominal aorta and branch vessels. No suspicious abdominopelvic lymphadenopathy. Reproductive: Uterine fibroids. Bilateral ovaries are within normal limits. Other: No abdominopelvic ascites. Musculoskeletal: Mild degenerative changes of the visualized thoracolumbar spine. IMPRESSION: Cholelithiasis, without evidence of acute cholecystitis on CT. No evidence of bowel obstruction.  Normal appendix. Uterine fibroids. Electronically Signed   By: Julian Hy M.D.   On: 12/20/2018 14:05   Dg Chest Port 1 View  Result  Date: 12/20/2018 CLINICAL DATA:  Sharp chest pain EXAM: PORTABLE CHEST 1 VIEW COMPARISON:  11/27/2018 FINDINGS: Right lower lobe opacity, atelectasis versus pneumonia. Increased interstitial markings. Possible small right pleural effusion. Cardiomegaly. IMPRESSION: Right lower lobe opacity, atelectasis versus pneumonia. Increased interstitial markings may reflect mild interstitial edema with possible small right pleural effusion. Cardiomegaly. Electronically Signed   By: Julian Hy M.D.   On: 12/20/2018 12:34    Scheduled Meds:  atorvastatin  40 mg Oral Daily   carvedilol  12.5 mg Oral BID WC   enoxaparin (LOVENOX) injection  30 mg Subcutaneous Q24H   furosemide  40 mg Oral BID   hydrALAZINE  75 mg Oral TID   insulin aspart  0-15 Units Subcutaneous TID WC   insulin aspart  0-5 Units Subcutaneous QHS   isosorbide mononitrate  60 mg Oral Daily   pantoprazole (PROTONIX) IV  40 mg Intravenous Q24H   Continuous Infusions:  azithromycin     cefTRIAXone (ROCEPHIN)  IV       LOS: 1 day   Time spent: 40 minutes   Darliss Cheney, MD [facility]  12/21/2018, 10:36 AM   To contact the attending provider between 7A-7P or the covering provider during after hours 7P-7A, please log into the web site [email or web address] and use password [email or web address].

## 2018-12-22 ENCOUNTER — Telehealth: Payer: Self-pay | Admitting: Cardiovascular Disease

## 2018-12-22 DIAGNOSIS — J9811 Atelectasis: Secondary | ICD-10-CM

## 2018-12-22 LAB — CBC WITH DIFFERENTIAL/PLATELET
Abs Immature Granulocytes: 0.03 10*3/uL (ref 0.00–0.07)
Basophils Absolute: 0 10*3/uL (ref 0.0–0.1)
Basophils Relative: 0 %
Eosinophils Absolute: 0.3 10*3/uL (ref 0.0–0.5)
Eosinophils Relative: 4 %
HCT: 26.6 % — ABNORMAL LOW (ref 36.0–46.0)
Hemoglobin: 8.2 g/dL — ABNORMAL LOW (ref 12.0–15.0)
Immature Granulocytes: 0 %
Lymphocytes Relative: 17 %
Lymphs Abs: 1.2 10*3/uL (ref 0.7–4.0)
MCH: 26.3 pg (ref 26.0–34.0)
MCHC: 30.8 g/dL (ref 30.0–36.0)
MCV: 85.3 fL (ref 80.0–100.0)
Monocytes Absolute: 0.5 10*3/uL (ref 0.1–1.0)
Monocytes Relative: 7 %
Neutro Abs: 5.2 10*3/uL (ref 1.7–7.7)
Neutrophils Relative %: 72 %
Platelets: 208 10*3/uL (ref 150–400)
RBC: 3.12 MIL/uL — ABNORMAL LOW (ref 3.87–5.11)
RDW: 15.5 % (ref 11.5–15.5)
WBC: 7.3 10*3/uL (ref 4.0–10.5)
nRBC: 0 % (ref 0.0–0.2)

## 2018-12-22 LAB — LEGIONELLA PNEUMOPHILA SEROGP 1 UR AG: L. pneumophila Serogp 1 Ur Ag: NEGATIVE

## 2018-12-22 LAB — BASIC METABOLIC PANEL
Anion gap: 9 (ref 5–15)
BUN: 22 mg/dL (ref 8–23)
CO2: 23 mmol/L (ref 22–32)
Calcium: 8.6 mg/dL — ABNORMAL LOW (ref 8.9–10.3)
Chloride: 109 mmol/L (ref 98–111)
Creatinine, Ser: 2.64 mg/dL — ABNORMAL HIGH (ref 0.44–1.00)
GFR calc Af Amer: 21 mL/min — ABNORMAL LOW (ref >60)
GFR calc non Af Amer: 18 mL/min — ABNORMAL LOW (ref >60)
Glucose, Bld: 106 mg/dL — ABNORMAL HIGH (ref 70–99)
Potassium: 4.5 mmol/L (ref 3.5–5.1)
Sodium: 141 mmol/L (ref 135–145)

## 2018-12-22 LAB — GLUCOSE, CAPILLARY
Glucose-Capillary: 104 mg/dL — ABNORMAL HIGH (ref 70–99)
Glucose-Capillary: 113 mg/dL — ABNORMAL HIGH (ref 70–99)
Glucose-Capillary: 100 mg/dL — ABNORMAL HIGH (ref 70–99)

## 2018-12-22 MED ORDER — ALUM & MAG HYDROXIDE-SIMETH 200-200-20 MG/5ML PO SUSP: 15.0000 mL | Freq: Four times a day (QID) | ORAL | Status: DC | PRN

## 2018-12-22 MED ORDER — NITROGLYCERIN 0.4 MG SL SUBL
SUBLINGUAL_TABLET | SUBLINGUAL | Status: AC
  Administered 2018-12-22: 0.8 mg
  Filled 2018-12-22: qty 1

## 2018-12-22 NOTE — Progress Notes (Signed)
Patient called nurse into room at [facility] with c/o chest pain, tremors, and SOB.  Vitals read BP 167/70 HR 95 Pain was 9/10 in the mid chest area with heaviness feeling.  Chest Pain protocol was initiated, EKG was done and 0.4 mg nitroglycerin was administered.  Follow-up vitals were BP 145/71 HR 93 Pain was 10/10 in the left abdomen. Another 0.4 mg nitroglycerin was administered. CBG was obtained to rule-out low blood sugar, CBG resulted 104.  Paged on-call MD, waiting for follow-up. Will continue to monitor.

## 2018-12-22 NOTE — Telephone Encounter (Signed)
° ° ° °  Patients spouse calling very upset, screaming he needs to speak with Dr Oval Linsey asap, regarding the patients BP and gallbladder Advised spouse Dr Oval Linsey is rounding, not in the office to take his call. Patient is admitted to hospital, possible discharge today.

## 2018-12-22 NOTE — Discharge Summary (Signed)
Physician Discharge Summary  Zoe Clarke FXT:[id number] DOB: 1951/01/31 DOA: 12/20/2018  PCP: Antony Contras, MD  Admit date: 12/20/2018 Discharge date: 12/22/2018  Admitted From: Home Disposition: Home  Recommendations for Outpatient Follow-up:  1. Follow up with PCP in 1-2 weeks 2. Follow with general surgery for definitive treatment/surgical treatment for cholelithiasis 3. Follow-up with neurology for further evaluation of intermittent upper body tremors 4. Please obtain BMP/CBC in one week 5. Please follow up on the following pending results:  Home Health: None Equipment/Devices: None  Discharge Condition: Stable CODE STATUS: Full code Diet recommendation: Cardiac  Subjective: Patient seen and examined.  Continues to complain of right upper quadrant slightly better than yesterday.  No other complaint at this point in time however she has obvious upper body gross tremors.  Brief/Interim Summary: Zoe Clarke a 68 y.o.femalewith medical history significant ofchronic diastolic congestive heart failure, chronic kidney disease stage III, hypertension, cholelithiasis, morbid obesity, tobacco abuse presented to emergency department on 12/20/2018 with multiple complaints.  She told admitting physician that she is not feeling well, right upper quadrant pain, productive cough with whitish sputum, shortness of breath-all symptoms started the morning of admission. She denied fever, chills, wheezing, runny nose, sore throat, headache, lightheadedness, dizziness, blurry vision, chest pain, recent COVID-19 exposure, loss of sense of smell or taste or any urinary complaints except some dysuria.  She smokes 5 to 7 cigarettes/day and uses marijuana.  No alcohol use.  Upon arrival to ED, her blood pressure was slightly elevated, proBNP elevated but below baseline, lipase: WNL. CT abdomen/pelvis showed cholelithiasis, no cholecystitis no bowel obstruction. Chest x-ray shows: Right  lower lobe opacity, atelectasis versus pneumonia. Increased interstitial markings may reflect mild interstitial edema with possible small right pleural effusion. Cardiomegaly. COVID-19: Negative. She received IV Rocephin and azithromycin in ED for presumed community-acquired pneumonia. she was admitted by [facility] and antibiotics were continued.  When seen yesterday, patient did not have any worsening of any symptoms.  She told me that she has been dealing with chronic cholelithiasis and right upper quadrant pain for more than a year and that she has seen general surgeon Dr. Ninfa Linden as an outpatient, last seen in February and they have recommended laparoscopic cholecystectomy however she tells me that her cardiologist Dr. Oval Linsey would not clear him for the surgery.  She was medically stable for discharge yesterday but she did not want to go home only because of her chronic symptoms.  She was kept in the hospital yesterday for that reason.  She complained of chest pain yesterday afternoon, this resolved on its own, EKG was done which was normal sinus rhythm with no acute ST-T wave changes.  Serial cardiac enzymes were followed which were slightly elevated and flat.  This happened once again.  Based on the negative cardiac work-up, her chest pain seems to be referred pain from her right upper quadrant pain due to cholelithiasis.  Patient is feeling fine now with no symptoms.  She has upper body cross tremors and she is wondering why she has had.  Upon further questioning, she tells me that this is going on for about 2 months and that she has seen her PCP last month for this problem but she did not get any answer.  I have recommended her to see a neurologist as an outpatient.  At this point in time, she is medically stable although she has chronic symptoms as mentioned above.  She will be discharged home.  Her procalcitonin was checked which was  unremarkable so no antibiotics are being prescribed and stated her chest  x-ray findings suggest atelectasis and she is not hypoxic either.  Discharge Diagnoses:  Active Problems:   Diabetes mellitus (HCC)   GERD (gastroesophageal reflux disease)   Tobacco abuse   CKD (chronic kidney disease), stage III   Uncontrolled hypertension   Anemia of chronic disease   Cholelithiasis   Chronic diastolic CHF (congestive heart failure) (HCC)   Obesity, Class III, BMI 40-49.9 (morbid obesity) ([place])   Marijuana abuse   Dysuria   Atelectasis    Discharge Instructions  Discharge Instructions    Discharge patient   Complete by: As directed    Discharge disposition: 01-Home or Self Care   Discharge patient date: 12/22/2018     Allergies as of 12/22/2018      Reactions   Aspirin Other (See Comments)   Feels funny    Allopurinol    Dizzy; hard to ambulate  Couldn't see; sweating   Amlodipine Other (See Comments)   headache   Lisinopril Swelling   Face swelling   Other Itching   Squash      Medication List    TAKE these medications   acetaminophen 325 MG tablet Commonly known as: Tylenol Take 2 tablets (650 mg total) by mouth every 6 (six) hours as needed. What changed: reasons to take this   albuterol 108 (90 Base) MCG/ACT inhaler Commonly known as: VENTOLIN HFA Inhale 2 puffs into the lungs every 6 (six) hours as needed for wheezing or shortness of breath.   atorvastatin 40 MG tablet Commonly known as: LIPITOR Take 40 mg by mouth daily.   carvedilol 12.5 MG tablet Commonly known as: COREG Take 1 tablet (12.5 mg total) by mouth 2 (two) times daily with a meal.   cyclobenzaprine 5 MG tablet Commonly known as: FLEXERIL Take 5-10 mg by mouth at bedtime.   ergocalciferol 1.25 MG (50000 UT) capsule Commonly known as: VITAMIN D2 Take 50,000 Units by mouth every Friday.   fexofenadine 180 MG tablet Commonly known as: ALLEGRA Take 180 mg by mouth daily as needed for allergies.   furosemide 40 MG tablet Commonly known as: Lasix Take 1 tablet  (40 mg total) by mouth 2 (two) times daily.   gabapentin 300 MG capsule Commonly known as: NEURONTIN Take 900 mg by mouth 3 (three) times daily.   glipiZIDE 10 MG 24 hr tablet Commonly known as: GLUCOTROL XL Take 10 mg by mouth daily with breakfast.   hydrALAZINE 25 MG tablet Commonly known as: APRESOLINE Take 3 tablets (75 mg total) by mouth 3 (three) times daily.   hydrocortisone cream 1 % Apply topically 2 (two) times daily. Areas of itch   isosorbide mononitrate 60 MG 24 hr tablet Commonly known as: IMDUR Take 1 tablet (60 mg total) by mouth daily.   Linzess 145 MCG Caps capsule Generic drug: linaclotide Take 145 mcg by mouth daily as needed (constipation).   multivitamin with minerals Tabs tablet Take 1 tablet by mouth daily.   oxyCODONE-acetaminophen 5-325 MG tablet Commonly known as: PERCOCET/ROXICET Take 0.5 tablets by mouth every 6 (six) hours as needed for moderate pain or severe pain.   pantoprazole 40 MG tablet Commonly known as: PROTONIX Take 1 tablet (40 mg total) by mouth daily.   polyethylene glycol 17 g packet Commonly known as: MIRALAX / GLYCOLAX Take 17 g by mouth daily as needed for mild constipation.   triamcinolone cream 0.1 % Commonly known as: KENALOG Apply 1 application topically  daily as needed (itching on back).   triamcinolone ointment 0.1 % Commonly known as: KENALOG Apply 1 application topically 2 (two) times daily. Gel on inside of ear      Follow-up Information    Antony Contras, MD Follow up in 1 week(s).   Specialty: Family Medicine Contact information: [place] [address] [phone]        Skeet Latch, MD .   Specialty: Cardiology Contact information: [address] [place] [address] [phone]          Allergies  Allergen Reactions  . Aspirin Other (See Comments)    Feels funny   . Allopurinol     Dizzy; hard to ambulate  Couldn't see; sweating  .  Amlodipine Other (See Comments)    headache  . Lisinopril Swelling    Face swelling  . Other Itching    Squash    Consultations: None  Procedures/Studies: Ct Abdomen Pelvis Wo Contrast  Result Date: 12/20/2018 CLINICAL DATA:  Abdominal pain EXAM: CT ABDOMEN AND PELVIS WITHOUT CONTRAST TECHNIQUE: Multidetector CT imaging of the abdomen and pelvis was performed following the standard protocol without IV contrast. COMPARISON:  11/08/2018 FINDINGS: Lower chest: Trace right pleural fluid. Hepatobiliary: Unenhanced liver is unremarkable. Layering gallstones (series 3/image 45), without associated inflammatory changes. No intrahepatic or extrahepatic ductal dilatation. Pancreas: Within normal limits. Spleen: Within normal limits. Adrenals/Urinary Tract: Adrenal glands are within normal limits. Kidneys are within normal limits.  No hydronephrosis. Thick-walled bladder, although underdistended. Stomach/Bowel: Stomach is within normal limits. No evidence of bowel obstruction. Normal appendix (series 3/image 53). Vascular/Lymphatic: No evidence of abdominal aortic aneurysm. Atherosclerotic calcifications of the abdominal aorta and branch vessels. No suspicious abdominopelvic lymphadenopathy. Reproductive: Uterine fibroids. Bilateral ovaries are within normal limits. Other: No abdominopelvic ascites. Musculoskeletal: Mild degenerative changes of the visualized thoracolumbar spine. IMPRESSION: Cholelithiasis, without evidence of acute cholecystitis on CT. No evidence of bowel obstruction.  Normal appendix. Uterine fibroids. Electronically Signed   By: Julian Hy M.D.   On: 12/20/2018 14:05   Ct Abdomen Pelvis Wo Contrast  Result Date: 11/27/2018 CLINICAL DATA:  68 year old female with abdominal distention and nausea. EXAM: CT ABDOMEN AND PELVIS WITHOUT CONTRAST TECHNIQUE: Multidetector CT imaging of the abdomen and pelvis was performed following the standard protocol without IV contrast. COMPARISON:  CT  of the abdomen pelvis dated 11/08/2018 FINDINGS: Evaluation of this exam is limited in the absence of intravenous contrast. Lower chest: The visualized lung bases are clear. There is hypoattenuation of the cardiac blood pool suggestive of a degree of anemia. Clinical correlation is recommended. Coronary vascular calcification noted. No intra-abdominal free air or free fluid. Hepatobiliary: The liver is unremarkable. Layering stone noted in the gallbladder. No pericholecystic fluid or evidence of acute cholecystitis by CT. Pancreas: Unremarkable. No pancreatic ductal dilatation or surrounding inflammatory changes. Spleen: Normal in size without focal abnormality. Adrenals/Urinary Tract: The adrenal glands are unremarkable. There is no hydronephrosis or nephrolithiasis on either side. The visualized ureters and urinary bladder appear unremarkable. Stomach/Bowel: Moderate stool throughout the colon. There is no bowel obstruction or active inflammation. The appendix is normal. Vascular/Lymphatic: Mild aortoiliac atherosclerotic disease. The IVC is grossly unremarkable. No portal venous gas. There is no adenopathy. Reproductive: The uterus is retroflexed. Probable small uterine fibroids. The ovaries are grossly unremarkable. Other: There is diffuse thickening of the skin over the anterior pelvic wall and diffuse subcutaneous edema. No fluid collection. Musculoskeletal: Severe bilateral hip osteoarthritic changes. Degenerative changes of  the spine. No acute osseous pathology. IMPRESSION: 1. No acute intra-abdominal or pelvic pathology. No bowel obstruction or active inflammation. Normal appendix. 2. Cholelithiasis. Aortic Atherosclerosis (ICD10-I70.0). Electronically Signed   By: Anner Crete M.D.   On: 11/27/2018 20:22   Dg Chest Port 1 View  Result Date: 12/20/2018 CLINICAL DATA:  Hervey Ard chest pain EXAM: PORTABLE CHEST 1 VIEW COMPARISON:  11/27/2018 FINDINGS: Right lower lobe opacity, atelectasis versus  pneumonia. Increased interstitial markings. Possible small right pleural effusion. Cardiomegaly. IMPRESSION: Right lower lobe opacity, atelectasis versus pneumonia. Increased interstitial markings may reflect mild interstitial edema with possible small right pleural effusion. Cardiomegaly. Electronically Signed   By: Julian Hy M.D.   On: 12/20/2018 12:34   Dg Chest Port 1 View  Result Date: 11/27/2018 CLINICAL DATA:  Evaluate edema EXAM: PORTABLE CHEST 1 VIEW COMPARISON:  November 14, 2018 FINDINGS: There is mild cardiomegaly. There is mild pulmonary vascular congestion. No large airspace consolidation or effusion. IMPRESSION: Mild cardiomegaly and pulmonary vascular congestion. Electronically Signed   By: Prudencio Pair M.D.   On: 11/27/2018 19:31      Discharge Exam: Vitals:   12/22/18 0435 12/22/18 0800  BP: (!) 145/71 (!) 155/69  Pulse:  92  Resp:  18  Temp:  99 F (37.2 C)  SpO2: 100% 99%   Vitals:   12/22/18 0423 12/22/18 0428 12/22/18 0435 12/22/18 0800  BP: (!) 167/70  (!) 145/71 (!) 155/69  Pulse:    92  Resp: (!) 30 20  18   Temp:    99 F (37.2 C)  TempSrc:    Oral  SpO2: 100%  100% 99%  Weight: 113.8 kg     Height:        General: Pt is alert, awake, not in acute distress Cardiovascular: RRR, S1/S2 +, no rubs, no gallops Respiratory: Decreased aeration in the bases due to poor inspiratory effort and body habitus Abdominal: Soft, NT, ND, bowel sounds + Extremities: no edema, no cyanosis    The results of significant diagnostics from this hospitalization (including imaging, microbiology, ancillary and laboratory) are listed below for reference.     Microbiology: Recent Results (from the past 240 hour(s))  SARS CORONAVIRUS 2 (TAT 6-24 HRS) Nasopharyngeal Nasopharyngeal Swab     Status: None   Collection Time: 12/20/18  3:00 PM   Specimen: Nasopharyngeal Swab  Result Value Ref Range Status   SARS Coronavirus 2 NEGATIVE NEGATIVE Final    Comment:  (NOTE) SARS-CoV-2 target nucleic acids are NOT DETECTED. The SARS-CoV-2 RNA is generally detectable in upper and lower respiratory specimens during the acute phase of infection. Negative results do not preclude SARS-CoV-2 infection, do not rule out co-infections with other pathogens, and should not be used as the sole basis for treatment or other patient management decisions. Negative results must be combined with clinical observations, patient history, and epidemiological information. The expected result is Negative. Fact Sheet for Patients: SugarRoll.be Fact Sheet for Healthcare Providers: https://www.woods-mathews.com/ This test is not yet approved or cleared by the [place] FDA and  has been authorized for detection and/or diagnosis of SARS-CoV-2 by FDA under an Emergency Use Authorization (EUA). This EUA will remain  in effect (meaning this test can be used) for the duration of the COVID-19 declaration under Section 56 4(b)(1) of the Act, 21 U.S.C. section 360bbb-3(b)(1), unless the authorization is terminated or revoked sooner. Performed at [facility], [place] [address], [place], [place] [address]   Culture, blood (routine x 2) Call MD  if unable to obtain prior to antibiotics being given     Status: None (Preliminary result)   Collection Time: 12/20/18  5:39 PM   Specimen: BLOOD LEFT ARM  Result Value Ref Range Status   Specimen Description BLOOD LEFT ARM  Final   Special Requests   Final    BOTTLES DRAWN AEROBIC ONLY Blood Culture adequate volume   Culture   Final    NO GROWTH 2 DAYS Performed at [facility], [address], [place], [place] [address]    Report Status PENDING  Incomplete  Culture, blood (routine x 2) Call MD if unable to obtain prior to antibiotics being given     Status: None (Preliminary result)   Collection Time: 12/20/18  5:39 PM   Specimen: BLOOD LEFT ARM  Result Value Ref Range Status    Specimen Description BLOOD LEFT ARM  Final   Special Requests   Final    BOTTLES DRAWN AEROBIC AND ANAEROBIC Blood Culture adequate volume   Culture   Final    NO GROWTH 2 DAYS Performed at [facility], [place] [address] [place], [place] [address]    Report Status PENDING  Incomplete     Labs: BNP (last 3 results) Recent Labs    11/06/18 1024 11/27/18 2240 12/20/18 1232  BNP 1,845.4* 1,366.9* 7,062.3*   Basic Metabolic Panel: Recent Labs  Lab 12/20/18 1232 12/20/18 1739 12/21/18 0629 12/22/18 0720  NA 141  --  142 141  K 4.3  --  4.5 4.5  CL 110  --  111 109  CO2 22  --  24 23  GLUCOSE 124*  --  108* 106*  BUN 20  --  21 22  CREATININE 2.01* 1.99* 2.29* 2.64*  CALCIUM 8.7*  --  8.4* 8.6*  MG  --  2.0  --   --   PHOS  --  3.1  --   --    Liver Function Tests: Recent Labs  Lab 12/20/18 1232  AST 13*  ALT 11  ALKPHOS 81  BILITOT 0.4  PROT 6.6  ALBUMIN 3.0*   Recent Labs  Lab 12/20/18 1232  LIPASE 23   No results for input(s): AMMONIA in the last 168 hours. CBC: Recent Labs  Lab 12/20/18 1232 12/20/18 1739 12/21/18 0629 12/22/18 0720  WBC 8.9 8.4 7.1 7.3  NEUTROABS 6.0  --   --  5.2  HGB 8.8* 8.4* 7.9* 8.2*  HCT 28.3* 27.3* 24.7* 26.6*  MCV 86.8 86.1 84.3 85.3  PLT 231 206 198 208   Cardiac Enzymes: No results for input(s): CKTOTAL, CKMB, CKMBINDEX, TROPONINI in the last 168 hours. BNP: Invalid input(s): POCBNP CBG: Recent Labs  Lab 12/21/18 1236 12/21/18 1647 12/21/18 2118 12/22/18 0448 12/22/18 0801  GLUCAP 106* 99 109* 104* 113*   D-Dimer No results for input(s): DDIMER in the last 72 hours. Hgb A1c Recent Labs    12/20/18 1739  HGBA1C 6.2*   Lipid Profile Recent Labs    12/20/18 1739  CHOL 103  HDL 35*  LDLCALC 54  TRIG 68  CHOLHDL 2.9   Thyroid function studies Recent Labs    12/20/18 1739  TSH 1.359   Anemia work up No results for input(s): VITAMINB12, FOLATE, FERRITIN, TIBC, IRON, RETICCTPCT in the last  72 hours. Urinalysis    Component Value Date/Time   COLORURINE STRAW (A) 12/20/2018 1215   APPEARANCEUR CLEAR 12/20/2018 1215   LABSPEC 1.011 12/20/2018 1215   PHURINE 6.0 12/20/2018  1215   GLUCOSEU 50 (A) 12/20/2018 1215   HGBUR SMALL (A) 12/20/2018 1215   BILIRUBINUR NEGATIVE 12/20/2018 1215   BILIRUBINUR negative 02/25/2015 1426   KETONESUR NEGATIVE 12/20/2018 1215   PROTEINUR >=300 (A) 12/20/2018 1215   UROBILINOGEN 0.2 02/25/2015 1426   UROBILINOGEN 1.0 04/16/2009 0943   NITRITE NEGATIVE 12/20/2018 1215   LEUKOCYTESUR NEGATIVE 12/20/2018 1215   Sepsis Labs Invalid input(s): PROCALCITONIN,  WBC,  LACTICIDVEN Microbiology Recent Results (from the past 240 hour(s))  SARS CORONAVIRUS 2 (TAT 6-24 HRS) Nasopharyngeal Nasopharyngeal Swab     Status: None   Collection Time: 12/20/18  3:00 PM   Specimen: Nasopharyngeal Swab  Result Value Ref Range Status   SARS Coronavirus 2 NEGATIVE NEGATIVE Final    Comment: (NOTE) SARS-CoV-2 target nucleic acids are NOT DETECTED. The SARS-CoV-2 RNA is generally detectable in upper and lower respiratory specimens during the acute phase of infection. Negative results do not preclude SARS-CoV-2 infection, do not rule out co-infections with other pathogens, and should not be used as the sole basis for treatment or other patient management decisions. Negative results must be combined with clinical observations, patient history, and epidemiological information. The expected result is Negative. Fact Sheet for Patients: SugarRoll.be Fact Sheet for Healthcare Providers: https://www.woods-mathews.com/ This test is not yet approved or cleared by the [place] FDA and  has been authorized for detection and/or diagnosis of SARS-CoV-2 by FDA under an Emergency Use Authorization (EUA). This EUA will remain  in effect (meaning this test can be used) for the duration of the COVID-19 declaration under Section  56 4(b)(1) of the Act, 21 U.S.C. section 360bbb-3(b)(1), unless the authorization is terminated or revoked sooner. Performed at [facility], [place] [address] [place], [place] [address]   Culture, blood (routine x 2) Call MD if unable to obtain prior to antibiotics being given     Status: None (Preliminary result)   Collection Time: 12/20/18  5:39 PM   Specimen: BLOOD LEFT ARM  Result Value Ref Range Status   Specimen Description BLOOD LEFT ARM  Final   Special Requests   Final    BOTTLES DRAWN AEROBIC ONLY Blood Culture adequate volume   Culture   Final    NO GROWTH 2 DAYS Performed at [facility], [address] [place], [place] [address]    Report Status PENDING  Incomplete  Culture, blood (routine x 2) Call MD if unable to obtain prior to antibiotics being given     Status: None (Preliminary result)   Collection Time: 12/20/18  5:39 PM   Specimen: BLOOD LEFT ARM  Result Value Ref Range Status   Specimen Description BLOOD LEFT ARM  Final   Special Requests   Final    BOTTLES DRAWN AEROBIC AND ANAEROBIC Blood Culture adequate volume   Culture   Final    NO GROWTH 2 DAYS Performed at [facility], [place] [address] [place],  [address]    Report Status PENDING  Incomplete     Time coordinating discharge: Over 30 minutes  SIGNED:   Darliss Cheney, MD  [facility] 12/22/2018, 9:42 AM Pager [phone]  If 7PM-7AM, please contact night-coverage [email or web address] Password [email or web address]

## 2018-12-22 NOTE — Progress Notes (Deleted)
Cardiology Office Note   Date:  12/22/2018   ID:  Zoe, Clarke 1950/05/10, MRN [id number]  PCP:  Antony Contras, MD  Cardiologist:  Dr.[place]  No chief complaint on file.    History of Present Illness: Zoe Clarke is a 68 y.o. female who presents for ongoing assessment and management of diastolic CHF, hypertension, with other hx of CKD IV, and asthma.  Last echo 04/2018 EF of LVEF of 60%-65%, with Grade 1 diastolic dysfunction.   She has been seen last by Dr. Oval Linsey after admission for chronic diastolic CHF. She was diuresed and had increased lasix 40 mg BID. She has had issues with medical non-compliance. She unfortunately continues to smoke. There was counseling concerning BP control but was resistant to medication adjustments. She was continued on carvedilol, lasix and Imdur.    Past Medical History:  Diagnosis Date  . Allergy   . Arthritis   . Asthma    weazing at night uses inhalers  . Bronchitis    history of  . Chronic diastolic CHF (congestive heart failure) ([place]) 04/2018  . CKD (chronic kidney disease), stage III   . Diabetes mellitus without complication ([place])   . Gastric ulcer   . GERD (gastroesophageal reflux disease)   . History of hiatal hernia   . Hypertension   . Osteoporosis   . Sleep apnea 09/22/2018    Past Surgical History:  Procedure Laterality Date  . DILATION AND CURETTAGE OF UTERUS    . EYE SURGERY Left    removed cataract w/ lens  . TUBAL LIGATION       No current facility-administered medications for this visit.    No current outpatient medications on file.   Facility-Administered Medications Ordered in Other Visits  Medication Dose Route Frequency Provider Last Rate Last Dose  . albuterol (PROVENTIL) (2.5 MG/3ML) 0.083% nebulizer solution 3 mL  3 mL Inhalation Q6H PRN Pahwani, Rinka R, MD      . alum & mag hydroxide-simeth (MAALOX/MYLANTA) 200-200-20 MG/5ML suspension 15 mL  15 mL Oral Q6H PRN Pahwani, Ravi, MD      .  atorvastatin (LIPITOR) tablet 40 mg  40 mg Oral Daily Pahwani, Rinka R, MD   40 mg at 12/21/18 1026  . azithromycin (ZITHROMAX) 500 mg in sodium chloride 0.9 % 250 mL IVPB  500 mg Intravenous Q24H Pahwani, Michell Heinrich, MD   Stopped at 12/21/18 1930  . carvedilol (COREG) tablet 12.5 mg  12.5 mg Oral BID WC Pahwani, Rinka R, MD   12.5 mg at 12/21/18 1642  . cefTRIAXone (ROCEPHIN) 2 g in sodium chloride 0.9 % 100 mL IVPB  2 g Intravenous Q24H Pahwani, Rinka R, MD      . furosemide (LASIX) tablet 40 mg  40 mg Oral BID Pahwani, Rinka R, MD   40 mg at 12/21/18 1835  . hydrALAZINE (APRESOLINE) tablet 75 mg  75 mg Oral TID Mckinley Jewel, MD   75 mg at 12/21/18 2111  . insulin aspart (novoLOG) injection 0-15 Units  0-15 Units Subcutaneous TID WC Pahwani, Rinka R, MD      . insulin aspart (novoLOG) injection 0-5 Units  0-5 Units Subcutaneous QHS Pahwani, Rinka R, MD      . isosorbide mononitrate (IMDUR) 24 hr tablet 60 mg  60 mg Oral Daily Pahwani, Rinka R, MD   60 mg at 12/21/18 1027  . ondansetron (ZOFRAN) injection 4 mg  4 mg Intravenous Q6H PRN Blount, Lolita Cram, NP   4  mg at 12/21/18 0535  . oxyCODONE-acetaminophen (PERCOCET/ROXICET) 5-325 MG per tablet 0.5 tablet  0.5 tablet Oral Q6H PRN Pahwani, Rinka R, MD   0.5 tablet at 12/21/18 2110  . pantoprazole (PROTONIX) injection 40 mg  40 mg Intravenous Q24H Pahwani, Rinka R, MD   40 mg at 12/21/18 1643  . polyethylene glycol (MIRALAX / GLYCOLAX) packet 17 g  17 g Oral Daily PRN Pahwani, Rinka R, MD        Allergies:   Aspirin, Allopurinol, Amlodipine, Lisinopril, and Other    Social History:  The patient  reports that she has been smoking cigarettes. She has a 19.50 pack-year smoking history. She has never used smokeless tobacco. She reports previous drug use. Drug: Marijuana. She reports that she does not drink alcohol.   Family History:  The patient's family history includes Arthritis in her maternal grandmother and mother; Cancer in her sister; Emphysema  in her mother; Heart failure in her mother, sister, and sister; Hypertension in her maternal grandmother and mother; Lung disease in her sister; Stroke in her maternal grandfather.    ROS: All other systems are reviewed and negative. Unless otherwise mentioned in H&P    PHYSICAL EXAM: VS:  There were no vitals taken for this visit. , BMI There is no height or weight on file to calculate BMI. GEN: Well nourished, well developed, in no acute distress HEENT: normal Neck: no JVD, carotid bruits, or masses Cardiac: ***RRR; no murmurs, rubs, or gallops,no edema  Respiratory:  Clear to auscultation bilaterally, normal work of breathing GI: soft, nontender, nondistended, + BS MS: no deformity or atrophy Skin: warm and dry, no rash Neuro:  Strength and sensation are intact Psych: euthymic mood, full affect   EKG:  EKG {ACTION; IS/IS YHC:[id number]} ordered today. The ekg ordered today demonstrates ***   Recent Labs: 12/20/2018: ALT 11; B Natriuretic Peptide 1,171.5; Magnesium 2.0; TSH 1.359 12/21/2018: BUN 21; Creatinine, Ser 2.29; Hemoglobin 7.9; Platelets 198; Potassium 4.5; Sodium 142    Lipid Panel    Component Value Date/Time   CHOL 103 12/20/2018 1739   TRIG 68 12/20/2018 1739   HDL 35 (L) 12/20/2018 1739   CHOLHDL 2.9 12/20/2018 1739   VLDL 14 12/20/2018 1739   LDLCALC 54 12/20/2018 1739      Wt Readings from Last 3 Encounters:  12/22/18 250 lb 14.1 oz (113.8 kg)  11/27/18 257 lb 8 oz (116.8 kg)  11/11/18 257 lb 8 oz (116.8 kg)      Other studies Reviewed: Echo 04/28/18: IMPRESSIONS  1. The left ventricle has normal systolic function of 15-17%. The cavity size was normal. There is mildly increased left ventricular wall thickness. Echo evidence of impaired diastolic relaxation Elevated mean left atrial pressure. 2. The right ventricle has normal systolic function. The cavity was normal. There is no increase in right ventricular wall thickness. 3. Left atrial size was  mildly dilated. 4. The mitral valve is normal in structure. There is mild thickening. 5. The tricuspid valve is normal in structure. 6. The aortic valve is normal in structure. Aortic valve regurgitation is mild by color flow Doppler. 7. The inferior vena cava was dilated in size with >50% respiratory variability.   ASSESSMENT AND PLAN:  1.  ***   Current medicines are reviewed at length with the patient today.    Labs/ tests ordered today include: *** Phill Myron. [place] Pugh, ANP, AACC   12/22/2018 7:32 AM    [facility] [place]  [address] [phone] Fax [phone]

## 2018-12-22 NOTE — Consult Note (Signed)
   [facility] CM Inpatient Consult   12/22/2018  Zoe Clarke March 28, 1950 [id number]    Patient checked for31% extreme high risk score of unplanned readmission with 4 hospitalizations in the past 6 months, and for need of [facility] care management services.  Patient noted to have been outreached by [facility] care management coordinators in the past for EMMI HF calls and follow-up on poorly controlled HTN.  Review of medical record show that she received IV Rocephin and azithromycin in ED for presumed community-acquired pneumonia.She was admitted by [facility] and antibiotics were continued.  Patient has been dealing with chronic cholelithiasis and right upper quadrant pain for more than a year and that she has seen general surgeon Dr. Ninfa Linden as an outpatient, and have recommended laparoscopic cholecystectomy, however she tells me that her cardiologist Dr. Oval Linsey would not clear him for the surgery.   Transition of care CM note states that patient will discharge to home with home health services. She is active with [facility] for home health RN, PT, who will accept patient back at discharge.   Notedpatient is currently listed as an Engaged [facility] patient. She will be followed by [facility] in the community, with full case management services under her HTA plan.  [facility] care management services are not appropriate at this time.  Will sign off.   For questions, please contact:  Edwena Felty A. Aolani Piggott, BSN, RN-BC [facility] Liaison Cell: [phone]

## 2018-12-22 NOTE — Discharge Instructions (Signed)
Cholelithiasis  Cholelithiasis is also called "gallstones." It is a kind of gallbladder disease. The gallbladder is an organ that stores a liquid (bile) that helps you digest fat. Gallstones may not cause symptoms (may be silent gallstones) until they cause a blockage, and then they can cause pain (gallbladder attack). Follow these instructions at home:  Take over-the-counter and prescription medicines only as told by your doctor.  Stay at a healthy weight.  Eat healthy foods. This includes: ? Eating fewer fatty foods, like fried foods. ? Eating fewer refined carbs (refined carbohydrates). Refined carbs are breads and grains that are highly processed, like white bread and white rice. Instead, choose whole grains like whole-wheat bread and brown rice. ? Eating more fiber. Almonds, fresh fruit, and beans are healthy sources of fiber.  Keep all follow-up visits as told by your doctor. This is important. Contact a doctor if:  You have sudden pain in the upper right side of your belly (abdomen). Pain might spread to your right shoulder or your chest. This may be a sign of a gallbladder attack.  You feel sick to your stomach (are nauseous).  You throw up (vomit).  You have been diagnosed with gallstones that have no symptoms and you get: ? Belly pain. ? Discomfort, burning, or fullness in the upper part of your belly (indigestion). Get help right away if:  You have sudden pain in the upper right side of your belly, and it lasts for more than 2 hours.  You have belly pain that lasts for more than 5 hours.  You have a fever or chills.  You keep feeling sick to your stomach or you keep throwing up.  Your skin or the whites of your eyes turn yellow (jaundice).  You have dark-colored pee (urine).  You have light-colored poop (stool). Summary  Cholelithiasis is also called "gallstones."  The gallbladder is an organ that stores a liquid (bile) that helps you digest fat.  Silent  gallstones are gallstones that do not cause symptoms.  A gallbladder attack may cause sudden pain in the upper right side of your belly. Pain might spread to your right shoulder or your chest. If this happens, contact your doctor.  If you have sudden pain in the upper right side of your belly that lasts for more than 2 hours, get help right away. This information is not intended to replace advice given to you by your health care provider. Make sure you discuss any questions you have with your health care provider. Document Released: 08/22/2007 Document Revised: 02/15/2017 Document Reviewed: 11/20/2015 Elsevier Patient Education  2020 [employer].

## 2018-12-22 NOTE — Telephone Encounter (Signed)
PT [place] he is irate and I cannot get a word in. Informed him to call the hospital and have pt's nurse page Dr Oval Linsey if that is what he feels that he needs to do. Pt husband hung up the phone

## 2018-12-22 NOTE — Evaluation (Signed)
Physical Therapy Evaluation Patient Details Name: Zoe Clarke MRN: [id number] DOB: [birth year] Today's Date: 12/22/2018   History of Present Illness  68 yo female with onset of abd pain and cough with SOB, emergent, and was admitted.  Has cleared SBO, has cholelithiasis, cardiomegaly and susp PNA.  Pt is smoker, has new upper body tremor which does not impact mobility.  Has lived at home with husband, PLOF was to do bed to chair transfers.  PMHx:  CHF, DM, HTN, CKD 3, morbid obesity, uses tobacco  Clinical Impression  Pt was seen for mobility and strength testing and note she is able to walk with min guard but is requiring the set up to be right.  Elevated bed as pt reports she is going to use this at home.  Pt is likely close to PLOF but will recommend she continue with her home therapy to manage R hip pain and weakness.  Her plan is to go home in a taxi which will not work since she needs a higher surface to stand.  Will need medical transport to manage her transition home as can be managed.    Follow Up Recommendations Home health PT;Supervision for mobility/OOB;Supervision/Assistance - 24 hour    Equipment Recommendations  None recommended by PT(has walkers and power chair is applied for)    Recommendations for Other Services       Precautions / Restrictions Precautions Precautions: Fall Precaution Comments: prefers to stand with legs crossed bedside Restrictions Weight Bearing Restrictions: No Other Position/Activity Restrictions: painful calves and R hip, over a year      Mobility  Bed Mobility Overal bed mobility: Needs Assistance Bed Mobility: Supine to Sit;Sit to Supine     Supine to sit: Min assist Sit to supine: Mod assist   General bed mobility comments: min to lift trunk but then pt can perform, mod to lift legs back to bed due to R hip pain  Transfers Overall transfer level: Needs assistance Equipment used: Rolling walker (2 wheeled);1 person hand held  assist Transfers: Sit to/from Stand Sit to Stand: Min assist;From elevated surface         General transfer comment: pt insists she have bed elevated, has higher bed at home  Ambulation/Gait Ambulation/Gait assistance: Min guard Gait Distance (Feet): 7 Feet Assistive device: Rolling walker (2 wheeled);1 person hand held assist Gait Pattern/deviations: Step-to pattern;Wide base of support;Shuffle;Decreased stride length Gait velocity: reduced Gait velocity interpretation: <1.8 ft/sec, indicate of risk for recurrent falls General Gait Details: sidesteps on bed due to R hip pain, pt declines to get meds before tx  Stairs            Wheelchair Mobility    Modified Rankin (Stroke Patients Only)       Balance Overall balance assessment: Needs assistance Sitting-balance support: Feet supported;Single extremity supported Sitting balance-Leahy Scale: Good     Standing balance support: Bilateral upper extremity supported;During functional activity Standing balance-Leahy Scale: Fair Standing balance comment: uses RW to steady herself to step                             Pertinent Vitals/Pain Pain Assessment: Faces Faces Pain Scale: Hurts even more Pain Location: R hip and calves Pain Intervention(s): Limited activity within patient's tolerance;Monitored during session;Repositioned;Patient requesting pain meds-RN notified    Home Living Family/patient expects to be discharged to:: Private residence Living Arrangements: Spouse/significant other Available Help at Discharge: Family;Available 24 hours/day Type of  Home: House Home Access: Stairs to enter Entrance Stairs-Rails: Left Entrance Stairs-Number of Steps: 3 Home Layout: One level Home Equipment: Bedside commode;Walker - 2 wheels;Cane - single point;Walker - 4 wheels;Grab bars - tub/shower;Shower seat Additional Comments: pt reports she has 2 BSC - one over toilet and one by the bed.  Pt doesnt like her  shower chair - feels it is too wobbly.  Pt has 2 wheeled walker but uses her husbands 4 wheeled walker as seh likes it better.  Pts PCP has ordered for her to get power chair for going to MD appts etc.  she has seen someone about specs.    Prior Function Level of Independence: Needs assistance   Gait / Transfers Assistance Needed: walking with Rollator, short trips bed to chair per pt  ADL's / Homemaking Assistance Needed: husband helps with dressing and bathing  Comments: pt is worried about going home     Hand Dominance   Dominant Hand: Left    Extremity/Trunk Assessment   Upper Extremity Assessment Upper Extremity Assessment: Generalized weakness    Lower Extremity Assessment Lower Extremity Assessment: Generalized weakness    Cervical / Trunk Assessment Cervical / Trunk Assessment: Kyphotic  Communication   Communication: No difficulties  Cognition Arousal/Alertness: Awake/alert Behavior During Therapy: WFL for tasks assessed/performed Overall Cognitive Status: Within Functional Limits for tasks assessed                                        General Comments General comments (skin integrity, edema, etc.): pt is sitting side of bed and pulls herself up from recline independently, stands wiht higher surfaces which she reports are like home.  Pt is likely at [facility]    Exercises     Assessment/Plan    PT Assessment Patient needs continued PT services  PT Problem List Decreased strength;Decreased range of motion;Decreased activity tolerance;Decreased balance;Decreased mobility;Decreased coordination;Decreased knowledge of use of DME;Decreased safety awareness;Cardiopulmonary status limiting activity;Obesity;Decreased skin integrity;Pain       PT Treatment Interventions DME instruction;Gait training;Stair training;Functional mobility training;Therapeutic activities;Therapeutic exercise;Balance training;Neuromuscular re-education;Patient/family education     PT Goals (Current goals can be found in the Care Plan section)  Acute Rehab PT Goals Patient Stated Goal: to get stronger PT Goal Formulation: With patient Time For Goal Achievement: 01/05/19 Potential to Achieve Goals: Good    Frequency Min 4X/week   Barriers to discharge Inaccessible home environment;Decreased caregiver support home wiht husband who is able to assist her to transfer per pt    Co-evaluation               AM-PAC PT "6 Clicks" Mobility  Outcome Measure Help needed turning from your back to your side while in a flat bed without using bedrails?: None Help needed moving from lying on your back to sitting on the side of a flat bed without using bedrails?: A Little Help needed moving to and from a bed to a chair (including a wheelchair)?: A Little Help needed standing up from a chair using your arms (e.g., wheelchair or bedside chair)?: A Little Help needed to walk in hospital room?: A Little Help needed climbing 3-5 steps with a railing? : A Lot 6 Click Score: 18    End of Session Equipment Utilized During Treatment: Gait belt Activity Tolerance: Patient limited by fatigue Patient left: in bed;with call bell/phone within reach;with bed alarm set Nurse Communication: Mobility status(purwick  replacement needed) PT Visit Diagnosis: Unsteadiness on feet (R26.81);Muscle weakness (generalized) (M62.81);Pain Pain - Right/Left: (both) Pain - part of body: Hip;Leg    Time: 9672-2773 PT Time Calculation (min) (ACUTE ONLY): 36 min   Charges:   PT Evaluation $PT Eval Moderate Complexity: 1 Mod PT Treatments $Gait Training: 8-22 mins       Ramond Dial 12/22/2018, 11:19 AM   Mee Hives, PT MS Acute Rehab Dept. Number: [place] and [place]

## 2018-12-22 NOTE — TOC Transition Note (Signed)
Transition of Care [facility]) - CM/SW Discharge Note   Patient Details  Name: Zoe Clarke MRN: [id number] Date of Birth: 09/05/1950  [facility] CM/SW Contact:  Maryclare Labrador, RN Phone Number: 12/22/2018, 11:26 AM   Clinical Narrative:   PTA independent from home with husband.  Pt has all needed equipment in the home - pt is in the process with PCP to obtain motorized wheelchair. Pt informed CM she is active with [facility] for HHRN, PT.  Agency aware and will accept pt back at discharge.  Pt uses [facility] for transportation to appts.  Pt requesting transportation assistance for discharge - CSW will provide taxi voucher.  NO other CM needs - CM signing off    Final next level of care: [place] Barriers to Discharge: Continued Medical Work up   Patient Goals and CMS Choice Patient states their goals for this hospitalization and ongoing recovery are:: Pt states she is eager to get home and feel like herself again CMS Medicare.gov Compare Post Acute Care list provided to:: Patient Choice offered to / list presented to : Patient  Discharge Placement                       Discharge Plan and Services                          HH Arranged: RN, PT [facility] Agency: [place] ([facility]) Date [place]: 12/22/18 Time [place]: 1122 Representative spoke with at [place]: [place] ([place]) Interventions     Readmission Risk Interventions Readmission Risk Prevention Plan 12/22/2018 11/10/2018 09/30/2018  Transportation Screening Complete Complete -  [place]RI or [place] - Complete Complete  Social Work Consult for [place] Planning/Counseling - Complete -  Palliative Care Screening - Not Applicable Not Applicable  Medication Review Press photographer) Complete Complete -  PCP or Specialist appointment within 3-5 days of discharge Complete - -  [place] or Home Care Consult Complete - -   Palliative Care Screening Not Applicable - -  [place] Not Applicable - -  Some recent data might be hidden

## 2018-12-23 ENCOUNTER — Ambulatory Visit: Payer: PPO | Admitting: Adult Health

## 2018-12-24 DIAGNOSIS — G4730 Sleep apnea, unspecified: Secondary | ICD-10-CM | POA: Diagnosis not present

## 2018-12-24 DIAGNOSIS — N184 Chronic kidney disease, stage 4 (severe): Secondary | ICD-10-CM | POA: Diagnosis not present

## 2018-12-24 DIAGNOSIS — E1165 Type 2 diabetes mellitus with hyperglycemia: Secondary | ICD-10-CM | POA: Diagnosis not present

## 2018-12-24 DIAGNOSIS — M1A30X0 Chronic gout due to renal impairment, unspecified site, without tophus (tophi): Secondary | ICD-10-CM | POA: Diagnosis not present

## 2018-12-24 DIAGNOSIS — E785 Hyperlipidemia, unspecified: Secondary | ICD-10-CM | POA: Diagnosis not present

## 2018-12-24 DIAGNOSIS — J45909 Unspecified asthma, uncomplicated: Secondary | ICD-10-CM | POA: Diagnosis not present

## 2018-12-24 DIAGNOSIS — M1990 Unspecified osteoarthritis, unspecified site: Secondary | ICD-10-CM | POA: Diagnosis not present

## 2018-12-24 DIAGNOSIS — E1140 Type 2 diabetes mellitus with diabetic neuropathy, unspecified: Secondary | ICD-10-CM | POA: Diagnosis not present

## 2018-12-24 DIAGNOSIS — I5032 Chronic diastolic (congestive) heart failure: Secondary | ICD-10-CM | POA: Diagnosis not present

## 2018-12-24 DIAGNOSIS — D631 Anemia in chronic kidney disease: Secondary | ICD-10-CM | POA: Diagnosis not present

## 2018-12-24 DIAGNOSIS — K219 Gastro-esophageal reflux disease without esophagitis: Secondary | ICD-10-CM | POA: Diagnosis not present

## 2018-12-24 DIAGNOSIS — I130 Hypertensive heart and chronic kidney disease with heart failure and stage 1 through stage 4 chronic kidney disease, or unspecified chronic kidney disease: Secondary | ICD-10-CM | POA: Diagnosis not present

## 2018-12-24 DIAGNOSIS — E1122 Type 2 diabetes mellitus with diabetic chronic kidney disease: Secondary | ICD-10-CM | POA: Diagnosis not present

## 2018-12-25 LAB — CULTURE, BLOOD (ROUTINE X 2)
Culture: NO GROWTH
Culture: NO GROWTH
Special Requests: ADEQUATE
Special Requests: ADEQUATE

## 2018-12-30 DIAGNOSIS — Z96659 Presence of unspecified artificial knee joint: Secondary | ICD-10-CM | POA: Diagnosis not present

## 2018-12-30 DIAGNOSIS — Z9660 Presence of unspecified orthopedic joint implant: Secondary | ICD-10-CM | POA: Diagnosis not present

## 2018-12-30 DIAGNOSIS — I5021 Acute systolic (congestive) heart failure: Secondary | ICD-10-CM | POA: Diagnosis not present

## 2018-12-30 DIAGNOSIS — M25561 Pain in right knee: Secondary | ICD-10-CM | POA: Diagnosis not present

## 2018-12-31 ENCOUNTER — Ambulatory Visit: Payer: PPO | Admitting: Gastroenterology

## 2019-01-01 ENCOUNTER — Encounter: Payer: Self-pay | Admitting: Gastroenterology

## 2019-01-01 DIAGNOSIS — K219 Gastro-esophageal reflux disease without esophagitis: Secondary | ICD-10-CM | POA: Diagnosis not present

## 2019-01-01 DIAGNOSIS — N184 Chronic kidney disease, stage 4 (severe): Secondary | ICD-10-CM | POA: Diagnosis not present

## 2019-01-01 DIAGNOSIS — E7800 Pure hypercholesterolemia, unspecified: Secondary | ICD-10-CM | POA: Diagnosis not present

## 2019-01-01 DIAGNOSIS — M1611 Unilateral primary osteoarthritis, right hip: Secondary | ICD-10-CM | POA: Diagnosis not present

## 2019-01-01 DIAGNOSIS — R1011 Right upper quadrant pain: Secondary | ICD-10-CM | POA: Diagnosis not present

## 2019-01-01 DIAGNOSIS — K5900 Constipation, unspecified: Secondary | ICD-10-CM | POA: Diagnosis not present

## 2019-01-01 DIAGNOSIS — I5032 Chronic diastolic (congestive) heart failure: Secondary | ICD-10-CM | POA: Diagnosis not present

## 2019-01-01 DIAGNOSIS — K8020 Calculus of gallbladder without cholecystitis without obstruction: Secondary | ICD-10-CM | POA: Diagnosis not present

## 2019-01-01 DIAGNOSIS — I10 Essential (primary) hypertension: Secondary | ICD-10-CM | POA: Diagnosis not present

## 2019-01-01 DIAGNOSIS — R251 Tremor, unspecified: Secondary | ICD-10-CM | POA: Diagnosis not present

## 2019-01-01 DIAGNOSIS — E1169 Type 2 diabetes mellitus with other specified complication: Secondary | ICD-10-CM | POA: Diagnosis not present

## 2019-01-08 DIAGNOSIS — M1990 Unspecified osteoarthritis, unspecified site: Secondary | ICD-10-CM | POA: Diagnosis not present

## 2019-01-08 DIAGNOSIS — N184 Chronic kidney disease, stage 4 (severe): Secondary | ICD-10-CM | POA: Diagnosis not present

## 2019-01-08 DIAGNOSIS — M1A30X0 Chronic gout due to renal impairment, unspecified site, without tophus (tophi): Secondary | ICD-10-CM | POA: Diagnosis not present

## 2019-01-08 DIAGNOSIS — I130 Hypertensive heart and chronic kidney disease with heart failure and stage 1 through stage 4 chronic kidney disease, or unspecified chronic kidney disease: Secondary | ICD-10-CM | POA: Diagnosis not present

## 2019-01-08 DIAGNOSIS — J45909 Unspecified asthma, uncomplicated: Secondary | ICD-10-CM | POA: Diagnosis not present

## 2019-01-08 DIAGNOSIS — I5032 Chronic diastolic (congestive) heart failure: Secondary | ICD-10-CM | POA: Diagnosis not present

## 2019-01-08 DIAGNOSIS — E1165 Type 2 diabetes mellitus with hyperglycemia: Secondary | ICD-10-CM | POA: Diagnosis not present

## 2019-01-08 DIAGNOSIS — E1140 Type 2 diabetes mellitus with diabetic neuropathy, unspecified: Secondary | ICD-10-CM | POA: Diagnosis not present

## 2019-01-08 DIAGNOSIS — E785 Hyperlipidemia, unspecified: Secondary | ICD-10-CM | POA: Diagnosis not present

## 2019-01-08 DIAGNOSIS — E1122 Type 2 diabetes mellitus with diabetic chronic kidney disease: Secondary | ICD-10-CM | POA: Diagnosis not present

## 2019-01-08 DIAGNOSIS — G4730 Sleep apnea, unspecified: Secondary | ICD-10-CM | POA: Diagnosis not present

## 2019-01-08 DIAGNOSIS — D631 Anemia in chronic kidney disease: Secondary | ICD-10-CM | POA: Diagnosis not present

## 2019-01-08 DIAGNOSIS — K219 Gastro-esophageal reflux disease without esophagitis: Secondary | ICD-10-CM | POA: Diagnosis not present

## 2019-01-12 NOTE — Progress Notes (Addendum)
Cardiology Office Note   Date:  01/13/2019   ID:  Zoe, Clarke 29-May-1950, MRN [id number]  PCP:  Antony Contras, MD  Cardiologist:  Dr.Madera Acres  CC: Pre-Operative evaluation    History of Present Illness: Zoe Clarke is a 68 y.o. female who presents for ongoing assessment and management of chronic diastolic CHF, hypertension, with other history of CKD IV and asthma.  She was recently discharged from [facility] after admission for right upper quadrant pain, and was diagnosed with cholelithiasis, without cholecystitis. She was also found to have RLL opacity with atelectasis verses pneumonia. She was recommended for a laparoscopic chole by Dr. Ninfa [place].   She was not cleared for surgery in August of 2020 due to uncontrolled hypertension. She was reluctant to increase antihypertensive medication at that time. She was advised on sodium restriction and weight loss.  She unfortunately continues to smoke.   She has a history of multiple admissions for decompensated CHF due to hypertension, due to medical and dietary non-compliance issues. She has CKD with most recent Creatinine of  2.64 on discharge.   Zoe Clarke comes today hypertensive, blood pressure 192/90.  She has not taken her medications yet this morning.  She states she was not feeling well.  She states at home it is normally in the 409B systolic.  She is complaining of chronic abdominal pain.  She is also just finished smoking a cigarette.  She states that she is trying to quit.  Past Medical History:  Diagnosis Date  . Allergy   . Arthritis   . Asthma    weazing at night uses inhalers  . Bronchitis    history of  . Chronic diastolic CHF (congestive heart failure) ([place]) 04/2018  . CKD (chronic kidney disease), stage III   . Diabetes mellitus without complication ([place])   . Gastric ulcer   . GERD (gastroesophageal reflux disease)   . History of hiatal hernia   . Hypertension   . Osteoporosis   . Sleep apnea 09/22/2018     Past Surgical History:  Procedure Laterality Date  . DILATION AND CURETTAGE OF UTERUS    . EYE SURGERY Left    removed cataract w/ lens  . TUBAL LIGATION       Current Outpatient Medications  Medication Sig Dispense Refill  . albuterol (PROVENTIL HFA;VENTOLIN HFA) 108 (90 Base) MCG/ACT inhaler Inhale 2 puffs into the lungs every 6 (six) hours as needed for wheezing or shortness of breath. 1 Inhaler 0  . atorvastatin (LIPITOR) 40 MG tablet Take 40 mg by mouth daily.    . carvedilol (COREG) 12.5 MG tablet Take 1 tablet (12.5 mg total) by mouth 2 (two) times daily with a meal. 60 tablet 2  . cyclobenzaprine (FLEXERIL) 5 MG tablet Take 5-10 mg by mouth at bedtime.     . ergocalciferol (VITAMIN D2) 1.25 MG (50000 UT) capsule Take 50,000 Units by mouth every Friday.     . fexofenadine (ALLEGRA) 180 MG tablet Take 180 mg by mouth daily as needed for allergies.     . furosemide (LASIX) 40 MG tablet Take 1 tablet (40 mg total) by mouth 2 (two) times daily. 60 tablet 0  . gabapentin (NEURONTIN) 300 MG capsule Take 900 mg by mouth 3 (three) times daily.     Marland Kitchen glipiZIDE (GLUCOTROL XL) 10 MG 24 hr tablet Take 10 mg by mouth daily with breakfast.     . hydrALAZINE (APRESOLINE) 25 MG tablet Take 3 tablets (75 mg total) by  mouth 3 (three) times daily. 270 tablet 2  . hydrocortisone cream 1 % Apply topically 2 (two) times daily. Areas of itch 30 g 0  . isosorbide mononitrate (IMDUR) 60 MG 24 hr tablet Take 1 tablet (60 mg total) by mouth daily. 30 tablet 2  . linaclotide (LINZESS) 145 MCG CAPS capsule Take 145 mcg by mouth daily as needed (constipation).     . Multiple Vitamin (MULTIVITAMIN WITH MINERALS) TABS tablet Take 1 tablet by mouth daily.    Marland Kitchen oxyCODONE-acetaminophen (PERCOCET/ROXICET) 5-325 MG tablet Take 0.5 tablets by mouth every 6 (six) hours as needed for moderate pain or severe pain.     . pantoprazole (PROTONIX) 40 MG tablet Take 1 tablet (40 mg total) by mouth daily. 30 tablet 11  .  polyethylene glycol (MIRALAX / GLYCOLAX) packet Take 17 g by mouth daily as needed for mild constipation. 14 each 0  . triamcinolone cream (KENALOG) 0.1 % Apply 1 application topically daily as needed (itching on back).     . triamcinolone ointment (KENALOG) 0.1 % Apply 1 application topically 2 (two) times daily. Gel on inside of ear     No current facility-administered medications for this visit.     Allergies:   Aspirin, Allopurinol, Amlodipine, Lisinopril, and Other    Social History:  The patient  reports that she has been smoking cigarettes. She has a 19.50 pack-year smoking history. She has never used smokeless tobacco. She reports previous drug use. Drug: Marijuana. She reports that she does not drink alcohol.   Family History:  The patient's family history includes Arthritis in her maternal grandmother and mother; Cancer in her sister; Emphysema in her mother; Heart failure in her mother, sister, and sister; Hypertension in her maternal grandmother and mother; Lung disease in her sister; Stroke in her maternal grandfather.    ROS: All other systems are reviewed and negative. Unless otherwise mentioned in H&P    PHYSICAL EXAM: VS:  BP (!) 192/90   Pulse 82   Ht 5\' 8"  (1.727 m)   Wt 256 lb 9.6 oz (116.4 kg)   SpO2 98%   BMI 39.02 kg/m  , BMI Body mass index is 39.02 kg/m. GEN: Well nourished, well developed, in no acute distress, morbidly obese HEENT: normal Neck: no JVD, carotid bruits, or masses Cardiac: RRR, tachycardic; no murmurs, rubs, or gallops,no edema  Respiratory:  Clear to auscultation bilaterally, normal work of breathing, bilateral crackles in the bases. GI: soft, nontender, nondistended, + BS MS: no deformity or atrophy Skin: warm and dry, no rash Neuro:  Strength and sensation are intact Psych: euthymic mood, full affect   EKG: Normal sinus rhythm, with rare sinus arrhythmia, heart rate of 85 bpm.  QTc 490 ms.  Recent Labs: 12/20/2018: ALT 11; B  Natriuretic Peptide 1,171.5; Magnesium 2.0; TSH 1.359 12/22/2018: BUN 22; Creatinine, Ser 2.64; Hemoglobin 8.2; Platelets 208; Potassium 4.5; Sodium 141    Lipid Panel    Component Value Date/Time   CHOL 103 12/20/2018 1739   TRIG 68 12/20/2018 1739   HDL 35 (L) 12/20/2018 1739   CHOLHDL 2.9 12/20/2018 1739   VLDL 14 12/20/2018 1739   LDLCALC 54 12/20/2018 1739      Wt Readings from Last 3 Encounters:  01/13/19 256 lb 9.6 oz (116.4 kg)  12/22/18 250 lb 14.1 oz (113.8 kg)  11/27/18 257 lb 8 oz (116.8 kg)      Other studies Reviewed: Echo 04/28/18: IMPRESSIONS  1. The left ventricle has normal systolic  function of 60-65%. The cavity size was normal. There is mildly increased left ventricular wall thickness. Echo evidence of impaired diastolic relaxation Elevated mean left atrial pressure. 2. The right ventricle has normal systolic function. The cavity was normal. There is no increase in right ventricular wall thickness. 3. Left atrial size was mildly dilated. 4. The mitral valve is normal in structure. There is mild thickening. 5. The tricuspid valve is normal in structure. 6. The aortic valve is normal in structure. Aortic valve regurgitation is mild by color flow Doppler. 7. The inferior vena cava was dilated in size with >50% respiratory variability.   ASSESSMENT AND PLAN:  1. Pre-Operative Cardiac Evaluation: Mrs. Doyel blood pressure is not well controlled on this office visit.  She has not taken her medication this morning.  I cannot in good conscience allow her to proceed with surgery until I have documentation of a blood pressure that is controlled prior to her surgery.  I have reviewed her most recent blood pressure on hospitalization discharge and it was found to be 146/70 on December 22, 2018.  She is to call us this afternoon after she takes her medications on return to her home to give [place] a recording of her BP at that time.  She also has a home health nurses  coming out to see her on Friday October 29.  I will have the home health nurse also call us with documentation of her blood pressure.  If I had to blood pressures consecutively within normal limits then I will be able to clear her.  But I am not able to clear her today based upon her blood pressure readings off of her medications.    October 30,2020 Addendum:  HHN has check her BP at home on medication regimen. BP is much better controlled on 150/78.  She is okay to proceed with surgery.   Current medicines are reviewed at length with the patient today.    Labs/ tests ordered today include: None Phill Myron. West Pugh, ANP, AACC   01/13/2019 8:39 AM    [facility] [address] Office [phone] Fax [phone]  Notice: This dictation was prepared with Dragon dictation along with smaller phrase technology. Any transcriptional errors that result from this process are unintentional and may not be corrected upon review.

## 2019-01-13 ENCOUNTER — Ambulatory Visit: Payer: PPO | Admitting: Adult Health

## 2019-01-13 ENCOUNTER — Encounter: Payer: Self-pay | Admitting: Adult Health

## 2019-01-13 ENCOUNTER — Other Ambulatory Visit: Payer: Self-pay

## 2019-01-13 VITALS — BP 192/90 | HR 82 | Ht 68.0 in | Wt 256.6 lb

## 2019-01-13 DIAGNOSIS — Z01818 Encounter for other preprocedural examination: Secondary | ICD-10-CM

## 2019-01-13 DIAGNOSIS — I10 Essential (primary) hypertension: Secondary | ICD-10-CM | POA: Diagnosis not present

## 2019-01-13 DIAGNOSIS — N183 Chronic kidney disease, stage 3 unspecified: Secondary | ICD-10-CM | POA: Diagnosis not present

## 2019-01-13 DIAGNOSIS — K8020 Calculus of gallbladder without cholecystitis without obstruction: Secondary | ICD-10-CM | POA: Diagnosis not present

## 2019-01-13 DIAGNOSIS — Z9181 History of falling: Secondary | ICD-10-CM | POA: Diagnosis not present

## 2019-01-13 DIAGNOSIS — G4730 Sleep apnea, unspecified: Secondary | ICD-10-CM | POA: Diagnosis not present

## 2019-01-13 DIAGNOSIS — E785 Hyperlipidemia, unspecified: Secondary | ICD-10-CM | POA: Diagnosis not present

## 2019-01-13 DIAGNOSIS — F1210 Cannabis abuse, uncomplicated: Secondary | ICD-10-CM | POA: Diagnosis not present

## 2019-01-13 DIAGNOSIS — J45909 Unspecified asthma, uncomplicated: Secondary | ICD-10-CM | POA: Diagnosis not present

## 2019-01-13 DIAGNOSIS — I5032 Chronic diastolic (congestive) heart failure: Secondary | ICD-10-CM | POA: Diagnosis not present

## 2019-01-13 DIAGNOSIS — D631 Anemia in chronic kidney disease: Secondary | ICD-10-CM | POA: Diagnosis not present

## 2019-01-13 DIAGNOSIS — E1140 Type 2 diabetes mellitus with diabetic neuropathy, unspecified: Secondary | ICD-10-CM | POA: Diagnosis not present

## 2019-01-13 DIAGNOSIS — Z7984 Long term (current) use of oral hypoglycemic drugs: Secondary | ICD-10-CM | POA: Diagnosis not present

## 2019-01-13 DIAGNOSIS — E1122 Type 2 diabetes mellitus with diabetic chronic kidney disease: Secondary | ICD-10-CM | POA: Diagnosis not present

## 2019-01-13 NOTE — Patient Instructions (Signed)
Medication Instructions:  Continue current medications  *If you need a refill on your cardiac medications before your next appointment, please call your pharmacy*  Lab Work: None Ordered  Testing/Procedures: None Ordered  Follow-Up: At [facility], you and your health needs are our priority.  As part of our continuing mission to provide you with exceptional heart care, we have created designated Provider Care Teams.  These Care Teams include your primary Cardiologist (physician) and Advanced Practice Providers (APPs -  Physician Assistants and Nurse Practitioners) who all work together to provide you with the care you need, when you need it.  Your next appointment:   3 Months  The format for your next appointment:   In Person  Provider:   Skeet Latch, MD  Other Instructions Please give [place] a call [phone] with blood pressure reading

## 2019-01-16 DIAGNOSIS — G4730 Sleep apnea, unspecified: Secondary | ICD-10-CM | POA: Diagnosis not present

## 2019-01-16 DIAGNOSIS — J45909 Unspecified asthma, uncomplicated: Secondary | ICD-10-CM | POA: Diagnosis not present

## 2019-01-16 DIAGNOSIS — D631 Anemia in chronic kidney disease: Secondary | ICD-10-CM | POA: Diagnosis not present

## 2019-01-16 DIAGNOSIS — I5032 Chronic diastolic (congestive) heart failure: Secondary | ICD-10-CM | POA: Diagnosis not present

## 2019-01-16 DIAGNOSIS — E785 Hyperlipidemia, unspecified: Secondary | ICD-10-CM | POA: Diagnosis not present

## 2019-01-16 DIAGNOSIS — K8020 Calculus of gallbladder without cholecystitis without obstruction: Secondary | ICD-10-CM | POA: Diagnosis not present

## 2019-01-16 DIAGNOSIS — M1990 Unspecified osteoarthritis, unspecified site: Secondary | ICD-10-CM | POA: Diagnosis not present

## 2019-01-16 DIAGNOSIS — E1140 Type 2 diabetes mellitus with diabetic neuropathy, unspecified: Secondary | ICD-10-CM | POA: Diagnosis not present

## 2019-01-16 DIAGNOSIS — N183 Chronic kidney disease, stage 3 unspecified: Secondary | ICD-10-CM | POA: Diagnosis not present

## 2019-01-16 DIAGNOSIS — M1A30X0 Chronic gout due to renal impairment, unspecified site, without tophus (tophi): Secondary | ICD-10-CM | POA: Diagnosis not present

## 2019-01-16 DIAGNOSIS — I130 Hypertensive heart and chronic kidney disease with heart failure and stage 1 through stage 4 chronic kidney disease, or unspecified chronic kidney disease: Secondary | ICD-10-CM | POA: Diagnosis not present

## 2019-01-16 DIAGNOSIS — K219 Gastro-esophageal reflux disease without esophagitis: Secondary | ICD-10-CM | POA: Diagnosis not present

## 2019-01-16 DIAGNOSIS — E1122 Type 2 diabetes mellitus with diabetic chronic kidney disease: Secondary | ICD-10-CM | POA: Diagnosis not present

## 2019-01-19 DIAGNOSIS — K219 Gastro-esophageal reflux disease without esophagitis: Secondary | ICD-10-CM | POA: Diagnosis not present

## 2019-01-19 DIAGNOSIS — J45909 Unspecified asthma, uncomplicated: Secondary | ICD-10-CM | POA: Diagnosis not present

## 2019-01-19 DIAGNOSIS — M1A30X0 Chronic gout due to renal impairment, unspecified site, without tophus (tophi): Secondary | ICD-10-CM | POA: Diagnosis not present

## 2019-01-19 DIAGNOSIS — D631 Anemia in chronic kidney disease: Secondary | ICD-10-CM | POA: Diagnosis not present

## 2019-01-19 DIAGNOSIS — G4730 Sleep apnea, unspecified: Secondary | ICD-10-CM | POA: Diagnosis not present

## 2019-01-19 DIAGNOSIS — E785 Hyperlipidemia, unspecified: Secondary | ICD-10-CM | POA: Diagnosis not present

## 2019-01-19 DIAGNOSIS — E1140 Type 2 diabetes mellitus with diabetic neuropathy, unspecified: Secondary | ICD-10-CM | POA: Diagnosis not present

## 2019-01-19 DIAGNOSIS — I5032 Chronic diastolic (congestive) heart failure: Secondary | ICD-10-CM | POA: Diagnosis not present

## 2019-01-19 DIAGNOSIS — N183 Chronic kidney disease, stage 3 unspecified: Secondary | ICD-10-CM | POA: Diagnosis not present

## 2019-01-19 DIAGNOSIS — M1990 Unspecified osteoarthritis, unspecified site: Secondary | ICD-10-CM | POA: Diagnosis not present

## 2019-01-19 DIAGNOSIS — E1122 Type 2 diabetes mellitus with diabetic chronic kidney disease: Secondary | ICD-10-CM | POA: Diagnosis not present

## 2019-01-19 DIAGNOSIS — I130 Hypertensive heart and chronic kidney disease with heart failure and stage 1 through stage 4 chronic kidney disease, or unspecified chronic kidney disease: Secondary | ICD-10-CM | POA: Diagnosis not present

## 2019-01-19 DIAGNOSIS — K8020 Calculus of gallbladder without cholecystitis without obstruction: Secondary | ICD-10-CM | POA: Diagnosis not present

## 2019-01-21 DIAGNOSIS — N184 Chronic kidney disease, stage 4 (severe): Secondary | ICD-10-CM | POA: Diagnosis not present

## 2019-01-21 DIAGNOSIS — D649 Anemia, unspecified: Secondary | ICD-10-CM | POA: Diagnosis not present

## 2019-01-23 ENCOUNTER — Telehealth: Payer: Self-pay | Admitting: Cardiovascular Disease

## 2019-01-23 NOTE — Telephone Encounter (Signed)
Donita from [facility] is calling to let Dr. Oval Linsey know that the patients BP this morning was 160/100. States the patient has just woke up and not taken her medication yet. There are no symptoms to report.

## 2019-01-23 NOTE — Telephone Encounter (Signed)
Ask her to track BP and bring to follow up.

## 2019-01-23 NOTE — Telephone Encounter (Signed)
Will route to MD to make aware. 

## 2019-01-26 NOTE — Telephone Encounter (Signed)
   11/4 blood pressure 138/86 and had her medication. On 11/6 she had not had her medication. HHN is going to be seeing her this week a couple of times and will keep an eye on her blood pressure per Donita patients machine is not reading close to her machine and would likely not be able to afford a new one. Have sent message to social worker to see if would be able to get one sent to patient. Donita RN will call back updated readings.

## 2019-01-27 ENCOUNTER — Telehealth: Payer: Self-pay | Admitting: Licensed Clinical Social Worker

## 2019-01-27 NOTE — Telephone Encounter (Signed)
CSW referred to assist patient with obtaining a BP cuff. CSW contacted patient to inform cuff will be delivered to home. Patient grateful for support and assistance. CSW available as needed. Jackie Cierrah Dace, LCSW, CCSW-MCS [phone]  

## 2019-01-30 DIAGNOSIS — I5021 Acute systolic (congestive) heart failure: Secondary | ICD-10-CM | POA: Diagnosis not present

## 2019-01-30 DIAGNOSIS — Z9660 Presence of unspecified orthopedic joint implant: Secondary | ICD-10-CM | POA: Diagnosis not present

## 2019-01-30 DIAGNOSIS — M25561 Pain in right knee: Secondary | ICD-10-CM | POA: Diagnosis not present

## 2019-01-30 DIAGNOSIS — Z96659 Presence of unspecified artificial knee joint: Secondary | ICD-10-CM | POA: Diagnosis not present

## 2019-02-05 ENCOUNTER — Ambulatory Visit: Payer: PPO | Attending: Family Medicine | Admitting: Physical Therapy

## 2019-02-05 ENCOUNTER — Other Ambulatory Visit: Payer: Self-pay

## 2019-02-05 DIAGNOSIS — M6281 Muscle weakness (generalized): Secondary | ICD-10-CM

## 2019-02-05 DIAGNOSIS — G8929 Other chronic pain: Secondary | ICD-10-CM | POA: Diagnosis not present

## 2019-02-05 DIAGNOSIS — R2689 Other abnormalities of gait and mobility: Secondary | ICD-10-CM | POA: Insufficient documentation

## 2019-02-05 DIAGNOSIS — M545 Low back pain: Secondary | ICD-10-CM | POA: Diagnosis not present

## 2019-02-06 ENCOUNTER — Encounter: Payer: Self-pay | Admitting: Physical Therapy

## 2019-02-06 ENCOUNTER — Ambulatory Visit: Payer: PPO | Admitting: Gastroenterology

## 2019-02-06 ENCOUNTER — Encounter: Payer: Self-pay | Admitting: Gastroenterology

## 2019-02-06 VITALS — BP 160/88 | HR 88 | Temp 97.5°F | Ht 68.0 in | Wt 247.0 lb

## 2019-02-06 DIAGNOSIS — R1032 Left lower quadrant pain: Secondary | ICD-10-CM | POA: Diagnosis not present

## 2019-02-06 NOTE — Therapy (Signed)
[facility] [address] [place], [place], [address] Phone: [phone]   Fax:  [phone]  Physical Therapy Evaluation  Patient Details  Name: Zoe Clarke MRN: [id number] Date of Birth: 03/03/1951 Referring Provider (PT): Antony Contras, MD   Encounter Date: 02/05/2019  PT End of Session - 02/06/19 1935    Visit Number  1    Authorization Type  [employer]    Authorization Time Period  02-05-19 - 03-07-19    PT Start Time  0930    PT Stop Time  1035    PT Time Calculation (min)  65 min    Activity Tolerance  Patient tolerated treatment well    Behavior During Therapy  [facility] for tasks assessed/performed       Past Medical History:  Diagnosis Date  . Allergy   . Arthritis   . Asthma    weazing at night uses inhalers  . Bronchitis    history of  . Chronic diastolic CHF (congestive heart failure) ([place]) 04/2018  . CKD (chronic kidney disease), stage III   . Diabetes mellitus without complication ([place])   . Gastric ulcer   . GERD (gastroesophageal reflux disease)   . History of hiatal hernia   . Hypertension   . Osteoporosis   . Sleep apnea 09/22/2018    Past Surgical History:  Procedure Laterality Date  . DILATION AND CURETTAGE OF UTERUS    . EYE SURGERY Left    removed cataract w/ lens  . TUBAL LIGATION      There were no vitals filed for this visit.   Subjective Assessment - 02/06/19 1927    Subjective  Pt presents for power wheelchair evaluation -Josh Cadle, ATP with [employer] present for eval; pt using RW for assistance with ambulation    Patient is accompained by:  --   husband waiting in lobby   Pertinent History  CKD stage 3:  Type 2 DM with peripheral neuropathy; CHF; chronic low back pain    Patient Stated Goals  obtain power wheelchair    Currently in Pain?  No/denies         [facility] PT Assessment - 02/06/19 0001      Assessment   Medical Diagnosis  CHF:  CKD stage 3:   DM type 2 with peripheral neuropathy    Referring Provider (PT)  Antony Contras, MD    Onset Date/Surgical Date  --   Feb. 2020   Hand Dominance  Left      Precautions   Precautions  Fall      Balance Screen   Has the patient fallen in the past 6 months  No    Has the patient had a decrease in activity level because of a fear of falling?   No    Is the patient reluctant to leave their home because of a fear of falling?   No                Objective measurements completed on examination: See above findings.                 LMN for power wheelchair to be completed - Medical sales representative wheelchair to be obtained from [facility] - 02/06/19 1937    Clinical Impression Statement  Pt evaluated for group 2 power wheelchair with Josh Cadle, ATP with [place]; pt using RW for assistance with ambulation but has  very unsteady gait    Personal Factors and Comorbidities  Comorbidity 2;Fitness    Examination-Activity Limitations  Locomotion Level;Stand;Squat;Transfers    Examination-Participation Restrictions  Laundry;Shop;Cleaning    Stability/Clinical Decision Making  Evolving/Moderate complexity    Clinical Decision Making  Moderate    Rehab Potential  Good    PT Frequency  One time visit    PT Treatment/Interventions  Other (comment)   wheelchair management   Recommended Other Services  wheelchair to be obtained from [place] and Agree with Plan of Care  Patient       Patient will benefit from skilled therapeutic intervention in order to improve the following deficits and impairments:  Abnormal gait, Pain, Decreased balance, Decreased strength  Visit Diagnosis: Muscle weakness (generalized) - Plan: PT plan of care cert/re-cert  Chronic bilateral low back pain without sciatica - Plan: PT plan of care cert/re-cert  Other abnormalities of gait and mobility - Plan: PT plan of care cert/re-cert     Problem  List Patient Active Problem List   Diagnosis Date Noted  . Atelectasis 12/22/2018  . CAP (community acquired pneumonia) 12/20/2018  . Obesity, Class III, BMI 40-49.9 (morbid obesity) ([place]) 12/20/2018  . Marijuana abuse 12/20/2018  . Dysuria 12/20/2018  . Chronic diastolic CHF (congestive heart failure) ([place]) 11/06/2018  . 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 Thyroid nodule 09/22/2018  . Anemia, chronic renal failure, stage 3 (moderate) 09/22/2018  . Sleep apnea 09/22/2018  . Precordial chest pain   . Cholelithiasis 07/30/2018  . Acute on chronic combined systolic (congestive) and diastolic (congestive) heart failure ([place]) 07/29/2018  . Dyspnea 07/29/2018  . Tobacco abuse counseling 04/27/2018  . Acute pyelonephritis 04/10/2018  . Hypertensive urgency 04/10/2018  . Leukocytosis 09/10/2017  . Anemia of chronic disease 09/10/2017  . Knee pain, bilateral 01/13/2015  . Bilateral hip pain 11/23/2014  . Diabetes mellitus ([place]) 10/25/2014  . Intertrigo 10/25/2014  . GERD (gastroesophageal reflux disease) 10/25/2014  . Diabetic neuropathy ([place]) 10/25/2014  . Chronic low back pain 10/25/2014  . Tobacco abuse 11/11/2013  . CKD (chronic kidney disease), stage III 09/02/2013  . Uncontrolled hypertension 09/02/2013  . Gout 12/05/2012    DildayJenness Corner, PT 02/06/2019, 7:50 PM  [facility] [address] [place], [place], [address] Phone: [phone]   Fax:  [phone]  Name: Zoe Clarke MRN: [id number] Date of Birth: [birth year]

## 2019-02-06 NOTE — Patient Instructions (Signed)
Follow a healthy weight loss program.  Follow up as needed.

## 2019-02-06 NOTE — Progress Notes (Signed)
HPI: This is a very pleasant 68 year old woman whom I last saw the time of a colonoscopy, see those results below.  She is here today because of left lower quadrant pains.  She has no troubles with her bowels, no nausea or vomiting.  No overt GI bleeding.  She grabbed her left-sided low abdomen pannus and said that this hurts and she thinks it is a cancer.  Her husband agrees with her he is concerned that she has a cancer.  She tells me this "swelling" has been up and down 4 months.  Chief complaint is left lower quadrant abdominal pains Blood work October 2020 show hemoglobin in the low eights, normal platelets, normal MCV Creatinine anywhere from 2-3, normal LFTs   Old Data Reviewed: Colonoscopy August 2017 for routine risk colon cancer screening, found 3 polyps as well as diverticulosis.  One of the polyps was 11 mm.  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   Since January 2020 she has had 4 abdominal CAT scans, 3 abdominal ultrasounds, HIDA scan; all done for a variety abdominal complaints, generally right-sided abdominal pains.  Pertinent findings on all of these numerous abdominal imaging tests are she has cholelithiasis and a low gallbladder ejection fraction of 18%.    Review of systems: Pertinent positive and negative review of systems were noted in the above HPI section. All other review negative.   Past Medical History:  Diagnosis Date  . Allergy   . Arthritis   . Asthma    weazing at night uses inhalers  . Bronchitis    history of  . Chronic diastolic CHF (congestive heart failure) ([place]) 04/2018  . CKD (chronic kidney disease), stage III   . Diabetes mellitus without complication ([place])   . Gastric ulcer   . GERD (gastroesophageal reflux disease)   . History of hiatal hernia   . Hypertension   . Osteoporosis   . Sleep apnea 09/22/2018    Past Surgical History:  Procedure Laterality Date  . DILATION AND  CURETTAGE OF UTERUS    . EYE SURGERY Left    removed cataract w/ lens  . TUBAL LIGATION      Current Outpatient Medications  Medication Sig Dispense Refill  . albuterol (PROVENTIL HFA;VENTOLIN HFA) 108 (90 Base) MCG/ACT inhaler Inhale 2 puffs into the lungs every 6 (six) hours as needed for wheezing or shortness of breath. 1 Inhaler 0  . atorvastatin (LIPITOR) 40 MG tablet Take 40 mg by mouth daily.    . carvedilol (COREG) 12.5 MG tablet Take 1 tablet (12.5 mg total) by mouth 2 (two) times daily with a meal. 60 tablet 2  . cyclobenzaprine (FLEXERIL) 5 MG tablet Take 5-10 mg by mouth at bedtime.     . ergocalciferol (VITAMIN D2) 1.25 MG (50000 UT) capsule Take 50,000 Units by mouth every Friday.     . fexofenadine (ALLEGRA) 180 MG tablet Take 180 mg by mouth daily as needed for allergies.     . furosemide (LASIX) 40 MG tablet Take 1 tablet (40 mg total) by mouth 2 (two) times daily. 60 tablet 0  . gabapentin (NEURONTIN) 300 MG capsule Take 900 mg by mouth 3 (three) times daily.     Marland Kitchen glipiZIDE (GLUCOTROL XL) 10 MG 24 hr tablet Take 10 mg by mouth daily with breakfast.     . hydrocortisone cream 1 % Apply topically 2 (two) times daily. Areas of itch 30 g 0  .  isosorbide mononitrate (IMDUR) 60 MG 24 hr tablet Take 1 tablet (60 mg total) by mouth daily. 30 tablet 2  . linaclotide (LINZESS) 145 MCG CAPS capsule Take 145 mcg by mouth daily as needed (constipation).     . Multiple Vitamin (MULTIVITAMIN WITH MINERALS) TABS tablet Take 1 tablet by mouth daily.    Marland Kitchen oxyCODONE-acetaminophen (PERCOCET/ROXICET) 5-325 MG tablet Take 0.5 tablets by mouth every 6 (six) hours as needed for moderate pain or severe pain.     . pantoprazole (PROTONIX) 40 MG tablet Take 1 tablet (40 mg total) by mouth daily. 30 tablet 11  . polyethylene glycol (MIRALAX / GLYCOLAX) packet Take 17 g by mouth daily as needed for mild constipation. 14 each 0  . triamcinolone cream (KENALOG) 0.1 % Apply 1 application topically daily  as needed (itching on back).     . triamcinolone ointment (KENALOG) 0.1 % Apply 1 application topically 2 (two) times daily. Gel on inside of ear    . hydrALAZINE (APRESOLINE) 25 MG tablet Take 3 tablets (75 mg total) by mouth 3 (three) times daily. 270 tablet 2   No current facility-administered medications for this visit.     Allergies as of 02/06/2019 - Review Complete 02/06/2019  Allergen Reaction Noted  . Aspirin Other (See Comments) 09/25/2017  . Allopurinol  10/24/2015  . Amlodipine Other (See Comments) 04/10/2018  . Lisinopril Swelling 04/10/2018  . Other Itching 09/17/2018    Family History  Problem Relation Age of Onset  . Arthritis Mother   . Hypertension Mother   . Heart failure Mother   . Emphysema Mother   . Arthritis Maternal Grandmother   . Hypertension Maternal Grandmother   . Stroke Maternal Grandfather   . Cancer Sister        unknown type cancer  . Heart failure Sister   . Lung disease Sister   . Heart failure Sister   . Colon cancer Neg Hx   . Esophageal cancer Neg Hx   . Stomach cancer Neg Hx   . Rectal cancer Neg Hx     Social History   Socioeconomic History  . Marital status: Married    Spouse name: Not on file  . Number of children: 2  . Years of education: 11  . Highest education level: Not on file  Occupational History  . Occupation: Housewife  Social Needs  . Financial resource strain: Not on file  . Food insecurity    Worry: Not on file    Inability: Not on file  . Transportation needs    Medical: Not on file    Non-medical: Not on file  Tobacco Use  . Smoking status: Current Every Day Smoker    Packs/day: 0.50    Years: 39.00    Pack years: 19.50    Types: Cigarettes  . Smokeless tobacco: Never Used  . Tobacco comment: 1 pack last 3 days  Substance and Sexual Activity  . Alcohol use: No    Alcohol/week: 0.0 standard drinks  . Drug use: Not Currently    Types: Marijuana  . Sexual activity: Not on file  Lifestyle  .  Physical activity    Days per week: Not on file    Minutes per session: Not on file  . Stress: Not on file  Relationships  . Social Herbalist on phone: Not on file    Gets together: Not on file    Attends religious service: Not on file    Active member  of club or organization: Not on file    Attends meetings of clubs or organizations: Not on file    Relationship status: Not on file  . Intimate partner violence    Fear of current or ex partner: Not on file    Emotionally abused: Not on file    Physically abused: Not on file    Forced sexual activity: Not on file  Other Topics Concern  . Not on file  Social History Narrative   Fun: Race video car games - PS2/3, paint by number, board and card games, puzzle books   Feels safe at home and denies abuse   Disabled - does not work from chronic pain   Children - 1 living children, 3 step children   Grandchildren - 23   Married - lives with husband   Uses [employer] transportation     Physical Exam: BP (!) 160/88   Pulse 88   Temp (!) 97.5 F (36.4 C)   Ht 5\' 8"  (1.727 m)   Wt 247 lb (112 kg)   BMI 37.56 kg/m  Constitutional: generally well-appearing Psychiatric: alert and oriented x3 Eyes: extraocular movements intact Mouth: oral pharynx moist, no lesions Neck: supple no lymphadenopathy Cardiovascular: heart regular rate and rhythm Lungs: clear to auscultation bilaterally Abdomen: soft, nontender, nondistended, no obvious ascites, no peritoneal signs, normal bowel sounds; large lower abdominal pannus without discrete masses, no suggestion of abscess or cellulitis Extremities: no lower extremity edema bilaterally Skin: no lesions on visible extremities   Assessment and plan: 68 y.o. female with left lower quadrant pains  She and her husband are very clear that her left lower abdomen pannus is uncomfortable.  I reviewed her CAT scans extensively and there is nothing wrong intra-abdominally.  She does have a large  lower abdominal pannus and I suspect that is causing some stretching of the skin especially in the left side.  There are no signs of abscess or cellulitis.  I reassured her that absolutely she does not have anything like a cancer going on.  I recommended that she try to lose weight by focusing on lower calorie intake on a daily basis.  I see no reason for any further blood tests or imaging studies.    Please see the "Patient Instructions" section for addition details about the plan.   Owens Loffler, MD [facility] 02/06/2019, 2:42 PM  Cc: Antony Contras, MD

## 2019-02-13 ENCOUNTER — Other Ambulatory Visit: Payer: Self-pay

## 2019-02-13 ENCOUNTER — Inpatient Hospital Stay (HOSPITAL_COMMUNITY): Payer: PPO

## 2019-02-13 ENCOUNTER — Emergency Department (HOSPITAL_COMMUNITY): Payer: PPO

## 2019-02-13 ENCOUNTER — Inpatient Hospital Stay (HOSPITAL_COMMUNITY)
Admission: EM | Admit: 2019-02-13 | Discharge: 2019-02-18 | DRG: 291 | Disposition: A | Discharge status: Home Health | Payer: PPO | Attending: Internal Medicine | Admitting: Internal Medicine

## 2019-02-13 ENCOUNTER — Encounter (HOSPITAL_COMMUNITY): Payer: Self-pay | Admitting: Emergency Medicine

## 2019-02-13 ENCOUNTER — Other Ambulatory Visit (HOSPITAL_COMMUNITY): Payer: PPO

## 2019-02-13 DIAGNOSIS — I5043 Acute on chronic combined systolic (congestive) and diastolic (congestive) heart failure: Secondary | ICD-10-CM | POA: Diagnosis not present

## 2019-02-13 DIAGNOSIS — R809 Proteinuria, unspecified: Secondary | ICD-10-CM

## 2019-02-13 DIAGNOSIS — G4733 Obstructive sleep apnea (adult) (pediatric): Secondary | ICD-10-CM | POA: Diagnosis not present

## 2019-02-13 DIAGNOSIS — I5021 Acute systolic (congestive) heart failure: Secondary | ICD-10-CM | POA: Diagnosis not present

## 2019-02-13 DIAGNOSIS — I130 Hypertensive heart and chronic kidney disease with heart failure and stage 1 through stage 4 chronic kidney disease, or unspecified chronic kidney disease: Principal | ICD-10-CM | POA: Diagnosis present

## 2019-02-13 DIAGNOSIS — J9601 Acute respiratory failure with hypoxia: Secondary | ICD-10-CM | POA: Diagnosis present

## 2019-02-13 DIAGNOSIS — Z888 Allergy status to other drugs, medicaments and biological substances status: Secondary | ICD-10-CM

## 2019-02-13 DIAGNOSIS — R05 Cough: Secondary | ICD-10-CM | POA: Diagnosis not present

## 2019-02-13 DIAGNOSIS — E875 Hyperkalemia: Secondary | ICD-10-CM | POA: Diagnosis not present

## 2019-02-13 DIAGNOSIS — G8929 Other chronic pain: Secondary | ICD-10-CM | POA: Diagnosis present

## 2019-02-13 DIAGNOSIS — Z961 Presence of intraocular lens: Secondary | ICD-10-CM | POA: Diagnosis present

## 2019-02-13 DIAGNOSIS — E1129 Type 2 diabetes mellitus with other diabetic kidney complication: Secondary | ICD-10-CM | POA: Diagnosis not present

## 2019-02-13 DIAGNOSIS — R778 Other specified abnormalities of plasma proteins: Secondary | ICD-10-CM | POA: Diagnosis not present

## 2019-02-13 DIAGNOSIS — M545 Low back pain: Secondary | ICD-10-CM | POA: Diagnosis present

## 2019-02-13 DIAGNOSIS — Z823 Family history of stroke: Secondary | ICD-10-CM

## 2019-02-13 DIAGNOSIS — J449 Chronic obstructive pulmonary disease, unspecified: Secondary | ICD-10-CM | POA: Diagnosis present

## 2019-02-13 DIAGNOSIS — N183 Chronic kidney disease, stage 3 unspecified: Secondary | ICD-10-CM | POA: Diagnosis present

## 2019-02-13 DIAGNOSIS — I5020 Unspecified systolic (congestive) heart failure: Secondary | ICD-10-CM | POA: Diagnosis not present

## 2019-02-13 DIAGNOSIS — E119 Type 2 diabetes mellitus without complications: Secondary | ICD-10-CM | POA: Diagnosis not present

## 2019-02-13 DIAGNOSIS — I361 Nonrheumatic tricuspid (valve) insufficiency: Secondary | ICD-10-CM | POA: Diagnosis not present

## 2019-02-13 DIAGNOSIS — I420 Dilated cardiomyopathy: Secondary | ICD-10-CM | POA: Diagnosis not present

## 2019-02-13 DIAGNOSIS — R079 Chest pain, unspecified: Secondary | ICD-10-CM | POA: Diagnosis not present

## 2019-02-13 DIAGNOSIS — I10 Essential (primary) hypertension: Secondary | ICD-10-CM | POA: Diagnosis present

## 2019-02-13 DIAGNOSIS — N179 Acute kidney failure, unspecified: Secondary | ICD-10-CM | POA: Diagnosis present

## 2019-02-13 DIAGNOSIS — Z9851 Tubal ligation status: Secondary | ICD-10-CM

## 2019-02-13 DIAGNOSIS — I5023 Acute on chronic systolic (congestive) heart failure: Secondary | ICD-10-CM | POA: Diagnosis not present

## 2019-02-13 DIAGNOSIS — E8770 Fluid overload, unspecified: Secondary | ICD-10-CM | POA: Diagnosis not present

## 2019-02-13 DIAGNOSIS — E1140 Type 2 diabetes mellitus with diabetic neuropathy, unspecified: Secondary | ICD-10-CM | POA: Diagnosis not present

## 2019-02-13 DIAGNOSIS — I110 Hypertensive heart disease with heart failure: Secondary | ICD-10-CM | POA: Diagnosis not present

## 2019-02-13 DIAGNOSIS — Z20828 Contact with and (suspected) exposure to other viral communicable diseases: Secondary | ICD-10-CM | POA: Diagnosis not present

## 2019-02-13 DIAGNOSIS — I5033 Acute on chronic diastolic (congestive) heart failure: Secondary | ICD-10-CM | POA: Diagnosis not present

## 2019-02-13 DIAGNOSIS — R1011 Right upper quadrant pain: Secondary | ICD-10-CM | POA: Diagnosis not present

## 2019-02-13 DIAGNOSIS — Z825 Family history of asthma and other chronic lower respiratory diseases: Secondary | ICD-10-CM

## 2019-02-13 DIAGNOSIS — R609 Edema, unspecified: Secondary | ICD-10-CM | POA: Diagnosis not present

## 2019-02-13 DIAGNOSIS — M810 Age-related osteoporosis without current pathological fracture: Secondary | ICD-10-CM | POA: Diagnosis present

## 2019-02-13 DIAGNOSIS — Z720 Tobacco use: Secondary | ICD-10-CM | POA: Diagnosis not present

## 2019-02-13 DIAGNOSIS — K828 Other specified diseases of gallbladder: Secondary | ICD-10-CM | POA: Diagnosis present

## 2019-02-13 DIAGNOSIS — N170 Acute kidney failure with tubular necrosis: Secondary | ICD-10-CM | POA: Diagnosis not present

## 2019-02-13 DIAGNOSIS — M25519 Pain in unspecified shoulder: Secondary | ICD-10-CM | POA: Diagnosis present

## 2019-02-13 DIAGNOSIS — Z8261 Family history of arthritis: Secondary | ICD-10-CM

## 2019-02-13 DIAGNOSIS — F1210 Cannabis abuse, uncomplicated: Secondary | ICD-10-CM | POA: Diagnosis present

## 2019-02-13 DIAGNOSIS — Z886 Allergy status to analgesic agent status: Secondary | ICD-10-CM

## 2019-02-13 DIAGNOSIS — Z8701 Personal history of pneumonia (recurrent): Secondary | ICD-10-CM

## 2019-02-13 DIAGNOSIS — K8020 Calculus of gallbladder without cholecystitis without obstruction: Secondary | ICD-10-CM | POA: Diagnosis not present

## 2019-02-13 DIAGNOSIS — R1013 Epigastric pain: Secondary | ICD-10-CM | POA: Diagnosis not present

## 2019-02-13 DIAGNOSIS — K219 Gastro-esophageal reflux disease without esophagitis: Secondary | ICD-10-CM | POA: Diagnosis not present

## 2019-02-13 DIAGNOSIS — N189 Chronic kidney disease, unspecified: Secondary | ICD-10-CM | POA: Diagnosis not present

## 2019-02-13 DIAGNOSIS — I429 Cardiomyopathy, unspecified: Secondary | ICD-10-CM | POA: Diagnosis not present

## 2019-02-13 DIAGNOSIS — Z79899 Other long term (current) drug therapy: Secondary | ICD-10-CM

## 2019-02-13 DIAGNOSIS — E1122 Type 2 diabetes mellitus with diabetic chronic kidney disease: Secondary | ICD-10-CM | POA: Diagnosis not present

## 2019-02-13 DIAGNOSIS — Z9842 Cataract extraction status, left eye: Secondary | ICD-10-CM

## 2019-02-13 DIAGNOSIS — N184 Chronic kidney disease, stage 4 (severe): Secondary | ICD-10-CM | POA: Diagnosis present

## 2019-02-13 DIAGNOSIS — K7460 Unspecified cirrhosis of liver: Secondary | ICD-10-CM | POA: Diagnosis not present

## 2019-02-13 DIAGNOSIS — Z209 Contact with and (suspected) exposure to unspecified communicable disease: Secondary | ICD-10-CM | POA: Diagnosis not present

## 2019-02-13 DIAGNOSIS — N1832 Chronic kidney disease, stage 3b: Secondary | ICD-10-CM

## 2019-02-13 DIAGNOSIS — Z6837 Body mass index (BMI) 37.0-37.9, adult: Secondary | ICD-10-CM

## 2019-02-13 DIAGNOSIS — D631 Anemia in chronic kidney disease: Secondary | ICD-10-CM | POA: Diagnosis present

## 2019-02-13 DIAGNOSIS — I340 Nonrheumatic mitral (valve) insufficiency: Secondary | ICD-10-CM | POA: Diagnosis not present

## 2019-02-13 DIAGNOSIS — Z79891 Long term (current) use of opiate analgesic: Secondary | ICD-10-CM

## 2019-02-13 DIAGNOSIS — I5040 Unspecified combined systolic (congestive) and diastolic (congestive) heart failure: Secondary | ICD-10-CM | POA: Diagnosis not present

## 2019-02-13 DIAGNOSIS — R0602 Shortness of breath: Secondary | ICD-10-CM | POA: Diagnosis not present

## 2019-02-13 DIAGNOSIS — D638 Anemia in other chronic diseases classified elsewhere: Secondary | ICD-10-CM

## 2019-02-13 DIAGNOSIS — F17210 Nicotine dependence, cigarettes, uncomplicated: Secondary | ICD-10-CM | POA: Diagnosis present

## 2019-02-13 DIAGNOSIS — Z8249 Family history of ischemic heart disease and other diseases of the circulatory system: Secondary | ICD-10-CM

## 2019-02-13 DIAGNOSIS — R748 Abnormal levels of other serum enzymes: Secondary | ICD-10-CM | POA: Diagnosis not present

## 2019-02-13 DIAGNOSIS — K811 Chronic cholecystitis: Secondary | ICD-10-CM | POA: Diagnosis present

## 2019-02-13 DIAGNOSIS — I129 Hypertensive chronic kidney disease with stage 1 through stage 4 chronic kidney disease, or unspecified chronic kidney disease: Secondary | ICD-10-CM | POA: Diagnosis not present

## 2019-02-13 DIAGNOSIS — Z7984 Long term (current) use of oral hypoglycemic drugs: Secondary | ICD-10-CM

## 2019-02-13 DIAGNOSIS — I509 Heart failure, unspecified: Secondary | ICD-10-CM

## 2019-02-13 LAB — CBC WITH DIFFERENTIAL/PLATELET
Abs Immature Granulocytes: 0.04 10*3/uL (ref 0.00–0.07)
Basophils Absolute: 0 10*3/uL (ref 0.0–0.1)
Basophils Relative: 0 %
Eosinophils Absolute: 0.1 10*3/uL (ref 0.0–0.5)
Eosinophils Relative: 1 %
HCT: 25.8 % — ABNORMAL LOW (ref 36.0–46.0)
Hemoglobin: 7.9 g/dL — ABNORMAL LOW (ref 12.0–15.0)
Immature Granulocytes: 1 %
Lymphocytes Relative: 22 %
Lymphs Abs: 1.8 10*3/uL (ref 0.7–4.0)
MCH: 26.7 pg (ref 26.0–34.0)
MCHC: 30.6 g/dL (ref 30.0–36.0)
MCV: 87.2 fL (ref 80.0–100.0)
Monocytes Absolute: 0.6 10*3/uL (ref 0.1–1.0)
Monocytes Relative: 8 %
Neutro Abs: 5.5 10*3/uL (ref 1.7–7.7)
Neutrophils Relative %: 68 %
Platelets: 184 10*3/uL (ref 150–400)
RBC: 2.96 MIL/uL — ABNORMAL LOW (ref 3.87–5.11)
RDW: 16.4 % — ABNORMAL HIGH (ref 11.5–15.5)
WBC: 8 10*3/uL (ref 4.0–10.5)
nRBC: 0 % (ref 0.0–0.2)

## 2019-02-13 LAB — URINALYSIS, ROUTINE W REFLEX MICROSCOPIC
Bilirubin Urine: NEGATIVE
Glucose, UA: 50 mg/dL — AB
Hgb urine dipstick: NEGATIVE
Ketones, ur: NEGATIVE mg/dL
Leukocytes,Ua: NEGATIVE
Nitrite: NEGATIVE
Protein, ur: >=300 mg/dL — AB
Specific Gravity, Urine: 1.013 (ref 1.005–1.030)
pH: 6 (ref 5.0–8.0)

## 2019-02-13 LAB — RAPID URINE DRUG SCREEN, HOSP PERFORMED
Amphetamines: NOT DETECTED
Barbiturates: NOT DETECTED
Benzodiazepines: NOT DETECTED
Cocaine: NOT DETECTED
Opiates: NOT DETECTED
Tetrahydrocannabinol: NOT DETECTED

## 2019-02-13 LAB — HEPATITIS PANEL, ACUTE
HCV Ab: NONREACTIVE
Hep A IgM: NONREACTIVE
Hep B C IgM: NONREACTIVE
Hepatitis B Surface Ag: NONREACTIVE

## 2019-02-13 LAB — COMPREHENSIVE METABOLIC PANEL
ALT: 227 U/L — ABNORMAL HIGH (ref 0–44)
AST: 238 U/L — ABNORMAL HIGH (ref 15–41)
Albumin: 2.9 g/dL — ABNORMAL LOW (ref 3.5–5.0)
Alkaline Phosphatase: 119 U/L (ref 38–126)
Anion gap: 9 (ref 5–15)
BUN: 37 mg/dL — ABNORMAL HIGH (ref 8–23)
CO2: 25 mmol/L (ref 22–32)
Calcium: 8.7 mg/dL — ABNORMAL LOW (ref 8.9–10.3)
Chloride: 109 mmol/L (ref 98–111)
Creatinine, Ser: 2.83 mg/dL — ABNORMAL HIGH (ref 0.44–1.00)
GFR calc Af Amer: 19 mL/min — ABNORMAL LOW (ref >60)
GFR calc non Af Amer: 17 mL/min — ABNORMAL LOW (ref >60)
Glucose, Bld: 128 mg/dL — ABNORMAL HIGH (ref 70–99)
Potassium: 6.1 mmol/L — ABNORMAL HIGH (ref 3.5–5.1)
Sodium: 143 mmol/L (ref 135–145)
Total Bilirubin: 0.6 mg/dL (ref 0.3–1.2)
Total Protein: 6.3 g/dL — ABNORMAL LOW (ref 6.5–8.1)

## 2019-02-13 LAB — GLUCOSE, CAPILLARY
Glucose-Capillary: 106 mg/dL — ABNORMAL HIGH (ref 70–99)
Glucose-Capillary: 167 mg/dL — ABNORMAL HIGH (ref 70–99)

## 2019-02-13 LAB — MAGNESIUM: Magnesium: 2.4 mg/dL (ref 1.7–2.4)

## 2019-02-13 LAB — ECHOCARDIOGRAM COMPLETE
Height: 68 in
Weight: 4134.07 oz

## 2019-02-13 LAB — TSH: TSH: 1.872 u[IU]/mL (ref 0.350–4.500)

## 2019-02-13 LAB — TROPONIN I (HIGH SENSITIVITY)
Troponin I (High Sensitivity): 29 ng/L — ABNORMAL HIGH (ref <18)
Troponin I (High Sensitivity): 26 ng/L — ABNORMAL HIGH (ref <18)

## 2019-02-13 LAB — SARS CORONAVIRUS 2 (TAT 6-24 HRS): SARS Coronavirus 2: NEGATIVE

## 2019-02-13 LAB — BRAIN NATRIURETIC PEPTIDE: B Natriuretic Peptide: 2250.1 pg/mL — ABNORMAL HIGH (ref 0.0–100.0)

## 2019-02-13 MED ORDER — POLYETHYLENE GLYCOL 3350 17 G PO PACK: 17.0000 g | PACK | Freq: Every day | ORAL | Status: DC | PRN

## 2019-02-13 MED ORDER — OXYCODONE-ACETAMINOPHEN 5-325 MG PO TABS
0.5000 | ORAL_TABLET | Freq: Four times a day (QID) | ORAL | Status: DC | PRN
  Administered 2019-02-13 – 2019-02-18 (×11): 0.5 via ORAL
  Filled 2019-02-13 (×12): qty 1

## 2019-02-13 MED ORDER — ALBUTEROL SULFATE HFA 108 (90 BASE) MCG/ACT IN AERS
2.0000 | INHALATION_SPRAY | Freq: Four times a day (QID) | RESPIRATORY_TRACT | Status: DC
  Administered 2019-02-13 – 2019-02-14 (×4): 2 via RESPIRATORY_TRACT
  Filled 2019-02-13: qty 6.7

## 2019-02-13 MED ORDER — FUROSEMIDE 10 MG/ML IJ SOLN
60.0000 mg | Freq: Once | INTRAMUSCULAR | Status: AC
  Administered 2019-02-13: 13:00:00 60 mg via INTRAVENOUS
  Filled 2019-02-13: qty 6

## 2019-02-13 MED ORDER — HEPARIN SODIUM (PORCINE) 5000 UNIT/ML IJ SOLN
5000.0000 [IU] | Freq: Three times a day (TID) | INTRAMUSCULAR | Status: DC
  Administered 2019-02-13 – 2019-02-18 (×14): 5000 [IU] via SUBCUTANEOUS
  Filled 2019-02-13 (×14): qty 1

## 2019-02-13 MED ORDER — SODIUM CHLORIDE 0.9 % IV SOLN: 250.0000 mL | INTRAVENOUS | Status: DC | PRN

## 2019-02-13 MED ORDER — GABAPENTIN 300 MG PO CAPS
900.0000 mg | ORAL_CAPSULE | Freq: Once | ORAL | Status: AC
  Administered 2019-02-13: 900 mg via ORAL
  Filled 2019-02-13: qty 3

## 2019-02-13 MED ORDER — ATORVASTATIN CALCIUM 40 MG PO TABS
40.0000 mg | ORAL_TABLET | Freq: Every day | ORAL | Status: DC
  Administered 2019-02-13 – 2019-02-18 (×6): 40 mg via ORAL
  Filled 2019-02-13 (×6): qty 1

## 2019-02-13 MED ORDER — INSULIN ASPART 100 UNIT/ML ~~LOC~~ SOLN: 0.0000 [IU] | Freq: Every day | SUBCUTANEOUS | Status: DC

## 2019-02-13 MED ORDER — SODIUM CHLORIDE 0.9% FLUSH
3.0000 mL | Freq: Two times a day (BID) | INTRAVENOUS | Status: DC
  Administered 2019-02-14 – 2019-02-18 (×9): 3 mL via INTRAVENOUS

## 2019-02-13 MED ORDER — SODIUM CHLORIDE 0.9% FLUSH: 3.0000 mL | INTRAVENOUS | Status: DC | PRN

## 2019-02-13 MED ORDER — INSULIN ASPART 100 UNIT/ML ~~LOC~~ SOLN: 0.0000 [IU] | Freq: Three times a day (TID) | SUBCUTANEOUS | Status: DC

## 2019-02-13 MED ORDER — HYDRALAZINE HCL 50 MG PO TABS
75.0000 mg | ORAL_TABLET | Freq: Three times a day (TID) | ORAL | Status: DC
  Administered 2019-02-13 – 2019-02-15 (×6): 75 mg via ORAL
  Filled 2019-02-13 (×6): qty 1

## 2019-02-13 MED ORDER — ISOSORBIDE MONONITRATE ER 60 MG PO TB24
60.0000 mg | ORAL_TABLET | Freq: Every day | ORAL | Status: DC
  Administered 2019-02-13 – 2019-02-14 (×2): 60 mg via ORAL
  Filled 2019-02-13 (×2): qty 1

## 2019-02-13 MED ORDER — CARVEDILOL 12.5 MG PO TABS
12.5000 mg | ORAL_TABLET | Freq: Two times a day (BID) | ORAL | Status: DC
  Administered 2019-02-13 – 2019-02-14 (×3): 12.5 mg via ORAL
  Filled 2019-02-13 (×3): qty 1

## 2019-02-13 MED ORDER — ONDANSETRON HCL 4 MG/2ML IJ SOLN: 4.0000 mg | Freq: Four times a day (QID) | INTRAMUSCULAR | Status: DC | PRN

## 2019-02-13 MED ORDER — ACETAMINOPHEN 325 MG PO TABS: 650.0000 mg | ORAL_TABLET | ORAL | Status: DC | PRN

## 2019-02-13 MED ORDER — ALBUTEROL SULFATE HFA 108 (90 BASE) MCG/ACT IN AERS
2.0000 | INHALATION_SPRAY | Freq: Four times a day (QID) | RESPIRATORY_TRACT | Status: DC | PRN
  Filled 2019-02-13: qty 6.7

## 2019-02-13 MED ORDER — CYCLOBENZAPRINE HCL 5 MG PO TABS
5.0000 mg | ORAL_TABLET | Freq: Once | ORAL | Status: AC
  Administered 2019-02-13: 5 mg via ORAL
  Filled 2019-02-13: qty 1

## 2019-02-13 MED ORDER — PANTOPRAZOLE SODIUM 40 MG PO TBEC
40.0000 mg | DELAYED_RELEASE_TABLET | Freq: Every day | ORAL | Status: DC
  Administered 2019-02-13 – 2019-02-18 (×6): 40 mg via ORAL
  Filled 2019-02-13: qty 2
  Filled 2019-02-13 (×2): qty 1
  Filled 2019-02-13 (×2): qty 2
  Filled 2019-02-13: qty 1

## 2019-02-13 MED ORDER — FUROSEMIDE 40 MG PO TABS
40.0000 mg | ORAL_TABLET | Freq: Two times a day (BID) | ORAL | Status: DC
  Administered 2019-02-13 – 2019-02-14 (×3): 40 mg via ORAL
  Filled 2019-02-13 (×3): qty 1

## 2019-02-13 MED ORDER — SODIUM ZIRCONIUM CYCLOSILICATE 10 G PO PACK
10.0000 g | PACK | Freq: Once | ORAL | Status: AC
  Administered 2019-02-13: 13:00:00 10 g via ORAL
  Filled 2019-02-13: qty 1

## 2019-02-13 MED ORDER — INSULIN ASPART 100 UNIT/ML ~~LOC~~ SOLN
0.0000 [IU] | Freq: Three times a day (TID) | SUBCUTANEOUS | Status: DC
  Administered 2019-02-14 – 2019-02-15 (×3): 2 [IU] via SUBCUTANEOUS
  Administered 2019-02-15: 3 [IU] via SUBCUTANEOUS
  Administered 2019-02-16 – 2019-02-18 (×6): 2 [IU] via SUBCUTANEOUS
  Administered 2019-02-18: 06:00:00 3 [IU] via SUBCUTANEOUS

## 2019-02-13 NOTE — ED Triage Notes (Signed)
Pt arrives via EMS from home with increased SOB since 8pm last night. Has had 15lb weight gain over the last 2 days. Noted to having increased lower extremity edema, productive cough. Hx of CHF. Uses walker to ambulate. Alert, oriented x4. VSS. Has not taken meds today.

## 2019-02-13 NOTE — CV Procedure (Signed)
2D echo attempted at 2:58pm in ED, patient was in route back to room. RN advised she would go directly up to 3E29.

## 2019-02-13 NOTE — Progress Notes (Signed)
PT Cancellation Note  Patient Details Name: Zoe Clarke MRN: [id number] DOB: [birth year]   Cancelled Treatment:    Reason Eval/Treat Not Completed: Patient declined, no reason specified. PT attempted to see pt for evaluation however pt declining, reporting she just arrived to the floor from the ED and is fatigued and exhausted, needing to rest. PT attempts to encourage pt to participate however pt declines, requesting PT return in morning. PT will follow up as time allows.   Zenaida Niece 02/13/2019, 5:00 PM

## 2019-02-13 NOTE — Progress Notes (Signed)
  Echocardiogram 2D Echocardiogram has been performed.  Bobbye Charleston 02/13/2019, 4:12 PM

## 2019-02-13 NOTE — H&P (Signed)
History and Physical    Zoe Clarke VXB:[id number] DOB: 1950-04-05 DOA: 02/13/2019  PCP: Antony Contras, MD  Patient coming from: Home I have personally briefly reviewed patient's old medical records in [place]  Chief Complaint: Weight gain and shortness of breath  HPI: Zoe Clarke is a 68 y.o. female with medical history significant of CKD stage IIIb, hypertension, cholelithiasis, morbid obesity, tobacco abuse, marijuana abuse, chronic diastolic congestive heart failure, anemia of chronic disease presents to emergency department due to worsening shortness of breath and weight gain.  Patient tells me that she gained about 15 pounds in 3 days, has leg swelling, orthopnea, PND and worsening shortness of breath.  She tells me that she is compliant with her home medication, denies high sodium diet, increase water intake, decreased urinary output, nausea, vomiting, diarrhea, cough, congestion, headache, blurry vision, chest pain, palpitation, bowel or sleep changes.  She lives with her husband, smokes few cigarettes per day, uses marijuana and denies alcohol intake.  She uses walker for ambulation.  Patient recently admitted with pneumonia on 12/20/2018 and discharged home on 12/22/2018.  She recently evaluated by gastroenterologist Dr. Ardis Hughs on 02/06/2019 outpatient regarding chronic abdominal pain and was reassured that her pain is likely coming from abdominal pannus.  ED Course: Upon arrival: Patient's blood pressure elevated, on 2 L of oxygen via nasal cannula, worsening kidney function, potassium: 6.1, elevated liver enzymes, BNP elevated at 2250, chest x-ray shows cardiomegaly with no pulmonary edema, UA negative.  COVID-19 pending.  Patient received Lasix and Lokelma in the ED.  Review of Systems: As per HPI otherwise negative.    Past Medical History:  Diagnosis Date  . Allergy   . Arthritis   . Asthma    weazing at night uses inhalers  . Bronchitis    history  of  . Chronic diastolic CHF (congestive heart failure) ([place]) 04/2018  . CKD (chronic kidney disease), stage III   . Diabetes mellitus without complication ([place])   . Gastric ulcer   . GERD (gastroesophageal reflux disease)   . History of hiatal hernia   . Hypertension   . Osteoporosis   . Sleep apnea 09/22/2018    Past Surgical History:  Procedure Laterality Date  . DILATION AND CURETTAGE OF UTERUS    . EYE SURGERY Left    removed cataract w/ lens  . TUBAL LIGATION       reports that she has been smoking cigarettes. She has a 19.50 pack-year smoking history. She has never used smokeless tobacco. She reports previous drug use. Drug: Marijuana. She reports that she does not drink alcohol.  Allergies  Allergen Reactions  . Aspirin Other (See Comments)    Feels funny   . Allopurinol     Dizzy; hard to ambulate  Couldn't see; sweating  . Amlodipine Other (See Comments)    headache  . Lisinopril Swelling    Face swelling  . Other Itching    Squash    Family History  Problem Relation Age of Onset  . Arthritis Mother   . Hypertension Mother   . Heart failure Mother   . Emphysema Mother   . Arthritis Maternal Grandmother   . Hypertension Maternal Grandmother   . Stroke Maternal Grandfather   . Cancer Sister        unknown type cancer  . Heart failure Sister   . Lung disease Sister   . Heart failure Sister   . Colon cancer Neg Hx   . Esophageal  cancer Neg Hx   . Stomach cancer Neg Hx   . Rectal cancer Neg Hx     Prior to Admission medications   Medication Sig Start Date End Date Taking? Authorizing Provider  albuterol (PROVENTIL HFA;VENTOLIN HFA) 108 (90 Base) MCG/ACT inhaler Inhale 2 puffs into the lungs every 6 (six) hours as needed for wheezing or shortness of breath. 04/14/18   Bonnell Public, MD  atorvastatin (LIPITOR) 40 MG tablet Take 40 mg by mouth daily.    [provider]  carvedilol (COREG) 12.5 MG tablet Take 1 tablet (12.5 mg total) by mouth 2  (two) times daily with a meal. 09/30/18   Pokhrel, Laxman, MD  cyclobenzaprine (FLEXERIL) 5 MG tablet Take 5-10 mg by mouth at bedtime.     [provider]  ergocalciferol (VITAMIN D2) 1.25 MG (50000 UT) capsule Take 50,000 Units by mouth every Friday.     [provider]  fexofenadine (ALLEGRA) 180 MG tablet Take 180 mg by mouth daily as needed for allergies.     [provider]  furosemide (LASIX) 40 MG tablet Take 1 tablet (40 mg total) by mouth 2 (two) times daily. 11/11/18 02/16/19  Arrien, Jimmy Picket, MD  gabapentin (NEURONTIN) 300 MG capsule Take 900 mg by mouth 3 (three) times daily.     [provider]  glipiZIDE (GLUCOTROL XL) 10 MG 24 hr tablet Take 10 mg by mouth daily with breakfast.     [provider]  hydrALAZINE (APRESOLINE) 25 MG tablet Take 3 tablets (75 mg total) by mouth 3 (three) times daily. 09/30/18 01/20/19  Pokhrel, Corrie Mckusick, MD  hydrocortisone cream 1 % Apply topically 2 (two) times daily. Areas of itch 09/30/18   Pokhrel, Corrie Mckusick, MD  isosorbide mononitrate (IMDUR) 60 MG 24 hr tablet Take 1 tablet (60 mg total) by mouth daily. 10/01/18 02/16/19  Pokhrel, Corrie Mckusick, MD  linaclotide (LINZESS) 145 MCG CAPS capsule Take 145 mcg by mouth daily as needed (constipation).     [provider]  Multiple Vitamin (MULTIVITAMIN WITH MINERALS) TABS tablet Take 1 tablet by mouth daily.    [provider]  oxyCODONE-acetaminophen (PERCOCET/ROXICET) 5-325 MG tablet Take 0.5 tablets by mouth every 6 (six) hours as needed for moderate pain or severe pain.     [provider]  pantoprazole (PROTONIX) 40 MG tablet Take 1 tablet (40 mg total) by mouth daily. 07/14/18   Skeet Latch, MD  polyethylene glycol [facility] / [name] packet Take 17 g by mouth daily as needed for mild constipation. 04/14/18   Dana Allan I, MD  triamcinolone cream (KENALOG) 0.1 % Apply 1 application topically daily as needed (itching on back).      [provider]  triamcinolone ointment (KENALOG) 0.1 % Apply 1 application topically 2 (two) times daily. Gel on inside of ear    [provider]    Physical Exam: Vitals:   02/13/19 1033 02/13/19 1035 02/13/19 1245  BP:  (!) 188/81 (!) 134/101  Pulse:  84 63  Resp:  (!) 22 17  Temp:  99 F (37.2 C)   TempSrc:  Oral   SpO2: 94% 94% 98%  Weight: 118.8 kg    Height: 5\' 8"  (1.727 m)      Constitutional: NAD, calm, comfortable, on 2 L of oxygen via nasal cannula.   Eyes: PERRL, lids and conjunctivae normal ENMT: Mucous membranes are moist. Posterior pharynx clear of any exudate or lesions.Normal dentition.  Neck: normal, supple, no masses, no thyromegaly Respiratory:  clear to auscultation bilaterally, no wheezing, no crackles. Normal respiratory effort. No accessory muscle use.  Cardiovascular: Regular rate and rhythm, no murmurs / rubs / gallops.  2+ bilateral pitting edema positive. 2+ pedal pulses. No carotid bruits.  Abdomen: no tenderness, no masses palpated. No hepatosplenomegaly. Bowel sounds positive.  Musculoskeletal: no clubbing / cyanosis. No joint deformity upper and lower extremities. Good ROM, no contractures. Normal muscle tone.  Skin: no rashes, lesions, ulcers. No induration Neurologic: CN 2-12 grossly intact. Sensation intact, DTR normal. Strength 5/5 in all 4.  Psychiatric: Normal judgment and insight. Alert and oriented x 3. Normal mood.    Labs on Admission: I have personally reviewed following labs and imaging studies  CBC: Recent Labs  Lab 02/13/19 1102  WBC 8.0  NEUTROABS 5.5  HGB 7.9*  HCT 25.8*  MCV 87.2  PLT 017   Basic Metabolic Panel: Recent Labs  Lab 02/13/19 1102  NA 143  K 6.1*  CL 109  CO2 25  GLUCOSE 128*  BUN 37*  CREATININE 2.83*  CALCIUM 8.7*   GFR: Estimated Creatinine Clearance: 26.5 mL/min (A) (by C-G formula based on SCr of 2.83 mg/dL (H)). Liver Function Tests: Recent Labs  Lab 02/13/19 1102  AST  238*  ALT 227*  ALKPHOS 119  BILITOT 0.6  PROT 6.3*  ALBUMIN 2.9*   No results for input(s): LIPASE, AMYLASE in the last 168 hours. No results for input(s): AMMONIA in the last 168 hours. Coagulation Profile: No results for input(s): INR, PROTIME in the last 168 hours. Cardiac Enzymes: No results for input(s): CKTOTAL, CKMB, CKMBINDEX, TROPONINI in the last 168 hours. BNP (last 3 results) No results for input(s): PROBNP in the last 8760 hours. HbA1C: No results for input(s): HGBA1C in the last 72 hours. CBG: No results for input(s): GLUCAP in the last 168 hours. Lipid Profile: No results for input(s): CHOL, HDL, LDLCALC, TRIG, CHOLHDL, LDLDIRECT in the last 72 hours. Thyroid Function Tests: No results for input(s): TSH, T4TOTAL, FREET4, T3FREE, THYROIDAB in the last 72 hours. Anemia Panel: No results for input(s): VITAMINB12, FOLATE, FERRITIN, TIBC, IRON, RETICCTPCT in the last 72 hours. Urine analysis:    Component Value Date/Time   COLORURINE YELLOW 02/13/2019 1103   APPEARANCEUR CLEAR 02/13/2019 1103   LABSPEC 1.013 02/13/2019 1103   PHURINE 6.0 02/13/2019 1103   GLUCOSEU 50 (A) 02/13/2019 1103   HGBUR NEGATIVE 02/13/2019 1103   BILIRUBINUR NEGATIVE 02/13/2019 1103   BILIRUBINUR negative 02/25/2015 1426   KETONESUR NEGATIVE 02/13/2019 1103   PROTEINUR >=300 (A) 02/13/2019 1103   UROBILINOGEN 0.2 02/25/2015 1426   UROBILINOGEN 1.0 04/16/2009 0943   NITRITE NEGATIVE 02/13/2019 1103   LEUKOCYTESUR NEGATIVE 02/13/2019 1103    Radiological Exams on Admission: Dg Chest Port 1 View  Result Date: 02/13/2019 CLINICAL DATA:  68 year old presenting with acute onset of shortness of breath that began last night, associated with a productive cough. Patient states a 15 pound weight gain over the last 2 days. Current smoker with current history of CHF. EXAM: PORTABLE CHEST 1 VIEW COMPARISON:  12/20/2018 and earlier. FINDINGS: Cardiac silhouette markedly enlarged, unchanged.  Pulmonary vascularity normal without evidence of pulmonary edema currently. Lungs clear. Possible small BILATERAL pleural effusions. IMPRESSION: 1. Stable marked cardiomegaly without pulmonary edema. 2. Possible small BILATERAL pleural effusions. No acute cardiopulmonary disease otherwise. Electronically Signed   By: Evangeline Dakin M.D.   On: 02/13/2019 10:53    EKG: Normal sinus rhythm, no ST elevation or depression noted.  Assessment/Plan Principal Problem:  Acute on chronic congestive heart failure (HCC) Active Problems:   Diabetes mellitus (HCC)   Tobacco abuse   CKD (chronic kidney disease), stage III   Anemia of chronic disease   Cholelithiasis   Marijuana abuse   Hyperkalemia   Elevated liver enzymes   Acute on chronic congestive heart failure: -Patient presented with weight gain of 15 pounds, leg swelling, worsening exertional shortness of breath. -Admit patient on the floor for close monitoring.  Currently on 2 L of oxygen via nasal cannula-will try to wean off oxygen, on continuous pulse ox.  COVID-19 pending. -BNP elevated at 2250, chest x-ray shows cardiomegaly but no pulmonary edema. -Reviewed echo from 04/28/2018 which showed ejection fraction of 60 to 65% with impaired diastolic relaxation. -We will repeat echo. -Start on Lasix 40 mg IV twice daily, monitor electrolytes, strict INO's and daily weight.  -Check troponin, TSH, UA and UDS. -Continue Coreg, statin, Imdur  Uncontrolled hypertension: Blood pressure elevated upon arrival. -Continue Coreg, hydralazine, Imdur -Monitor blood pressure closely.  Diabetes mellitus: Well controlled. -Last A1c 6.2 on 12/20/2018 -Hold glipizide, start on sliding scale insulin and monitor blood sugar closely.  Elevated liver enzymes: -Could be secondary to hepatic congestion in the setting of acute on chronic congestive heart failure. -Ordered acute hepatitis panel, right upper quadrant ultrasound -Monitor liver function closely.   Total bilirubin: WNL.  CKD stage IV: -GFR trended down from 21-19 and creatinine trended up from 2.29-2.83 in 1 month. -In the setting of acute congestive heart failure. -Avoid nephrotoxic medication and monitor kidney function closely.  Hyperkalemia: -In the setting of CKD stage IIIb. -Patient received Lokelma in the ED. -EKG no T wave changes.  On telemetry -Repeat BMP tomorrow a.m.  Anemia of chronic disease: -H&H is currently stable.  No signs of bleeding. -Monitor H&H and transfuse as needed.  Tobacco/marijuana abuse: Counseled about cessation  GERD: Stable continue Protonix   DVT prophylaxis: TED/SCD/Heparin Code Status: Full code-confirmed with the patient Family Communication: None present at bedside.  Plan of care discussed with patient in length and she verbalized understanding and agreed with it. Disposition Plan: TBD Consults called: None Admission status: Inpatient   Mckinley Jewel MD [facility] Pager [phone]  If 7PM-7AM, please contact night-coverage [email or web address] Password [email or web address]  02/13/2019, 2:01 PM

## 2019-02-13 NOTE — ED Provider Notes (Signed)
[facility] EMERGENCY DEPARTMENT Provider Note   CSN: [id number] Arrival date & time: 02/13/19  1029     History   Chief Complaint Chief Complaint  Patient presents with  . Shortness of Breath    HPI Zoe Clarke is a 68 y.o. female.     HPI Patient presents from home via EMS. Patient self is awake and alert, corroborates details, which are provided by EMS providers, and adds elements herself. She presents due to 2 days of increased work of breathing, with weight gain, generalized weakness. No focal weakness, no focal pain. Patient has been taking all her medication including Lasix as directed. However, over the past 2 days she has developed weakness and fatigue as above, as well as had 15 pounds of weight gain. She does not wear home oxygen, was not hypoxic on EMS arrival, but did subjectively feel better with 4 L via nasal cannula. She has also tried using her albuterol without apparent change in her condition. No reported fever, fall, syncope, chest pain. Today the patient was at home, went home health care performed an evaluation, and after finding her to be edematous, with after mentioned weight gain, weakness, dyspnea she was sent here for evaluation. Past Medical History:  Diagnosis Date  . Allergy   . Arthritis   . Asthma    weazing at night uses inhalers  . Bronchitis    history of  . Chronic diastolic CHF (congestive heart failure) ([place]) 04/2018  . CKD (chronic kidney disease), stage III   . Diabetes mellitus without complication ([place])   . Gastric ulcer   . GERD (gastroesophageal reflux disease)   . History of hiatal hernia   . Hypertension   . Osteoporosis   . Sleep apnea 09/22/2018    Patient Active Problem List   Diagnosis Date Noted  . Atelectasis 12/22/2018  . CAP (community acquired pneumonia) 12/20/2018  . Obesity, Class III, BMI 40-49.9 (morbid obesity) ([place]) 12/20/2018  . Marijuana abuse 12/20/2018  . Dysuria  12/20/2018  . Chronic diastolic CHF (congestive heart failure) ([place]) 11/06/2018  . 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 Thyroid nodule 09/22/2018  . Anemia, chronic renal failure, stage 3 (moderate) 09/22/2018  . Sleep apnea 09/22/2018  . Precordial chest pain   . Cholelithiasis 07/30/2018  . Acute on chronic combined systolic (congestive) and diastolic (congestive) heart failure ([place]) 07/29/2018  . Dyspnea 07/29/2018  . Tobacco abuse counseling 04/27/2018  . Acute pyelonephritis 04/10/2018  . Hypertensive urgency 04/10/2018  . Leukocytosis 09/10/2017  . Anemia of chronic disease 09/10/2017  . Knee pain, bilateral 01/13/2015  . Bilateral hip pain 11/23/2014  . Diabetes mellitus ([place]) 10/25/2014  . Intertrigo 10/25/2014  . GERD (gastroesophageal reflux disease) 10/25/2014  . Diabetic neuropathy ([place]) 10/25/2014  . Chronic low back pain 10/25/2014  . Tobacco abuse 11/11/2013  . CKD (chronic kidney disease), stage III 09/02/2013  . Uncontrolled hypertension 09/02/2013  . Gout 12/05/2012    Past Surgical History:  Procedure Laterality Date  . DILATION AND CURETTAGE OF UTERUS    . EYE SURGERY Left    removed cataract w/ lens  . TUBAL LIGATION       OB History   No obstetric history on file.      Home Medications    Prior to Admission medications   Medication Sig Start Date End Date Taking? Authorizing Provider  albuterol (  PROVENTIL HFA;VENTOLIN HFA) 108 (90 Base) MCG/ACT inhaler Inhale 2 puffs into the lungs every 6 (six) hours as needed for wheezing or shortness of breath. 04/14/18   Bonnell Public, MD  atorvastatin (LIPITOR) 40 MG tablet Take 40 mg by mouth daily.    [provider]  carvedilol (COREG) 12.5 MG tablet Take 1 tablet (12.5 mg total) by mouth 2 (two) times daily with a meal. 09/30/18   Pokhrel, Laxman, MD  cyclobenzaprine  (FLEXERIL) 5 MG tablet Take 5-10 mg by mouth at bedtime.     [provider]  ergocalciferol (VITAMIN D2) 1.25 MG (50000 UT) capsule Take 50,000 Units by mouth every Friday.     [provider]  fexofenadine (ALLEGRA) 180 MG tablet Take 180 mg by mouth daily as needed for allergies.     [provider]  furosemide (LASIX) 40 MG tablet Take 1 tablet (40 mg total) by mouth 2 (two) times daily. 11/11/18 02/16/19  Arrien, Jimmy Picket, MD  gabapentin (NEURONTIN) 300 MG capsule Take 900 mg by mouth 3 (three) times daily.     [provider]  glipiZIDE (GLUCOTROL XL) 10 MG 24 hr tablet Take 10 mg by mouth daily with breakfast.     [provider]  hydrALAZINE (APRESOLINE) 25 MG tablet Take 3 tablets (75 mg total) by mouth 3 (three) times daily. 09/30/18 01/20/19  Pokhrel, Corrie Mckusick, MD  hydrocortisone cream 1 % Apply topically 2 (two) times daily. Areas of itch 09/30/18   Pokhrel, Corrie Mckusick, MD  isosorbide mononitrate (IMDUR) 60 MG 24 hr tablet Take 1 tablet (60 mg total) by mouth daily. 10/01/18 02/16/19  Pokhrel, Corrie Mckusick, MD  linaclotide (LINZESS) 145 MCG CAPS capsule Take 145 mcg by mouth daily as needed (constipation).     [provider]  Multiple Vitamin (MULTIVITAMIN WITH MINERALS) TABS tablet Take 1 tablet by mouth daily.    [provider]  oxyCODONE-acetaminophen (PERCOCET/ROXICET) 5-325 MG tablet Take 0.5 tablets by mouth every 6 (six) hours as needed for moderate pain or severe pain.     [provider]  pantoprazole (PROTONIX) 40 MG tablet Take 1 tablet (40 mg total) by mouth daily. 07/14/18   Skeet Latch, MD  polyethylene glycol [facility] / [name] packet Take 17 g by mouth daily as needed for mild constipation. 04/14/18   Dana Allan I, MD  triamcinolone cream (KENALOG) 0.1 % Apply 1 application topically daily as needed (itching on back).     [provider]  triamcinolone ointment (KENALOG) 0.1 % Apply 1  application topically 2 (two) times daily. Gel on inside of ear    [provider]    Family History Family History  Problem Relation Age of Onset  . Arthritis Mother   . Hypertension Mother   . Heart failure Mother   . Emphysema Mother   . Arthritis Maternal Grandmother   . Hypertension Maternal Grandmother   . Stroke Maternal Grandfather   . Cancer Sister        unknown type cancer  . Heart failure Sister   . Lung disease Sister   . Heart failure Sister   . Colon cancer Neg Hx   . Esophageal cancer Neg Hx   . Stomach cancer Neg Hx   . Rectal cancer Neg Hx     Social History Social History   Tobacco Use  . Smoking status: Current Every Day Smoker    Packs/day: 0.50    Years: 39.00    Pack years: 19.50  Types: Cigarettes  . Smokeless tobacco: Never Used  . Tobacco comment: 1 pack last 3 days  Substance Use Topics  . Alcohol use: No    Alcohol/week: 0.0 standard drinks  . Drug use: Not Currently    Types: Marijuana     Allergies   Aspirin, Allopurinol, Amlodipine, Lisinopril, and Other   Review of Systems Review of Systems  Constitutional:       Per HPI, otherwise negative  HENT:       Per HPI, otherwise negative  Respiratory:       Per HPI, otherwise negative  Cardiovascular:       Per HPI, otherwise negative  Gastrointestinal: Negative for vomiting.  Endocrine:       Negative aside from HPI  Genitourinary:       Neg aside from HPI   Musculoskeletal:       Per HPI, otherwise negative  Skin: Negative.   Neurological: Positive for weakness. Negative for syncope.     Physical Exam Updated Vital Signs BP (!) 134/101   Pulse 63   Temp 99 F (37.2 C) (Oral)   Resp 17   Ht 5\' 8"  (1.727 m)   Wt 118.8 kg   SpO2 98%   BMI 39.84 kg/m   Physical Exam Vitals signs and nursing note reviewed.  Constitutional:      General: She is not in acute distress.    Appearance: She is well-developed.  HENT:     Head: Normocephalic and  atraumatic.  Eyes:     Conjunctiva/sclera: Conjunctivae normal.  Cardiovascular:     Rate and Rhythm: Normal rate and regular rhythm.  Pulmonary:     Effort: Pulmonary effort is normal. No respiratory distress.     Breath sounds: Normal breath sounds. No stridor. No wheezing.  Abdominal:     General: There is no distension.  Musculoskeletal:     Right lower leg: Edema present.     Left lower leg: Edema present.  Skin:    General: Skin is warm and dry.  Neurological:     Mental Status: She is alert and oriented to person, place, and time.     Cranial Nerves: No cranial nerve deficit.      ED Treatments / Results  Labs (all labs ordered are listed, but only abnormal results are displayed) Labs Reviewed  COMPREHENSIVE METABOLIC PANEL - Abnormal; Notable for the following components:      Result Value   Potassium 6.1 (*)    Glucose, Bld 128 (*)    BUN 37 (*)    Creatinine, Ser 2.83 (*)    Calcium 8.7 (*)    Total Protein 6.3 (*)    Albumin 2.9 (*)    AST 238 (*)    ALT 227 (*)    GFR calc non Af Amer 17 (*)    GFR calc Af Amer 19 (*)    All other components within normal limits  CBC WITH DIFFERENTIAL/PLATELET - Abnormal; Notable for the following components:   RBC 2.96 (*)    Hemoglobin 7.9 (*)    HCT 25.8 (*)    RDW 16.4 (*)    All other components within normal limits  BRAIN NATRIURETIC PEPTIDE - Abnormal; Notable for the following components:   B Natriuretic Peptide 2,250.1 (*)    All other components within normal limits  URINALYSIS, ROUTINE W REFLEX MICROSCOPIC - Abnormal; Notable for the following components:   Glucose, UA 50 (*)    Protein, ur >=300 (*)  Bacteria, UA RARE (*)    All other components within normal limits  SARS CORONAVIRUS 2 (TAT 6-24 HRS)    EKG EKG Interpretation  Date/Time:  Friday February 13 2019 11:07:57 EST Ventricular Rate:  75 PR Interval:    QRS Duration: 83 QT Interval:  423 QTC Calculation: 473 R Axis:   43 Text  Interpretation: Sinus rhythm Short PR interval Artifact Otherwise within normal limits Confirmed by Carmin Muskrat [phone]) on 02/13/2019 11:12:22 AM   Radiology Dg Chest Port 1 View  Result Date: 02/13/2019 CLINICAL DATA:  68 year old presenting with acute onset of shortness of breath that began last night, associated with a productive cough. Patient states a 15 pound weight gain over the last 2 days. Current smoker with current history of CHF. EXAM: PORTABLE CHEST 1 VIEW COMPARISON:  12/20/2018 and earlier. FINDINGS: Cardiac silhouette markedly enlarged, unchanged. Pulmonary vascularity normal without evidence of pulmonary edema currently. Lungs clear. Possible small BILATERAL pleural effusions. IMPRESSION: 1. Stable marked cardiomegaly without pulmonary edema. 2. Possible small BILATERAL pleural effusions. No acute cardiopulmonary disease otherwise. Electronically Signed   By: Evangeline Dakin M.D.   On: 02/13/2019 10:53    Procedures Procedures (including critical care time)  Medications Ordered in ED Medications  albuterol (VENTOLIN HFA) 108 (90 Base) MCG/ACT inhaler 2 puff (2 puffs Inhalation Given 02/13/19 1309)  furosemide (LASIX) injection 60 mg (60 mg Intravenous Given 02/13/19 1309)  sodium zirconium cyclosilicate (LOKELMA) packet 10 g (10 g Oral Given 02/13/19 1309)     Initial Impression / Assessment and Plan / ED Course  I have reviewed the triage vital signs and the nursing notes.  Pertinent labs & imaging results that were available during my care of the patient were reviewed by me and considered in my medical decision making (see chart for details).        1:16 PM Patient now requiring 2 L nasal cannula, though hypoxia may be related to sleeping. Labs discussed with the patient at length, including hyperkalemia, worsening renal function and substantially elevated BNP. X-ray also consistent with heart failure exacerbation, and given her description of 15 pound weight  gain over the past few days, patient will receive IV Lasix. With concern for worsening heart failure in the setting of decreased renal function, complicated by hyperkalemia, patient has also received albuterol, Lokelma to facilitate potassium exchange. Given this, she, the patient will require admission for further monitoring, management. Covid test pending.  Final Clinical Impressions(s) / ED Diagnoses   Final diagnoses:  Acute on chronic diastolic congestive heart failure ([place])  Hyperkalemia     Carmin Muskrat, MD 02/13/19 1318

## 2019-02-14 ENCOUNTER — Inpatient Hospital Stay (HOSPITAL_COMMUNITY): Payer: PPO

## 2019-02-14 ENCOUNTER — Telehealth: Payer: Self-pay | Admitting: Physician Assistant

## 2019-02-14 DIAGNOSIS — N184 Chronic kidney disease, stage 4 (severe): Secondary | ICD-10-CM

## 2019-02-14 DIAGNOSIS — I5043 Acute on chronic combined systolic (congestive) and diastolic (congestive) heart failure: Secondary | ICD-10-CM

## 2019-02-14 DIAGNOSIS — R079 Chest pain, unspecified: Secondary | ICD-10-CM

## 2019-02-14 DIAGNOSIS — N170 Acute kidney failure with tubular necrosis: Secondary | ICD-10-CM

## 2019-02-14 LAB — CREATININE, URINE, RANDOM: Creatinine, Urine: 24.52 mg/dL

## 2019-02-14 LAB — BASIC METABOLIC PANEL
Anion gap: 9 (ref 5–15)
BUN: 37 mg/dL — ABNORMAL HIGH (ref 8–23)
CO2: 26 mmol/L (ref 22–32)
Calcium: 8.4 mg/dL — ABNORMAL LOW (ref 8.9–10.3)
Chloride: 106 mmol/L (ref 98–111)
Creatinine, Ser: 2.73 mg/dL — ABNORMAL HIGH (ref 0.44–1.00)
GFR calc Af Amer: 20 mL/min — ABNORMAL LOW (ref >60)
GFR calc non Af Amer: 17 mL/min — ABNORMAL LOW (ref >60)
Glucose, Bld: 111 mg/dL — ABNORMAL HIGH (ref 70–99)
Potassium: 5.4 mmol/L — ABNORMAL HIGH (ref 3.5–5.1)
Sodium: 141 mmol/L (ref 135–145)

## 2019-02-14 LAB — GLUCOSE, CAPILLARY
Glucose-Capillary: 106 mg/dL — ABNORMAL HIGH (ref 70–99)
Glucose-Capillary: 128 mg/dL — ABNORMAL HIGH (ref 70–99)
Glucose-Capillary: 137 mg/dL — ABNORMAL HIGH (ref 70–99)
Glucose-Capillary: 138 mg/dL — ABNORMAL HIGH (ref 70–99)

## 2019-02-14 LAB — SODIUM, URINE, RANDOM: Sodium, Ur: 124 mmol/L

## 2019-02-14 MED ORDER — FUROSEMIDE 10 MG/ML IJ SOLN: 40.0000 mg | Freq: Two times a day (BID) | INTRAMUSCULAR | Status: DC

## 2019-02-14 MED ORDER — ALBUTEROL SULFATE (2.5 MG/3ML) 0.083% IN NEBU
2.5000 mg | INHALATION_SOLUTION | Freq: Four times a day (QID) | RESPIRATORY_TRACT | Status: DC | PRN
  Administered 2019-02-15: 2.5 mg via RESPIRATORY_TRACT
  Filled 2019-02-14: qty 3

## 2019-02-14 MED ORDER — ISOSORBIDE MONONITRATE ER 60 MG PO TB24
90.0000 mg | ORAL_TABLET | Freq: Every day | ORAL | Status: DC
  Administered 2019-02-15 – 2019-02-18 (×4): 90 mg via ORAL
  Filled 2019-02-14 (×4): qty 1

## 2019-02-14 MED ORDER — ASPIRIN EC 81 MG PO TBEC
81.0000 mg | DELAYED_RELEASE_TABLET | Freq: Every day | ORAL | Status: DC
  Administered 2019-02-14 – 2019-02-18 (×5): 81 mg via ORAL
  Filled 2019-02-14 (×5): qty 1

## 2019-02-14 MED ORDER — METOPROLOL SUCCINATE ER 50 MG PO TB24
50.0000 mg | ORAL_TABLET | Freq: Every day | ORAL | Status: DC
  Administered 2019-02-15 – 2019-02-18 (×4): 50 mg via ORAL
  Filled 2019-02-14 (×4): qty 1

## 2019-02-14 MED ORDER — CYCLOBENZAPRINE HCL 5 MG PO TABS
5.0000 mg | ORAL_TABLET | Freq: Every day | ORAL | Status: DC
  Administered 2019-02-14 – 2019-02-18 (×5): 5 mg via ORAL
  Filled 2019-02-14 (×6): qty 1

## 2019-02-14 MED ORDER — GUAIFENESIN-DM 100-10 MG/5ML PO SYRP
5.0000 mL | ORAL_SOLUTION | ORAL | Status: DC | PRN
  Administered 2019-02-14: 03:00:00 5 mL via ORAL
  Filled 2019-02-14: qty 5

## 2019-02-14 MED ORDER — GABAPENTIN 300 MG PO CAPS
300.0000 mg | ORAL_CAPSULE | Freq: Two times a day (BID) | ORAL | Status: DC
  Administered 2019-02-14 – 2019-02-18 (×9): 300 mg via ORAL
  Filled 2019-02-14 (×9): qty 1

## 2019-02-14 MED ORDER — FUROSEMIDE 10 MG/ML IJ SOLN
80.0000 mg | Freq: Three times a day (TID) | INTRAMUSCULAR | Status: DC
  Administered 2019-02-14 – 2019-02-15 (×2): 80 mg via INTRAVENOUS
  Filled 2019-02-14 (×3): qty 8

## 2019-02-14 NOTE — Telephone Encounter (Signed)
Patient called the answering service because she is having chest pain.  She was admitted to the hospital 11/27 for CHF exacerbation.  She has been moderately hypertensive with a systolic blood pressure 185-501 since admission.  She is still very short of breath.  She states that she has been having chest pain and it has been continuous since admission.  She states that she told the doctors, but it is not mentioned in the notes.  I reviewed her labs and she has had a minimal elevation in her HS troponin, more consistent with CHF than ACS.  Her ECG was also reviewed and showed sinus rhythm with no significant abnormalities.  I advised Ms. Detjen that she should call her nurse and let her nurse know that she is having pain.  She should also let the internal medicine doctors know about her chest pain when they round on her.  Ms. Lei states that she will do so.  Rosaria Ferries, PA-C 02/14/2019 1:02 PM [place] [phone]

## 2019-02-14 NOTE — Discharge Instructions (Signed)

## 2019-02-14 NOTE — Progress Notes (Addendum)
Nutrition Education Note  RD working remotely.  RD consulted for nutrition education regarding CHF and diabetes.  Lab Results  Component Value Date   HGBA1C 6.2 (H) 12/20/2018   PTA DM medications are qo mg glimepiride q AM.   Labs reviewed: CBGS: 106-167 (inpatient orders for glycemic control are 0-16 units insulin aspart TID with meals and 0-5 units insulin aspart q HS).   Attempted to speak with pt via phone call, however, no answer. Per RN notes, pt complains of fatigue and desires to sleep at this time.  RD provided "Heart Healthy, Consistent Carbohydrate Nutrition Therapy" handout from the Academy of Nutrition and Dietetics. Handout attached to AVS/ discharge instructions.   Body mass index is 38.92 kg/m. Pt meets criteria for obesity, class II based on current BMI.  Current diet order is renal/ carb modified with 1.2 L fluid restricton, patient is consuming approximately 80% of meals at this time. Labs and medications reviewed. No further nutrition interventions warranted at this time. RD contact information provided. If additional nutrition issues arise, please re-consult RD.   Ajeenah Heiny A. Jimmye Norman, RD, LDN, [place] Registered Dietitian II Certified Diabetes Care and Education Specialist Pager: [phone] After hours Pager: [phone]

## 2019-02-14 NOTE — Progress Notes (Signed)
OT Cancellation Note  Patient Details Name: Zoe Clarke MRN: [id number] DOB: Jun 28, 1950   Cancelled Treatment:    Reason Eval/Treat Not Completed: Fatigue/lethargy limiting ability to participate; pt declined reporting "I just got a nerve pill and I'm too drowsy". Will follow and see as able.   Delight Stare, OT Acute Rehabilitation Services Pager [phone] Office [phone]    Delight Stare 02/14/2019, 3:10 PM

## 2019-02-14 NOTE — Progress Notes (Signed)
PT Cancellation Note  Patient Details Name: Zoe Clarke MRN: [id number] DOB: 1950/12/17   Cancelled Treatment:    Reason Eval/Treat Not Completed: Patient declined, no reason specified   Shary Decamp [facility] 02/14/2019, 2:23 PM [place] Pager [phone] Office [phone]

## 2019-02-14 NOTE — TOC Initial Note (Addendum)
Transition of Care [facility]) - Initial/Assessment Note    Patient Details  Name: Zoe Clarke MRN: [id number] Date of Birth: 1951-01-21  Transition [facility]) CM/SW Contact:    Carles Collet, RN Phone Number: 02/14/2019, 9:41 AM  Clinical Narrative:           PTA independent from home with husband.  Pt has all needed equipment in the home - pt is in the process with PCP to obtain motorized wheelchair, has had outpatient assessment at [facility] for this. Per records, patient was discharged 10/5 with [facility] for [facility], PT. [place] at [facility], who will confirm if patient is still active.  Pt uses [facility] for transportation to appts, and required taxi voucher for DC with last admission.  Confirmed patient is active w [facility].    Expected Discharge Plan: [place] Barriers to Discharge: Continued Medical Work up   Patient Goals and CMS Choice        Expected Discharge Plan and Services Expected Discharge Plan: [place]   Discharge Planning Services: CM Consult                                          Prior Living Arrangements/Services   Lives with:: Spouse                   Activities of Daily Living Home Assistive Devices/Equipment: Environmental consultant (specify type) ADL Screening (condition at time of admission) Patient's cognitive ability adequate to safely complete daily activities?: Yes Is the patient deaf or have difficulty hearing?: No Does the patient have difficulty seeing, even when wearing glasses/contacts?: No Does the patient have difficulty concentrating, remembering, or making decisions?: No Patient able to express need for assistance with ADLs?: Yes Does the patient have difficulty dressing or bathing?: Yes Independently performs ADLs?: No Communication: Independent Dressing (OT): Needs assistance Is this a change from baseline?: Pre-admission baseline Grooming: Needs assistance Is this a change from baseline?:  Pre-admission baseline Feeding: Independent Bathing: Needs assistance Is this a change from baseline?: Pre-admission baseline Toileting: Needs assistance Is this a change from baseline?: Pre-admission baseline In/Out Bed: Needs assistance Is this a change from baseline?: Pre-admission baseline Walks in Home: Needs assistance Is this a change from baseline?: Pre-admission baseline Does the patient have difficulty walking or climbing stairs?: Yes Weakness of Legs: Both Weakness of Arms/Hands: Both  Permission Sought/Granted                  Emotional Assessment              Admission diagnosis:  Hyperkalemia [E87.5] Elevated liver enzymes [R74.8] Acute on chronic diastolic congestive heart failure ([place]) [I50.33] Patient Active Problem List   Diagnosis Date Noted  . Acute on chronic congestive heart failure ([place]) 02/13/2019  . Hyperkalemia 02/13/2019  . Elevated liver enzymes 02/13/2019  . Atelectasis 12/22/2018  . CAP (community acquired pneumonia) 12/20/2018  . Obesity, Class III, BMI 40-49.9 (morbid obesity) ([place]) 12/20/2018  . Marijuana abuse 12/20/2018  . Dysuria 12/20/2018  . Chronic diastolic CHF (congestive heart failure) ([place]) 11/06/2018  . 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 Thyroid nodule 09/22/2018  . Anemia, chronic renal failure, stage 3 (moderate) 09/22/2018  . Sleep apnea 09/22/2018  .  Precordial chest pain   . Cholelithiasis 07/30/2018  . Acute on chronic combined systolic (congestive) and diastolic (congestive) heart failure ([place]) 07/29/2018  . Dyspnea 07/29/2018  . Tobacco abuse counseling 04/27/2018  . Acute pyelonephritis 04/10/2018  . Hypertensive urgency 04/10/2018  . Leukocytosis 09/10/2017  . Anemia of chronic disease 09/10/2017  . Knee pain, bilateral 01/13/2015  . Bilateral hip pain 11/23/2014  . Diabetes  mellitus ([place]) 10/25/2014  . Intertrigo 10/25/2014  . GERD (gastroesophageal reflux disease) 10/25/2014  . Diabetic neuropathy ([place]) 10/25/2014  . Chronic low back pain 10/25/2014  . Tobacco abuse 11/11/2013  . CKD (chronic kidney disease), stage III 09/02/2013  . Gout 12/05/2012   PCP:  Antony Contras, MD Pharmacy:   [facility] [address] [place] [address] Phone: [phone] Fax: [phone]     Social Determinants of Health (SDOH) Interventions    Readmission Risk Interventions Readmission Risk Prevention Plan 12/22/2018 11/10/2018 09/30/2018  Transportation Screening Complete Complete -  HRI or [place] - Complete Complete  Social Work Consult for [place] Planning/Counseling - Complete -  Palliative Care Screening - Not Applicable Not Applicable  Medication Review Press photographer) Complete Complete -  PCP or Specialist appointment within 3-5 days of discharge Complete - -  [place] or Home Care Consult Complete - -  Palliative Care Screening Not Applicable - -  [place] Not Applicable - -  Some recent data might be hidden

## 2019-02-14 NOTE — Progress Notes (Signed)
PROGRESS NOTE  Zoe Clarke CWC:[id number] DOB: 05/25/1950 DOA: 02/13/2019 PCP: Antony Contras, MD  Brief History   Zoe Clarke is a 68 y.o. female with medical history significant of CKD stage IIIb, hypertension, cholelithiasis, morbid obesity, tobacco abuse, marijuana abuse, chronic diastolic congestive heart failure, anemia of chronic disease presents to emergency department due to worsening shortness of breath and weight gain.  Patient tells me that she gained about 15 pounds in 3 days, has leg swelling, orthopnea, PND and worsening shortness of breath.  She tells me that she is compliant with her home medication, denies high sodium diet, increase water intake, decreased urinary output, nausea, vomiting, diarrhea, cough, congestion, headache, blurry vision, chest pain, palpitation, bowel or sleep changes.  She lives with her husband, smokes few cigarettes per day, uses marijuana and denies alcohol intake.  She uses walker for ambulation.  Patient recently admitted with pneumonia on 12/20/2018 and discharged home on 12/22/2018.  She recently evaluated by gastroenterologist Dr. Ardis Hughs on 02/06/2019 outpatient regarding chronic abdominal pain and was reassured that her pain is likely coming from abdominal pannus.  ED Course: Upon arrival: Patient's blood pressure elevated, on 2 L of oxygen via nasal cannula, worsening kidney function, potassium: 6.1, elevated liver enzymes, BNP elevated at 2250, chest x-ray shows cardiomegaly with no pulmonary edema, UA negative.  COVID-19 pending.  Patient received Lasix and Lokelma in the ED.  The patient has been admitted to a telemetry bed. Nephrology has been consulted due to the elevation of her creatinine to 2.83 from her baseline of 2.0. She is being diuresed with lasix 40 mg IV bid.   Consultants  . Nephrology  Procedures  . None  Antibiotics   Anti-infectives (From admission, onward)   None    .  Subjective  The patient is  resting comfortably. No new complaints.   Objective   Vitals:  Vitals:   02/14/19 0834 02/14/19 1337  BP:  (!) 162/74  Pulse:  76  Resp:  18  Temp:    SpO2: 96% 96%   Exam:  Constitutional:  . The patient is awake, alert, and oriented x 3. No acute distress. Respiratory:  Mildly increased work of breathing, especially with conversation. . No wheezes, rales, or rhonchi . No tactile fremitus Cardiovascular:  . Regular rate and rhythm . No murmurs, ectopy, or gallups. . No lateral PMI. No thrills. Abdomen:  . Abdomen is soft, non-tender, non-distended . No hernias, masses, or organomegaly . Normoactive bowel sounds.  Musculoskeletal:  . No cyanosis or clubbing. Marland Kitchen Positive for 2-3 + edema of lower extremities bilaterally. Skin:  . No rashes, lesions, ulcers . palpation of skin: no induration or nodules Neurologic:  . CN 2-12 intact . Sensation all 4 extremities intact Psychiatric:  . Mental status o Mood, affect appropriate o Orientation to person, place, time  . judgment and insight appear intact  I have personally reviewed the following:   Today's Data  . Vitals, BMP, Troponin Imaging  . CXR  Scheduled Meds: . albuterol  2 puff Inhalation Q6H  . atorvastatin  40 mg Oral Daily  . carvedilol  12.5 mg Oral BID WC  . cyclobenzaprine  5 mg Oral Daily  . furosemide  40 mg Oral BID  . gabapentin  300 mg Oral BID  . heparin  5,000 Units Subcutaneous Q8H  . hydrALAZINE  75 mg Oral TID  . insulin aspart  0-15 Units Subcutaneous TID WC  . insulin aspart  0-5 Units Subcutaneous QHS  .  isosorbide mononitrate  60 mg Oral Daily  . pantoprazole  40 mg Oral Daily  . sodium chloride flush  3 mL Intravenous Q12H   Continuous Infusions: . sodium chloride      Principal Problem:   Acute on chronic congestive heart failure (HCC) Active Problems:   Diabetes mellitus (HCC)   Tobacco abuse   CKD (chronic kidney disease), stage III   Anemia of chronic disease    Cholelithiasis   Marijuana abuse   Hyperkalemia   Elevated liver enzymes   LOS: 1 day   A & P  Acute on chronic congestive heart failure: Echo 02/13/2019 demonstrates EF 40-45% with Grade II diastolic dysfunction. There is diffuse hypokinesis worse in septum. Normal global right ventricular systolic function. Moderately elevated pulmonary artery systolic pressure. Pt has been admitted to a telemetry bed. Consult cardiology for abnormal echocardiogram and decline of EF from 60-65% to 40-45%. Patient presented with weight gain of 15 pounds, leg swelling, worsening exertional shortness of breath. Continue cored, statin, and imdur.   Acute hypoxic respiratory failure: Due to volume overload. Supplemental O2 to maintain saO2 of 92%. Monitor.  Uncontrolled hypertension: Blood pressure remains elevated on the patient's home coreg and hydralazine with lasix IV for diuresis. Volume overload.  Diabetes mellitus: Last A1c 6.2 on 12/20/2018. Well controlled on glipizide as outpatient. Her glucoses will be monitored with FSBS and SSI. Glucoses for the past 24 hours have been 106 - 128.  Cholecystitis: Could be secondary to hepatic congestion in the setting of acute on chronic congestive heart failure. Rt upper quadrant ultrasound demonstrates a thickened edematous gallbladder wall with pericholecystic fluid. There is evidence of cirrhosis of the liver. HIDA scan is recommended. Acute hepatitis panel is negative. Monitor.   AKI on CKD stage IV: GFR trended down from 21-19 and creatinine trended up from 2.29-2.83 in 1 month. Nephrology has been consulted. Monitor creatinine, electrolytes, and volume status.   Hyperkalemia: Will give another dose of lokelma and diurese. Monitor electrolytes and telemetry.   Anemia of chronic disease: Likely due to chronic renal disease. Monitor H&H and transfuse for hemoglobin less than 7.0.    Tobacco/marijuana abuse: Counseled about cessation  GERD: Stable  continue Protonix  I have seen and examined this patient myself. I have spent 35 minutes in her evaluation and care.  DVT prophylaxis: TED/SCD/Heparin Code Status: Full code-confirmed with the patient Family Communication: None present at bedside.  Plan of care discussed with patient in length and she verbalized understanding and agreed with it. Disposition Plan: TBD  Savera Donson, DO [facility] Direct contact: see [email or web address]  7PM-7AM contact night coverage as above 02/14/2019, 2:05 PM  LOS: 1 day

## 2019-02-14 NOTE — Consult Note (Addendum)
Renal Service Consult Note [facility]  Zoe Clarke Shalayne Leach 02/14/2019 [place] Requesting Physician:  DR Karie Kirks  Reason for Consult: Renal failure, CHF exacerbation HPI: The patient is a 68 y.o. year-old w/ hx of CKD 3/4, COPD, HTN, smoker, DM2 w/ neuropathy who was admitted on 11/27 for leg swelling, SOB and PND felt due to CHF exacerbation. Up 15 lbs in 3 days per notes. Started on IV lasix 40 bid and admitted. RUQ [place] today showed cirrhosis and edematous GB. HIDA suggested. Creat up to 2.83, most recently 2.0- 2.5 baseline. Asked to see for renal failure and vol overload.   Pt c/o leg swelling, mild SOB at this time. No CP or fevers.  F/B [facility] Dr Deterding, now Dr Johnney Ou, can't remember time of last visit.     ROS  denies CP  no joint pain   no HA  no blurry vision  no rash  no diarrhea  no nausea/ vomiting  no dysuria  no difficulty voiding  no change in urine color    Past Medical History  Past Medical History:  Diagnosis Date  . Allergy   . Arthritis   . Asthma    weazing at night uses inhalers  . Bronchitis    history of  . Chronic diastolic CHF (congestive heart failure) ([place]) 04/2018  . CKD (chronic kidney disease), stage III   . Diabetes mellitus without complication ([place])   . Gastric ulcer   . GERD (gastroesophageal reflux disease)   . History of hiatal hernia   . Hypertension   . Osteoporosis   . Sleep apnea 09/22/2018   Past Surgical History  Past Surgical History:  Procedure Laterality Date  . DILATION AND CURETTAGE OF UTERUS    . EYE SURGERY Left    removed cataract w/ lens  . TUBAL LIGATION     Family History  Family History  Problem Relation Age of Onset  . Arthritis Mother   . Hypertension Mother   . Heart failure Mother   . Emphysema Mother   . Arthritis Maternal Grandmother   . Hypertension Maternal Grandmother   . Stroke Maternal Grandfather   . Cancer Sister        unknown type cancer  . Heart failure  Sister   . Lung disease Sister   . Heart failure Sister   . Colon cancer Neg Hx   . Esophageal cancer Neg Hx   . Stomach cancer Neg Hx   . Rectal cancer Neg Hx    Social History  reports that she has been smoking cigarettes. She has a 19.50 pack-year smoking history. She has never used smokeless tobacco. She reports previous drug use. Drug: Marijuana. She reports that she does not drink alcohol. Allergies  Allergies  Allergen Reactions  . Aspirin Other (See Comments)    Feels funny   . Allopurinol     Dizzy; hard to ambulate  Couldn't see; sweating  . Amlodipine Other (See Comments)    headache  . Lisinopril Swelling    Face swelling  . Other Itching    Squash   Home medications Prior to Admission medications   Medication Sig Start Date End Date Taking? Authorizing Provider  albuterol (PROVENTIL HFA;VENTOLIN HFA) 108 (90 Base) MCG/ACT inhaler Inhale 2 puffs into the lungs every 6 (six) hours as needed for wheezing or shortness of breath. 04/14/18  Yes Bonnell Public, MD  atorvastatin (LIPITOR) 40 MG tablet Take 40 mg by mouth daily.  Yes [provider]  carvedilol (COREG) 12.5 MG tablet Take 1 tablet (12.5 mg total) by mouth 2 (two) times daily with a meal. 09/30/18  Yes Pokhrel, Laxman, MD  cyclobenzaprine (FLEXERIL) 5 MG tablet Take 5-10 mg by mouth daily.    Yes [provider]  ergocalciferol (VITAMIN D2) 1.25 MG (50000 UT) capsule Take 50,000 Units by mouth every Friday.    Yes [provider]  fexofenadine (ALLEGRA) 180 MG tablet Take 180 mg by mouth daily as needed for allergies.    Yes [provider]  furosemide (LASIX) 40 MG tablet Take 1 tablet (40 mg total) by mouth 2 (two) times daily. 11/11/18 02/16/19 Yes Arrien, Jimmy Picket, MD  gabapentin (NEURONTIN) 300 MG capsule Take 900 mg by mouth 3 (three) times daily.    Yes [provider]  glipiZIDE (GLUCOTROL XL) 10 MG 24 hr tablet Take 10 mg by mouth daily with  breakfast.    Yes [provider]  hydrALAZINE (APRESOLINE) 25 MG tablet Take 3 tablets (75 mg total) by mouth 3 (three) times daily. Patient taking differently: Take 50 mg by mouth 3 (three) times daily.  09/30/18 02/13/19 Yes Pokhrel, Laxman, MD  hydrocortisone cream 1 % Apply topically 2 (two) times daily. Areas of itch 09/30/18  Yes Pokhrel, Laxman, MD  isosorbide mononitrate (IMDUR) 60 MG 24 hr tablet Take 1 tablet (60 mg total) by mouth daily. 10/01/18 02/16/19 Yes Pokhrel, Laxman, MD  linaclotide (LINZESS) 145 MCG CAPS capsule Take 145 mcg by mouth daily as needed (constipation).    Yes [provider]  Multiple Vitamin (MULTIVITAMIN WITH MINERALS) TABS tablet Take 1 tablet by mouth daily.   Yes [provider]  oxyCODONE-acetaminophen (PERCOCET/ROXICET) 5-325 MG tablet Take 0.5 tablets by mouth every 6 (six) hours as needed for moderate pain or severe pain.    Yes [provider]  pantoprazole (PROTONIX) 40 MG tablet Take 1 tablet (40 mg total) by mouth daily. 07/14/18  Yes Skeet Latch, MD  polyethylene glycol [facility] [name] packet Take 17 g by mouth daily as needed for mild constipation. 04/14/18  Yes Dana Allan I, MD  triamcinolone cream (KENALOG) 0.1 % Apply 1 application topically daily as needed (itching on back).    Yes [provider]  triamcinolone ointment (KENALOG) 0.1 % Apply 1 application topically 2 (two) times daily. Gel on inside of ear   Yes [provider]   Liver Function Tests Recent Labs  Lab 02/13/19 1102  AST 238*  ALT 227*  ALKPHOS 119  BILITOT 0.6  PROT 6.3*  ALBUMIN 2.9*   No results for input(s): LIPASE, AMYLASE in the last 168 hours. CBC Recent Labs  Lab 02/13/19 1102  WBC 8.0  NEUTROABS 5.5  HGB 7.9*  HCT 25.8*  MCV 87.2  PLT 170   Basic Metabolic Panel Recent Labs  Lab 02/13/19 1102 02/14/19 0424  NA 143 141  K 6.1* 5.4*  CL 109 106  CO2 25 26  GLUCOSE 128* 111*  BUN  37* 37*  CREATININE 2.83* 2.73*  CALCIUM 8.7* 8.4*   Iron/TIBC/Ferritin/ %Sat    Component Value Date/Time   IRON 38 09/25/2018 0257   TIBC 234 (L) 09/25/2018 0257   FERRITIN 156 09/25/2018 0257   IRONPCTSAT 16 09/25/2018 0257    Vitals:   02/14/19 0749 02/14/19 0834 02/14/19 1337 02/14/19 1609  BP: (!) 165/67  (!) 162/74 (!) 167/83  Pulse: 79  76 89  Resp: 20  18 18  Temp: 98.7 F (37.1 C)   98.4 F (36.9 C)  TempSrc: Oral   Oral  SpO2: 96% 96% 96% 99%  Weight:      Height:        Exam Gen sleepy but Ox 3, no distress No rash, cyanosis or gangrene Sclera anicteric, throat clear  No jvd or bruits Chest clear bilat to bases, no rales RRR no MRG Abd soft ntnd no mass or ascites +bs GU purewick draining clear yellow urine MS no joint effusions or deformity Ext 2-3+ bilat LE edema, no wounds or ulcers Neuro is alert, Ox 3 , nf    Home meds:  - carvedilol 12.5 bid/ furosemide 40 bid/ hydralazine 75 tid  - isosorbide mononitrate 60 qd/ atorvastatin 40 qd  - glipizide 10 qam  - pantoprazole 40 qd/ miralax prn/ linaclotide 145ug qd prn  - gabapentin 900 mg tid/ oxycodone -aceta prn tid  - prn's/ vitamins/ supplements   CT abd 12/20/2018 > normal appearing bilat kidneys w/o hydro   UA 02/13/19 > 300 prot, 0-5 rbc/ wbc   Prior UA showed > 300 protein in Jan , Sept and Oct 2020      Date     Creat  Notes/ eGFR  2009- 10  1.2    2011   1.75  36  2016- 2017  1.81- 1.96 34  Jan- May 2020 1.62- 2.12 October 2018  1.93- 2.15 Nov 2018  2.10- 3.31   Sept 2020  2.28  08 Jan 2019  1.99- 2.64 21- 30   Feb 13, 2019  2.83  19   Feb 14, 2019  2.73  20  Assessment/ Plan: 1. CKD stage IV - f/b [facility], worsening renal failure over the past few years, is now stage IV most likely w/ eGFR around 20 ml/min. +proteinuria c/w diabetic nephropathy. May also be cardiorenal component.  No uremic signs. Needs diuresis for vol overload, recurrent problem looking back in chart. CXR  clear 2. HTN - bp's up, cont meds 3. DM w/ neuropathy 4. Cirrhosis - suggested by [place] this admit 5. RUQ pain - per primary 6. Combined s/d CHF 7. Vol overload - diurese as Ronnie Derby  MD 02/14/2019, 5:33 PM

## 2019-02-14 NOTE — Progress Notes (Signed)
Pt refused to ambulate, and saying that she is feeling very fatigue, and just want to sleep. She is 94% on RA

## 2019-02-14 NOTE — Consult Note (Signed)
Cardiology Consultation:   Patient ID: Zoe Clarke; [id number]; [birth year]   Admit date: 02/13/2019 Date of Consult: 02/14/2019  Primary Care Provider: Antony Contras, MD Primary Cardiologist: Skeet Latch, MD 11/17/2018 televisit K. Purcell Nails, [place] 01/13/2019 Primary Electrophysiologist:  None   Patient Profile:   Zoe Clarke is a 68 y.o. female with a hx of D-CHF, CKD IV, asthma, DM, HTN, OSA, gastric ulcer/HH, morbid obesity, tob use, who is being seen today for the evaluation of chest pain at the request of Dr Benny Lennert.  History of Present Illness:   Zoe Clarke was admitted 10/03-10/07/2018 for PNA & CHF exacerbation, d/c wt 113.8 kg. Pt had an episode of chest pain just prior to d/c, was kept for another day. No elevation in enzymes, ECG no changes. CP felt to be referred RUQ pain from her cholelithiasis. 11/20 office visit w/ Dr Ardis Hughs, abd pain felt to be coming from abdominal pannus.  Pt admitted 11/27 with 2 days increased SOB, reported 15 lb wt gain. Admit wt 118.8 kg. She is being diuresed by IM, has lost 7 lbs even though I/O net negative only 1 L.  Nephrology has been asked to evaluate 2nd increased Cr, up to 2.83. Echo showed decreased EF from 60-65% down to 40-45%.  Cardiology asked to evaluate.  Today, pt c/o chest pain and called the office. Labs and ECG reviewed while pt on the phone and no acute problems, pt asked to let IM MD know.  Zoe Clarke states that her chest has been hurting since Wednesday.  The pain is continuous all day.  She is able to sleep at night, but the pain is there when she wakes up in the morning.  It varies in intensity, up to 8/10.  She has some chest wall tenderness, but denies any change in the pain with deep inspiration.  She also has some right-sided chest pain when she takes a deep breath, she feels this is coming from her gallbladder.  She has been short of breath and wheezing since admission, she is now off oxygen, and  the shortness of breath improved with albuterol inhaler and with IV Lasix.  Of note, she does still have an expiratory wheeze on exam.  Both of her arms are aching.  Her right arm hurts in the forearm area, and her left arm hurts in the triceps area.  She says that all of her symptoms started Wednesday, before that, she was doing okay.   Past Medical History:  Diagnosis Date   Allergy    Arthritis    Asthma    weazing at night uses inhalers   Bronchitis    history of   Chronic diastolic CHF (congestive heart failure) ([place]) 04/2018   CKD (chronic kidney disease), stage III    Diabetes mellitus without complication (HCC)    Gastric ulcer    GERD (gastroesophageal reflux disease)    History of hiatal hernia    Hypertension    Osteoporosis    Sleep apnea 09/22/2018    Past Surgical History:  Procedure Laterality Date   DILATION AND CURETTAGE OF UTERUS     EYE SURGERY Left    removed cataract w/ lens   TUBAL LIGATION       Prior to Admission medications   Medication Sig Start Date End Date Taking? Authorizing Provider  albuterol (PROVENTIL HFA;VENTOLIN HFA) 108 (90 Base) MCG/ACT inhaler Inhale 2 puffs into the lungs every 6 (six) hours as needed for wheezing or shortness of breath. 04/14/18  Yes Bonnell Public, MD  atorvastatin (LIPITOR) 40 MG tablet Take 40 mg by mouth daily.   Yes [provider]  carvedilol (COREG) 12.5 MG tablet Take 1 tablet (12.5 mg total) by mouth 2 (two) times daily with a meal. 09/30/18  Yes Pokhrel, Laxman, MD  cyclobenzaprine (FLEXERIL) 5 MG tablet Take 5-10 mg by mouth daily.    Yes [provider]  ergocalciferol (VITAMIN D2) 1.25 MG (50000 UT) capsule Take 50,000 Units by mouth every Friday.    Yes [provider]  fexofenadine (ALLEGRA) 180 MG tablet Take 180 mg by mouth daily as needed for allergies.    Yes [provider]  furosemide (LASIX) 40 MG tablet Take 1 tablet (40 mg total) by mouth 2  (two) times daily. 11/11/18 02/16/19 Yes Arrien, Jimmy Picket, MD  gabapentin (NEURONTIN) 300 MG capsule Take 900 mg by mouth 3 (three) times daily.    Yes [provider]  glipiZIDE (GLUCOTROL XL) 10 MG 24 hr tablet Take 10 mg by mouth daily with breakfast.    Yes [provider]  hydrALAZINE (APRESOLINE) 25 MG tablet Take 3 tablets (75 mg total) by mouth 3 (three) times daily. Patient taking differently: Take 50 mg by mouth 3 (three) times daily.  09/30/18 02/13/19 Yes Pokhrel, Laxman, MD  hydrocortisone cream 1 % Apply topically 2 (two) times daily. Areas of itch 09/30/18  Yes Pokhrel, Laxman, MD  isosorbide mononitrate (IMDUR) 60 MG 24 hr tablet Take 1 tablet (60 mg total) by mouth daily. 10/01/18 02/16/19 Yes Pokhrel, Laxman, MD  linaclotide (LINZESS) 145 MCG CAPS capsule Take 145 mcg by mouth daily as needed (constipation).    Yes [provider]  Multiple Vitamin (MULTIVITAMIN WITH MINERALS) TABS tablet Take 1 tablet by mouth daily.   Yes [provider]  oxyCODONE-acetaminophen (PERCOCET/ROXICET) 5-325 MG tablet Take 0.5 tablets by mouth every 6 (six) hours as needed for moderate pain or severe pain.    Yes [provider]  pantoprazole (PROTONIX) 40 MG tablet Take 1 tablet (40 mg total) by mouth daily. 07/14/18  Yes Skeet Latch, MD  polyethylene glycol [facility] [name] packet Take 17 g by mouth daily as needed for mild constipation. 04/14/18  Yes Dana Allan I, MD  triamcinolone cream (KENALOG) 0.1 % Apply 1 application topically daily as needed (itching on back).    Yes [provider]  triamcinolone ointment (KENALOG) 0.1 % Apply 1 application topically 2 (two) times daily. Gel on inside of ear   Yes [provider]    Inpatient Medications: Scheduled Meds:  albuterol  2 puff Inhalation Q6H   atorvastatin  40 mg Oral Daily   carvedilol  12.5 mg Oral BID WC   cyclobenzaprine  5 mg Oral Daily   furosemide   40 mg Oral BID   gabapentin  300 mg Oral BID   heparin  5,000 Units Subcutaneous Q8H   hydrALAZINE  75 mg Oral TID   insulin aspart  0-15 Units Subcutaneous TID WC   insulin aspart  0-5 Units Subcutaneous QHS   isosorbide mononitrate  60 mg Oral Daily   pantoprazole  40 mg Oral Daily   sodium chloride flush  3 mL Intravenous Q12H   Continuous Infusions:  sodium chloride     PRN Meds: sodium chloride, acetaminophen, albuterol, guaiFENesin-dextromethorphan, ondansetron (ZOFRAN) IV, oxyCODONE-acetaminophen, polyethylene glycol, sodium chloride flush  Allergies:    Allergies  Allergen Reactions   Aspirin Other (See Comments)    Feels funny  Allopurinol     Dizzy; hard to ambulate  Couldn't see; sweating   Amlodipine Other (See Comments)    headache   Lisinopril Swelling    Face swelling   Other Itching    Squash    Social History:   Social History   Socioeconomic History   Marital status: Married    Spouse name: Not on file   Number of children: 2   Years of education: 12   Highest education level: Not on file  Occupational History   Occupation: Housewife  Scientist, product/process development strain: Not on file   Food insecurity    Worry: Not on file    Inability: Not on file   Transportation needs    Medical: Not on file    Non-medical: Not on file  Tobacco Use   Smoking status: Current Every Day Smoker    Packs/day: 0.50    Years: 39.00    Pack years: 19.50    Types: Cigarettes   Smokeless tobacco: Never Used   Tobacco comment: 1 pack last 3 days  Substance and Sexual Activity   Alcohol use: No    Alcohol/week: 0.0 standard drinks   Drug use: Not Currently    Types: Marijuana   Sexual activity: Not on file  Lifestyle   Physical activity    Days per week: Not on file    Minutes per session: Not on file   Stress: Not on file  Relationships   Social connections    Talks on phone: Not on file    Gets together: Not on  file    Attends religious service: Not on file    Active member of club or organization: Not on file    Attends meetings of clubs or organizations: Not on file    Relationship status: Not on file   Intimate partner violence    Fear of current or ex partner: Not on file    Emotionally abused: Not on file    Physically abused: Not on file    Forced sexual activity: Not on file  Other Topics Concern   Not on file  Social History Narrative   Fun: Race video car games - PS2/3, paint by number, board and card games, puzzle books   Feels safe at home and denies abuse   Disabled - does not work from chronic pain   Children - 1 living children, 3 step children   Grandchildren - 79   Married - lives with husband   Uses [employer] transportation    Family History:   Family History  Problem Relation Age of Onset   Arthritis Mother    Hypertension Mother    Heart failure Mother    Emphysema Mother    Arthritis Maternal Grandmother    Hypertension Maternal Grandmother    Stroke Maternal Grandfather    Cancer Sister        unknown type cancer   Heart failure Sister    Lung disease Sister    Heart failure Sister    Colon cancer Neg Hx    Esophageal cancer Neg Hx    Stomach cancer Neg Hx    Rectal cancer Neg Hx    Family Status:  Family Status  Relation Name Status   Mother  Deceased   Father  Deceased   MGM  Deceased   MGF  Deceased   PGM  Deceased   PGF  Deceased   Sister  (Not Specified)   Sister  (  Not Specified)   Neg Hx  (Not Specified)    ROS:  Please see the history of present illness.  All other ROS reviewed and negative.     Physical Exam/Data:   Vitals:   02/14/19 0349 02/14/19 0749 02/14/19 0834 02/14/19 1337  BP: (!) 158/70 (!) 165/67  (!) 162/74  Pulse: 74 79  76  Resp: 18 20  18   Temp: 98.4 F (36.9 C) 98.7 F (37.1 C)    TempSrc: Oral Oral    SpO2: 91% 96% 96% 96%  Weight: 116.1 kg     Height:        Intake/Output Summary  (Last 24 hours) at 02/14/2019 1457 Last data filed at 02/14/2019 0900 Gross per 24 hour  Intake 600 ml  Output 1450 ml  Net -850 ml   Filed Weights   02/13/19 1033 02/13/19 1536 02/14/19 0349  Weight: 118.8 kg 117.2 kg 116.1 kg   Body mass index is 38.92 kg/m.  General:  Well nourished, obese, female in no acute distress HEENT: normal Lymph: no adenopathy Neck: JVD -9-10 cm Endocrine:  No thryomegaly Vascular: Left carotid bruit versus radiation of murmur; 4/4 extremity pulses 2+  Cardiac:  normal S1, S2; RRR; possible soft murmur Lungs:  +wheeze bilaterally, no rhonchi or rales  Abd: soft, nontender, no hepatomegaly  Ext: trace-1+ edema Musculoskeletal:  No deformities, BUE and BLE strength weak but equal Skin: warm and dry  Neuro:  CNs 2-12 intact, no focal abnormalities noted Psych:  Normal affect   EKG:  The EKG was personally reviewed and demonstrates: 11/28 ECG is sinus rhythm, heart rate 74, no acute ischemic changes Telemetry:  Telemetry was personally reviewed and demonstrates: Sinus rhythm, no significant ectopy   CV studies:   ECHO: 02/13/2019  1. Left ventricular ejection fraction, by visual estimation, is 40 to 45%. The left ventricle has mild to moderately decreased function. There is moderately increased left ventricular hypertrophy.  2. Left ventricular diastolic parameters are consistent with Grade II diastolic dysfunction (pseudonormalization).  3. Diffuse hypokinesis worse in septum.  4. Global right ventricle has normal systolic function.The right ventricular size is normal. No increase in right ventricular wall thickness.  5. Left atrial size was mildly dilated.  6. Right atrial size was normal.  7. Moderate mitral annular calcification.  8. The mitral valve is normal in structure. Mild mitral valve regurgitation.  9. The tricuspid valve is normal in structure. Tricuspid valve regurgitation is mild. 10. Aortic valve regurgitation is mild to  moderate. 11. The aortic valve is tricuspid. Aortic valve regurgitation is mild to moderate. Mild to moderate aortic valve sclerosis/calcification without any evidence of aortic stenosis. 12. The pulmonic valve was grossly normal. Pulmonic valve regurgitation is mild. 13. Moderately elevated pulmonary artery systolic pressure.  ECHO: 03/28/2018  1. The left ventricle has normal systolic function of 54-09%. The cavity size was normal. There is mildly increased left ventricular wall thickness. Echo evidence of impaired diastolic relaxation Elevated mean left atrial pressure.  2. The right ventricle has normal systolic function. The cavity was normal. There is no increase in right ventricular wall thickness.  3. Left atrial size was mildly dilated.  4. The mitral valve is normal in structure. There is mild thickening.  5. The tricuspid valve is normal in structure.  6. The aortic valve is normal in structure. Aortic valve regurgitation is mild by color flow Doppler.  7. The inferior vena cava was dilated in size with >50% respiratory variability.  MYOVIEW:  07/30/2018 Perfusion: Attenuation involving the inferior wall the left ventricle improves a nearly resolves on provided stress images. There is matched attenuation involving the anterior wall the left ventricle without regional wall motion abnormality. No definitive scintigraphic evidence of prior infarction or pharmacologically induced ischemia.  Wall Motion: Left ventricle is severely dilated with global left ventricular hypokinesia.  Left Ventricular Ejection Fraction: 42 %  End diastolic volume 607 ml  End systolic volume 85 ml  IMPRESSION: 1. no definitive scintigraphic evidence of prior infarction pharmacologically induced ischemia. 2. Severe left ventricular dilatation. 3. Global left ventricular hypokinesia. Left ventricular ejection fraction-42%   Laboratory Data:   Chemistry Recent Labs  Lab 02/13/19 1102  02/14/19 0424  NA 143 141  K 6.1* 5.4*  CL 109 106  CO2 25 26  GLUCOSE 128* 111*  BUN 37* 37*  CREATININE 2.83* 2.73*  CALCIUM 8.7* 8.4*  GFRNONAA 17* 17*  GFRAA 19* 20*  ANIONGAP 9 9    Lab Results  Component Value Date   ALT 227 (H) 02/13/2019   AST 238 (H) 02/13/2019   ALKPHOS 119 02/13/2019   BILITOT 0.6 02/13/2019   No results found for: AMYLASE Lab Results  Component Value Date   LIPASE 23 12/20/2018    Hematology Recent Labs  Lab 02/13/19 1102  WBC 8.0  RBC 2.96*  HGB 7.9*  HCT 25.8*  MCV 87.2  MCH 26.7  MCHC 30.6  RDW 16.4*  PLT 184   Hemoglobin  Date Value Ref Range Status  02/13/2019 7.9 (L) 12.0 - 15.0 g/dL Final  12/22/2018 8.2 (L) 12.0 - 15.0 g/dL Final  12/21/2018 7.9 (L) 12.0 - 15.0 g/dL Final  12/20/2018 8.4 (L) 12.0 - 15.0 g/dL Final  09/10/2017 12.2 11.6 - 15.9 g/dL Final   Cardiac Enzymes High Sensitivity Troponin:   Recent Labs  Lab 02/13/19 1609 02/13/19 1736  TROPONINIHS 29* 26*      BNP Recent Labs  Lab 02/13/19 1102  BNP 2,250.1*    TSH:  Lab Results  Component Value Date   TSH 1.872 02/13/2019   Lipids: Lab Results  Component Value Date   CHOL 103 12/20/2018   HDL 35 (L) 12/20/2018   LDLCALC 54 12/20/2018   TRIG 68 12/20/2018   CHOLHDL 2.9 12/20/2018   HgbA1c: Lab Results  Component Value Date   HGBA1C 6.2 (H) 12/20/2018   Magnesium:  Magnesium  Date Value Ref Range Status  02/13/2019 2.4 1.7 - 2.4 mg/dL Final    Comment:    Performed at [facility], [place] [address], [place], [place] [address]     Radiology/Studies:  Dg Chest Port 1 View  Result Date: 02/13/2019 CLINICAL DATA:  68 year old presenting with acute onset of shortness of breath that began last night, associated with a productive cough. Patient states a 15 pound weight gain over the last 2 days. Current smoker with current history of CHF. EXAM: PORTABLE CHEST 1 VIEW COMPARISON:  12/20/2018 and earlier. FINDINGS: Cardiac silhouette  markedly enlarged, unchanged. Pulmonary vascularity normal without evidence of pulmonary edema currently. Lungs clear. Possible small BILATERAL pleural effusions. IMPRESSION: 1. Stable marked cardiomegaly without pulmonary edema. 2. Possible small BILATERAL pleural effusions. No acute cardiopulmonary disease otherwise. Electronically Signed   By: Evangeline Dakin M.D.   On: 02/13/2019 10:53   US Abdomen Limited Ruq  Result Date: 02/13/2019 CLINICAL DATA:  Elevated liver enzymes EXAM: ULTRASOUND ABDOMEN LIMITED RIGHT UPPER QUADRANT COMPARISON:  Jul 29, 2018 FINDINGS: Gallbladder: Sludge is noted in the  gallbladder. No gallstones evident. Gallbladder wall is thickened and edematous appearing with mild pericholecystic fluid. No sonographic Murphy sign noted by sonographer. Common bile duct: Diameter: 4 mm. No intrahepatic or extrahepatic biliary duct dilatation. Liver: No focal lesion identified. Liver has a mildly nodular contour with a somewhat coarsened, increased echogenicity pattern. Portal vein is patent on color Doppler imaging with normal direction of blood flow towards the liver. Other: None. IMPRESSION: 1. Thickened, edematous gallbladder wall with pericholecystic fluid. No gallstones present, but sludge is noted in the gallbladder. Suspect a degree of a calculus cholecystitis. 2. The appearance of the liver is felt to be indicative hepatic cirrhosis. No focal liver lesions are evident. It must be cautioned that the sensitivity of ultrasound for detection of focal liver lesions is diminished in this circumstance. Electronically Signed   By: Lowella Grip III M.D.   On: 02/13/2019 14:44    Assessment and Plan:   1.  Acute on chronic combined systolic and diastolic CHF: -Discuss with MD if she needs additional diuresis -BMP was significantly elevated, and PAS is increased, but effusions are small on chest x-ray and no pulmonary edema is seen -Dry weight is unclear, but she was 116.4 kg when  seen post hospital in the office on 10/27, volume status was stable at that time  2.  Chest pain: -The chest pain has basically been continuous since Wednesday without significant enzyme elevation or ECG changes -However, she has left ventricular dysfunction that is new -Myoview May 2020 showed no scar or ischemia -Creatinine precludes cath or cardiac CT -Would pursue medical management for now -She is intolerant of aspirin, MD advise on starting Plavix -Continue Coreg 12.5 mg twice daily, Lipitor 40 mg a day and Imdur 60 mg a day  3.  Cardiomyopathy -See above discussion regarding possible CAD -Continue current therapy with beta-blocker, hydralazine and nitrates -With decreased renal function, no ACE/ARB/Entresto  4.  Possible acalculous cholecystitis -AST and ALT are significantly abnormal, recheck in a.m. -Ultrasound showed possible cholecystitis, HIDA scan recommended -Per IM, but recommend GI and/or surgical consult -With her new left ventricular dysfunction, she is at increased risk for surgical procedures, but there is no way to mitigate the risk at this time. -If surgery is needed, make sure to continue the beta-blocker in the perioperative period  Otherwise, per IM Principal Problem:   Acute on chronic congestive heart failure (HCC) Active Problems:   Diabetes mellitus ([place])   Tobacco abuse   CKD (chronic kidney disease), stage III   Anemia of chronic disease   Cholelithiasis   Marijuana abuse   Hyperkalemia   Elevated liver enzymes     For questions or updates, please contact [facility] Please consult [email or web address] for contact info under Cardiology/STEMI.   Jonetta Speak, PA-C  02/14/2019 2:57 PM

## 2019-02-15 DIAGNOSIS — I5023 Acute on chronic systolic (congestive) heart failure: Secondary | ICD-10-CM

## 2019-02-15 DIAGNOSIS — I420 Dilated cardiomyopathy: Secondary | ICD-10-CM

## 2019-02-15 DIAGNOSIS — R778 Other specified abnormalities of plasma proteins: Secondary | ICD-10-CM

## 2019-02-15 LAB — CBC WITH DIFFERENTIAL/PLATELET
Abs Immature Granulocytes: 0.02 10*3/uL (ref 0.00–0.07)
Basophils Absolute: 0 10*3/uL (ref 0.0–0.1)
Basophils Relative: 0 %
Eosinophils Absolute: 0.3 10*3/uL (ref 0.0–0.5)
Eosinophils Relative: 4 %
HCT: 26.2 % — ABNORMAL LOW (ref 36.0–46.0)
Hemoglobin: 8.1 g/dL — ABNORMAL LOW (ref 12.0–15.0)
Immature Granulocytes: 0 %
Lymphocytes Relative: 22 %
Lymphs Abs: 1.6 10*3/uL (ref 0.7–4.0)
MCH: 26.5 pg (ref 26.0–34.0)
MCHC: 30.9 g/dL (ref 30.0–36.0)
MCV: 85.6 fL (ref 80.0–100.0)
Monocytes Absolute: 0.6 10*3/uL (ref 0.1–1.0)
Monocytes Relative: 8 %
Neutro Abs: 4.6 10*3/uL (ref 1.7–7.7)
Neutrophils Relative %: 66 %
Platelets: 199 10*3/uL (ref 150–400)
RBC: 3.06 MIL/uL — ABNORMAL LOW (ref 3.87–5.11)
RDW: 16.4 % — ABNORMAL HIGH (ref 11.5–15.5)
WBC: 7.1 10*3/uL (ref 4.0–10.5)
nRBC: 0 % (ref 0.0–0.2)

## 2019-02-15 LAB — BASIC METABOLIC PANEL
Anion gap: 7 (ref 5–15)
BUN: 35 mg/dL — ABNORMAL HIGH (ref 8–23)
CO2: 28 mmol/L (ref 22–32)
Calcium: 8.7 mg/dL — ABNORMAL LOW (ref 8.9–10.3)
Chloride: 106 mmol/L (ref 98–111)
Creatinine, Ser: 2.7 mg/dL — ABNORMAL HIGH (ref 0.44–1.00)
GFR calc Af Amer: 20 mL/min — ABNORMAL LOW (ref >60)
GFR calc non Af Amer: 18 mL/min — ABNORMAL LOW (ref >60)
Glucose, Bld: 131 mg/dL — ABNORMAL HIGH (ref 70–99)
Potassium: 4.9 mmol/L (ref 3.5–5.1)
Sodium: 141 mmol/L (ref 135–145)

## 2019-02-15 LAB — GLUCOSE, CAPILLARY
Glucose-Capillary: 137 mg/dL — ABNORMAL HIGH (ref 70–99)
Glucose-Capillary: 103 mg/dL — ABNORMAL HIGH (ref 70–99)
Glucose-Capillary: 159 mg/dL — ABNORMAL HIGH (ref 70–99)
Glucose-Capillary: 150 mg/dL — ABNORMAL HIGH (ref 70–99)

## 2019-02-15 LAB — CREATININE, URINE, RANDOM: Creatinine, Urine: 21.79 mg/dL

## 2019-02-15 LAB — SODIUM, URINE, RANDOM: Sodium, Ur: 129 mmol/L

## 2019-02-15 MED ORDER — HYDRALAZINE HCL 50 MG PO TABS
100.0000 mg | ORAL_TABLET | Freq: Three times a day (TID) | ORAL | Status: DC
  Administered 2019-02-15 – 2019-02-18 (×9): 100 mg via ORAL
  Filled 2019-02-15 (×9): qty 2

## 2019-02-15 MED ORDER — FUROSEMIDE 10 MG/ML IJ SOLN
100.0000 mg | Freq: Three times a day (TID) | INTRAVENOUS | Status: DC
  Filled 2019-02-15 (×2): qty 10

## 2019-02-15 MED ORDER — OXYCODONE HCL 5 MG PO TABS
5.0000 mg | ORAL_TABLET | Freq: Once | ORAL | Status: AC
  Administered 2019-02-15: 5 mg via ORAL
  Filled 2019-02-15: qty 1

## 2019-02-15 MED ORDER — FUROSEMIDE 10 MG/ML IJ SOLN
80.0000 mg | Freq: Three times a day (TID) | INTRAMUSCULAR | Status: DC
  Administered 2019-02-15 – 2019-02-17 (×6): 80 mg via INTRAVENOUS
  Filled 2019-02-15 (×6): qty 8

## 2019-02-15 MED ORDER — HYDRALAZINE HCL 25 MG PO TABS
25.0000 mg | ORAL_TABLET | Freq: Once | ORAL | Status: DC
  Filled 2019-02-15: qty 1

## 2019-02-15 NOTE — Progress Notes (Addendum)
Progress Note  Patient Name: Zoe Clarke Date of Encounter: 02/15/2019  Primary Cardiologist: Skeet Latch, MD   Subjective   SOB has improved.  Complains that her meds are being changed too much.  She put out 2.6L and is net neg 3.1L.    Inpatient Medications    Scheduled Meds:  aspirin EC  81 mg Oral Daily   atorvastatin  40 mg Oral Daily   cyclobenzaprine  5 mg Oral Daily   furosemide  80 mg Intravenous Q8H   gabapentin  300 mg Oral BID   heparin  5,000 Units Subcutaneous Q8H   hydrALAZINE  75 mg Oral TID   insulin aspart  0-15 Units Subcutaneous TID WC   insulin aspart  0-5 Units Subcutaneous QHS   isosorbide mononitrate  90 mg Oral Daily   metoprolol succinate  50 mg Oral Daily   pantoprazole  40 mg Oral Daily   sodium chloride flush  3 mL Intravenous Q12H   Continuous Infusions:  sodium chloride     PRN Meds: sodium chloride, acetaminophen, albuterol, guaiFENesin-dextromethorphan, ondansetron (ZOFRAN) IV, oxyCODONE-acetaminophen, polyethylene glycol, sodium chloride flush   Vital Signs    Vitals:   02/14/19 2032 02/14/19 2336 02/15/19 0503 02/15/19 0722  BP: (!) 179/70 (!) 171/67 (!) 173/63 (!) 171/71  Pulse: 70 72 73 75  Resp: 18 18  20   Temp: 98.9 F (37.2 C) 98.7 F (37.1 C) 98.2 F (36.8 C) 97.7 F (36.5 C)  TempSrc: Oral Oral Oral Oral  SpO2: 96% 98% 96% 97%  Weight:   113.9 kg   Height:        Intake/Output Summary (Last 24 hours) at 02/15/2019 0907 Last data filed at 02/15/2019 0448 Gross per 24 hour  Intake 340 ml  Output 2350 ml  Net -2010 ml   Filed Weights   02/13/19 1536 02/14/19 0349 02/15/19 0503  Weight: 117.2 kg 116.1 kg 113.9 kg    Telemetry    NSR - Personally Reviewed  ECG    NSR with no ST changes - Personally Reviewed  Physical Exam   GEN: No acute distress.   Neck: No JVD Cardiac: RRR, no murmurs, rubs, or gallops.  Respiratory: Clear to auscultation bilaterally. GI: Soft,  nontender, non-distended  MS: 2+ BLE edema; No deformity. Neuro:  Nonfocal  Psych: Normal affect   Labs    Chemistry Recent Labs  Lab 02/13/19 1102 02/14/19 0424 02/15/19 0514  NA 143 141 141  K 6.1* 5.4* 4.9  CL 109 106 106  CO2 25 26 28   GLUCOSE 128* 111* 131*  BUN 37* 37* 35*  CREATININE 2.83* 2.73* 2.70*  CALCIUM 8.7* 8.4* 8.7*  PROT 6.3*  --   --   ALBUMIN 2.9*  --   --   AST 238*  --   --   ALT 227*  --   --   ALKPHOS 119  --   --   BILITOT 0.6  --   --   GFRNONAA 17* 17* 18*  GFRAA 19* 20* 20*  ANIONGAP 9 9 7      Hematology Recent Labs  Lab 02/13/19 1102 02/15/19 0514  WBC 8.0 7.1  RBC 2.96* 3.06*  HGB 7.9* 8.1*  HCT 25.8* 26.2*  MCV 87.2 85.6  MCH 26.7 26.5  MCHC 30.6 30.9  RDW 16.4* 16.4*  PLT 184 199    Cardiac EnzymesNo results for input(s): TROPONINI in the last 168 hours. No results for input(s): TROPIPOC in the last 168 hours.  BNP Recent Labs  Lab 02/13/19 1102  BNP 2,250.1*     DDimer No results for input(s): DDIMER in the last 168 hours.   Radiology    US Renal  Result Date: 02/14/2019 CLINICAL DATA:  68 year old female with chronic kidney disease. EXAM: RENAL / URINARY TRACT ULTRASOUND COMPLETE COMPARISON:  Abdominal ultrasound dated 02/13/2019 and CT abdomen pelvis dated 12/20/2018 FINDINGS: Right Kidney: Renal measurements: 9.4 x 4.3 x 5.1 cm = volume: 107 mL. The right kidney is echogenic and mildly atrophic. No hydronephrosis or shadowing stone. Left Kidney: Renal measurements: 9.8 x 5.1 x 5.5 cm = volume: 144 mL. The left kidney is mildly atrophic and echogenic. No hydronephrosis or shadowing stone. Bladder: Appears normal for degree of bladder distention. Other: None. IMPRESSION: Increased renal echogenicity in keeping with chronic kidney disease. No hydronephrosis or shadowing stone. Electronically Signed   By: Anner Crete M.D.   On: 02/14/2019 19:53   Dg Chest Port 1 View  Result Date: 02/13/2019 CLINICAL DATA:   68 year old presenting with acute onset of shortness of breath that began last night, associated with a productive cough. Patient states a 15 pound weight gain over the last 2 days. Current smoker with current history of CHF. EXAM: PORTABLE CHEST 1 VIEW COMPARISON:  12/20/2018 and earlier. FINDINGS: Cardiac silhouette markedly enlarged, unchanged. Pulmonary vascularity normal without evidence of pulmonary edema currently. Lungs clear. Possible small BILATERAL pleural effusions. IMPRESSION: 1. Stable marked cardiomegaly without pulmonary edema. 2. Possible small BILATERAL pleural effusions. No acute cardiopulmonary disease otherwise. Electronically Signed   By: Evangeline Dakin M.D.   On: 02/13/2019 10:53   US Abdomen Limited Ruq  Result Date: 02/13/2019 CLINICAL DATA:  Elevated liver enzymes EXAM: ULTRASOUND ABDOMEN LIMITED RIGHT UPPER QUADRANT COMPARISON:  Jul 29, 2018 FINDINGS: Gallbladder: Sludge is noted in the gallbladder. No gallstones evident. Gallbladder wall is thickened and edematous appearing with mild pericholecystic fluid. No sonographic Murphy sign noted by sonographer. Common bile duct: Diameter: 4 mm. No intrahepatic or extrahepatic biliary duct dilatation. Liver: No focal lesion identified. Liver has a mildly nodular contour with a somewhat coarsened, increased echogenicity pattern. Portal vein is patent on color Doppler imaging with normal direction of blood flow towards the liver. Other: None. IMPRESSION: 1. Thickened, edematous gallbladder wall with pericholecystic fluid. No gallstones present, but sludge is noted in the gallbladder. Suspect a degree of a calculus cholecystitis. 2. The appearance of the liver is felt to be indicative hepatic cirrhosis. No focal liver lesions are evident. It must be cautioned that the sensitivity of ultrasound for detection of focal liver lesions is diminished in this circumstance. Electronically Signed   By: Lowella Grip III M.D.   On: 02/13/2019 14:44      Cardiac Studies   ECHO: 02/13/2019 1. Left ventricular ejection fraction, by visual estimation, is 40 to 45%. The left ventricle has mild to moderately decreased function. There is moderately increased left ventricular hypertrophy. 2. Left ventricular diastolic parameters are consistent with Grade II diastolic dysfunction (pseudonormalization). 3. Diffuse hypokinesis worse in septum. 4. Global right ventricle has normal systolic function.The right ventricular size is normal. No increase in right ventricular wall thickness. 5. Left atrial size was mildly dilated. 6. Right atrial size was normal. 7. Moderate mitral annular calcification. 8. The mitral valve is normal in structure. Mild mitral valve regurgitation. 9. The tricuspid valve is normal in structure. Tricuspid valve regurgitation is mild. 10. Aortic valve regurgitation is mild to moderate. 11. The aortic valve is tricuspid. Aortic valve  regurgitation is mild to moderate. Mild to moderate aortic valve sclerosis/calcification without any evidence of aortic stenosis. 12. The pulmonic valve was grossly normal. Pulmonic valve regurgitation is mild. 13. Moderately elevated pulmonary artery systolic pressure.  ECHO: 03/28/2018 1. The left ventricle has normal systolic function of 59-56%. The cavity size was normal. There is mildly increased left ventricular wall thickness. Echo evidence of impaired diastolic relaxation Elevated mean left atrial pressure. 2. The right ventricle has normal systolic function. The cavity was normal. There is no increase in right ventricular wall thickness. 3. Left atrial size was mildly dilated. 4. The mitral valve is normal in structure. There is mild thickening. 5. The tricuspid valve is normal in structure. 6. The aortic valve is normal in structure. Aortic valve regurgitation is mild by color flow Doppler. 7. The inferior vena cava was dilated in size with >50% respiratory  variability.  MYOVIEW: 07/30/2018 Perfusion: Attenuation involving the inferior wall the left ventricle improves a nearly resolves on provided stress images. There is matched attenuation involving the anterior wall the left ventricle without regional wall motion abnormality. No definitive scintigraphic evidence of prior infarction or pharmacologically induced ischemia.  Wall Motion: Left ventricle is severely dilated with global left ventricular hypokinesia.  Left Ventricular Ejection Fraction: 42 %  End diastolic volume 387 ml  End systolic volume 85 ml  IMPRESSION: 1. no definitive scintigraphic evidence of prior infarction pharmacologically induced ischemia. 2. Severe left ventricular dilatation. 3. Global left ventricular hypokinesia. Left ventricular ejection fraction-42%  Patient Profile     68 y.o. female with a hx of D-CHF, CKD IV, asthma, DM, HTN, OSA, gastric ulcer/HH, morbid obesity, tob use, who is being seen today for the evaluation of chest pain at the request of Dr Benny Lennert.  Assessment & Plan    1.  Acute on chronic combined systolic and diastolic CHF: -BMP was significantly elevated, and PAS is increased, but effusions are small on chest x-ray and no pulmonary edema is seen -Dry weight is unclear, but she was 116.4 kg when seen post hospital in the office on 10/27, volume status was stable at that time - she put out 2.6L yesterday and is net neg 3.1L - creatinine remains stable at 2.7 (down from 2.83 with diuresis) - continue Lasix 80mg  IV TID - per renal recs - follow renal function closely - continue BB, long acting nitrates and hydralazine - increase Hydralazine to 100mg  TID for better BP control - no ACE/ARB/spiro due to CKD  2.  Chest pain: -The chest pain has basically been continuous since Wednesday without significant enzyme elevation or ECG changes (hsTrop 29>26) -Suspect that her CP is referred pain from her GB.  It is reproducible with  palpation of RUQ - She does give me a hx of prior CP with going up stairs but is very vague in description -Myoview May 2020 showed no scar or ischemia but could have normal scan with balanced ischemia - given her CKD stage 4, she is at high risk of AKI and need for HD if we pursue cath and I do not think the pain she is having now is cardiac and more likely from GB -Would pursue medical management for now - Imdur increased to 90mg  daily -Continue ASA 81mg  daily, Lipitor 40 mg a day, Toprol XL 50mg  daily  3.  Cardiomyopathy -See above discussion regarding possible CAD -Continue current therapy with beta-blocker, hydralazine and nitrates -With decreased renal function, no ACE/ARB/Entresto  4.  Possible acalculous cholecystitis -AST and  ALT are significantly abnormal, recheck today -Ultrasound showed possible cholecystitis, HIDA scan recommended -Per IM, but recommend GI and/or surgical consult -With her new left ventricular dysfunction, she is at increased risk for surgical procedures, but there is no way to mitigate the risk at this time. -If surgery is needed, make sure to continue the beta-blocker and nitrates in the perioperative period - perc drain, if an option over surgery, would have a lower cardiac risk   5.  HTN -BP poorly controlled -increase Hydralazine to 100mg  TID -continue Toprol XL 50mg  daily and Imdur 90mg  daily      For questions or updates, please contact [facility] Please consult [email or web address] for contact info under Cardiology/STEMI.      Signed, Fransico Him, MD  02/15/2019, 9:07 AM

## 2019-02-15 NOTE — Plan of Care (Signed)
  Problem: Education: Goal: Ability to demonstrate management of disease process will improve Outcome: Progressing   Problem: Education: Goal: Ability to verbalize understanding of medication therapies will improve Outcome: Progressing   Problem: Activity: Goal: Capacity to carry out activities will improve Outcome: Progressing   Problem: Activity: Goal: Risk for activity intolerance will decrease Outcome: Progressing

## 2019-02-15 NOTE — Plan of Care (Signed)
  Problem: Cardiac: Goal: Ability to achieve and maintain adequate cardiopulmonary perfusion will improve Outcome: Progressing   Problem: Safety: Goal: Ability to remain free from injury will improve Outcome: Progressing   Problem: Activity: Goal: Capacity to carry out activities will improve Outcome: Not Progressing   Problem: Activity: Goal: Risk for activity intolerance will decrease Outcome: Not Progressing

## 2019-02-15 NOTE — Progress Notes (Signed)
PROGRESS NOTE  Zoe Clarke RWE:[id number] DOB: 06-Mar-1951 DOA: 02/13/2019 PCP: Antony Contras, MD  Brief History   Zoe Clarke is a 68 y.o. female with medical history significant of CKD stage IIIb, hypertension, cholelithiasis, morbid obesity, tobacco abuse, marijuana abuse, chronic diastolic congestive heart failure, anemia of chronic disease presents to emergency department due to worsening shortness of breath and weight gain.  Patient tells me that she gained about 15 pounds in 3 days, has leg swelling, orthopnea, PND and worsening shortness of breath.  She tells me that she is compliant with her home medication, denies high sodium diet, increase water intake, decreased urinary output, nausea, vomiting, diarrhea, cough, congestion, headache, blurry vision, chest pain, palpitation, bowel or sleep changes.  She lives with her husband, smokes few cigarettes per day, uses marijuana and denies alcohol intake.  She uses walker for ambulation.  Patient recently admitted with pneumonia on 12/20/2018 and discharged home on 12/22/2018.  She recently evaluated by gastroenterologist Dr. Ardis Hughs on 02/06/2019 outpatient regarding chronic abdominal pain and was reassured that her pain is likely coming from abdominal pannus.  ED Course: Upon arrival: Patient's blood pressure elevated, on 2 L of oxygen via nasal cannula, worsening kidney function, potassium: 6.1, elevated liver enzymes, BNP elevated at 2250, chest x-ray shows cardiomegaly with no pulmonary edema, UA negative.  COVID-19 pending.  Patient received Lasix and Lokelma in the ED.  The patient has been admitted to a telemetry bed. Nephrology has been consulted due to the elevation of her creatinine to 2.83 from her baseline of 2.0. She is being diuresed with lasix 40 mg IV bid.   Consultants  . Nephrology . Cardiology  Procedures  . None  Antibiotics   Anti-infectives (From admission, onward)   None     Subjective  The  patient is resting comfortably. No new complaints.   Objective   Vitals:  Vitals:   02/15/19 0503 02/15/19 0722  BP: (!) 173/63 (!) 171/71  Pulse: 73 75  Resp:  20  Temp: 98.2 F (36.8 C) 97.7 F (36.5 C)  SpO2: 96% 97%   Exam:  Constitutional:  . The patient is awake, alert, and oriented x 3. No acute distress. Respiratory:  Mildly increased work of breathing, especially with conversation. . No wheezes, rales, or rhonchi . No tactile fremitus Cardiovascular:  . Regular rate and rhythm . No murmurs, ectopy, or gallups. . No lateral PMI. No thrills. Abdomen:  . Abdomen is soft, non-tender, non-distended . No hernias, masses, or organomegaly . Normoactive bowel sounds.  Musculoskeletal:  . No cyanosis or clubbing. Marland Kitchen Positive for 2+ edema of lower extremities bilaterally. Skin:  . No rashes, lesions, ulcers . palpation of skin: no induration or nodules Neurologic:  . CN 2-12 intact . Sensation all 4 extremities intact Psychiatric:  . Mental status o Mood, affect appropriate o Orientation to person, place, time  . judgment and insight appear intact  I have personally reviewed the following:   Today's Data  . Vitals, BMP, CBC Imaging  . CXR  Scheduled Meds: . aspirin EC  81 mg Oral Daily  . atorvastatin  40 mg Oral Daily  . cyclobenzaprine  5 mg Oral Daily  . furosemide  80 mg Intravenous Q8H  . gabapentin  300 mg Oral BID  . heparin  5,000 Units Subcutaneous Q8H  . hydrALAZINE  100 mg Oral TID  . hydrALAZINE  25 mg Oral Once  . insulin aspart  0-15 Units Subcutaneous TID WC  .  insulin aspart  0-5 Units Subcutaneous QHS  . isosorbide mononitrate  90 mg Oral Daily  . metoprolol succinate  50 mg Oral Daily  . pantoprazole  40 mg Oral Daily  . sodium chloride flush  3 mL Intravenous Q12H   Continuous Infusions: . sodium chloride      Principal Problem:   Acute on chronic congestive heart failure (HCC) Active Problems:   Diabetes mellitus (HCC)    Tobacco abuse   CKD (chronic kidney disease), stage III   Anemia of chronic disease   Cholelithiasis   Marijuana abuse   Hyperkalemia   Elevated liver enzymes   LOS: 2 days   A & P  Acute on chronic congestive heart failure: Echo 02/13/2019 demonstrates EF 40-45% with Grade II diastolic dysfunction. There is diffuse hypokinesis worse in septum. Normal global right ventricular systolic function. Moderately elevated pulmonary artery systolic pressure. Pt has been admitted to a telemetry bed. Consult cardiology for abnormal echocardiogram and decline of EF from 60-65% to 40-45%. Patient presented with weight gain of 15 pounds, leg swelling, worsening exertional shortness of breath. Continue coreg, statin, and imdur.   Acute hypoxic respiratory failure: Due to volume overload. The patient is saturating at 97% on 2L O2 by nasal cannula.  Uncontrolled hypertension: Blood pressure remains elevated on the patient's home coreg and hydralazine with lasix IV for diuresis. Hydralazine increased. Volume overload.  Diabetes mellitus: Last A1c 6.2 on 12/20/2018. Well controlled on glipizide as outpatient. Her glucoses will be monitored with FSBS and SSI. Glucoses for the past 24 hours have been 106 - 128.  Cholecystitis: Could be secondary to hepatic congestion in the setting of acute on chronic congestive heart failure. Rt upper quadrant ultrasound demonstrates a thickened edematous gallbladder wall with pericholecystic fluid. There is evidence of cirrhosis of the liver. HIDA scan is ordered for tomorrow am. Acute hepatitis panel is negative. Monitor.   AKI on CKD stage IV: GFR trended down from 21-19 and creatinine trended up from 2.29-2.83 in 1 month. Nephrology has been consulted. Monitor creatinine, electrolytes, and volume status.   Hyperkalemia: Will give another dose of lokelma and diurese. Monitor electrolytes and telemetry.   Anemia of chronic disease: Likely due to chronic renal disease.  Monitor H&H and transfuse for hemoglobin less than 7.0.    Tobacco/marijuana abuse: Counseled about cessation  GERD: Stable continue Protonix  I have seen and examined this patient myself. I have spent 32 minutes in her evaluation and care.  DVT prophylaxis: TED/SCD/Heparin Code Status: Full code-confirmed with the patient Family Communication: None present at bedside.  Plan of care discussed with patient in length and she verbalized understanding and agreed with it. Disposition Plan: TBD  Pasco Marchitto, DO [facility] Direct contact: see [email or web address]  7PM-7AM contact night coverage as above 02/15/2019, 5:21 PM  LOS: 1 day

## 2019-02-15 NOTE — Progress Notes (Signed)
Patient complaining of 10/10 pain that's not relieved with current pain meds. Provider notified.

## 2019-02-15 NOTE — Evaluation (Signed)
Physical Therapy Evaluation Patient Details Name: Zoe Clarke MRN: [id number] DOB: 11/04/1950 Today's Date: 02/15/2019   History of Present Illness  Pt adm with acute on chronic heart failure. Pt with recent adm for PNA. PMH - morbid obesity, chf, dm, htn, ckd, oa, arthritis.   Clinical Impression  Pt admitted with above diagnosis and presents to PT with functional limitations due to deficits listed below (See PT problem list). Pt needs skilled PT to maximize independence and safety to allow discharge to home with husband. Pt not far from baseline.      Follow Up Recommendations Home health PT;Supervision for mobility/OOB    Equipment Recommendations  None recommended by PT    Recommendations for Other Services       Precautions / Restrictions Precautions Precautions: Fall Restrictions Weight Bearing Restrictions: No      Mobility  Bed Mobility Overal bed mobility: Needs Assistance Bed Mobility: Supine to Sit     Supine to sit: Min assist     General bed mobility comments: pt able to manage BLEs to EOB, but requires support to elevate trunk ; increased time and effort  Transfers Overall transfer level: Needs assistance Equipment used: Rolling walker (2 wheeled) Transfers: Sit to/from [facility] Sit to Stand: Min assist;From elevated surface Stand pivot transfers: Min guard       General transfer comment: Assist to bring hips up and for balance. Bed to bsc with walker. Elevated bed to stand.   Ambulation/Gait Ambulation/Gait assistance: Min guard Gait Distance (Feet): 10 Feet Assistive device: Rolling walker (2 wheeled) Gait Pattern/deviations: Step-through pattern;Decreased step length - right;Decreased step length - left;Trunk flexed Gait velocity: decr Gait velocity interpretation: <1.31 ft/sec, indicative of household ambulator General Gait Details: Assist for safety. Pt amb to sink and then back to chair  Stairs             Wheelchair Mobility    Modified Rankin (Stroke Patients Only)       Balance Overall balance assessment: Needs assistance Sitting-balance support: No upper extremity supported;Feet supported Sitting balance-Leahy Scale: Fair     Standing balance support: Bilateral upper extremity supported;During functional activity;No upper extremity supported Standing balance-Leahy Scale: Poor Standing balance comment: BUE support and min guard for static standing                             Pertinent Vitals/Pain Pain Assessment: Faces Faces Pain Scale: Hurts a little bit Pain Location: generalized  Pain Descriptors / Indicators: Discomfort;Sore Pain Intervention(s): Monitored during session;Limited activity within patient's tolerance;Repositioned    Home Living Family/patient expects to be discharged to:: Private residence Living Arrangements: Spouse/significant other Available Help at Discharge: Family;Available 24 hours/day Type of Home: House Home Access: Stairs to enter Entrance Stairs-Rails: Left Entrance Stairs-Number of Steps: 3 Home Layout: One level Home Equipment: Bedside commode;Walker - 2 wheels;Cane - single point;Walker - 4 wheels;Grab bars - tub/shower;Shower seat Additional Comments: Pt is in process of getting a power wc. Pt also prefers to use husbands rollator over her rolling walker    Prior Function Level of Independence: Needs assistance   Gait / Transfers Assistance Needed: walking using RW/rollator, working on getting a power chair   ADL's / Homemaking Assistance Needed: pt reports independent bathing, spouse assist with LB ADLs         Hand Dominance   Dominant Hand: Left    Extremity/Trunk Assessment   Upper Extremity Assessment Upper Extremity Assessment:  Defer to OT evaluation    Lower Extremity Assessment Lower Extremity Assessment: Generalized weakness       Communication   Communication: No difficulties  Cognition  Arousal/Alertness: Awake/alert Behavior During Therapy: WFL for tasks assessed/performed Overall Cognitive Status: Within Functional Limits for tasks assessed                                 General Comments: appears WFL, slow processing noted      General Comments      Exercises     Assessment/Plan    PT Assessment Patient needs continued PT services  PT Problem List Decreased strength;Decreased activity tolerance;Decreased balance;Decreased mobility;Obesity       PT Treatment Interventions DME instruction;Gait training;Functional mobility training;Therapeutic activities;Therapeutic exercise;Balance training;Patient/family education    PT Goals (Current goals can be found in the Care Plan section)  Acute Rehab PT Goals Patient Stated Goal: to feel better PT Goal Formulation: With patient Time For Goal Achievement: 03/01/19 Potential to Achieve Goals: Good    Frequency Min 3X/week   Barriers to discharge        Co-evaluation PT/OT/SLP Co-Evaluation/Treatment: Yes Reason for Co-Treatment: Other (comment)(pt participation, she has refused multiple times) PT goals addressed during session: Mobility/safety with mobility OT goals addressed during session: ADL's and self-care       AM-PAC PT "6 Clicks" Mobility  Outcome Measure Help needed turning from your back to your side while in a flat bed without using bedrails?: A Little Help needed moving from lying on your back to sitting on the side of a flat bed without using bedrails?: A Little Help needed moving to and from a bed to a chair (including a wheelchair)?: A Little Help needed standing up from a chair using your arms (e.g., wheelchair or bedside chair)?: A Little Help needed to walk in hospital room?: A Little Help needed climbing 3-5 steps with a railing? : A Lot 6 Click Score: 17    End of Session Equipment Utilized During Treatment: Gait belt Activity Tolerance: Patient tolerated treatment  well Patient left: in chair;with call bell/phone within reach Nurse Communication: Mobility status PT Visit Diagnosis: Unsteadiness on feet (R26.81);Other abnormalities of gait and mobility (R26.89);Muscle weakness (generalized) (M62.81)    Time: 1610-9604 PT Time Calculation (min) (ACUTE ONLY): 34 min   Charges:   PT Evaluation $PT Eval Moderate Complexity: [place] Pager [phone] Office [place] 02/15/2019, 10:46 AM

## 2019-02-15 NOTE — Progress Notes (Signed)
[facility] Progress Note  Subjective: 2.6 L UOP yest  Vitals:   02/14/19 2032 02/14/19 2336 02/15/19 0503 02/15/19 0722  BP: (!) 179/70 (!) 171/67 (!) 173/63 (!) 171/71  Pulse: 70 72 73 75  Resp: '18 18  20  '$ Temp: 98.9 F (37.2 C) 98.7 F (37.1 C) 98.2 F (36.8 C) 97.7 F (36.5 C)  TempSrc: Oral Oral Oral Oral  SpO2: 96% 98% 96% 97%  Weight:   113.9 kg   Height:        Inpatient medications: . aspirin EC  81 mg Oral Daily  . atorvastatin  40 mg Oral Daily  . cyclobenzaprine  5 mg Oral Daily  . furosemide  80 mg Intravenous Q8H  . gabapentin  300 mg Oral BID  . heparin  5,000 Units Subcutaneous Q8H  . hydrALAZINE  100 mg Oral TID  . hydrALAZINE  25 mg Oral Once  . insulin aspart  0-15 Units Subcutaneous TID WC  . insulin aspart  0-5 Units Subcutaneous QHS  . isosorbide mononitrate  90 mg Oral Daily  . metoprolol succinate  50 mg Oral Daily  . pantoprazole  40 mg Oral Daily  . sodium chloride flush  3 mL Intravenous Q12H   . sodium chloride     sodium chloride, acetaminophen, albuterol, guaiFENesin-dextromethorphan, ondansetron (ZOFRAN) IV, oxyCODONE-acetaminophen, polyethylene glycol, sodium chloride flush    Exam: Gen sleepy but Ox 3, no distress No rash, cyanosis or gangrene Sclera anicteric, throat clear  No jvd or bruits Chest clear bilat to bases, no rales RRR no MRG Abd soft ntnd no mass or ascites +bs GU purewick draining clear yellow urine MS no joint effusions or deformity Ext 2-3+ bilat LE edema, no wounds or ulcers Neuro is alert, Ox 3 , nf    Home meds:  - carvedilol 12.5 bid/ furosemide 40 bid/ hydralazine 75 tid  - isosorbide mononitrate 60 qd/ atorvastatin 40 qd  - glipizide 10 qam  - pantoprazole 40 qd/ miralax prn/ linaclotide 145ug qd prn  - gabapentin 900 mg tid/ oxycodone -aceta prn tid  - prn's/ vitamins/ supplements   CT abd 12/20/2018 > normal appearing bilat kidneys w/o hydro   UA 02/13/19 > 300 prot, 0-5 rbc/  wbc   Prior UA showed > 300 protein in Jan , Sept and Oct 2020      Date                          Creat               Notes/ eGFR  2009- 10                     1.2                     2011                           1.75                 36  2016- 2017                 1.81- 1.96        34  Jan- May 2020           1.62- 2.12 October 2018  1.93- 2.15 Nov 2018                    2.10- 3.31          Sept 2020                   2.28                 08 Jan 2019                    1.99- 2.64        21- 30   Feb 13, 2019             2.83                 19   Feb 14, 2019             2.73                 20  Assessment/ Plan: 1. CKD stage IV - f/b [facility] (Dr Johnney Ou) with worsening renal failure over the past few years, stage IV now w/ eGFR around 20 ml/min. +proteinuria c/w diabetic nephropathy. May also be cardiorenal component.  No uremic signs. admitted for vol overload/ LE edema. Diuresing on IV lasix 80 tid, will continue. Creat stable.  CXR clear 2. HTN - bp's up, cont meds 3. DM w/ neuropathy 4. Cirrhosis - suggested by [place] this admit 5. RUQ pain - per primary 6. Combined s/d CHF 7. Vol overload - diurese as tol     Rob Mionna Advincula 02/15/2019, 12:09 PM  Iron/TIBC/Ferritin/ %Sat    Component Value Date/Time   IRON 38 09/25/2018 0257   TIBC 234 (L) 09/25/2018 0257   FERRITIN 156 09/25/2018 0257   IRONPCTSAT 16 09/25/2018 0257   Recent Labs  Lab 02/13/19 1102  02/15/19 0514  NA 143   < > 141  K 6.1*   < > 4.9  CL 109   < > 106  CO2 25   < > 28  GLUCOSE 128*   < > 131*  BUN 37*   < > 35*  CREATININE 2.83*   < > 2.70*  CALCIUM 8.7*   < > 8.7*  ALBUMIN 2.9*  --   --    < > = values in this interval not displayed.   Recent Labs  Lab 02/13/19 1102  AST 238*  ALT 227*  ALKPHOS 119  BILITOT 0.6  PROT 6.3*   Recent Labs  Lab 02/15/19 0514  WBC 7.1  HGB 8.1*  HCT 26.2*  PLT 199

## 2019-02-15 NOTE — Evaluation (Signed)
Occupational Therapy Evaluation Patient Details Name: Zoe Clarke MRN: [id number] DOB: 1950-05-29 Today's Date: 02/15/2019    History of Present Illness Pt adm with acute on chronic heart failure. Pt with recent adm for PNA. PMH - morbid obesity, chf, dm, htn, ckd, oa, arthritis.    Clinical Impression   PTA patient reports independent with mobility using RW, assist for LB ADLs at baseline but otherwise independent. Admitted for above and limited by problem list below, including decreased activity tolerance, generalized weakness and impaired balance. Patient currently requires min assist for transfers, mobility using RW and min-max assist for ADLs.  She will benefit from further OT services while admitted and after dc at [facility] level in order to optimize independence and return to PLOF. Will follow.     Follow Up Recommendations  Home health OT;Supervision/Assistance - 24 hour    Equipment Recommendations  None recommended by OT    Recommendations for Other Services       Precautions / Restrictions Restrictions Weight Bearing Restrictions: No      Mobility Bed Mobility Overal bed mobility: Needs Assistance Bed Mobility: Supine to Sit     Supine to sit: Min assist     General bed mobility comments: pt able to manage BLEs to EOB, but requires support to elevate trunk ; increased time and effort  Transfers Overall transfer level: Needs assistance Equipment used: Rolling walker (2 wheeled) Transfers: Sit to/from [facility] Sit to Stand: Min assist Stand pivot transfers: Min guard       General transfer comment: min assist to power up and steady, min guard to pivot using RW to 3:1 commode     Balance Overall balance assessment: Needs assistance Sitting-balance support: No upper extremity supported;Feet supported Sitting balance-Leahy Scale: Fair     Standing balance support: Bilateral upper extremity supported;During functional activity;No  upper extremity supported Standing balance-Leahy Scale: Poor Standing balance comment: relaint on BUE support                           ADL either performed or assessed with clinical judgement   ADL Overall ADL's : Needs assistance/impaired     Grooming: Min guard;Standing   Upper Body Bathing: Minimal assistance;Sitting   Lower Body Bathing: Moderate assistance;Sit to/from stand Lower Body Bathing Details (indicate cue type and reason): decreased reach to BLE and assist for buttocks  Upper Body Dressing : Minimal assistance;Sitting   Lower Body Dressing: Maximal assistance;Sit to/from stand Lower Body Dressing Details (indicate cue type and reason): requires assist for socks and clothing mgmt; min assist sit to stand-- spouse assists at baseline  Toilet Transfer: Minimal assistance;Stand-pivot;RW;BSC   Toileting- Clothing Manipulation and Hygiene: Minimal assistance;Sit to/from stand Toileting - Clothing Manipulation Details (indicate cue type and reason): clothing mgmt in standing      Functional mobility during ADLs: Minimal assistance;Rolling walker General ADL Comments: pt limited by weakness, decreased activity tolerance and impaired balance     Vision Patient Visual Report: No change from baseline       Perception     Praxis      Pertinent Vitals/Pain Pain Assessment: Faces Faces Pain Scale: Hurts a little bit Pain Location: generalized  Pain Descriptors / Indicators: Discomfort;Sore Pain Intervention(s): Monitored during session;Limited activity within patient's tolerance;Repositioned     Hand Dominance Left   Extremity/Trunk Assessment Upper Extremity Assessment Upper Extremity Assessment: Generalized weakness   Lower Extremity Assessment Lower Extremity Assessment: Defer to PT evaluation  Communication Communication Communication: No difficulties   Cognition Arousal/Alertness: Awake/alert Behavior During Therapy: WFL for tasks  assessed/performed Overall Cognitive Status: Within Functional Limits for tasks assessed                                 General Comments: appears WFL, slow processing noted   General Comments       Exercises     Shoulder Instructions      Home Living Family/patient expects to be discharged to:: Private residence Living Arrangements: Spouse/significant other Available Help at Discharge: Family;Available 24 hours/day Type of Home: House Home Access: Stairs to enter [employer] of Steps: 3 Entrance Stairs-Rails: Left Home Layout: One level     Bathroom Shower/Tub: Teacher, early years/pre: Standard     Home Equipment: Bedside commode;Walker - 2 wheels;Cane - single point;Walker - 4 wheels;Grab bars - tub/shower;Shower seat   Additional Comments: Pt is in process of getting a power wc. Pt also prefers to use husbands rollator over her rolling walker      Prior Functioning/Environment Level of Independence: Needs assistance  Gait / Transfers Assistance Needed: walking using RW/rollator, working on getting a power chair  ADL's / Homemaking Assistance Needed: pt reports independent bathing, spouse assist with LB ADLs             OT Problem List: Decreased strength;Decreased activity tolerance;Impaired balance (sitting and/or standing);Decreased safety awareness;Decreased knowledge of use of DME or AE      OT Treatment/Interventions: Self-care/ADL training;Energy conservation;DME and/or AE instruction;Therapeutic activities;Patient/family education;Balance training    OT Goals(Current goals can be found in the care plan section) Acute Rehab OT Goals Patient Stated Goal: to feel better OT Goal Formulation: With patient Time For Goal Achievement: 03/01/19 Potential to Achieve Goals: Good  OT Frequency: Min 2X/week   Barriers to D/C:            Co-evaluation PT/OT/SLP Co-Evaluation/Treatment: Yes Reason for Co-Treatment: To  address functional/ADL transfers;Other (comment)(pt participation (has declined several times))   OT goals addressed during session: ADL's and self-care      AM-PAC OT "6 Clicks" Daily Activity     Outcome Measure Help from another person eating meals?: None Help from another person taking care of personal grooming?: A Little Help from another person toileting, which includes using toliet, bedpan, or urinal?: A Little Help from another person bathing (including washing, rinsing, drying)?: A Lot Help from another person to put on and taking off regular upper body clothing?: A Little Help from another person to put on and taking off regular lower body clothing?: A Lot 6 Click Score: 17   End of Session Equipment Utilized During Treatment: Gait belt;Rolling walker Nurse Communication: Mobility status  Activity Tolerance: Patient tolerated treatment well Patient left: in chair;with call bell/phone within reach;with nursing/sitter in room  OT Visit Diagnosis: Other abnormalities of gait and mobility (R26.89);Muscle weakness (generalized) (M62.81)                Time: 3151-7616 OT Time Calculation (min): 33 min Charges:  OT General Charges $OT Visit: 1 Visit OT Evaluation $OT Eval Moderate Complexity: [place], OT Acute Rehabilitation Services Pager [phone] Office [phone]   [place] 02/15/2019, 10:24 AM

## 2019-02-15 NOTE — Progress Notes (Addendum)
Physical Therapy Treatment Patient Details Name: Zoe Clarke MRN: [id number] DOB: [birth year] Today's Date: 02/15/2019    History of Present Illness Pt adm with acute on chronic heart failure. Pt with recent adm for PNA. PMH - morbid obesity, chf, dm, htn, ckd, oa, arthritis.     PT Comments    Pt ready to return to bed and she wasn't sure she could get up from chair. Verbal cues to perform the same way as getting up from any other surface. Slight incr in assist to get up due to height of chair. Continue to recommend home with family.   Follow Up Recommendations  Home health PT;Supervision for mobility/OOB     Equipment Recommendations  None recommended by PT    Recommendations for Other Services       Precautions / Restrictions Precautions Precautions: Fall Restrictions Weight Bearing Restrictions: No    Mobility  Bed Mobility Overal bed mobility:           General bed mobility comments: Pt up in chair  Transfers Overall transfer level: Needs assistance Equipment used: Rolling walker (2 wheeled) Transfers: Sit to/from [facility] Sit to Stand: Mod assist Stand pivot transfers: Min guard       General transfer comment: Assist to bring hips up and for balance. Chair to bed with min assist. Coming to stand pt starts with her feet crossed and then uncrosses them as she stands.  Ambulation/Gait Ambulation/Gait assistance: Min guard Gait Distance (Feet): 2 Feet Assistive device: Rolling walker (2 wheeled) Gait Pattern/deviations: Decreased step length - right;Decreased step length - left;Trunk flexed;Step-to pattern Gait velocity: decr Gait velocity interpretation: <1.31 ft/sec, indicative of household ambulator General Gait Details: Assist for safety. Pt amb from chair to bed   Stairs             Wheelchair Mobility    Modified Rankin (Stroke Patients Only)       Balance Overall balance assessment: Needs  assistance Sitting-balance support: No upper extremity supported;Feet supported Sitting balance-Leahy Scale: Fair     Standing balance support: Bilateral upper extremity supported;During functional activity;No upper extremity supported Standing balance-Leahy Scale: Poor Standing balance comment: BUE support and min guard for static standing                            Cognition Arousal/Alertness: Awake/alert Behavior During Therapy: WFL for tasks assessed/performed Overall Cognitive Status: Within Functional Limits for tasks assessed                                     Exercises      General Comments        Pertinent Vitals/Pain Pain Assessment: Faces Faces Pain Scale: Hurts a little bit Pain Location: generalized  Pain Descriptors / Indicators: Discomfort;Sore Pain Intervention(s): Monitored during session;Limited activity within patient's tolerance;Repositioned    Home Living Family/patient expects to be discharged to:: Private residence Living Arrangements: Spouse/significant other Available Help at Discharge: Family;Available 24 hours/day Type of Home: House Home Access: Stairs to enter Entrance Stairs-Rails: Left Home Layout: One level Home Equipment: Bedside commode;Walker - 2 wheels;Cane - single point;Walker - 4 wheels;Grab bars - tub/shower;Shower seat Additional Comments: Pt is in process of getting a power wc. Pt also prefers to use husbands rollator over her rolling walker    Prior Function Level of Independence: Needs assistance  Gait / Transfers  Assistance Needed: walking using RW/rollator, working on getting a power chair  ADL's / [place] Needed: pt reports independent bathing, spouse assist with LB ADLs      PT Goals (current goals can now be found in the care plan section) Acute Rehab PT Goals Patient Stated Goal: to feel better PT Goal Formulation: With patient Time For Goal Achievement: 03/01/19 Potential to  Achieve Goals: Good Progress towards PT goals: Progressing toward goals    Frequency    Min 3X/week      PT Plan      Co-evaluation PT/OT/SLP Co-Evaluation/Treatment: Yes Reason for Co-Treatment: Other (comment)(pt participation, she has refused multiple times) PT goals addressed during session: Mobility/safety with mobility        AM-PAC PT "6 Clicks" Mobility   Outcome Measure  Help needed turning from your back to your side while in a flat bed without using bedrails?: A Little Help needed moving from lying on your back to sitting on the side of a flat bed without using bedrails?: A Little Help needed moving to and from a bed to a chair (including a wheelchair)?: A Little Help needed standing up from a chair using your arms (e.g., wheelchair or bedside chair)?: A Lot Help needed to walk in hospital room?: A Little Help needed climbing 3-5 steps with a railing? : A Lot 6 Click Score: 16    End of Session Equipment Utilized During Treatment: Gait belt Activity Tolerance: Patient tolerated treatment well Patient left: with call bell/phone within reach;in bed;with nursing/sitter in room(Pt sitting EOB) Nurse Communication: Mobility status PT Visit Diagnosis: Unsteadiness on feet (R26.81);Other abnormalities of gait and mobility (R26.89);Muscle weakness (generalized) (M62.81)     Time: 2924-4628 PT Time Calculation (min) (ACUTE ONLY): 8 min  Charges:  $Therapeutic Activity: 8-22 mins                     [place] Pager [phone] Office [place] 02/15/2019, 1:08 PM

## 2019-02-16 ENCOUNTER — Inpatient Hospital Stay (HOSPITAL_COMMUNITY): Payer: PPO

## 2019-02-16 DIAGNOSIS — I5021 Acute systolic (congestive) heart failure: Secondary | ICD-10-CM

## 2019-02-16 DIAGNOSIS — I5033 Acute on chronic diastolic (congestive) heart failure: Secondary | ICD-10-CM

## 2019-02-16 DIAGNOSIS — R1013 Epigastric pain: Secondary | ICD-10-CM

## 2019-02-16 LAB — CBC WITH DIFFERENTIAL/PLATELET
Abs Immature Granulocytes: 0.03 10*3/uL (ref 0.00–0.07)
Basophils Absolute: 0 10*3/uL (ref 0.0–0.1)
Basophils Relative: 0 %
Eosinophils Absolute: 0.5 10*3/uL (ref 0.0–0.5)
Eosinophils Relative: 6 %
HCT: 27.8 % — ABNORMAL LOW (ref 36.0–46.0)
Hemoglobin: 8.7 g/dL — ABNORMAL LOW (ref 12.0–15.0)
Immature Granulocytes: 0 %
Lymphocytes Relative: 18 %
Lymphs Abs: 1.5 10*3/uL (ref 0.7–4.0)
MCH: 27.1 pg (ref 26.0–34.0)
MCHC: 31.3 g/dL (ref 30.0–36.0)
MCV: 86.6 fL (ref 80.0–100.0)
Monocytes Absolute: 0.6 10*3/uL (ref 0.1–1.0)
Monocytes Relative: 8 %
Neutro Abs: 5.5 10*3/uL (ref 1.7–7.7)
Neutrophils Relative %: 68 %
Platelets: 216 10*3/uL (ref 150–400)
RBC: 3.21 MIL/uL — ABNORMAL LOW (ref 3.87–5.11)
RDW: 16.1 % — ABNORMAL HIGH (ref 11.5–15.5)
WBC: 8.2 10*3/uL (ref 4.0–10.5)
nRBC: 0 % (ref 0.0–0.2)

## 2019-02-16 LAB — COMPREHENSIVE METABOLIC PANEL
ALT: 178 U/L — ABNORMAL HIGH (ref 0–44)
AST: 64 U/L — ABNORMAL HIGH (ref 15–41)
Albumin: 2.7 g/dL — ABNORMAL LOW (ref 3.5–5.0)
Alkaline Phosphatase: 98 U/L (ref 38–126)
Anion gap: 12 (ref 5–15)
BUN: 34 mg/dL — ABNORMAL HIGH (ref 8–23)
CO2: 31 mmol/L (ref 22–32)
Calcium: 8.7 mg/dL — ABNORMAL LOW (ref 8.9–10.3)
Chloride: 97 mmol/L — ABNORMAL LOW (ref 98–111)
Creatinine, Ser: 2.92 mg/dL — ABNORMAL HIGH (ref 0.44–1.00)
GFR calc Af Amer: 19 mL/min — ABNORMAL LOW (ref >60)
GFR calc non Af Amer: 16 mL/min — ABNORMAL LOW (ref >60)
Glucose, Bld: 123 mg/dL — ABNORMAL HIGH (ref 70–99)
Potassium: 4.6 mmol/L (ref 3.5–5.1)
Sodium: 140 mmol/L (ref 135–145)
Total Bilirubin: 0.4 mg/dL (ref 0.3–1.2)
Total Protein: 6.1 g/dL — ABNORMAL LOW (ref 6.5–8.1)

## 2019-02-16 LAB — GLUCOSE, CAPILLARY
Glucose-Capillary: 136 mg/dL — ABNORMAL HIGH (ref 70–99)
Glucose-Capillary: 108 mg/dL — ABNORMAL HIGH (ref 70–99)
Glucose-Capillary: 140 mg/dL — ABNORMAL HIGH (ref 70–99)
Glucose-Capillary: 177 mg/dL — ABNORMAL HIGH (ref 70–99)

## 2019-02-16 MED ORDER — TECHNETIUM TC 99M MEBROFENIN IV KIT
5.0000 | PACK | Freq: Once | INTRAVENOUS | Status: AC | PRN
  Administered 2019-02-16: 5 via INTRAVENOUS

## 2019-02-16 MED ORDER — TRIAMCINOLONE ACETONIDE 0.1 % EX CREA
1.0000 "application " | TOPICAL_CREAM | Freq: Every day | CUTANEOUS | Status: DC | PRN
  Administered 2019-02-16: 1 via TOPICAL
  Filled 2019-02-16 (×2): qty 15

## 2019-02-16 MED ORDER — MORPHINE SULFATE (PF) 4 MG/ML IV SOLN
3.0000 mg | Freq: Once | INTRAVENOUS | Status: AC
  Administered 2019-02-16: 3 mg via INTRAVENOUS

## 2019-02-16 MED ORDER — MORPHINE SULFATE (PF) 4 MG/ML IV SOLN
INTRAVENOUS | Status: AC
  Filled 2019-02-16: qty 1

## 2019-02-16 NOTE — Progress Notes (Signed)
Patient had really bad left arm pain, relieved with one time dose of pain medication, heat pack, and repositioning. Patient rested well after she became more comfortable, but did wake up with a complaint of not being able to breath well. RN gave PRN nebulizer, which helped.  Patient slept with nasal cannula on- O2 at 2.5L

## 2019-02-16 NOTE — Progress Notes (Signed)
PROGRESS NOTE  Zoe Clarke NWG:[id number] DOB: 1950-04-30 DOA: 02/13/2019 PCP: Antony Contras, MD  Brief History   Zoe Clarke is a 68 y.o. female with medical history significant of CKD stage IIIb, hypertension, cholelithiasis, morbid obesity, tobacco abuse, marijuana abuse, chronic diastolic congestive heart failure, anemia of chronic disease presents to emergency department due to worsening shortness of breath and weight gain.  Patient report she gained about 15 pounds in 3 days, has leg swelling, orthopnea, PND and worsening shortness of breath.    She lives with her husband, smokes few cigarettes per day, uses marijuana and denies alcohol intake.  She uses walker for ambulation.  Patient recently admitted with pneumonia on 12/20/2018 and discharged home on 12/22/2018.  She recently evaluated by gastroenterologist Dr. Ardis Hughs on 02/06/2019 outpatient regarding chronic abdominal pain and was reassured that her pain is likely coming from abdominal pannus.  ED Course: Upon arrival: Patient's blood pressure elevated, on 2 L of oxygen via nasal cannula, worsening kidney function, potassium: 6.1, elevated liver enzymes, BNP elevated at 2250, chest x-ray shows cardiomegaly with no pulmonary edema, UA negative.  COVID-19 pending.  Patient received Lasix and Lokelma in the ED.  The patient has been admitted to a telemetry bed. Nephrology has been consulted due to the elevation of her creatinine to 2.83 from her baseline of 2.0. She is being diuresed with lasix 40 mg IV bid.   Consultants  . Nephrology . Cardiology  Procedures  . None  Antibiotics   Anti-infectives (From admission, onward)   None     Subjective   Report SOB. Report lower quadrant abdominal pain at site of injection.   Objective   Vitals:  Vitals:   02/16/19 0537 02/16/19 1257  BP: (!) 164/69 (!) 150/65  Pulse: 79 90  Resp: 16 18  Temp: 98.4 F (36.9 C) 99.5 F (37.5 C)  SpO2: 100% 95%   Exam:  . General; alert, anxious . CV; S 1, S 2 RRR . Lungs; Bilateral crackles.  . Abdomen; distended, no RUQ tenderness.  . Extremity; trace edema  I  Today's Data  . Vitals, BMP, CBC Imaging  . CXR  Scheduled Meds: . aspirin EC  81 mg Oral Daily  . atorvastatin  40 mg Oral Daily  . cyclobenzaprine  5 mg Oral Daily  . furosemide  80 mg Intravenous Q8H  . gabapentin  300 mg Oral BID  . heparin  5,000 Units Subcutaneous Q8H  . hydrALAZINE  100 mg Oral TID  . hydrALAZINE  25 mg Oral Once  . insulin aspart  0-15 Units Subcutaneous TID WC  . insulin aspart  0-5 Units Subcutaneous QHS  . isosorbide mononitrate  90 mg Oral Daily  . metoprolol succinate  50 mg Oral Daily  . morphine      . pantoprazole  40 mg Oral Daily  . sodium chloride flush  3 mL Intravenous Q12H   Continuous Infusions: . sodium chloride      Principal Problem:   Acute on chronic congestive heart failure (HCC) Active Problems:   Diabetes mellitus (HCC)   Tobacco abuse   CKD (chronic kidney disease), stage III   Anemia of chronic disease   Cholelithiasis   Marijuana abuse   Hyperkalemia   Elevated liver enzymes   LOS: 3 days   A & P  1-Acute on chronic congestive heart failure: - Echo 02/13/2019 demonstrates EF 40-45% with Grade II diastolic dysfunction. There is diffuse hypokinesis worse in septum. Normal global right  ventricular systolic function. Moderately elevated pulmonary artery systolic pressure.  - Consult cardiology for abnormal echocardiogram and decline of EF from 60-65% to 40-45%. - Patient presented with weight gain of 15 pounds, leg swelling, worsening exertional shortness of breath. -Continue coreg, statin, and imdur.  -Continue IV lasix, appreciate nephrology assistance.  2-Acute hypoxic respiratory failure: Due to volume overload. The patient is saturating at 97% on 2L O2 by nasal cannula. continue with IV lasix,   Uncontrolled hypertension: Blood pressure remains elevated on the  patient's home coreg and hydralazine with lasix IV for diuresis. Hydralazine increased.  - Diabetes mellitus: Last A1c 6.2 on 12/20/2018. Well controlled on glipizide as outpatient.  SSI.   Abdominal pain; Rule out Cholecystitis: Could be secondary to hepatic congestion in the setting of acute on chronic congestive heart failure. Rt upper quadrant ultrasound demonstrates a thickened edematous gallbladder wall with pericholecystic fluid. There is evidence of cirrhosis of the liver.  -HIDA scan negative for acute cholecystitis, can not rule out chronic cholecystitis.  -Surgery consulted.  -Acute hepatitis panel is negative.   AKI on CKD stage IV: GFR trended down from 21-19 and creatinine trended up from 2.29-2.83 in 1 month. Nephrology has been consulted. Monitor creatinine, electrolytes, and volume status.  lasix per nephrology.   Hyperkalemia: received lokelma.  Resolved.  Anemia of chronic disease: Likely due to chronic renal disease. Monitor H&H and transfuse for hemoglobin less than 7.0.    Tobacco/marijuana abuse: Counseled about cessation  GERD: Stable continue Protonix   DVT prophylaxis: TED/SCD/Heparin Code Status: Full code-confirmed with the patient Family Communication:husband at bedside.  Disposition Plan: TBD  Niel Hummer, MD 02/15/2019, 5:21 PM  LOS: 1 day

## 2019-02-16 NOTE — Consult Note (Signed)
Reason for Consult:chronic cholecystitis Referring Physician: B. Regalado  Zoe Clarke is an 68 y.o. female.  HPI: 68 year old female admitted to the hospital with CHF exacerbation and acute on chronic kidney disease.  She has a history of intermittent right upper quadrant abdominal pain.  This pain is associated with nausea.  She has not noted any exacerbating factors.  She has had ultrasounds in the past and on this admission showing sludge/small stones.  She reports that she saw Dr. Ninfa Linden in our office in January of this year for consideration of cholecystectomy but she could not get cardiac clearance because of problems controlling her blood pressure and with her congestive heart failure.  This admission, she has had some episodes of nausea after eating but is not having any pain in her right upper quadrant.  She underwent a HIDA scan which demonstrated patency of the cystic duct, ruling out acute cholecystitis.  There was some suggestion of chronic cholecystitis.  I was asked to see her for surgical management.  Past Medical History:  Diagnosis Date  . Allergy   . Arthritis   . Asthma    weazing at night uses inhalers  . Bronchitis    history of  . Chronic diastolic CHF (congestive heart failure) ([place]) 04/2018  . CKD (chronic kidney disease), stage III   . Diabetes mellitus without complication ([place])   . Gastric ulcer   . GERD (gastroesophageal reflux disease)   . History of hiatal hernia   . Hypertension   . Osteoporosis   . Sleep apnea 09/22/2018    Past Surgical History:  Procedure Laterality Date  . DILATION AND CURETTAGE OF UTERUS    . EYE SURGERY Left    removed cataract w/ lens  . TUBAL LIGATION      Family History  Problem Relation Age of Onset  . Arthritis Mother   . Hypertension Mother   . Heart failure Mother   . Emphysema Mother   . Arthritis Maternal Grandmother   . Hypertension Maternal Grandmother   . Stroke Maternal Grandfather   . Cancer Sister         unknown type cancer  . Heart failure Sister   . Lung disease Sister   . Heart failure Sister   . Colon cancer Neg Hx   . Esophageal cancer Neg Hx   . Stomach cancer Neg Hx   . Rectal cancer Neg Hx     Social History:  reports that she has been smoking cigarettes. She has a 19.50 pack-year smoking history. She has never used smokeless tobacco. She reports previous drug use. Drug: Marijuana. She reports that she does not drink alcohol.  Allergies:  Allergies  Allergen Reactions  . Aspirin Other (See Comments)    Feels funny   . Allopurinol     Dizzy; hard to ambulate  Couldn't see; sweating  . Amlodipine Other (See Comments)    headache  . Lisinopril Swelling    Face swelling  . Other Itching    Squash    Medications: I have reviewed the patient's current medications.  Results for orders placed or performed during the hospital encounter of 02/13/19 (from the past 48 hour(s))  Sodium, urine, random     Status: None   Collection Time: 02/14/19  7:48 PM  Result Value Ref Range   Sodium, Ur 124 mmol/L    Comment: Performed at [facility], [address], [place], [place] [address]  Creatinine, urine, random  Status: None   Collection Time: 02/14/19  7:48 PM  Result Value Ref Range   Creatinine, Urine 24.52 mg/dL    Comment: Performed at [facility], [place] [address], [place], [place] [address]  Glucose, capillary     Status: Abnormal   Collection Time: 02/14/19  9:16 PM  Result Value Ref Range   Glucose-Capillary 138 (H) 70 - 99 mg/dL  Basic metabolic panel     Status: Abnormal   Collection Time: 02/15/19  5:14 AM  Result Value Ref Range   Sodium 141 135 - 145 mmol/L   Potassium 4.9 3.5 - 5.1 mmol/L   Chloride 106 98 - 111 mmol/L   CO2 28 22 - 32 mmol/L   Glucose, Bld 131 (H) 70 - 99 mg/dL   BUN 35 (H) 8 - 23 mg/dL   Creatinine, Ser 2.70 (H) 0.44 - 1.00 mg/dL   Calcium 8.7 (L) 8.9 - 10.3 mg/dL   GFR calc non Af Amer 18 (L) >60 mL/min   GFR calc  Af Amer 20 (L) >60 mL/min   Anion gap 7 5 - 15    Comment: Performed at [facility], [place] [address], [place], [place] [address]  CBC with Differential/Platelet     Status: Abnormal   Collection Time: 02/15/19  5:14 AM  Result Value Ref Range   WBC 7.1 4.0 - 10.5 K/uL   RBC 3.06 (L) 3.87 - 5.11 MIL/uL   Hemoglobin 8.1 (L) 12.0 - 15.0 g/dL   HCT 26.2 (L) 36.0 - 46.0 %   MCV 85.6 80.0 - 100.0 fL   MCH 26.5 26.0 - 34.0 pg   MCHC 30.9 30.0 - 36.0 g/dL   RDW 16.4 (H) 11.5 - 15.5 %   Platelets 199 150 - 400 K/uL   nRBC 0.0 0.0 - 0.2 %   Neutrophils Relative % 66 %   Neutro Abs 4.6 1.7 - 7.7 K/uL   Lymphocytes Relative 22 %   Lymphs Abs 1.6 0.7 - 4.0 K/uL   Monocytes Relative 8 %   Monocytes Absolute 0.6 0.1 - 1.0 K/uL   Eosinophils Relative 4 %   Eosinophils Absolute 0.3 0.0 - 0.5 K/uL   Basophils Relative 0 %   Basophils Absolute 0.0 0.0 - 0.1 K/uL   Immature Granulocytes 0 %   Abs Immature Granulocytes 0.02 0.00 - 0.07 K/uL    Comment: Performed at [facility] [address], [place], [place] [address]  Glucose, capillary     Status: Abnormal   Collection Time: 02/15/19  6:32 AM  Result Value Ref Range   Glucose-Capillary 137 (H) 70 - 99 mg/dL  Glucose, capillary     Status: Abnormal   Collection Time: 02/15/19 11:04 AM  Result Value Ref Range   Glucose-Capillary 103 (H) 70 - 99 mg/dL   Comment 1 Notify RN    Comment 2 Document in Chart   Glucose, capillary     Status: Abnormal   Collection Time: 02/15/19  4:35 PM  Result Value Ref Range   Glucose-Capillary 159 (H) 70 - 99 mg/dL   Comment 1 Notify RN    Comment 2 Document in Chart   Creatinine, urine, random     Status: None   Collection Time: 02/15/19  8:47 PM  Result Value Ref Range   Creatinine, Urine 21.79 mg/dL    Comment: Performed at [facility], [place] [address] [place], [place] [address]  Sodium, urine, random  Status: None   Collection Time: 02/15/19  8:47 PM  Result Value Ref Range    Sodium, Ur 129 mmol/L    Comment: Performed at [facility] [address] [place], [place] [address]  Glucose, capillary     Status: Abnormal   Collection Time: 02/15/19  9:17 PM  Result Value Ref Range   Glucose-Capillary 150 (H) 70 - 99 mg/dL  CBC with Differential/Platelet     Status: Abnormal   Collection Time: 02/16/19  4:55 AM  Result Value Ref Range   WBC 8.2 4.0 - 10.5 K/uL   RBC 3.21 (L) 3.87 - 5.11 MIL/uL   Hemoglobin 8.7 (L) 12.0 - 15.0 g/dL   HCT 27.8 (L) 36.0 - 46.0 %   MCV 86.6 80.0 - 100.0 fL   MCH 27.1 26.0 - 34.0 pg   MCHC 31.3 30.0 - 36.0 g/dL   RDW 16.1 (H) 11.5 - 15.5 %   Platelets 216 150 - 400 K/uL   nRBC 0.0 0.0 - 0.2 %   Neutrophils Relative % 68 %   Neutro Abs 5.5 1.7 - 7.7 K/uL   Lymphocytes Relative 18 %   Lymphs Abs 1.5 0.7 - 4.0 K/uL   Monocytes Relative 8 %   Monocytes Absolute 0.6 0.1 - 1.0 K/uL   Eosinophils Relative 6 %   Eosinophils Absolute 0.5 0.0 - 0.5 K/uL   Basophils Relative 0 %   Basophils Absolute 0.0 0.0 - 0.1 K/uL   Immature Granulocytes 0 %   Abs Immature Granulocytes 0.03 0.00 - 0.07 K/uL    Comment: Performed at [facility], [address], [place], [place] [address]  Comprehensive metabolic panel     Status: Abnormal   Collection Time: 02/16/19  4:55 AM  Result Value Ref Range   Sodium 140 135 - 145 mmol/L   Potassium 4.6 3.5 - 5.1 mmol/L   Chloride 97 (L) 98 - 111 mmol/L   CO2 31 22 - 32 mmol/L   Glucose, Bld 123 (H) 70 - 99 mg/dL   BUN 34 (H) 8 - 23 mg/dL   Creatinine, Ser 2.92 (H) 0.44 - 1.00 mg/dL   Calcium 8.7 (L) 8.9 - 10.3 mg/dL   Total Protein 6.1 (L) 6.5 - 8.1 g/dL   Albumin 2.7 (L) 3.5 - 5.0 g/dL   AST 64 (H) 15 - 41 U/L   ALT 178 (H) 0 - 44 U/L   Alkaline Phosphatase 98 38 - 126 U/L   Total Bilirubin 0.4 0.3 - 1.2 mg/dL   GFR calc non Af Amer 16 (L) >60 mL/min   GFR calc Af Amer 19 (L) >60 mL/min   Anion gap 12 5 - 15    Comment: Performed at [facility], [place] [address],  [place], [place] [address]  Glucose, capillary     Status: Abnormal   Collection Time: 02/16/19  6:26 AM  Result Value Ref Range   Glucose-Capillary 136 (H) 70 - 99 mg/dL  Glucose, capillary     Status: Abnormal   Collection Time: 02/16/19 12:53 PM  Result Value Ref Range   Glucose-Capillary 108 (H) 70 - 99 mg/dL  Glucose, capillary     Status: Abnormal   Collection Time: 02/16/19  4:11 PM  Result Value Ref Range   Glucose-Capillary 140 (H) 70 - 99 mg/dL    US Renal  Result Date: 02/14/2019 CLINICAL DATA:  68 year old female with chronic kidney disease. EXAM: RENAL / URINARY TRACT ULTRASOUND COMPLETE COMPARISON:  Abdominal ultrasound dated 02/13/2019 and CT abdomen pelvis dated 12/20/2018 FINDINGS: Right Kidney: Renal measurements: 9.4 x 4.3 x 5.1 cm = volume: 107 mL. The right kidney is echogenic and mildly atrophic. No hydronephrosis or shadowing stone. Left Kidney: Renal measurements: 9.8 x 5.1 x 5.5 cm = volume: 144 mL. The left kidney is mildly atrophic and echogenic. No hydronephrosis or shadowing stone. Bladder: Appears normal for degree of bladder distention. Other: None. IMPRESSION: Increased renal echogenicity in keeping with chronic kidney disease. No hydronephrosis or shadowing stone. Electronically Signed   By: Anner Crete M.D.   On: 02/14/2019 19:53   Nm Hepato W/eject Fract  Result Date: 02/16/2019 CLINICAL DATA:  Right upper quadrant pain. Evaluate for cholecystitis EXAM: NUCLEAR MEDICINE HEPATOBILIARY IMAGING TECHNIQUE: Sequential images of the abdomen were obtained out to 60 minutes following intravenous administration of radiopharmaceutical. RADIOPHARMACEUTICALS:  5.1 mCi Tc-15m  Choletec IV COMPARISON:  Abdominal sonogram 02/13/2019 FINDINGS: Prompt uptake and biliary excretion of activity by the liver is seen. Biliary activity passes into small bowel, consistent with patent common bile duct. Imaging was performed for 90 minutes without gallbladder activity. Patient  subsequently received 3 mg of morphine sulfate, IV. After the IV administration of morphine gallbladder activity is visualized, consistent with patency of cystic duct. IMPRESSION: 1. Patent cystic duct without evidence for acute cholecystitis. 2. Delayed imaging of the gallbladder which may reflect chronic cholecystitis. Electronically Signed   By: Kerby Moors M.D.   On: 02/16/2019 15:27    Review of Systems  Constitutional: Negative for chills and fever.  HENT: Negative.   Eyes: Negative.   Respiratory: Positive for shortness of breath.   Cardiovascular: Positive for leg swelling. Negative for chest pain.  Gastrointestinal: Positive for nausea. Negative for abdominal pain, constipation, diarrhea and vomiting.  Genitourinary: Negative.   Musculoskeletal: Negative.   Skin: Negative.   Neurological: Negative.   Endo/Heme/Allergies: Negative.   Psychiatric/Behavioral: Negative.    Blood pressure (!) 150/65, pulse 90, temperature 99.5 F (37.5 C), temperature source Oral, resp. rate 18, height 5\' 8"  (1.727 m), weight 111.7 kg, SpO2 95 %. Physical Exam  Constitutional: She is oriented to person, place, and time. She appears well-developed and well-nourished. No distress.  HENT:  Head: Normocephalic.  Mouth/Throat: Oropharynx is clear and moist.  Eyes: Pupils are equal, round, and reactive to light. EOM are normal.  Neck: Neck supple. No thyromegaly present.  Cardiovascular: Normal rate and regular rhythm.  Respiratory: Effort normal. No respiratory distress. She has no wheezes. She has rales.  Few Rales  GI: Soft. She exhibits no distension. There is no abdominal tenderness. There is no rebound and no guarding.  Small palpable nodule right pannus without evidence of infection, likely a Lovenox injection site  Musculoskeletal:     Comments: Some peripheral edema bilateral lower extremities  Neurological: She is alert and oriented to person, place, and time.  Skin: Skin is warm.   Psychiatric: She has a normal mood and affect.    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  Hopefully she can be improved medically and we will plan to see her back in the office to consider elective scheduling of cholecystectomy.  We will check on her again tomorrow to see how her symptoms are going.  No need for emergent surgery.  Zoe Clarke 02/16/2019, 7:01 PM

## 2019-02-16 NOTE — Progress Notes (Signed)
Patient ID: Zoe Clarke, female   DOB: 27-Jul-1950, 68 y.o.   MRN: [id number] S: No events overnight O:BP (!) 150/65 (BP Location: Left Arm)   Pulse 90   Temp 99.5 F (37.5 C) (Oral)   Resp 18   Ht 5\' 8"  (1.727 m)   Wt 111.7 kg   SpO2 95%   BMI 37.44 kg/m   Intake/Output Summary (Last 24 hours) at 02/16/2019 1731 Last data filed at 02/16/2019 1515 Gross per 24 hour  Intake 480 ml  Output 3950 ml  Net -3470 ml   Intake/Output: I/O last 3 completed shifts: In: 940 [P.O.:940] Out: 6950 [Urine:6950]  Intake/Output this shift:  Total I/O In: 0  Out: 1900 [Urine:1900] Weight change: -2.244 kg Gen: NAD CVS: no rub Resp: cta Abd: +BS, soft, NT Ext: 2+ edema  Recent Labs  Lab 02/13/19 1102 02/14/19 0424 02/15/19 0514 02/16/19 0455  NA 143 141 141 140  K 6.1* 5.4* 4.9 4.6  CL 109 106 106 97*  CO2 25 26 28 31   GLUCOSE 128* 111* 131* 123*  BUN 37* 37* 35* 34*  CREATININE 2.83* 2.73* 2.70* 2.92*  ALBUMIN 2.9*  --   --  2.7*  CALCIUM 8.7* 8.4* 8.7* 8.7*  AST 238*  --   --  64*  ALT 227*  --   --  178*   Liver Function Tests: Recent Labs  Lab 02/13/19 1102 02/16/19 0455  AST 238* 64*  ALT 227* 178*  ALKPHOS 119 98  BILITOT 0.6 0.4  PROT 6.3* 6.1*  ALBUMIN 2.9* 2.7*   No results for input(s): LIPASE, AMYLASE in the last 168 hours. No results for input(s): AMMONIA in the last 168 hours. CBC: Recent Labs  Lab 02/13/19 1102 02/15/19 0514 02/16/19 0455  WBC 8.0 7.1 8.2  NEUTROABS 5.5 4.6 5.5  HGB 7.9* 8.1* 8.7*  HCT 25.8* 26.2* 27.8*  MCV 87.2 85.6 86.6  PLT 184 199 216   Cardiac Enzymes: No results for input(s): CKTOTAL, CKMB, CKMBINDEX, TROPONINI in the last 168 hours. CBG: Recent Labs  Lab 02/15/19 1635 02/15/19 2117 02/16/19 0626 02/16/19 1253 02/16/19 1611  GLUCAP 159* 150* 136* 108* 140*    Iron Studies: No results for input(s): IRON, TIBC, TRANSFERRIN, FERRITIN in the last 72 hours. Studies/Results: US Renal  Result Date:  02/14/2019 CLINICAL DATA:  68 year old female with chronic kidney disease. EXAM: RENAL / URINARY TRACT ULTRASOUND COMPLETE COMPARISON:  Abdominal ultrasound dated 02/13/2019 and CT abdomen pelvis dated 12/20/2018 FINDINGS: Right Kidney: Renal measurements: 9.4 x 4.3 x 5.1 cm = volume: 107 mL. The right kidney is echogenic and mildly atrophic. No hydronephrosis or shadowing stone. Left Kidney: Renal measurements: 9.8 x 5.1 x 5.5 cm = volume: 144 mL. The left kidney is mildly atrophic and echogenic. No hydronephrosis or shadowing stone. Bladder: Appears normal for degree of bladder distention. Other: None. IMPRESSION: Increased renal echogenicity in keeping with chronic kidney disease. No hydronephrosis or shadowing stone. Electronically Signed   By: Anner Crete M.D.   On: 02/14/2019 19:53   Nm Hepato W/eject Fract  Result Date: 02/16/2019 CLINICAL DATA:  Right upper quadrant pain. Evaluate for cholecystitis EXAM: NUCLEAR MEDICINE HEPATOBILIARY IMAGING TECHNIQUE: Sequential images of the abdomen were obtained out to 60 minutes following intravenous administration of radiopharmaceutical. RADIOPHARMACEUTICALS:  5.1 mCi Tc-6m  Choletec IV COMPARISON:  Abdominal sonogram 02/13/2019 FINDINGS: Prompt uptake and biliary excretion of activity by the liver is seen. Biliary activity passes into small bowel, consistent with patent common bile duct.  Imaging was performed for 90 minutes without gallbladder activity. Patient subsequently received 3 mg of morphine sulfate, IV. After the IV administration of morphine gallbladder activity is visualized, consistent with patency of cystic duct. IMPRESSION: 1. Patent cystic duct without evidence for acute cholecystitis. 2. Delayed imaging of the gallbladder which may reflect chronic cholecystitis. Electronically Signed   By: Kerby Moors M.D.   On: 02/16/2019 15:27   . aspirin EC  81 mg Oral Daily  . atorvastatin  40 mg Oral Daily  . cyclobenzaprine  5 mg Oral Daily  .  furosemide  80 mg Intravenous Q8H  . gabapentin  300 mg Oral BID  . heparin  5,000 Units Subcutaneous Q8H  . hydrALAZINE  100 mg Oral TID  . hydrALAZINE  25 mg Oral Once  . insulin aspart  0-15 Units Subcutaneous TID WC  . insulin aspart  0-5 Units Subcutaneous QHS  . isosorbide mononitrate  90 mg Oral Daily  . metoprolol succinate  50 mg Oral Daily  . morphine      . pantoprazole  40 mg Oral Daily  . sodium chloride flush  3 mL Intravenous Q12H    BMET    Component Value Date/Time   NA 140 02/16/2019 0455   NA 142 11/21/2018 0812   K 4.6 02/16/2019 0455   CL 97 (L) 02/16/2019 0455   CO2 31 02/16/2019 0455   GLUCOSE 123 (H) 02/16/2019 0455   BUN 34 (H) 02/16/2019 0455   BUN 29 (H) 11/21/2018 0812   CREATININE 2.92 (H) 02/16/2019 0455   CREATININE 1.81 (H) 08/25/2015 1545   CALCIUM 8.7 (L) 02/16/2019 0455   GFRNONAA 16 (L) 02/16/2019 0455   GFRNONAA 29 (L) 08/25/2015 1545   GFRAA 19 (L) 02/16/2019 0455   GFRAA 34 (L) 08/25/2015 1545   CBC    Component Value Date/Time   WBC 8.2 02/16/2019 0455   RBC 3.21 (L) 02/16/2019 0455   HGB 8.7 (L) 02/16/2019 0455   HGB 12.2 09/10/2017 0942   HCT 27.8 (L) 02/16/2019 0455   HCT 38.7 09/10/2017 0942   PLT 216 02/16/2019 0455   PLT 290 09/10/2017 0942   MCV 86.6 02/16/2019 0455   MCV 81.6 01/13/2015 1506   MCH 27.1 02/16/2019 0455   MCHC 31.3 02/16/2019 0455   RDW 16.1 (H) 02/16/2019 0455   LYMPHSABS 1.5 02/16/2019 0455   MONOABS 0.6 02/16/2019 0455   EOSABS 0.5 02/16/2019 0455   BASOSABS 0.0 02/16/2019 0455     Assessment/Plan:  1. AKI/CKD stage 3-4- in setting of acute CHF exacerbation and likely cardiorenal syndrome.  BUN/Cr stable with diuresis.  No indication for dialysis.  Continue to follow. 2. Acute on chronic CHF- improving with IV lasix 3. Acute hypoxic respiratory failure due to volume overload.  Improving with diuresis.  4. hyperkalemia- resolved with lasix 5. HTN- cont with meds 6. DM per  primary 7. Cirrhosis by [place] 8. Anemia of CKD- will check iron stores and follow.  May require ESA.  Donetta Potts, MD [employer] [phone]

## 2019-02-16 NOTE — Progress Notes (Signed)
Patient crying in room, NT consoling pt. Patient states her brother is at [facility] and she doesn't think he is "going to make it".

## 2019-02-16 NOTE — Progress Notes (Signed)
Progress Note  Patient Name: Zoe Clarke Date of Encounter: 02/16/2019  Primary Cardiologist: Skeet Latch, MD   Subjective   Seen after her HIDA scan. Continues to have intermittent abdominal pain.  Inpatient Medications    Scheduled Meds: . aspirin EC  81 mg Oral Daily  . atorvastatin  40 mg Oral Daily  . cyclobenzaprine  5 mg Oral Daily  . furosemide  80 mg Intravenous Q8H  . gabapentin  300 mg Oral BID  . heparin  5,000 Units Subcutaneous Q8H  . hydrALAZINE  100 mg Oral TID  . hydrALAZINE  25 mg Oral Once  . insulin aspart  0-15 Units Subcutaneous TID WC  . insulin aspart  0-5 Units Subcutaneous QHS  . isosorbide mononitrate  90 mg Oral Daily  . metoprolol succinate  50 mg Oral Daily  . morphine      . pantoprazole  40 mg Oral Daily  . sodium chloride flush  3 mL Intravenous Q12H   Continuous Infusions: . sodium chloride     PRN Meds: sodium chloride, acetaminophen, albuterol, guaiFENesin-dextromethorphan, ondansetron (ZOFRAN) IV, oxyCODONE-acetaminophen, polyethylene glycol, sodium chloride flush, triamcinolone cream   Vital Signs    Vitals:   02/15/19 1737 02/15/19 2119 02/16/19 0537 02/16/19 1257  BP: (!) 160/66 (!) 170/73 (!) 164/69 (!) 150/65  Pulse: (!) 55 80 79 90  Resp:  18 16 18   Temp:  98.6 F (37 C) 98.4 F (36.9 C) 99.5 F (37.5 C)  TempSrc:  Oral Oral Oral  SpO2:  97% 100% 95%  Weight:   111.7 kg   Height:        Intake/Output Summary (Last 24 hours) at 02/16/2019 1809 Last data filed at 02/16/2019 1756 Gross per 24 hour  Intake 2700 ml  Output 3950 ml  Net -1250 ml   Last 3 Weights 02/16/2019 02/15/2019 02/14/2019  Weight (lbs) 246 lb 4.1 oz 251 lb 3.2 oz 255 lb 15.3 oz  Weight (kg) 111.7 kg 113.944 kg 116.1 kg      Telemetry    SR - Personally Reviewed  ECG    NSR - Personally Reviewed  Physical Exam   GEN: No acute distress.   Neck: No JVD visible sitting upright Cardiac: RRR, no murmurs, rubs, or  gallops.  Respiratory: Clear to auscultation bilaterally. GI: Soft, nontender, non-distended  MS: bilateral 2+ LE edema; No deformity. Neuro:  Nonfocal  Psych: Normal affect   Labs    High Sensitivity Troponin:   Recent Labs  Lab 02/13/19 1609 02/13/19 1736  TROPONINIHS 29* 26*      Chemistry Recent Labs  Lab 02/13/19 1102 02/14/19 0424 02/15/19 0514 02/16/19 0455  NA 143 141 141 140  K 6.1* 5.4* 4.9 4.6  CL 109 106 106 97*  CO2 25 26 28 31   GLUCOSE 128* 111* 131* 123*  BUN 37* 37* 35* 34*  CREATININE 2.83* 2.73* 2.70* 2.92*  CALCIUM 8.7* 8.4* 8.7* 8.7*  PROT 6.3*  --   --  6.1*  ALBUMIN 2.9*  --   --  2.7*  AST 238*  --   --  64*  ALT 227*  --   --  178*  ALKPHOS 119  --   --  98  BILITOT 0.6  --   --  0.4  GFRNONAA 17* 17* 18* 16*  GFRAA 19* 20* 20* 19*  ANIONGAP 9 9 7 12      Hematology Recent Labs  Lab 02/13/19 1102 02/15/19 0514 02/16/19 0455  WBC  8.0 7.1 8.2  RBC 2.96* 3.06* 3.21*  HGB 7.9* 8.1* 8.7*  HCT 25.8* 26.2* 27.8*  MCV 87.2 85.6 86.6  MCH 26.7 26.5 27.1  MCHC 30.6 30.9 31.3  RDW 16.4* 16.4* 16.1*  PLT 184 199 216    BNP Recent Labs  Lab 02/13/19 1102  BNP 2,250.1*     DDimer No results for input(s): DDIMER in the last 168 hours.   Radiology    US Renal  Result Date: 02/14/2019 CLINICAL DATA:  68 year old female with chronic kidney disease. EXAM: RENAL / URINARY TRACT ULTRASOUND COMPLETE COMPARISON:  Abdominal ultrasound dated 02/13/2019 and CT abdomen pelvis dated 12/20/2018 FINDINGS: Right Kidney: Renal measurements: 9.4 x 4.3 x 5.1 cm = volume: 107 mL. The right kidney is echogenic and mildly atrophic. No hydronephrosis or shadowing stone. Left Kidney: Renal measurements: 9.8 x 5.1 x 5.5 cm = volume: 144 mL. The left kidney is mildly atrophic and echogenic. No hydronephrosis or shadowing stone. Bladder: Appears normal for degree of bladder distention. Other: None. IMPRESSION: Increased renal echogenicity in keeping with chronic  kidney disease. No hydronephrosis or shadowing stone. Electronically Signed   By: Anner Crete M.D.   On: 02/14/2019 19:53   Nm Hepato W/eject Fract  Result Date: 02/16/2019 CLINICAL DATA:  Right upper quadrant pain. Evaluate for cholecystitis EXAM: NUCLEAR MEDICINE HEPATOBILIARY IMAGING TECHNIQUE: Sequential images of the abdomen were obtained out to 60 minutes following intravenous administration of radiopharmaceutical. RADIOPHARMACEUTICALS:  5.1 mCi Tc-57m  Choletec IV COMPARISON:  Abdominal sonogram 02/13/2019 FINDINGS: Prompt uptake and biliary excretion of activity by the liver is seen. Biliary activity passes into small bowel, consistent with patent common bile duct. Imaging was performed for 90 minutes without gallbladder activity. Patient subsequently received 3 mg of morphine sulfate, IV. After the IV administration of morphine gallbladder activity is visualized, consistent with patency of cystic duct. IMPRESSION: 1. Patent cystic duct without evidence for acute cholecystitis. 2. Delayed imaging of the gallbladder which may reflect chronic cholecystitis. Electronically Signed   By: Kerby Moors M.D.   On: 02/16/2019 15:27    Cardiac Studies   Echo 02/13/19  1. Left ventricular ejection fraction, by visual estimation, is 40 to 45%. The left ventricle has mild to moderately decreased function. There is moderately increased left ventricular hypertrophy.  2. Left ventricular diastolic parameters are consistent with Grade II diastolic dysfunction (pseudonormalization).  3. Diffuse hypokinesis worse in septum.  4. Global right ventricle has normal systolic function.The right ventricular size is normal. No increase in right ventricular wall thickness.  5. Left atrial size was mildly dilated.  6. Right atrial size was normal.  7. Moderate mitral annular calcification.  8. The mitral valve is normal in structure. Mild mitral valve regurgitation.  9. The tricuspid valve is normal in  structure. Tricuspid valve regurgitation is mild. 10. Aortic valve regurgitation is mild to moderate. 11. The aortic valve is tricuspid. Aortic valve regurgitation is mild to moderate. Mild to moderate aortic valve sclerosis/calcification without any evidence of aortic stenosis. 12. The pulmonic valve was grossly normal. Pulmonic valve regurgitation is mild. 13. Moderately elevated pulmonary artery systolic pressure.  Patient Profile     68 y.o. female with PMH chronic diastolic heart failure with recent reduction in EF consistent with acute systolic heart failure, chronic kidney disease stage 4, asthma, type II diabetes, hypertension, OSA, tobacco use who is followed for chest/abdominal pain and fluid overload.  Assessment & Plan    Acute systolic and acute on chronic diastolic heart  failure: -lungs clear, but has significant edema. More consistent with right sided heart failure. PASP 62 mmHg on most recent echo, consistent with pulmonary hypertension. -admission weight 117.2 kg, weight today 111.7 kg, net negative almost 7 L. Last outpatient weight was 116.4 kg. She will likely continue to have LE edema given her PA pressures -Cr 2.93, has known chronic kidney disease stage 4, nephrology following -diuresis per renal recs -GDMT: hydralazine 100 mg TID, imdur 90 mg daily (increase from home doses). On metoprolol succinate 50 mg daily this admission, prior had ben carvedilol 12.5 mg BID. -no ACEi/ARB/ARNI/MRA due to severity of CKD  Chest pain: continuous, she localized it more abdominally for me today -no elevation in hsTn -tender on palpation -she does have reduced EF, but she is high risk for contrast nephropathy. Would continue medical management, no plans for cath at this time. -continue aspirin 81 mg, atorvastatin 40 mg, beta blocker  [facility] will sign off.   Medication Recommendations:  No changes beyond change to oral diuretics when able, per nephrology Other  recommendations (labs, testing, etc):  none Follow up as an outpatient:  We will arrange outpatient follow up with Dr. Oval Linsey or a member of her team.  For questions or updates, please contact [facility] Please consult [email or web address] for contact info under        Signed, Buford Dresser, MD  02/16/2019, 6:09 PM

## 2019-02-16 NOTE — Progress Notes (Signed)
Patient very anxious and called husband for comfort. She's anxious about her HIDA scan today.

## 2019-02-17 LAB — RENAL FUNCTION PANEL
Albumin: 2.7 g/dL — ABNORMAL LOW (ref 3.5–5.0)
Anion gap: 14 (ref 5–15)
BUN: 40 mg/dL — ABNORMAL HIGH (ref 8–23)
CO2: 30 mmol/L (ref 22–32)
Calcium: 8.8 mg/dL — ABNORMAL LOW (ref 8.9–10.3)
Chloride: 96 mmol/L — ABNORMAL LOW (ref 98–111)
Creatinine, Ser: 3.21 mg/dL — ABNORMAL HIGH (ref 0.44–1.00)
GFR calc Af Amer: 17 mL/min — ABNORMAL LOW (ref >60)
GFR calc non Af Amer: 14 mL/min — ABNORMAL LOW (ref >60)
Glucose, Bld: 143 mg/dL — ABNORMAL HIGH (ref 70–99)
Phosphorus: 5 mg/dL — ABNORMAL HIGH (ref 2.5–4.6)
Potassium: 4 mmol/L (ref 3.5–5.1)
Sodium: 140 mmol/L (ref 135–145)

## 2019-02-17 LAB — CBC
HCT: 28.2 % — ABNORMAL LOW (ref 36.0–46.0)
Hemoglobin: 8.9 g/dL — ABNORMAL LOW (ref 12.0–15.0)
MCH: 26.6 pg (ref 26.0–34.0)
MCHC: 31.6 g/dL (ref 30.0–36.0)
MCV: 84.2 fL (ref 80.0–100.0)
Platelets: 224 10*3/uL (ref 150–400)
RBC: 3.35 MIL/uL — ABNORMAL LOW (ref 3.87–5.11)
RDW: 16.2 % — ABNORMAL HIGH (ref 11.5–15.5)
WBC: 8.6 10*3/uL (ref 4.0–10.5)
nRBC: 0 % (ref 0.0–0.2)

## 2019-02-17 LAB — GLUCOSE, CAPILLARY
Glucose-Capillary: 134 mg/dL — ABNORMAL HIGH (ref 70–99)
Glucose-Capillary: 149 mg/dL — ABNORMAL HIGH (ref 70–99)
Glucose-Capillary: 146 mg/dL — ABNORMAL HIGH (ref 70–99)
Glucose-Capillary: 197 mg/dL — ABNORMAL HIGH (ref 70–99)

## 2019-02-17 LAB — IRON AND TIBC
Iron: 29 ug/dL (ref 28–170)
Saturation Ratios: 11 % (ref 10.4–31.8)
TIBC: 267 ug/dL (ref 250–450)
UIBC: 238 ug/dL

## 2019-02-17 LAB — FERRITIN: Ferritin: 84 ng/mL (ref 11–307)

## 2019-02-17 MED ORDER — DICLOFENAC SODIUM 1 % EX GEL
2.0000 g | Freq: Four times a day (QID) | CUTANEOUS | Status: DC
  Administered 2019-02-17 – 2019-02-18 (×4): 2 g via TOPICAL
  Filled 2019-02-17: qty 100

## 2019-02-17 MED ORDER — FUROSEMIDE 80 MG PO TABS
80.0000 mg | ORAL_TABLET | Freq: Two times a day (BID) | ORAL | Status: DC
  Administered 2019-02-17 – 2019-02-18 (×2): 80 mg via ORAL
  Filled 2019-02-17 (×2): qty 1

## 2019-02-17 NOTE — Care Management Important Message (Signed)
Important Message  Patient Details  Name: Zoe Clarke MRN: [id number] Date of Birth: 07-30-1950   Medicare Important Message Given:  Yes     Shelda Altes 02/17/2019, 10:56 AM

## 2019-02-17 NOTE — Progress Notes (Signed)
OT Cancellation Note  Patient Details Name: Zoe Clarke MRN: [id number] DOB: 02/04/1951   Cancelled Treatment:    Reason Eval/Treat Not Completed: Patient declined, no reason specified; pt declines due to fatigue and "drowsy" from medication.  Will follow and see as able.   Delight Stare, OT Acute Rehabilitation Services Pager [phone] Office [phone]   Delight Stare 02/17/2019, 10:06 AM

## 2019-02-17 NOTE — Final Consult Note (Signed)
Consultant Final Sign-Off Note    Assessment/Final recommendations  Zoe Clarke is a 68 y.o. female followed by me for chronic cholecystitis. No indication for acute surgical intervention. After discharge patient may contact our office to schedule appointment to discuss elective cholecystectomy. She will need to be improved medically to consider elective surgery.  Wound care (if applicable): none   Diet at discharge: recommend low fat diet   Activity at discharge: per primary team   Follow-up appointment:  Info on AVS, call after discharge to arrange follow up appointment to discuss cholecystetomy   Pending results:  Unresulted Labs (From admission, onward)    Start     Ordered   02/17/19 0500  Renal function panel AM  Daily,   R     02/16/19 1736           Medication recommendations: consider discharging home with antinausea medication to use PRN   Other recommendations:    Thank you for allowing [place] to participate in the care of your patient!  Please consult [place] again if you have further needs for your patient.   A  02/17/2019 8:30 AM    Subjective  Sitting up on side of bed. Continues to have some mild RUQ abdominal pain, but currently it is minimal. She cannot remember what she ate for dinner last night, but she did tolerate this with only mild nausea, no emesis.    Objective  Vital signs in last 24 hours: Temp:  [97.8 F (36.6 C)-99.5 F (37.5 C)] 97.8 F (36.6 C) (12/01 0602) Pulse Rate:  [79-90] 79 (12/01 0602) Resp:  [18-20] 18 (12/01 0602) BP: (131-152)/(65-74) 152/67 (12/01 0602) SpO2:  [92 %-100 %] 100 % (12/01 0602) Weight:  [111.9 kg] 111.9 kg (12/01 0602)  Gen: Alert, NAD Cardiac: RRR Pulm: rate and effort normal, no wheezing GI: obese, soft, nondistended, nontender, +BS Skin: warm and dry Msk: mild BLE edema  Pertinent labs and Studies: Recent Labs    02/15/19 0514 02/16/19 0455 02/17/19 0511  WBC 7.1 8.2 8.6  HGB 8.1*  8.7* 8.9*  HCT 26.2* 27.8* 28.2*   BMET Recent Labs    02/16/19 0455 02/17/19 0511  NA 140 140  K 4.6 4.0  CL 97* 96*  CO2 31 30  GLUCOSE 123* 143*  BUN 34* 40*  CREATININE 2.92* 3.21*  CALCIUM 8.7* 8.8*   No results for input(s): LABURIN in the last 72 hours. Results for orders placed or performed during the hospital encounter of 02/13/19  SARS CORONAVIRUS 2 (TAT 6-24 HRS) Nasopharyngeal Nasopharyngeal Swab     Status: None   Collection Time: 02/13/19 12:33 PM   Specimen: Nasopharyngeal Swab  Result Value Ref Range Status   SARS Coronavirus 2 NEGATIVE NEGATIVE Final    Comment: (NOTE) SARS-CoV-2 target nucleic acids are NOT DETECTED. The SARS-CoV-2 RNA is generally detectable in upper and lower respiratory specimens during the acute phase of infection. Negative results do not preclude SARS-CoV-2 infection, do not rule out co-infections with other pathogens, and should not be used as the sole basis for treatment or other patient management decisions. Negative results must be combined with clinical observations, patient history, and epidemiological information. The expected result is Negative. Fact Sheet for Patients: SugarRoll.be Fact Sheet for Healthcare Providers: https://www.woods-mathews.com/ This test is not yet approved or cleared by the [place] FDA and  has been authorized for detection and/or diagnosis of SARS-CoV-2 by FDA under an Emergency Use Authorization (EUA). This EUA will remain  in  effect (meaning this test can be used) for the duration of the COVID-19 declaration under Section 56 4(b)(1) of the Act, 21 U.S.C. section 360bbb-3(b)(1), unless the authorization is terminated or revoked sooner. Performed at [facility], [place] [address], [place], [place] [address]     Imaging: Nm Hepato W/eject Fract  Result Date: 02/16/2019 CLINICAL DATA:  Right upper quadrant pain. Evaluate for cholecystitis EXAM:  NUCLEAR MEDICINE HEPATOBILIARY IMAGING TECHNIQUE: Sequential images of the abdomen were obtained out to 60 minutes following intravenous administration of radiopharmaceutical. RADIOPHARMACEUTICALS:  5.1 mCi Tc-54m  Choletec IV COMPARISON:  Abdominal sonogram 02/13/2019 FINDINGS: Prompt uptake and biliary excretion of activity by the liver is seen. Biliary activity passes into small bowel, consistent with patent common bile duct. Imaging was performed for 90 minutes without gallbladder activity. Patient subsequently received 3 mg of morphine sulfate, IV. After the IV administration of morphine gallbladder activity is visualized, consistent with patency of cystic duct. IMPRESSION: 1. Patent cystic duct without evidence for acute cholecystitis. 2. Delayed imaging of the gallbladder which may reflect chronic cholecystitis. Electronically Signed   By: Kerby Moors M.D.   On: 02/16/2019 15:27

## 2019-02-17 NOTE — Progress Notes (Signed)
Patient ID: Zoe Clarke, female   DOB: 1951-01-07, 68 y.o.   MRN: [id number] S: complaining of right wrist pain O:BP (!) 164/58   Pulse 83   Temp 98.4 F (36.9 C) (Oral)   Resp 18   Ht 5\' 8"  (1.727 m)   Wt 111.9 kg   SpO2 92%   BMI 37.51 kg/m   Intake/Output Summary (Last 24 hours) at 02/17/2019 1317 Last data filed at 02/17/2019 1238 Gross per 24 hour  Intake 3180 ml  Output 3000 ml  Net 180 ml   Intake/Output: I/O last 3 completed shifts: In: 3180 [P.O.:3180] Out: 5550 [Urine:5550]  Intake/Output this shift:  Total I/O In: 480 [P.O.:480] Out: 600 [Urine:600] Weight change: 0.19 kg Gen: NAD CVS: no rub Resp: cta Abd: +BS, soft, NT/ND Ext: no edema  Recent Labs  Lab 02/13/19 1102 02/14/19 0424 02/15/19 0514 02/16/19 0455 02/17/19 0511  NA 143 141 141 140 140  K 6.1* 5.4* 4.9 4.6 4.0  CL 109 106 106 97* 96*  CO2 25 26 28 31 30   GLUCOSE 128* 111* 131* 123* 143*  BUN 37* 37* 35* 34* 40*  CREATININE 2.83* 2.73* 2.70* 2.92* 3.21*  ALBUMIN 2.9*  --   --  2.7* 2.7*  CALCIUM 8.7* 8.4* 8.7* 8.7* 8.8*  PHOS  --   --   --   --  5.0*  AST 238*  --   --  64*  --   ALT 227*  --   --  178*  --    Liver Function Tests: Recent Labs  Lab 02/13/19 1102 02/16/19 0455 02/17/19 0511  AST 238* 64*  --   ALT 227* 178*  --   ALKPHOS 119 98  --   BILITOT 0.6 0.4  --   PROT 6.3* 6.1*  --   ALBUMIN 2.9* 2.7* 2.7*   No results for input(s): LIPASE, AMYLASE in the last 168 hours. No results for input(s): AMMONIA in the last 168 hours. CBC: Recent Labs  Lab 02/13/19 1102 02/15/19 0514 02/16/19 0455 02/17/19 0511  WBC 8.0 7.1 8.2 8.6  NEUTROABS 5.5 4.6 5.5  --   HGB 7.9* 8.1* 8.7* 8.9*  HCT 25.8* 26.2* 27.8* 28.2*  MCV 87.2 85.6 86.6 84.2  PLT 184 199 216 224   Cardiac Enzymes: No results for input(s): CKTOTAL, CKMB, CKMBINDEX, TROPONINI in the last 168 hours. CBG: Recent Labs  Lab 02/16/19 1253 02/16/19 1611 02/16/19 2111 02/17/19 0637 02/17/19 1139   GLUCAP 108* 140* 177* 134* 149*    Iron Studies:  Recent Labs    02/17/19 0511  IRON 29  TIBC 267  FERRITIN 84   Studies/Results: Nm Hepato W/eject Fract  Result Date: 02/16/2019 CLINICAL DATA:  Right upper quadrant pain. Evaluate for cholecystitis EXAM: NUCLEAR MEDICINE HEPATOBILIARY IMAGING TECHNIQUE: Sequential images of the abdomen were obtained out to 60 minutes following intravenous administration of radiopharmaceutical. RADIOPHARMACEUTICALS:  5.1 mCi Tc-59m  Choletec IV COMPARISON:  Abdominal sonogram 02/13/2019 FINDINGS: Prompt uptake and biliary excretion of activity by the liver is seen. Biliary activity passes into small bowel, consistent with patent common bile duct. Imaging was performed for 90 minutes without gallbladder activity. Patient subsequently received 3 mg of morphine sulfate, IV. After the IV administration of morphine gallbladder activity is visualized, consistent with patency of cystic duct. IMPRESSION: 1. Patent cystic duct without evidence for acute cholecystitis. 2. Delayed imaging of the gallbladder which may reflect chronic cholecystitis. Electronically Signed   By: Queen Slough.D.  On: 02/16/2019 15:27   . aspirin EC  81 mg Oral Daily  . atorvastatin  40 mg Oral Daily  . cyclobenzaprine  5 mg Oral Daily  . diclofenac Sodium  2 g Topical QID  . furosemide  80 mg Intravenous Q8H  . gabapentin  300 mg Oral BID  . heparin  5,000 Units Subcutaneous Q8H  . hydrALAZINE  100 mg Oral TID  . hydrALAZINE  25 mg Oral Once  . insulin aspart  0-15 Units Subcutaneous TID WC  . insulin aspart  0-5 Units Subcutaneous QHS  . isosorbide mononitrate  90 mg Oral Daily  . metoprolol succinate  50 mg Oral Daily  . pantoprazole  40 mg Oral Daily  . sodium chloride flush  3 mL Intravenous Q12H    BMET    Component Value Date/Time   NA 140 02/17/2019 0511   NA 142 11/21/2018 0812   K 4.0 02/17/2019 0511   CL 96 (L) 02/17/2019 0511   CO2 30 02/17/2019 0511    GLUCOSE 143 (H) 02/17/2019 0511   BUN 40 (H) 02/17/2019 0511   BUN 29 (H) 11/21/2018 0812   CREATININE 3.21 (H) 02/17/2019 0511   CREATININE 1.81 (H) 08/25/2015 1545   CALCIUM 8.8 (L) 02/17/2019 0511   GFRNONAA 14 (L) 02/17/2019 0511   GFRNONAA 29 (L) 08/25/2015 1545   GFRAA 17 (L) 02/17/2019 0511   GFRAA 34 (L) 08/25/2015 1545   CBC    Component Value Date/Time   WBC 8.6 02/17/2019 0511   RBC 3.35 (L) 02/17/2019 0511   HGB 8.9 (L) 02/17/2019 0511   HGB 12.2 09/10/2017 0942   HCT 28.2 (L) 02/17/2019 0511   HCT 38.7 09/10/2017 0942   PLT 224 02/17/2019 0511   PLT 290 09/10/2017 0942   MCV 84.2 02/17/2019 0511   MCV 81.6 01/13/2015 1506   MCH 26.6 02/17/2019 0511   MCHC 31.6 02/17/2019 0511   RDW 16.2 (H) 02/17/2019 0511   LYMPHSABS 1.5 02/16/2019 0455   MONOABS 0.6 02/16/2019 0455   EOSABS 0.5 02/16/2019 0455   BASOSABS 0.0 02/16/2019 0455     Assessment/Plan:  1. AKI/CKD stage 3-4- in setting of acute CHF exacerbation and likely cardiorenal syndrome.  BUN/Cr stable with diuresis.  No indication for dialysis.  Continue to follow.  Is followed by Dr. Johnney Ou in our office and has an appointment next week.   2. Acute on chronic CHF- improving with IV lasix 3. Acute hypoxic respiratory failure due to volume overload.  Improving with diuresis.  4. hyperkalemia- resolved with lasix 5. HTN- cont with meds 6. DM per primary 7. Cirrhosis by [place] 8. Anemia of CKD- will check iron stores and follow.  May require ESA.  Donetta Potts, MD [employer] [phone]

## 2019-02-17 NOTE — Progress Notes (Signed)
Progress Note    Zoe Clarke  QMV:[id number] DOB: May 05, 1950  DOA: 02/13/2019 PCP: Antony Contras, MD    Brief Narrative:     Medical records reviewed and are as summarized below:  Zoe Clarke is an 68 y.o. female  with medical history significant ofCKD stage IIIb, hypertension, cholelithiasis, morbid obesity, tobacco abuse, marijuana abuse, chronic diastolic congestive heart failure, anemia of chronic disease presents to emergency department due to worsening shortness of breath and weight gain.  Assessment/Plan:   Principal Problem:   Acute on chronic congestive heart failure (HCC) Active Problems:   Diabetes mellitus (HCC)   Tobacco abuse   CKD (chronic kidney disease), stage III   Anemia of chronic disease   Cholelithiasis   Marijuana abuse   Hyperkalemia   Elevated liver enzymes   Acute on chronic congestive heart failure: - Echo 02/13/2019 demonstrates EF 40-45% with Grade II diastolic dysfunction. There is diffuse hypokinesis worse in septum. Normal global right ventricular systolic function. Moderately elevated pulmonary artery systolic pressure.  - Consult cardiology for abnormal echocardiogram and decline of EF from 60-65% to 40-45%: Would continue medical management, no plans for cath at this time  -Continue coreg, statin, and imdur.  -IV lasix to PO lasix and monitor BMP, appreciate nephrology assistance. -down 8.1L  Acute hypoxic respiratory failure: Due to volume overload.  -wean to room air  Uncontrolled hypertension:  -resume home meds  Diabetes mellitus: Last A1c 6.2 on 12/20/2018. Well controlled on glipizide as outpatient.  SSI.   Abdominal pain; Rule out Cholecystitis: Could be secondary to hepatic congestion in the setting of acute on chronic congestive heart failure. Rt upper quadrant ultrasound demonstrates a thickened edematous gallbladder wall with pericholecystic fluid. There is evidence of cirrhosis of the liver.  -HIDA  scan negative for acute cholecystitis, can not rule out chronic cholecystitis.  -Surgery consulted outpatient follow up  AKI on CKD stage IV: GFR trended down from 21-19 and creatinine trended up from 2.29-2.83 in 1 month. Nephrology has been consulted. Monitor creatinine, electrolytes, and volume status.  lasix per nephrology  Hyperkalemia: received lokelma.  Resolved.    Anemia of chronic disease: Likely due to chronic renal disease. Monitor H&H and transfuse for hemoglobin less than 7.0.    Tobacco/marijuana abuse: Counseled about cessation  GERD: Stable continue Protonix  obesity Body mass index is 37.51 kg/m.  Shoulder pain -voltaren gel -3 years has been going on -outpatient follow up  Family Communication/Anticipated D/C date and plan/Code Status   DVT prophylaxis: heparin Code Status: Full Code.  Family Communication:  Disposition Plan: home in AM?   Medical Consultants:    Renal  cards  Subjective:   C/o shoulder pain-- has been going on for 3 years then rambles into needs a wheelchair that her PCP is setting up  Objective:    Vitals:   02/16/19 1257 02/16/19 2045 02/17/19 0602 02/17/19 1138  BP: (!) 150/65 131/74 (!) 152/67 (!) 164/58  Pulse: 90 79 79 83  Resp: 18 20 18 18   Temp: 99.5 F (37.5 C) 98.3 F (36.8 C) 97.8 F (36.6 C) 98.4 F (36.9 C)  TempSrc: Oral Oral Oral Oral  SpO2: 95% 92% 100% 92%  Weight:   111.9 kg   Height:        Intake/Output Summary (Last 24 hours) at 02/17/2019 1428 Last data filed at 02/17/2019 1238 Gross per 24 hour  Intake 3180 ml  Output 3000 ml  Net 180 ml   Filed  Weights   02/15/19 0503 02/16/19 0537 02/17/19 0602  Weight: 113.9 kg 111.7 kg 111.9 kg    Exam: In bed, older than stated age No increased work of breathing Able to move both arms Min LE edema A+OX3   Data Reviewed:   I have personally reviewed following labs and imaging studies:  Labs: Labs show the following:   Basic  Metabolic Panel: Recent Labs  Lab 02/13/19 1102 02/13/19 1609 02/14/19 0424 02/15/19 0514 02/16/19 0455 02/17/19 0511  NA 143  --  141 141 140 140  K 6.1*  --  5.4* 4.9 4.6 4.0  CL 109  --  106 106 97* 96*  CO2 25  --  26 28 31 30   GLUCOSE 128*  --  111* 131* 123* 143*  BUN 37*  --  37* 35* 34* 40*  CREATININE 2.83*  --  2.73* 2.70* 2.92* 3.21*  CALCIUM 8.7*  --  8.4* 8.7* 8.7* 8.8*  MG  --  2.4  --   --   --   --   PHOS  --   --   --   --   --  5.0*   GFR Estimated Creatinine Clearance: 22.6 mL/min (A) (by C-G formula based on SCr of 3.21 mg/dL (H)). Liver Function Tests: Recent Labs  Lab 02/13/19 1102 02/16/19 0455 02/17/19 0511  AST 238* 64*  --   ALT 227* 178*  --   ALKPHOS 119 98  --   BILITOT 0.6 0.4  --   PROT 6.3* 6.1*  --   ALBUMIN 2.9* 2.7* 2.7*   No results for input(s): LIPASE, AMYLASE in the last 168 hours. No results for input(s): AMMONIA in the last 168 hours. Coagulation profile No results for input(s): INR, PROTIME in the last 168 hours.  CBC: Recent Labs  Lab 02/13/19 1102 02/15/19 0514 02/16/19 0455 02/17/19 0511  WBC 8.0 7.1 8.2 8.6  NEUTROABS 5.5 4.6 5.5  --   HGB 7.9* 8.1* 8.7* 8.9*  HCT 25.8* 26.2* 27.8* 28.2*  MCV 87.2 85.6 86.6 84.2  PLT 184 199 216 224   Cardiac Enzymes: No results for input(s): CKTOTAL, CKMB, CKMBINDEX, TROPONINI in the last 168 hours. BNP (last 3 results) No results for input(s): PROBNP in the last 8760 hours. CBG: Recent Labs  Lab 02/16/19 1253 02/16/19 1611 02/16/19 2111 02/17/19 0637 02/17/19 1139  GLUCAP 108* 140* 177* 134* 149*   D-Dimer: No results for input(s): DDIMER in the last 72 hours. Hgb A1c: No results for input(s): HGBA1C in the last 72 hours. Lipid Profile: No results for input(s): CHOL, HDL, LDLCALC, TRIG, CHOLHDL, LDLDIRECT in the last 72 hours. Thyroid function studies: No results for input(s): TSH, T4TOTAL, T3FREE, THYROIDAB in the last 72 hours.  Invalid input(s): FREET3  Anemia work up: Recent Labs    02/17/19 0511  FERRITIN 84  TIBC 267  IRON 29   Sepsis Labs: Recent Labs  Lab 02/13/19 1102 02/15/19 0514 02/16/19 0455 02/17/19 0511  WBC 8.0 7.1 8.2 8.6    Microbiology Recent Results (from the past 240 hour(s))  SARS CORONAVIRUS 2 (TAT 6-24 HRS) Nasopharyngeal Nasopharyngeal Swab     Status: None   Collection Time: 02/13/19 12:33 PM   Specimen: Nasopharyngeal Swab  Result Value Ref Range Status   SARS Coronavirus 2 NEGATIVE NEGATIVE Final    Comment: (NOTE) SARS-CoV-2 target nucleic acids are NOT DETECTED. The SARS-CoV-2 RNA is generally detectable in upper and lower respiratory specimens during the acute phase of infection.  Negative results do not preclude SARS-CoV-2 infection, do not rule out co-infections with other pathogens, and should not be used as the sole basis for treatment or other patient management decisions. Negative results must be combined with clinical observations, patient history, and epidemiological information. The expected result is Negative. Fact Sheet for Patients: SugarRoll.be Fact Sheet for Healthcare Providers: https://www.woods-mathews.com/ This test is not yet approved or cleared by the [place] FDA and  has been authorized for detection and/or diagnosis of SARS-CoV-2 by FDA under an Emergency Use Authorization (EUA). This EUA will remain  in effect (meaning this test can be used) for the duration of the COVID-19 declaration under Section 56 4(b)(1) of the Act, 21 U.S.C. section 360bbb-3(b)(1), unless the authorization is terminated or revoked sooner. Performed at [facility], [place] [address] [place], [place] [address]     Procedures and diagnostic studies:  Nm Hepato W/eject Fract  Result Date: 02/16/2019 CLINICAL DATA:  Right upper quadrant pain. Evaluate for cholecystitis EXAM: NUCLEAR MEDICINE HEPATOBILIARY IMAGING TECHNIQUE: Sequential  images of the abdomen were obtained out to 60 minutes following intravenous administration of radiopharmaceutical. RADIOPHARMACEUTICALS:  5.1 mCi Tc-56m  Choletec IV COMPARISON:  Abdominal sonogram 02/13/2019 FINDINGS: Prompt uptake and biliary excretion of activity by the liver is seen. Biliary activity passes into small bowel, consistent with patent common bile duct. Imaging was performed for 90 minutes without gallbladder activity. Patient subsequently received 3 mg of morphine sulfate, IV. After the IV administration of morphine gallbladder activity is visualized, consistent with patency of cystic duct. IMPRESSION: 1. Patent cystic duct without evidence for acute cholecystitis. 2. Delayed imaging of the gallbladder which may reflect chronic cholecystitis. Electronically Signed   By: Kerby Moors M.D.   On: 02/16/2019 15:27    Medications:   . aspirin EC  81 mg Oral Daily  . atorvastatin  40 mg Oral Daily  . cyclobenzaprine  5 mg Oral Daily  . diclofenac Sodium  2 g Topical QID  . furosemide  80 mg Oral BID  . gabapentin  300 mg Oral BID  . heparin  5,000 Units Subcutaneous Q8H  . hydrALAZINE  100 mg Oral TID  . hydrALAZINE  25 mg Oral Once  . insulin aspart  0-15 Units Subcutaneous TID WC  . insulin aspart  0-5 Units Subcutaneous QHS  . isosorbide mononitrate  90 mg Oral Daily  . metoprolol succinate  50 mg Oral Daily  . pantoprazole  40 mg Oral Daily  . sodium chloride flush  3 mL Intravenous Q12H   Continuous Infusions: . sodium chloride       LOS: 4 days   Geradine Girt  [facility]   How to contact the [facility] Attending or Consulting provider [place] or covering provider during after hours [place], for this patient?  1. Check the care team in [facility] and look for a) attending/consulting TR[facility] provider listed and b) the [facility] team listed 2. Log into [email or web address] and use [place]'s universal password to access. If you do not have the password, please contact the hospital  operator. 3. Locate the [facility] provider you are looking for under [facility] and page to a number that you can be directly reached. 4. If you still have difficulty reaching the provider, please page the [facility] (Director on Call) for the Hospitalists listed on amion for assistance.  02/17/2019, 2:28 PM

## 2019-02-17 NOTE — Progress Notes (Signed)
Occupational Therapy Treatment Patient Details Name: Zoe Clarke MRN: [id number] DOB: October 25, 1950 Today's Date: 02/17/2019    History of present illness Pt adm with acute on chronic heart failure. Pt with recent adm for PNA. PMH - morbid obesity, chf, dm, htn, ckd, oa, arthritis.    OT comments  Patient supine in bed and agreeable for OT.  Bed mobility with min assist, min guard for transfers and min guard for peri care bathing standing at sink.  Patient fatigues easily and poor activity tolerance.  Continue to recommend HHOT at dc.  Will follow acutely, nearing baseline.    Follow Up Recommendations  Home health OT;Supervision/Assistance - 24 hour    Equipment Recommendations  None recommended by OT    Recommendations for Other Services      Precautions / Restrictions Precautions Precautions: Fall Restrictions Weight Bearing Restrictions: No       Mobility Bed Mobility Overal bed mobility: Needs Assistance Bed Mobility: Supine to Sit     Supine to sit: Min assist     General bed mobility comments: min assist to ascend trunk   Transfers Overall transfer level: Needs assistance Equipment used: Rolling walker (2 wheeled) Transfers: Sit to/from Stand Sit to Stand: Min guard;From elevated surface         General transfer comment: min guard for safety/balance, increased time and effort from elevated EOB     Balance Overall balance assessment: Needs assistance Sitting-balance support: No upper extremity supported;Feet supported Sitting balance-Leahy Scale: Fair     Standing balance support: During functional activity;Single extremity supported;Bilateral upper extremity supported Standing balance-Leahy Scale: Poor Standing balance comment: BUE support dynamically, able to groom with 1 UE support min guard                            ADL either performed or assessed with clinical judgement   ADL Overall ADL's : Needs assistance/impaired      Grooming: Wash/dry hands;Min guard;Standing       Lower Body Bathing: Minimal assistance;Sit to/from stand Lower Body Bathing Details (indicate cue type and reason): peri/buttocks hygiene with min guard at sink         Toilet Transfer: Min guard;Stand-pivot;Ambulation;RW Toilet Transfer Details (indicate cue type and reason): simulated in room         Functional mobility during ADLs: Rolling walker;Min guard       Vision       Perception     Praxis      Cognition Arousal/Alertness: Awake/alert Behavior During Therapy: WFL for tasks assessed/performed Overall Cognitive Status: Within Functional Limits for tasks assessed                                          Exercises     Shoulder Instructions       General Comments      Pertinent Vitals/ Pain       Pain Assessment: Faces Faces Pain Scale: Hurts a little bit Pain Location: generalized  Pain Descriptors / Indicators: Discomfort;Sore Pain Intervention(s): Monitored during session;Repositioned  Home Living                                          Prior Functioning/Environment  Frequency  Min 2X/week        Progress Toward Goals  OT Goals(current goals can now be found in the care plan section)  Progress towards OT goals: Progressing toward goals  Acute Rehab OT Goals Patient Stated Goal: home tomorrow OT Goal Formulation: With patient  Plan Discharge plan remains appropriate;Frequency remains appropriate    Co-evaluation                 AM-PAC OT "6 Clicks" Daily Activity     Outcome Measure   Help from another person eating meals?: None Help from another person taking care of personal grooming?: A Little Help from another person toileting, which includes using toliet, bedpan, or urinal?: A Little Help from another person bathing (including washing, rinsing, drying)?: A Lot Help from another person to put on and taking off  regular upper body clothing?: A Little Help from another person to put on and taking off regular lower body clothing?: A Lot 6 Click Score: 17    End of Session Equipment Utilized During Treatment: Rolling walker  OT Visit Diagnosis: Other abnormalities of gait and mobility (R26.89);Muscle weakness (generalized) (M62.81)   Activity Tolerance Patient tolerated treatment well   Patient Left with call bell/phone within reach;with bed alarm set;Other (comment);with nursing/sitter in room(seated EOB )   Nurse Communication Mobility status;Other (comment)(purewick)        Time: 0623-7628 OT Time Calculation (min): 25 min  Charges: OT General Charges $OT Visit: 1 Visit OT Treatments $Self Care/Home Management : 23-37 mins  Delight Stare, [place] Pager [phone] Office [phone]    Delight Stare 02/17/2019, 5:04 PM

## 2019-02-17 NOTE — Plan of Care (Signed)
°  Problem: Education: °Goal: Ability to demonstrate management of disease process will improve °Outcome: Progressing °  °Problem: Education: °Goal: Ability to verbalize understanding of medication therapies will improve °Outcome: Progressing °  °Problem: Activity: °Goal: Capacity to carry out activities will improve °Outcome: Progressing °  °

## 2019-02-17 NOTE — Progress Notes (Signed)
PT Cancellation Note  Patient Details Name: Zoe Clarke MRN: [id number] DOB: [birth year]   Cancelled Treatment:    Reason Eval/Treat Not Completed: Fatigue/lethargy limiting ability to participate. Noted pt likely to d/c home today, will follow-up for PT treatment as schedule permits.  Mabeline Caras, PT, DPT Acute Rehabilitation Services  Pager [phone] Office [place] 02/17/2019, 10:05 AM

## 2019-02-18 LAB — RENAL FUNCTION PANEL
Albumin: 2.6 g/dL — ABNORMAL LOW (ref 3.5–5.0)
Anion gap: 11 (ref 5–15)
BUN: 42 mg/dL — ABNORMAL HIGH (ref 8–23)
CO2: 30 mmol/L (ref 22–32)
Calcium: 8.7 mg/dL — ABNORMAL LOW (ref 8.9–10.3)
Chloride: 97 mmol/L — ABNORMAL LOW (ref 98–111)
Creatinine, Ser: 3.11 mg/dL — ABNORMAL HIGH (ref 0.44–1.00)
GFR calc Af Amer: 17 mL/min — ABNORMAL LOW (ref >60)
GFR calc non Af Amer: 15 mL/min — ABNORMAL LOW (ref >60)
Glucose, Bld: 166 mg/dL — ABNORMAL HIGH (ref 70–99)
Phosphorus: 4.4 mg/dL (ref 2.5–4.6)
Potassium: 4 mmol/L (ref 3.5–5.1)
Sodium: 138 mmol/L (ref 135–145)

## 2019-02-18 LAB — GLUCOSE, CAPILLARY
Glucose-Capillary: 156 mg/dL — ABNORMAL HIGH (ref 70–99)
Glucose-Capillary: 146 mg/dL — ABNORMAL HIGH (ref 70–99)

## 2019-02-18 MED ORDER — HYDRALAZINE HCL 100 MG PO TABS: 100.0000 mg | ORAL_TABLET | Freq: Three times a day (TID) | ORAL | 0 refills | Status: AC

## 2019-02-18 MED ORDER — ISOSORBIDE MONONITRATE ER 30 MG PO TB24: 90.0000 mg | ORAL_TABLET | Freq: Every day | ORAL | 0 refills | Status: DC

## 2019-02-18 MED ORDER — DICLOFENAC SODIUM 1 % EX GEL: 2.0000 g | Freq: Four times a day (QID) | CUTANEOUS | 0 refills | Status: AC

## 2019-02-18 MED ORDER — METOPROLOL SUCCINATE ER 50 MG PO TB24: 50.0000 mg | ORAL_TABLET | Freq: Every day | ORAL | 0 refills | Status: DC

## 2019-02-18 MED ORDER — FUROSEMIDE 80 MG PO TABS: 80.0000 mg | ORAL_TABLET | Freq: Two times a day (BID) | ORAL | 0 refills | Status: AC

## 2019-02-18 MED ORDER — GABAPENTIN 300 MG PO CAPS: 300.0000 mg | ORAL_CAPSULE | Freq: Two times a day (BID) | ORAL | 0 refills | Status: AC

## 2019-02-18 NOTE — TOC Transition Note (Signed)
Transition of Care [facility]) - CM/SW Discharge Note   Patient Details  Name: Zoe Clarke MRN: [id number] Date of Birth: 1951-02-27  [facility] CM/SW Contact:  Zenon Mayo, RN Phone Number: 02/18/2019, 10:11 AM   Clinical Narrative:    NCM spoke with patient and offered choice for [facility]. She states she is active with [facility] and would like to continue with [facility] for [place], [place], [place].  NCM notified Mateo Flow with [facility] patient is for possible dc today.  Patient also states she will need cab voucher to get home, she or her spouse do not drive.  MD notified to put orders in.    Final next level of care: [place] Barriers to Discharge: No Barriers Identified   Patient Goals and CMS Choice Patient states their goals for this hospitalization and ongoing recovery are:: to be able to do more CMS Medicare.gov Compare Post Acute Care list provided to:: Patient Choice offered to / list presented to : Patient  Discharge Placement                       Discharge Plan and Services   Discharge Planning Services: CM Consult            DME Arranged: (NA)         HH Arranged: RN, PT, OT HH Agency: [place] ([facility]) Date HH Agency Contacted: 02/18/19 Time [place]: 1011 Representative spoke with at [place]: [place] ([place]) Interventions     Readmission Risk Interventions Readmission Risk Prevention Plan 12/22/2018 11/10/2018 09/30/2018  Transportation Screening Complete Complete -  HRI or [place] - Complete Complete  Social Work Consult for [place] Planning/Counseling - Complete -  Palliative Care Screening - Not Applicable Not Applicable  Medication Review Press photographer) Complete Complete -  PCP or Specialist appointment within 3-5 days of discharge Complete - -  [place] or Home Care Consult Complete - -  Palliative Care Screening Not Applicable - -  [place] Not Applicable - -  Some recent data might be hidden

## 2019-02-18 NOTE — Discharge Summary (Signed)
Physician Discharge Summary  Zoe Clarke ZOX:[id number] DOB: [birth year] DOA: 02/13/2019  PCP: Antony Contras, MD  Admit date: 02/13/2019 Discharge date: 02/18/2019  Admitted From: home Discharge disposition: home   Recommendations for Outpatient Follow-Up:   1. BMP at next visit with nephrology 2. Close outpatient follow up 3. Daily weights 4. outpatient general surgery follow up re: GB   Discharge Diagnosis:   Principal Problem:   Acute on chronic congestive heart failure (HCC) Active Problems:   Diabetes mellitus ([place])   Tobacco abuse   CKD (chronic kidney disease), stage III   Anemia of chronic disease   Cholelithiasis   Marijuana abuse   Hyperkalemia   Elevated liver enzymes    Discharge Condition: Improved.  Diet recommendation: Low sodium, heart healthy.  Carbohydrate-modified.    Wound care: None.  Code status: Full.   History of Present Illness:   Zoe Clarke is a 68 y.o. female with medical history significant of CKD stage IIIb, hypertension, cholelithiasis, morbid obesity, tobacco abuse, marijuana abuse, chronic diastolic congestive heart failure, anemia of chronic disease presents to emergency department due to worsening shortness of breath and weight gain.  Patient tells me that she gained about 15 pounds in 3 days, has leg swelling, orthopnea, PND and worsening shortness of breath.  She tells me that she is compliant with her home medication, denies high sodium diet, increase water intake, decreased urinary output, nausea, vomiting, diarrhea, cough, congestion, headache, blurry vision, chest pain, palpitation, bowel or sleep changes.  She lives with her husband, smokes few cigarettes per day, uses marijuana and denies alcohol intake.  She uses walker for ambulation.  Patient recently admitted with pneumonia on 12/20/2018 and discharged home on 12/22/2018.  She recently evaluated by gastroenterologist Dr. Ardis Hughs on 02/06/2019  outpatient regarding chronic abdominal pain and was reassured that her pain is likely coming from abdominal pannus.   Hospital Course by Problem:   Acute on chronic congestive heart failure: - Echo 02/13/2019 demonstrates EF 40-45% with Grade II diastolic dysfunction. There is diffuse hypokinesis worse in septum. Normal global right ventricular systolic function. Moderately elevated pulmonary artery systolic pressure.  - Consult cardiology for abnormal echocardiogram and decline of EF from 60-65% to 40-45%: Would continue medical management, no plans for cath at this time  -Continue coreg, statin, and imdur.  -IV lasix to PO lasix  -appreciate nephrology assistance. -down 8.1L  Acute hypoxic respiratory failure: Due to volume overload.  -wean to room air  Uncontrolled hypertension:  -resume home meds  Diabetes mellitus: Last A1c 6.2 on 12/20/2018. Well controlled on glipizide as outpatient.   Abdominal pain; Rule out Cholecystitis: Could be secondary to hepatic congestion in the setting of acute on chronic congestive heart failure. Rt upper quadrant ultrasound demonstrates a thickened edematous gallbladder wall with pericholecystic fluid. There is evidence of cirrhosis of the liver.  -HIDA scan negative for acute cholecystitis, can not rule out chronic cholecystitis.  -Surgery consulted outpatient follow up  AKI on CKD stage IV:  Trending down Lasix changed to PO  Hyperkalemia: received lokelma.  Resolved.    Anemia of chronic disease: Likely due to chronic renal disease. -defer to renal  Tobacco/marijuana abuse: Counseled about cessation  GERD: Stable continue Protonix  obesity Body mass index is 37.51 kg/m.  Shoulder pain -voltaren gel -3 years has been going on -outpatient follow up    Medical Consultants:   [facility] nephrology   Discharge Exam:   Vitals:   02/18/19  0428 02/18/19 0856  BP: (!) 168/63 (!) 154/65  Pulse: 79 80  Resp: 18    Temp: 98 F (36.7 C)   SpO2: 93% 90%   Vitals:   02/17/19 1138 02/17/19 2016 02/18/19 0428 02/18/19 0856  BP: (!) 164/58 132/71 (!) 168/63 (!) 154/65  Pulse: 83 86 79 80  Resp: 18 20 18    Temp: 98.4 F (36.9 C) 98 F (36.7 C) 98 F (36.7 C)   TempSrc: Oral Oral    SpO2: 92% 95% 93% 90%  Weight:   110.5 kg   Height:        General exam: Appears calm and comfortable.   The results of significant diagnostics from this hospitalization (including imaging, microbiology, ancillary and laboratory) are listed below for reference.     Procedures and Diagnostic Studies:   US Renal  Result Date: 02/14/2019 CLINICAL DATA:  68 year old female with chronic kidney disease. EXAM: RENAL / URINARY TRACT ULTRASOUND COMPLETE COMPARISON:  Abdominal ultrasound dated 02/13/2019 and CT abdomen pelvis dated 12/20/2018 FINDINGS: Right Kidney: Renal measurements: 9.4 x 4.3 x 5.1 cm = volume: 107 mL. The right kidney is echogenic and mildly atrophic. No hydronephrosis or shadowing stone. Left Kidney: Renal measurements: 9.8 x 5.1 x 5.5 cm = volume: 144 mL. The left kidney is mildly atrophic and echogenic. No hydronephrosis or shadowing stone. Bladder: Appears normal for degree of bladder distention. Other: None. IMPRESSION: Increased renal echogenicity in keeping with chronic kidney disease. No hydronephrosis or shadowing stone. Electronically Signed   By: Anner Crete M.D.   On: 02/14/2019 19:53   Dg Chest Port 1 View  Result Date: 02/13/2019 CLINICAL DATA:  68 year old presenting with acute onset of shortness of breath that began last night, associated with a productive cough. Patient states a 15 pound weight gain over the last 2 days. Current smoker with current history of CHF. EXAM: PORTABLE CHEST 1 VIEW COMPARISON:  12/20/2018 and earlier. FINDINGS: Cardiac silhouette markedly enlarged, unchanged. Pulmonary vascularity normal without evidence of pulmonary edema currently. Lungs clear. Possible small  BILATERAL pleural effusions. IMPRESSION: 1. Stable marked cardiomegaly without pulmonary edema. 2. Possible small BILATERAL pleural effusions. No acute cardiopulmonary disease otherwise. Electronically Signed   By: Evangeline Dakin M.D.   On: 02/13/2019 10:53   US Abdomen Limited Ruq  Result Date: 02/13/2019 CLINICAL DATA:  Elevated liver enzymes EXAM: ULTRASOUND ABDOMEN LIMITED RIGHT UPPER QUADRANT COMPARISON:  Jul 29, 2018 FINDINGS: Gallbladder: Sludge is noted in the gallbladder. No gallstones evident. Gallbladder wall is thickened and edematous appearing with mild pericholecystic fluid. No sonographic Murphy sign noted by sonographer. Common bile duct: Diameter: 4 mm. No intrahepatic or extrahepatic biliary duct dilatation. Liver: No focal lesion identified. Liver has a mildly nodular contour with a somewhat coarsened, increased echogenicity pattern. Portal vein is patent on color Doppler imaging with normal direction of blood flow towards the liver. Other: None. IMPRESSION: 1. Thickened, edematous gallbladder wall with pericholecystic fluid. No gallstones present, but sludge is noted in the gallbladder. Suspect a degree of a calculus cholecystitis. 2. The appearance of the liver is felt to be indicative hepatic cirrhosis. No focal liver lesions are evident. It must be cautioned that the sensitivity of ultrasound for detection of focal liver lesions is diminished in this circumstance. Electronically Signed   By: Lowella Grip III M.D.   On: 02/13/2019 14:44     Labs:   Basic Metabolic Panel: Recent Labs  Lab 02/13/19 1609 02/14/19 0424 02/15/19 0514 02/16/19 0455 02/17/19 0511 02/18/19  0432  NA  --  141 141 140 140 138  K  --  5.4* 4.9 4.6 4.0 4.0  CL  --  106 106 97* 96* 97*  CO2  --  26 28 31 30 30   GLUCOSE  --  111* 131* 123* 143* 166*  BUN  --  37* 35* 34* 40* 42*  CREATININE  --  2.73* 2.70* 2.92* 3.21* 3.11*  CALCIUM  --  8.4* 8.7* 8.7* 8.8* 8.7*  MG 2.4  --   --   --   --    --   PHOS  --   --   --   --  5.0* 4.4   GFR Estimated Creatinine Clearance: 23.2 mL/min (A) (by C-G formula based on SCr of 3.11 mg/dL (H)). Liver Function Tests: Recent Labs  Lab 02/13/19 1102 02/16/19 0455 02/17/19 0511 02/18/19 0432  AST 238* 64*  --   --   ALT 227* 178*  --   --   ALKPHOS 119 98  --   --   BILITOT 0.6 0.4  --   --   PROT 6.3* 6.1*  --   --   ALBUMIN 2.9* 2.7* 2.7* 2.6*   No results for input(s): LIPASE, AMYLASE in the last 168 hours. No results for input(s): AMMONIA in the last 168 hours. Coagulation profile No results for input(s): INR, PROTIME in the last 168 hours.  CBC: Recent Labs  Lab 02/13/19 1102 02/15/19 0514 02/16/19 0455 02/17/19 0511  WBC 8.0 7.1 8.2 8.6  NEUTROABS 5.5 4.6 5.5  --   HGB 7.9* 8.1* 8.7* 8.9*  HCT 25.8* 26.2* 27.8* 28.2*  MCV 87.2 85.6 86.6 84.2  PLT 184 199 216 224   Cardiac Enzymes: No results for input(s): CKTOTAL, CKMB, CKMBINDEX, TROPONINI in the last 168 hours. BNP: Invalid input(s): POCBNP CBG: Recent Labs  Lab 02/17/19 0637 02/17/19 1139 02/17/19 1615 02/17/19 2122 02/18/19 0616  GLUCAP 134* 149* 146* 197* 156*   D-Dimer No results for input(s): DDIMER in the last 72 hours. Hgb A1c No results for input(s): HGBA1C in the last 72 hours. Lipid Profile No results for input(s): CHOL, HDL, LDLCALC, TRIG, CHOLHDL, LDLDIRECT in the last 72 hours. Thyroid function studies No results for input(s): TSH, T4TOTAL, T3FREE, THYROIDAB in the last 72 hours.  Invalid input(s): FREET3 Anemia work up Recent Labs    02/17/19 0511  FERRITIN 84  TIBC 267  IRON 29   Microbiology Recent Results (from the past 240 hour(s))  SARS CORONAVIRUS 2 (TAT 6-24 HRS) Nasopharyngeal Nasopharyngeal Swab     Status: None   Collection Time: 02/13/19 12:33 PM   Specimen: Nasopharyngeal Swab  Result Value Ref Range Status   SARS Coronavirus 2 NEGATIVE NEGATIVE Final    Comment: (NOTE) SARS-CoV-2 target nucleic acids are NOT  DETECTED. The SARS-CoV-2 RNA is generally detectable in upper and lower respiratory specimens during the acute phase of infection. Negative results do not preclude SARS-CoV-2 infection, do not rule out co-infections with other pathogens, and should not be used as the sole basis for treatment or other patient management decisions. Negative results must be combined with clinical observations, patient history, and epidemiological information. The expected result is Negative. Fact Sheet for Patients: SugarRoll.be Fact Sheet for Healthcare Providers: https://www.woods-mathews.com/ This test is not yet approved or cleared by the [place] FDA and  has been authorized for detection and/or diagnosis of SARS-CoV-2 by FDA under an Emergency Use Authorization (EUA). This EUA will remain  in effect (  meaning this test can be used) for the duration of the COVID-19 declaration under Section 56 4(b)(1) of the Act, 21 U.S.C. section 360bbb-3(b)(1), unless the authorization is terminated or revoked sooner. Performed at [facility], [place] [address], [place], [place] [address]      Discharge Instructions:   Discharge Instructions    Diet - low sodium heart healthy   Complete by: As directed    Diet Carb Modified   Complete by: As directed    Discharge instructions   Complete by: As directed    Bmp at next nephrology visit Home health   Increase activity slowly   Complete by: As directed      Allergies as of 02/18/2019      Reactions   Aspirin Other (See Comments)   Feels funny    Allopurinol    Dizzy; hard to ambulate  Couldn't see; sweating   Amlodipine Other (See Comments)   headache   Lisinopril Swelling   Face swelling   Other Itching   Squash      Medication List    STOP taking these medications   carvedilol 12.5 MG tablet Commonly known as: COREG     TAKE these medications   albuterol 108 (90 Base) MCG/ACT  inhaler Commonly known as: VENTOLIN HFA Inhale 2 puffs into the lungs every 6 (six) hours as needed for wheezing or shortness of breath.   atorvastatin 40 MG tablet Commonly known as: LIPITOR Take 40 mg by mouth daily.   cyclobenzaprine 5 MG tablet Commonly known as: FLEXERIL Take 5-10 mg by mouth daily.   diclofenac Sodium 1 % Gel Commonly known as: VOLTAREN Apply 2 g topically 4 (four) times daily.   ergocalciferol 1.25 MG (50000 UT) capsule Commonly known as: VITAMIN D2 Take 50,000 Units by mouth every Friday.   fexofenadine 180 MG tablet Commonly known as: ALLEGRA Take 180 mg by mouth daily as needed for allergies.   furosemide 80 MG tablet Commonly known as: LASIX Take 1 tablet (80 mg total) by mouth 2 (two) times daily. What changed:   medication strength  how much to take   gabapentin 300 MG capsule Commonly known as: NEURONTIN Take 1 capsule (300 mg total) by mouth 2 (two) times daily. What changed:   how much to take  when to take this   glipiZIDE 10 MG 24 hr tablet Commonly known as: GLUCOTROL XL Take 10 mg by mouth daily with breakfast.   hydrALAZINE 100 MG tablet Commonly known as: APRESOLINE Take 1 tablet (100 mg total) by mouth 3 (three) times daily. What changed:   medication strength  how much to take   hydrocortisone cream 1 % Apply topically 2 (two) times daily. Areas of itch   isosorbide mononitrate 30 MG 24 hr tablet Commonly known as: IMDUR Take 3 tablets (90 mg total) by mouth daily. Start taking on: February 19, 2019 What changed:   medication strength  how much to take   Linzess 145 MCG Caps capsule Generic drug: linaclotide Take 145 mcg by mouth daily as needed (constipation).   metoprolol succinate 50 MG 24 hr tablet Commonly known as: TOPROL-XL Take 1 tablet (50 mg total) by mouth daily. Take with or immediately following a meal. Start taking on: February 19, 2019   multivitamin with minerals Tabs tablet Take 1  tablet by mouth daily.   oxyCODONE-acetaminophen 5-325 MG tablet Commonly known as: PERCOCET/ROXICET Take 0.5 tablets by mouth every 6 (six) hours as needed for moderate  pain or severe pain.   pantoprazole 40 MG tablet Commonly known as: PROTONIX Take 1 tablet (40 mg total) by mouth daily.   polyethylene glycol 17 g packet Commonly known as: MIRALAX / GLYCOLAX Take 17 g by mouth daily as needed for mild constipation.   triamcinolone cream 0.1 % Commonly known as: KENALOG Apply 1 application topically daily as needed (itching on back).   triamcinolone ointment 0.1 % Commonly known as: KENALOG Apply 1 application topically 2 (two) times daily. Gel on inside of ear      Follow-up Information    Call Coralie Keens, MD.   Specialty: General Surgery Why: call after you are discharged to make an appointment to discuss gallbladder surgery Contact information: [place] [address] [phone]        Antony Contras, MD On 02/19/2019.   Specialty: Family Medicine Why: @10 :00am with Alonna Minium information: [place] [address] [place] Follow up.   Why: HHRN, HHPT, HHOT to resume           Time coordinating discharge:35 min  Signed:  Geradine Girt DO  [facility] 02/18/2019, 11:03 AM

## 2019-02-19 DIAGNOSIS — N179 Acute kidney failure, unspecified: Secondary | ICD-10-CM | POA: Diagnosis not present

## 2019-02-19 DIAGNOSIS — I5043 Acute on chronic combined systolic (congestive) and diastolic (congestive) heart failure: Secondary | ICD-10-CM | POA: Diagnosis not present

## 2019-02-19 DIAGNOSIS — I10 Essential (primary) hypertension: Secondary | ICD-10-CM | POA: Diagnosis not present

## 2019-02-19 DIAGNOSIS — E1121 Type 2 diabetes mellitus with diabetic nephropathy: Secondary | ICD-10-CM | POA: Diagnosis not present

## 2019-02-19 DIAGNOSIS — Z7984 Long term (current) use of oral hypoglycemic drugs: Secondary | ICD-10-CM | POA: Diagnosis not present

## 2019-02-19 DIAGNOSIS — R1084 Generalized abdominal pain: Secondary | ICD-10-CM | POA: Diagnosis not present

## 2019-02-19 DIAGNOSIS — J9601 Acute respiratory failure with hypoxia: Secondary | ICD-10-CM | POA: Diagnosis not present

## 2019-02-19 DIAGNOSIS — G8929 Other chronic pain: Secondary | ICD-10-CM | POA: Diagnosis not present

## 2019-02-19 DIAGNOSIS — D638 Anemia in other chronic diseases classified elsewhere: Secondary | ICD-10-CM | POA: Diagnosis not present

## 2019-02-19 DIAGNOSIS — N183 Chronic kidney disease, stage 3 unspecified: Secondary | ICD-10-CM | POA: Diagnosis not present

## 2019-02-20 ENCOUNTER — Encounter: Payer: Self-pay | Admitting: Neurology

## 2019-02-20 DIAGNOSIS — E1140 Type 2 diabetes mellitus with diabetic neuropathy, unspecified: Secondary | ICD-10-CM | POA: Diagnosis not present

## 2019-02-20 DIAGNOSIS — D631 Anemia in chronic kidney disease: Secondary | ICD-10-CM | POA: Diagnosis not present

## 2019-02-20 DIAGNOSIS — M1A30X0 Chronic gout due to renal impairment, unspecified site, without tophus (tophi): Secondary | ICD-10-CM | POA: Diagnosis not present

## 2019-02-20 DIAGNOSIS — G4730 Sleep apnea, unspecified: Secondary | ICD-10-CM | POA: Diagnosis not present

## 2019-02-20 DIAGNOSIS — I130 Hypertensive heart and chronic kidney disease with heart failure and stage 1 through stage 4 chronic kidney disease, or unspecified chronic kidney disease: Secondary | ICD-10-CM | POA: Diagnosis not present

## 2019-02-20 DIAGNOSIS — N183 Chronic kidney disease, stage 3 unspecified: Secondary | ICD-10-CM | POA: Diagnosis not present

## 2019-02-20 DIAGNOSIS — M1990 Unspecified osteoarthritis, unspecified site: Secondary | ICD-10-CM | POA: Diagnosis not present

## 2019-02-20 DIAGNOSIS — I5032 Chronic diastolic (congestive) heart failure: Secondary | ICD-10-CM | POA: Diagnosis not present

## 2019-02-20 DIAGNOSIS — K219 Gastro-esophageal reflux disease without esophagitis: Secondary | ICD-10-CM | POA: Diagnosis not present

## 2019-02-20 DIAGNOSIS — J45909 Unspecified asthma, uncomplicated: Secondary | ICD-10-CM | POA: Diagnosis not present

## 2019-02-20 DIAGNOSIS — E1122 Type 2 diabetes mellitus with diabetic chronic kidney disease: Secondary | ICD-10-CM | POA: Diagnosis not present

## 2019-02-20 DIAGNOSIS — K8020 Calculus of gallbladder without cholecystitis without obstruction: Secondary | ICD-10-CM | POA: Diagnosis not present

## 2019-02-20 DIAGNOSIS — E785 Hyperlipidemia, unspecified: Secondary | ICD-10-CM | POA: Diagnosis not present

## 2019-02-20 NOTE — Progress Notes (Signed)
Zoe Clarke was seen today in the movement disorders clinic for neurologic consultation at the request of Antony Contras, MD.  The consultation is for the evaluation of tremor.  Patient is a 68 year old female with a history of hypertension, stage IV kidney disease, chronic physical deconditioning (considering electric wheelchair), chronic pain, diabetes with diabetic neuropathy who presents for the evaluation of tremor.  Tremor: Yes.     How long has it been going on? 2017, states started after a fall out of the bed after a colonoscopy when sedated.  States that in addition to tremor she has a pain in the arm and paresthesias in the L hand that affect ability to grasp on the L.  Also notices a Jerking of the hands.  At rest or with activation?  rest  Fam hx of tremor?  No.  Located where?  L hand but the "whole body will tremble" - she is L hand dominant  Affected by caffeine:  Doesn't drink caffeine  Affected by alcohol:  Doesn't drink alcohol  Affected by stress:  No.  Affected by fatigue:  Yes.    Spills soup if on spoon:  No.  Spills glass of liquid if full:  No.  Affects ADL's (tying shoes, brushing teeth, etc):  No.  Tremor inducing meds:  Yes.  albuterol (uses 1 time every two weeks)  Tremor improving Meds:  metoprolol  Other Specific Symptoms:  Voice: more raspy Sleep:   Vivid Dreams:  No.  Acting out dreams:  No. Wet Pillows: No. Postural symptoms:  Yes.  , thinks related to hip arthritis and bursitis in the hip and "sclerosis" of the R hip  Falls?  No. Bradykinesia symptoms: slow movements and difficulty getting out of a chair (attributes to her hip) Loss of smell:  No. Loss of taste:  No. Urinary Incontinence:  Yes.  , wears pad underneath for leakage Difficulty Swallowing:  No. Handwriting, micrographia: No. Trouble with ADL's:  Yes.  , husband assists with dressing in the LE because of hip/back pain  Trouble buttoning clothing: No. Depression:  No. Memory  changes:  No. Hallucinations:  No.  visual distortions: No. N/V:  No. Lightheaded:  No.  Syncope: No. Diplopia:  No. Dyskinesia:  No.  She had a CT brain in 2017 that was non acute   ALLERGIES:   Allergies  Allergen Reactions  . Aspirin Other (See Comments)    Feels funny   . Allopurinol     Dizzy; hard to ambulate  Couldn't see; sweating  . Amlodipine Other (See Comments)    headache  . Lisinopril Swelling    Face swelling  . Other Itching    Squash    CURRENT MEDICATIONS:  Current Outpatient Medications  Medication Instructions  . albuterol (PROVENTIL HFA;VENTOLIN HFA) 108 (90 Base) MCG/ACT inhaler 2 puffs, Inhalation, Every 6 hours PRN  . atorvastatin (LIPITOR) 40 mg, Oral, Daily  . cyclobenzaprine (FLEXERIL) 5-10 mg, Oral, Daily  . diclofenac Sodium (VOLTAREN) 2 g, Topical, 4 times daily  . ergocalciferol (VITAMIN D2) 50,000 Units, Oral, Every Fri  . fexofenadine (ALLEGRA) 180 mg, Oral, Daily PRN  . furosemide (LASIX) 80 mg, Oral, 2 times daily  . gabapentin (NEURONTIN) 300 mg, Oral, 2 times daily  . glipiZIDE (GLUCOTROL XL) 10 mg, Oral, Daily with breakfast  . hydrALAZINE (APRESOLINE) 100 mg, Oral, 3 times daily  . hydrocortisone cream 1 % Topical, 2 times daily, Areas of itch  . isosorbide mononitrate (IMDUR) 90 mg, Oral, Daily  .  linaclotide (LINZESS) 145 mcg, Oral, Daily PRN  . metoprolol succinate (TOPROL-XL) 50 mg, Oral, Daily, Take with or immediately following a meal.  . Multiple Vitamin (MULTIVITAMIN WITH MINERALS) TABS tablet 1 tablet, Oral, Daily  . oxyCODONE-acetaminophen (PERCOCET/ROXICET) 5-325 MG tablet 0.5 tablets, Oral, Every 6 hours PRN  . pantoprazole (PROTONIX) 40 mg, Oral, Daily  . polyethylene glycol (MIRALAX / GLYCOLAX) 17 g, Oral, Daily PRN  . triamcinolone cream (KENALOG) 0.1 % 1 application, Topical, Daily PRN  . triamcinolone ointment (KENALOG) 0.1 % 1 application, Topical, 2 times daily, Gel on inside of ear    PAST MEDICAL HISTORY:    Past Medical History:  Diagnosis Date  . Allergy   . Arthritis   . Asthma    weazing at night uses inhalers  . Bronchitis    history of  . Chronic diastolic CHF (congestive heart failure) ([place]) 04/2018  . CKD stage 4 due to type 2 diabetes mellitus ([place])   . Diabetes mellitus without complication ([place])   . Gastric ulcer   . GERD (gastroesophageal reflux disease)   . History of hiatal hernia   . Hypertension   . Osteoporosis   . Sleep apnea 09/22/2018    PAST SURGICAL HISTORY:   Past Surgical History:  Procedure Laterality Date  . DILATION AND CURETTAGE OF UTERUS    . EYE SURGERY Left    removed cataract w/ lens  . TUBAL LIGATION      SOCIAL HISTORY:   Social History   Socioeconomic History  . Marital status: Married    Spouse name: Not on file  . Number of children: 2  . Years of education: 50  . Highest education level: High school graduate  Occupational History  . Occupation: Housewife  Social Needs  . Financial resource strain: Not on file  . Food insecurity    Worry: Not on file    Inability: Not on file  . Transportation needs    Medical: Not on file    Non-medical: Not on file  Tobacco Use  . Smoking status: Current Every Day Smoker    Packs/day: 0.50    Years: 39.00    Pack years: 19.50    Types: Cigarettes  . Smokeless tobacco: Never Used  . Tobacco comment: 1 pack q 3-4 days  Substance and Sexual Activity  . Alcohol use: No    Alcohol/week: 0.0 standard drinks  . Drug use: Not Currently    Types: Marijuana  . Sexual activity: Not on file  Lifestyle  . Physical activity    Days per week: Not on file    Minutes per session: Not on file  . Stress: Not on file  Relationships  . Social Herbalist on phone: Not on file    Gets together: Not on file    Attends religious service: Not on file    Active member of club or organization: Not on file    Attends meetings of clubs or organizations: Not on file    Relationship status: Not on  file  . Intimate partner violence    Fear of current or ex partner: Not on file    Emotionally abused: Not on file    Physically abused: Not on file    Forced sexual activity: Not on file  Other Topics Concern  . Not on file  Social History Narrative   Fun: Race video car games - PS2/3, paint by number, board and card games, puzzle books   Feels  safe at home and denies abuse   Disabled - does not work from chronic pain   Children - 1 living children, 3 step children   Grandchildren - 5   Married - lives with husband   Uses [employer] transportation    FAMILY HISTORY:   Family Status  Relation Name Status  . Mother  Deceased  . Father  Deceased  . MGM  Deceased  . MGF  Deceased  . PGM  Deceased  . PGF  Deceased  . Sister  Deceased  . Sister  Deceased  . Brother 2 Alive  . Brother 1 Deceased  . Child  Deceased  . Child  Deceased  . Neg Hx  (Not Specified)    ROS:  Review of Systems  Constitutional: Negative.   HENT: Negative.   Eyes: Positive for blurred vision (R eye d/t cataract).  Respiratory: Positive for shortness of breath.   Cardiovascular: Negative.   Gastrointestinal: Negative.   Genitourinary: Positive for frequency and urgency.  Musculoskeletal: Positive for back pain and joint pain (R hip and pain in feet from gout).  Skin: Negative.   Neurological: Positive for tremors.  Endo/Heme/Allergies: Negative.     PHYSICAL EXAMINATION:    VITALS:   Vitals:   02/23/19 0920  BP: (!) 191/94  Pulse: 90  SpO2: 93%  Weight: 244 lb 9.6 oz (110.9 kg)  Height: 5\' 8"  (1.727 m)    GEN:  The patient appears stated age and is in NAD. HEENT:  Normocephalic, atraumatic.  The mucous membranes are moist. The superficial temporal arteries are without ropiness or tenderness. CV:  RRR Lungs:  CTAB.  She has DOE Neck/HEME:  There are no carotid bruits bilaterally.  Neurological examination:  Orientation: The patient is alert and oriented x3. Fund of knowledge is  appropriate.  Recent and remote memory are intact.  Attention and concentration are normal.    Able to name objects and repeat phrases. Cranial nerves: There is good facial symmetry. The visual fields are full to confrontational testing. The speech is fluent and clear. Soft palate rises symmetrically and there is no tongue deviation. Hearing is intact to conversational tone. Sensation: Sensation is intact to light  throughout (facial, trunk, extremities). Vibration is decreased at the bilateral ankle and knee (overall decreased distally). There is no extinction with double simultaneous stimulation. There is no sensory dermatomal level identified. Motor: Strength is 5/5 in the bilateral upper and lower extremities.   Shoulder shrug is equal and symmetric.  There is no pronator drift. Deep tendon reflexes: Deep tendon reflexes are 1/4 at the bilateral biceps, triceps, brachioradialis, absent at the bilateral patella and achilles. Plantar responses are downgoing bilaterally.  Movement examination: Tone: There is normal tone in the bilateral upper extremities.  The tone in the lower extremities is normal.  Abnormal movements: The patient does have some occasional myoclonic jerks in both of her upper extremities.  She does not really have much postural tremor.  I did not see much rest tremor.  She has some tremor with Archimedes spirals bilaterally. Coordination:  There is no decremation with RAM's, with any form of RAMS, including alternating supination and pronation of the forearm, hand opening and closing, finger taps, heel taps and toe taps. Gait and Station: The patient requires 2 person assist to get out of the chair.  She has her walker up very high for patient height.  She is not that steady with ambulation, but is able to walk herself with her walker.  ASSESSMENT/PLAN:  1.  Tremor  -I really did not see a lot of tremor on examination today.  I did see myoclonus, which I suspect is gabapentin  induced in the face of stage IV renal disease.  She is currently on gabapentin, 300 mg, 3 tablets 3 times per day.  She and I discussed nature and pathophysiology of gabapentin induced myoclonus.  I told her that reducing the dosage could help.  She does state that her prescribing physician did recently decrease the dose a little bit.  My suspicion is that it will need to be reduced more to decrease the myoclonus.  She states that she has a NP appt with nephrology on wed.  -She does have a little bit of essential tremor, but that does not appear to be the primary issue.  I would not recommend treating that.  2.  Left arm/hand pain and paresthesias  -Patient may have some carpal tunnel in that hand.  She states that she also have some upper arm pain that radiates down into the hand.  I was not able to find a sensory dermatomal level.  She states that everything started after the colonoscopy when she fell out of the bed and was sedated.  We will go ahead and schedule an EMG.  3.Peripheral neuropathy  -The patient has clinical examination evidence of a diffuse peripheral neuropathy, which certainly can affect gait and balance.  This is likely due to her diabetes.  She is already on a walker and is getting fitted for a motorized wheelchair.  4.  F/u depending on result of above.      Cc:  Antony Contras, MD

## 2019-02-23 ENCOUNTER — Other Ambulatory Visit: Payer: Self-pay

## 2019-02-23 ENCOUNTER — Ambulatory Visit: Payer: PPO | Admitting: Neurology

## 2019-02-23 ENCOUNTER — Encounter: Payer: Self-pay | Admitting: Neurology

## 2019-02-23 VITALS — BP 191/94 | HR 90 | Ht 68.0 in | Wt 244.6 lb

## 2019-02-23 DIAGNOSIS — R202 Paresthesia of skin: Secondary | ICD-10-CM | POA: Diagnosis not present

## 2019-02-23 DIAGNOSIS — G253 Myoclonus: Secondary | ICD-10-CM

## 2019-02-23 DIAGNOSIS — N184 Chronic kidney disease, stage 4 (severe): Secondary | ICD-10-CM | POA: Diagnosis not present

## 2019-02-23 DIAGNOSIS — M542 Cervicalgia: Secondary | ICD-10-CM

## 2019-02-23 DIAGNOSIS — E1142 Type 2 diabetes mellitus with diabetic polyneuropathy: Secondary | ICD-10-CM

## 2019-02-23 NOTE — Patient Instructions (Addendum)
1.  You do not have any evidence of Parkinsons 2.  I do think that the gabapentin is causing some of the jerking, which is called myoclonus 3. Please taken BP medication when you get home.  ELECTROMYOGRAM AND NERVE CONDUCTION STUDIES (EMG/NCS) INSTRUCTIONS  How to Prepare The neurologist conducting the EMG will need to know if you have certain medical conditions. Tell the neurologist and other EMG lab personnel if you: . Have a pacemaker or any other electrical medical device . Take blood-thinning medications . Have hemophilia, a blood-clotting disorder that causes prolonged bleeding Bathing Take a shower or bath shortly before your exam in order to remove oils from your skin. Don't apply lotions or creams before the exam.  What to Expect You'll likely be asked to change into a hospital gown for the procedure and lie down on an examination table. The following explanations can help you understand what will happen during the exam.  . Electrodes. The neurologist or a technician places surface electrodes at various locations on your skin depending on where you're experiencing symptoms. Or the neurologist may insert needle electrodes at different sites depending on your symptoms.  . Sensations. The electrodes will at times transmit a tiny electrical current that you may feel as a twinge or spasm. The needle electrode may cause discomfort or pain that usually ends shortly after the needle is removed. If you are concerned about discomfort or pain, you may want to talk to the neurologist about taking a short break during the exam.  . Instructions. During the needle EMG, the neurologist will assess whether there is any spontaneous electrical activity when the muscle is at rest - activity that isn't present in healthy muscle tissue - and the degree of activity when you slightly contract the muscle.  He or she will give you instructions on resting and contracting a muscle at appropriate times. Depending on  what muscles and nerves the neurologist is examining, he or she may ask you to change positions during the exam.  After your EMG You may experience some temporary, minor bruising where the needle electrode was inserted into your muscle. This bruising should fade within several days. If it persists, contact your primary care doctor.

## 2019-02-25 DIAGNOSIS — N184 Chronic kidney disease, stage 4 (severe): Secondary | ICD-10-CM | POA: Diagnosis not present

## 2019-02-25 DIAGNOSIS — N189 Chronic kidney disease, unspecified: Secondary | ICD-10-CM | POA: Diagnosis not present

## 2019-02-25 DIAGNOSIS — D631 Anemia in chronic kidney disease: Secondary | ICD-10-CM | POA: Diagnosis not present

## 2019-02-25 DIAGNOSIS — E1122 Type 2 diabetes mellitus with diabetic chronic kidney disease: Secondary | ICD-10-CM | POA: Diagnosis not present

## 2019-02-25 DIAGNOSIS — N2581 Secondary hyperparathyroidism of renal origin: Secondary | ICD-10-CM | POA: Diagnosis not present

## 2019-02-25 DIAGNOSIS — N179 Acute kidney failure, unspecified: Secondary | ICD-10-CM | POA: Diagnosis not present

## 2019-02-25 DIAGNOSIS — I129 Hypertensive chronic kidney disease with stage 1 through stage 4 chronic kidney disease, or unspecified chronic kidney disease: Secondary | ICD-10-CM | POA: Diagnosis not present

## 2019-02-25 DIAGNOSIS — E669 Obesity, unspecified: Secondary | ICD-10-CM | POA: Diagnosis not present

## 2019-02-25 DIAGNOSIS — Z720 Tobacco use: Secondary | ICD-10-CM | POA: Diagnosis not present

## 2019-02-25 DIAGNOSIS — E1140 Type 2 diabetes mellitus with diabetic neuropathy, unspecified: Secondary | ICD-10-CM | POA: Diagnosis not present

## 2019-02-27 DIAGNOSIS — I10 Essential (primary) hypertension: Secondary | ICD-10-CM | POA: Diagnosis not present

## 2019-02-27 DIAGNOSIS — G629 Polyneuropathy, unspecified: Secondary | ICD-10-CM | POA: Diagnosis not present

## 2019-02-27 DIAGNOSIS — M1611 Unilateral primary osteoarthritis, right hip: Secondary | ICD-10-CM | POA: Diagnosis not present

## 2019-02-27 DIAGNOSIS — I5032 Chronic diastolic (congestive) heart failure: Secondary | ICD-10-CM | POA: Diagnosis not present

## 2019-02-27 DIAGNOSIS — E7800 Pure hypercholesterolemia, unspecified: Secondary | ICD-10-CM | POA: Diagnosis not present

## 2019-02-27 DIAGNOSIS — M62830 Muscle spasm of back: Secondary | ICD-10-CM | POA: Diagnosis not present

## 2019-02-27 DIAGNOSIS — E1169 Type 2 diabetes mellitus with other specified complication: Secondary | ICD-10-CM | POA: Diagnosis not present

## 2019-02-27 DIAGNOSIS — D649 Anemia, unspecified: Secondary | ICD-10-CM | POA: Diagnosis not present

## 2019-02-27 DIAGNOSIS — N184 Chronic kidney disease, stage 4 (severe): Secondary | ICD-10-CM | POA: Diagnosis not present

## 2019-02-27 DIAGNOSIS — R251 Tremor, unspecified: Secondary | ICD-10-CM | POA: Diagnosis not present

## 2019-02-27 DIAGNOSIS — K219 Gastro-esophageal reflux disease without esophagitis: Secondary | ICD-10-CM | POA: Diagnosis not present

## 2019-02-28 DIAGNOSIS — E1140 Type 2 diabetes mellitus with diabetic neuropathy, unspecified: Secondary | ICD-10-CM | POA: Diagnosis not present

## 2019-02-28 DIAGNOSIS — K8020 Calculus of gallbladder without cholecystitis without obstruction: Secondary | ICD-10-CM | POA: Diagnosis not present

## 2019-02-28 DIAGNOSIS — N183 Chronic kidney disease, stage 3 unspecified: Secondary | ICD-10-CM | POA: Diagnosis not present

## 2019-02-28 DIAGNOSIS — M1990 Unspecified osteoarthritis, unspecified site: Secondary | ICD-10-CM | POA: Diagnosis not present

## 2019-02-28 DIAGNOSIS — I5032 Chronic diastolic (congestive) heart failure: Secondary | ICD-10-CM | POA: Diagnosis not present

## 2019-02-28 DIAGNOSIS — E785 Hyperlipidemia, unspecified: Secondary | ICD-10-CM | POA: Diagnosis not present

## 2019-02-28 DIAGNOSIS — K219 Gastro-esophageal reflux disease without esophagitis: Secondary | ICD-10-CM | POA: Diagnosis not present

## 2019-02-28 DIAGNOSIS — I130 Hypertensive heart and chronic kidney disease with heart failure and stage 1 through stage 4 chronic kidney disease, or unspecified chronic kidney disease: Secondary | ICD-10-CM | POA: Diagnosis not present

## 2019-02-28 DIAGNOSIS — G4730 Sleep apnea, unspecified: Secondary | ICD-10-CM | POA: Diagnosis not present

## 2019-02-28 DIAGNOSIS — D631 Anemia in chronic kidney disease: Secondary | ICD-10-CM | POA: Diagnosis not present

## 2019-02-28 DIAGNOSIS — E1122 Type 2 diabetes mellitus with diabetic chronic kidney disease: Secondary | ICD-10-CM | POA: Diagnosis not present

## 2019-02-28 DIAGNOSIS — M1A30X0 Chronic gout due to renal impairment, unspecified site, without tophus (tophi): Secondary | ICD-10-CM | POA: Diagnosis not present

## 2019-02-28 DIAGNOSIS — J45909 Unspecified asthma, uncomplicated: Secondary | ICD-10-CM | POA: Diagnosis not present

## 2019-03-05 DIAGNOSIS — R05 Cough: Secondary | ICD-10-CM | POA: Diagnosis not present

## 2019-03-17 ENCOUNTER — Encounter: Payer: PPO | Admitting: Neurology

## 2019-03-18 ENCOUNTER — Other Ambulatory Visit: Payer: Self-pay

## 2019-03-18 ENCOUNTER — Encounter: Payer: Self-pay | Admitting: Cardiovascular Disease

## 2019-03-18 ENCOUNTER — Ambulatory Visit (INDEPENDENT_AMBULATORY_CARE_PROVIDER_SITE_OTHER): Payer: PPO | Admitting: Cardiovascular Disease

## 2019-03-18 VITALS — BP 145/74 | HR 90 | Ht 68.0 in | Wt 234.0 lb

## 2019-03-18 DIAGNOSIS — I5043 Acute on chronic combined systolic (congestive) and diastolic (congestive) heart failure: Secondary | ICD-10-CM | POA: Diagnosis not present

## 2019-03-18 DIAGNOSIS — I10 Essential (primary) hypertension: Secondary | ICD-10-CM | POA: Diagnosis not present

## 2019-03-18 DIAGNOSIS — Z01810 Encounter for preprocedural cardiovascular examination: Secondary | ICD-10-CM | POA: Diagnosis not present

## 2019-03-18 DIAGNOSIS — Z720 Tobacco use: Secondary | ICD-10-CM | POA: Diagnosis not present

## 2019-03-18 MED ORDER — CLONIDINE HCL 0.1 MG PO TABS: 0.1000 mg | ORAL_TABLET | Freq: Two times a day (BID) | ORAL | 11 refills | Status: DC

## 2019-03-18 NOTE — Patient Instructions (Signed)
Medication Instructions:  START CLONIDINE 0.1MG  TWICE DAILY *If you need a refill on your cardiac medications before your next appointment, please call your pharmacy*  Lab Work: NONE NEEDED  Testing/Procedures: NONE NEEDED  Follow-Up: At [facility], you and your health needs are our priority.  As part of our continuing mission to provide you with exceptional heart care, we have created designated Provider Care Teams.  These Care Teams include your primary Cardiologist (physician) and Advanced Practice Providers (APPs -  Physician Assistants and Nurse Practitioners) who all work together to provide you with the care you need, when you need it.  Your next appointment:   6-8 week(s)  The format for your next appointment:   In Person  Provider:   Skeet Latch, MD

## 2019-03-18 NOTE — Progress Notes (Signed)
Cardiology Office Note   Date:  03/23/2019   ID:  Zoe Clarke, DOB 1951/03/13, MRN [id number]  PCP:  Antony Contras, MD  Cardiologist:   Skeet Latch, MD   No chief complaint on file.     History of Present Illness: Zoe Clarke is a 68 y.o. female with chronic diastolic heart failure, diabetes, hypertension CKD IV, and asthma here for follow up.  She was admitted 03/2018 with nausea and abdominal pain at which time she was found to have calculus cholecystitis and outpatient follow-up with surgery was recommended.  Surgery recommended cardiology clearance prior to cholecystectomy.  She had a CT of the abdomen 04/10/2018 that revealed changes suggestive of right-sided focal pyelonephritis with cholelithiasis.  She was then admitted 04/2018 with chest pain.  In the ED she was noted to have pulmonary edema on chest x-ray and there was no improvement with nebulizer treatments.  Oxygen saturation was 82% on room air.  BNP was 630.  Cardiac enzymes were negative.  She had an echo that admission that revealed LVEF 60 to 65% and grade 1 diastolic dysfunction.  Her echo was otherwise unremarkable.  RA pressure was 8 mmHg.  Symptoms improved with diuresis despite acute on chronic renal failure (creatinine increased from 1.8 to 2.2).  She was started on Imdur and hydralazine. CXR was concerning for RLL infiltrate and she was treated for CAP.  During hospitalization she was noted to have metabolic encephalopathy in the setting of polypharmacy.  Gabapentin was reduced and muscle relaxer, Xanax and Percocet were discontinued.  However she continues to take these medications. She was diagnosed with diabetes in 2003.  She has had hypertension all of her adult life.    She had a colonoscopy 10/2015.  The following day she had an episode of syncope in the setting of taking her BP medication during bowel prep.  She was admitted 09/2018 for acute on chronic diastolic heart failure.  She diuresed 3 L  during that hospitalization.  Her home carvedilol and hydralazine were both increased.  That hospitalization was complicated by hypertensive urgency and acute on chronic renal failure.  She followed up with Coletta Memos, NP on 10/23/2018 for preoperative clearance prior to gallbladder surgery.  At that time there was some misunderstandings about how she should be taking her home medications.  She had not been taking hydralazine or Lasix properly.  Her blood pressure at that time was 228/94.  Since that time she was admitted to th hospital again with acute on chronic heart failure. She was diuresed and Lasix was increased to 40 mg twice daily.  At discharge her creatinine was up to 3.3.  Since discharge she has been feeling well.  She was admitted 01/2019 with heart failure and chest pain.  High sensitivity troponin was negative.  She was diuresed.  She had abdominal pain and HIDA was negative for acute cholecystitis.    Zoe Clarke reports pain in her left side that started yesterday.  It feels like she pulled a muscle.  It is worse with movement.  She strained with a bowel movement but denies any strenuous activity.  She wonders if this is the cause.  She has been working to limit her salt and fluid intake.  She is trying to drink more water and avoid sweet beverages.  She hasn't been walking due to cold weather but hopes to start exercising soon.  Her BP has been running high, in the 150s-200s/70-110s.  She also  Complains  of foot numbness.  She has no chestpain and her breathing has been stable.  She denies lower extremity edema, orhtopnea or PND.   Past Medical History:  Diagnosis Date  . Allergy   . Arthritis   . Asthma    weazing at night uses inhalers  . Bronchitis    history of  . Chronic diastolic CHF (congestive heart failure) ([place]) 04/2018  . CKD stage 4 due to type 2 diabetes mellitus ([place])   . Diabetes mellitus without complication ([place])   . Gastric ulcer   . GERD (gastroesophageal  reflux disease)   . History of hiatal hernia   . Hypertension   . Osteoporosis   . Sleep apnea 09/22/2018    Past Surgical History:  Procedure Laterality Date  . DILATION AND CURETTAGE OF UTERUS    . EYE SURGERY Left    removed cataract w/ lens  . TUBAL LIGATION       Current Outpatient Medications  Medication Sig Dispense Refill  . albuterol (PROVENTIL HFA;VENTOLIN HFA) 108 (90 Base) MCG/ACT inhaler Inhale 2 puffs into the lungs every 6 (six) hours as needed for wheezing or shortness of breath. 1 Inhaler 0  . atorvastatin (LIPITOR) 40 MG tablet Take 40 mg by mouth daily.    . cyclobenzaprine (FLEXERIL) 5 MG tablet Take 5-10 mg by mouth daily.     . diclofenac Sodium (VOLTAREN) 1 % GEL Apply 2 g topically 4 (four) times daily. 50 g 0  . ergocalciferol (VITAMIN D2) 1.25 MG (50000 UT) capsule Take 50,000 Units by mouth every Friday.     . fexofenadine (ALLEGRA) 180 MG tablet Take 180 mg by mouth daily as needed for allergies.     . furosemide (LASIX) 80 MG tablet Take 1 tablet (80 mg total) by mouth 2 (two) times daily. 60 tablet 0  . gabapentin (NEURONTIN) 300 MG capsule Take 1 capsule (300 mg total) by mouth 2 (two) times daily. (Patient taking differently: Take 300 mg by mouth. Pt states taking 300mg  - 3 po tid) 60 capsule 0  . glipiZIDE (GLUCOTROL XL) 10 MG 24 hr tablet Take 10 mg by mouth daily with breakfast.     . hydrALAZINE (APRESOLINE) 100 MG tablet Take 1 tablet (100 mg total) by mouth 3 (three) times daily. 90 tablet 0  . hydrocortisone cream 1 % Apply topically 2 (two) times daily. Areas of itch 30 g 0  . isosorbide mononitrate (IMDUR) 30 MG 24 hr tablet Take 3 tablets (90 mg total) by mouth daily. 90 tablet 0  . linaclotide (LINZESS) 145 MCG CAPS capsule Take 145 mcg by mouth daily as needed (constipation).     . metoprolol succinate (TOPROL-XL) 50 MG 24 hr tablet Take 1 tablet (50 mg total) by mouth daily. Take with or immediately following a meal. 30 tablet 0  . Multiple  Vitamin (MULTIVITAMIN WITH MINERALS) TABS tablet Take 1 tablet by mouth daily.    Marland Kitchen oxyCODONE-acetaminophen (PERCOCET/ROXICET) 5-325 MG tablet Take 0.5 tablets by mouth every 6 (six) hours as needed for moderate pain or severe pain.     . pantoprazole (PROTONIX) 40 MG tablet Take 1 tablet (40 mg total) by mouth daily. 30 tablet 11  . polyethylene glycol (MIRALAX / GLYCOLAX) packet Take 17 g by mouth daily as needed for mild constipation. 14 each 0  . triamcinolone cream (KENALOG) 0.1 % Apply 1 application topically daily as needed (itching on back).     . triamcinolone ointment (KENALOG) 0.1 % Apply 1  application topically 2 (two) times daily. Gel on inside of ear    . cloNIDine (CATAPRES) 0.1 MG tablet Take 1 tablet (0.1 mg total) by mouth 2 (two) times daily. 60 tablet 11   No current facility-administered medications for this visit.    Allergies:   Aspirin, Allopurinol, Amlodipine, Carvedilol, Lisinopril, and Other    Social History:  The patient  reports that she has been smoking cigarettes. She has a 19.50 pack-year smoking history. She has never used smokeless tobacco. She reports previous drug use. Drug: Marijuana. She reports that she does not drink alcohol.   Family History:  The patient's family history includes Arthritis in her maternal grandmother and mother; Cancer in her sister; Emphysema in her mother; Heart failure in her mother, sister, and sister; Hypertension in her maternal grandmother and mother; Lung disease in her sister; Lupus in her child; Stroke in her maternal grandfather.    ROS:  Please see the history of present illness.   Otherwise, review of systems are positive for none.   All other systems are reviewed and negative.    PHYSICAL EXAM: VS:  BP (!) 145/74   Pulse 90   Ht 5\' 8"  (1.727 m)   Wt 234 lb (106.1 kg)   SpO2 100%   BMI 35.58 kg/m  , BMI Body mass index is 35.58 kg/m. GENERAL:  Well appearing HEENT:  Pupils equal round and reactive, fundi not  visualized, oral mucosa unremarkable NECK:  No jugular venous distention, waveform within normal limits, carotid upstroke brisk and symmetric, no bruits, no thyromegaly LYMPHATICS:  No cervical adenopathy LUNGS:  Clear to auscultation bilaterally HEART:  RRR.  PMI not displaced or sustained,S1 and S2 within normal limits, no S3, no S4, no clicks, no rubs, no murmurs ABD:  Flat, positive bowel sounds normal in frequency in pitch, no bruits, no rebound, no guarding, no midline pulsatile mass, no hepatomegaly, no splenomegaly EXT:  2 plus pulses throughout, no edema, no cyanosis no clubbing SKIN:  No rashes no nodules NEURO:  Cranial nerves II through XII grossly intact, motor grossly intact throughout PSYCH:  Cognitively intact, oriented to person place and time   EKG:  EKG is ordered today. The ekg ordered today demonstrates sinus rhythm rate 90 bpm.   03/18/19: Sinus rhythm.  Rate 90 bpm.  LVH.    Echo 04/28/18: IMPRESSIONS  1. The left ventricle has normal systolic function of 85-02%. The cavity size was normal. There is mildly increased left ventricular wall thickness. Echo evidence of impaired diastolic relaxation Elevated mean left atrial pressure. 2. The right ventricle has normal systolic function. The cavity was normal. There is no increase in right ventricular wall thickness. 3. Left atrial size was mildly dilated. 4. The mitral valve is normal in structure. There is mild thickening. 5. The tricuspid valve is normal in structure. 6. The aortic valve is normal in structure. Aortic valve regurgitation is mild by color flow Doppler. 7. The inferior vena cava was dilated in size with >50% respiratory variability.  Recent Labs: 02/13/2019: B Natriuretic Peptide 2,250.1; Magnesium 2.4; TSH 1.872 02/16/2019: ALT 178 02/17/2019: Hemoglobin 8.9; Platelets 224 02/18/2019: BUN 42; Creatinine, Ser 3.11; Potassium 4.0; Sodium 138    Lipid Panel    Component Value Date/Time   CHOL  103 12/20/2018 1739   TRIG 68 12/20/2018 1739   HDL 35 (L) 12/20/2018 1739   CHOLHDL 2.9 12/20/2018 1739   VLDL 14 12/20/2018 1739   LDLCALC 54 12/20/2018 1739  Wt Readings from Last 3 Encounters:  03/18/19 234 lb (106.1 kg)  02/23/19 244 lb 9.6 oz (110.9 kg)  02/18/19 243 lb 9.7 oz (110.5 kg)      ASSESSMENT AND PLAN:  # Chronic systolic and diastolic heart failure: # Essential hypertension: Zoe Clarke has diastolic heart failure.  She has been hospitalized multiple times.  This is at least partially due to poorly controlled blood pressure.  We talked about the importance of controlling her blood pressure.  BP remains poorly controlled.  She previously tolerated clonidine.  We will start clonidine 0.1mg  bid.  She was on carvedilol but this was switched to metoprolol due to wheezing and COPD.  We discussed limiting salt and fluid intake.   # Hyperlipidemia: Continue atorvastatin.  LDL 55 on 09/2018.  # Tobacco abuse: Zoe Clarke was encouraged to quit smoking.  She remains uninterested in quitting at this time.   # Pre-surgical risk assessment: Lexiscan Myoview 07/2018 was negative for ischemia.  She has no ischemic symptoms.  Her main surgical risks are related to poorly controlled hypertension and acute on chronic renal failure.  BMP pending.  She will track her BP and if renal function and BP are stable at follow up she will be at acceptable risk for surgery.  # Likely OSA:  Patient has snoring, apnea and tired upon awakening.  She will need a sleep study.      Current medicines are reviewed at length with the patient today.  The patient does not have concerns regarding medicines.  The following changes have been made: add clonidine 0.1mg  bid  Labs/ tests ordered today include:   Orders Placed This Encounter  Procedures  . EKG 12-Lead     Disposition:   FU with Soffia Doshier C. Oval Linsey, MD, [facility] in 6-8 weeks     Signed, [place] Oval Linsey, MD, [facility]    03/23/2019 11:02 PM    [place]

## 2019-03-23 ENCOUNTER — Encounter: Payer: Self-pay | Admitting: Cardiovascular Disease

## 2019-03-24 DIAGNOSIS — Z9181 History of falling: Secondary | ICD-10-CM | POA: Diagnosis not present

## 2019-03-24 DIAGNOSIS — F1210 Cannabis abuse, uncomplicated: Secondary | ICD-10-CM | POA: Diagnosis not present

## 2019-03-24 DIAGNOSIS — E119 Type 2 diabetes mellitus without complications: Secondary | ICD-10-CM | POA: Diagnosis not present

## 2019-03-24 DIAGNOSIS — K8020 Calculus of gallbladder without cholecystitis without obstruction: Secondary | ICD-10-CM | POA: Diagnosis not present

## 2019-03-24 DIAGNOSIS — F17210 Nicotine dependence, cigarettes, uncomplicated: Secondary | ICD-10-CM | POA: Diagnosis not present

## 2019-03-24 DIAGNOSIS — Z7984 Long term (current) use of oral hypoglycemic drugs: Secondary | ICD-10-CM | POA: Diagnosis not present

## 2019-03-24 DIAGNOSIS — I509 Heart failure, unspecified: Secondary | ICD-10-CM | POA: Diagnosis not present

## 2019-03-31 ENCOUNTER — Emergency Department (HOSPITAL_COMMUNITY)
Admission: EM | Admit: 2019-03-31 | Discharge: 2019-03-31 | Disposition: A | Discharge status: Home / Self Care | Payer: PPO | Attending: Emergency Medicine | Admitting: Emergency Medicine

## 2019-03-31 ENCOUNTER — Encounter (HOSPITAL_COMMUNITY): Payer: Self-pay

## 2019-03-31 ENCOUNTER — Emergency Department (HOSPITAL_COMMUNITY): Payer: PPO

## 2019-03-31 DIAGNOSIS — E1122 Type 2 diabetes mellitus with diabetic chronic kidney disease: Secondary | ICD-10-CM | POA: Insufficient documentation

## 2019-03-31 DIAGNOSIS — N184 Chronic kidney disease, stage 4 (severe): Secondary | ICD-10-CM | POA: Insufficient documentation

## 2019-03-31 DIAGNOSIS — Y92019 Unspecified place in single-family (private) house as the place of occurrence of the external cause: Secondary | ICD-10-CM | POA: Diagnosis not present

## 2019-03-31 DIAGNOSIS — J45909 Unspecified asthma, uncomplicated: Secondary | ICD-10-CM | POA: Insufficient documentation

## 2019-03-31 DIAGNOSIS — M25561 Pain in right knee: Secondary | ICD-10-CM | POA: Diagnosis not present

## 2019-03-31 DIAGNOSIS — W010XXA Fall on same level from slipping, tripping and stumbling without subsequent striking against object, initial encounter: Secondary | ICD-10-CM | POA: Insufficient documentation

## 2019-03-31 DIAGNOSIS — Z79899 Other long term (current) drug therapy: Secondary | ICD-10-CM | POA: Insufficient documentation

## 2019-03-31 DIAGNOSIS — Y999 Unspecified external cause status: Secondary | ICD-10-CM | POA: Diagnosis not present

## 2019-03-31 DIAGNOSIS — I5043 Acute on chronic combined systolic (congestive) and diastolic (congestive) heart failure: Secondary | ICD-10-CM | POA: Insufficient documentation

## 2019-03-31 DIAGNOSIS — S79911A Unspecified injury of right hip, initial encounter: Secondary | ICD-10-CM | POA: Diagnosis not present

## 2019-03-31 DIAGNOSIS — M25551 Pain in right hip: Secondary | ICD-10-CM | POA: Diagnosis not present

## 2019-03-31 DIAGNOSIS — Y9301 Activity, walking, marching and hiking: Secondary | ICD-10-CM | POA: Diagnosis not present

## 2019-03-31 DIAGNOSIS — W19XXXA Unspecified fall, initial encounter: Secondary | ICD-10-CM | POA: Diagnosis not present

## 2019-03-31 DIAGNOSIS — R52 Pain, unspecified: Secondary | ICD-10-CM | POA: Diagnosis not present

## 2019-03-31 DIAGNOSIS — Z7984 Long term (current) use of oral hypoglycemic drugs: Secondary | ICD-10-CM | POA: Diagnosis not present

## 2019-03-31 DIAGNOSIS — I130 Hypertensive heart and chronic kidney disease with heart failure and stage 1 through stage 4 chronic kidney disease, or unspecified chronic kidney disease: Secondary | ICD-10-CM | POA: Insufficient documentation

## 2019-03-31 DIAGNOSIS — F17210 Nicotine dependence, cigarettes, uncomplicated: Secondary | ICD-10-CM | POA: Diagnosis not present

## 2019-03-31 NOTE — ED Triage Notes (Signed)
Pt BIBA from home. Pt c/o fall in bedroom, falling on knees, C/o right knee and hip pain. No LOC, no neck pain, no deformities.

## 2019-03-31 NOTE — ED Provider Notes (Signed)
[facility] DEPT Provider Note   CSN: [id number] Arrival date & time: 03/31/19  1107     History Chief Complaint  Patient presents with  . Fall    Zoe Clarke is a 69 y.o. female.  She is brought in by ambulance from home after a fall.  She says her right leg gave out.  She is complaining of right hip and knee pain.  It sounds like these pains have been going on for years.  She said when her leg gave out her leg was numb.  No head injury or loss of consciousness.  No neck or back pain.  She said she is waiting on a wheelchair but her doctor has not submitted the right paperwork yet.  The history is provided by the patient.  Fall This is a new problem. The current episode started 1 to 2 hours ago. The problem occurs rarely. The problem has not changed since onset.Pertinent negatives include no chest pain, no abdominal pain, no headaches and no shortness of breath. The symptoms are aggravated by bending and walking. Nothing relieves the symptoms. She has tried nothing for the symptoms. The treatment provided no relief.       Past Medical History:  Diagnosis Date  . Allergy   . Arthritis   . Asthma    weazing at night uses inhalers  . Bronchitis    history of  . Chronic diastolic CHF (congestive heart failure) ([place]) 04/2018  . CKD stage 4 due to type 2 diabetes mellitus ([place])   . Diabetes mellitus without complication ([place])   . Gastric ulcer   . GERD (gastroesophageal reflux disease)   . History of hiatal hernia   . Hypertension   . Osteoporosis   . Sleep apnea 09/22/2018    Patient Active Problem List   Diagnosis Date Noted  . Acute on chronic congestive heart failure ([place]) 02/13/2019  . Hyperkalemia 02/13/2019  . Elevated liver enzymes 02/13/2019  . Atelectasis 12/22/2018  . CAP (community acquired pneumonia) 12/20/2018  . Obesity, Class III, BMI 40-49.9 (morbid obesity) ([place]) 12/20/2018  . Marijuana abuse 12/20/2018  . Dysuria  12/20/2018  . Chronic diastolic CHF (congestive heart failure) ([place]) 11/06/2018  . 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 Thyroid nodule 09/22/2018  . Anemia, chronic renal failure, stage 3 (moderate) 09/22/2018  . Sleep apnea 09/22/2018  . Precordial chest pain   . Cholelithiasis 07/30/2018  . Acute on chronic combined systolic (congestive) and diastolic (congestive) heart failure ([place]) 07/29/2018  . Dyspnea 07/29/2018  . Tobacco abuse counseling 04/27/2018  . Acute pyelonephritis 04/10/2018  . Hypertensive urgency 04/10/2018  . Leukocytosis 09/10/2017  . Anemia of chronic disease 09/10/2017  . Knee pain, bilateral 01/13/2015  . Bilateral hip pain 11/23/2014  . Diabetes mellitus ([place]) 10/25/2014  . Intertrigo 10/25/2014  . GERD (gastroesophageal reflux disease) 10/25/2014  . Diabetic neuropathy ([place]) 10/25/2014  . Chronic low back pain 10/25/2014  . Tobacco abuse 11/11/2013  . CKD (chronic kidney disease), stage III 09/02/2013  . Gout 12/05/2012    Past Surgical History:  Procedure Laterality Date  . DILATION AND CURETTAGE OF UTERUS    . EYE SURGERY Left    removed cataract w/ lens  . TUBAL LIGATION       OB History   No obstetric history on file.     Family History  Problem Relation Age  of Onset  . Arthritis Mother   . Hypertension Mother   . Heart failure Mother   . Emphysema Mother   . Arthritis Maternal Grandmother   . Hypertension Maternal Grandmother   . Stroke Maternal Grandfather   . Cancer Sister        unknown type cancer  . Heart failure Sister   . Lung disease Sister   . Heart failure Sister   . Lupus Child   . Colon cancer Neg Hx   . Esophageal cancer Neg Hx   . Stomach cancer Neg Hx   . Rectal cancer Neg Hx     Social History   Tobacco Use  . Smoking status: Current Every Day Smoker    Packs/day: 0.50    Years: 39.00     Pack years: 19.50    Types: Cigarettes  . Smokeless tobacco: Never Used  . Tobacco comment: 1 pack q 3-4 days  Substance Use Topics  . Alcohol use: No    Alcohol/week: 0.0 standard drinks  . Drug use: Not Currently    Types: Marijuana    Home Medications Prior to Admission medications   Medication Sig Start Date End Date Taking? Authorizing Provider  albuterol (PROVENTIL HFA;VENTOLIN HFA) 108 (90 Base) MCG/ACT inhaler Inhale 2 puffs into the lungs every 6 (six) hours as needed for wheezing or shortness of breath. 04/14/18   Bonnell Public, MD  atorvastatin (LIPITOR) 40 MG tablet Take 40 mg by mouth daily.    [provider]  cloNIDine (CATAPRES) 0.1 MG tablet Take 1 tablet (0.1 mg total) by mouth 2 (two) times daily. 03/18/19   Skeet Latch, MD  cyclobenzaprine (FLEXERIL) 5 MG tablet Take 5-10 mg by mouth daily.     [provider]  diclofenac Sodium (VOLTAREN) 1 % GEL Apply 2 g topically 4 (four) times daily. 02/18/19   Geradine Girt, DO  ergocalciferol (VITAMIN D2) 1.25 MG (50000 UT) capsule Take 50,000 Units by mouth every Friday.     [provider]  fexofenadine (ALLEGRA) 180 MG tablet Take 180 mg by mouth daily as needed for allergies.     [provider]  furosemide (LASIX) 80 MG tablet Take 1 tablet (80 mg total) by mouth 2 (two) times daily. 02/18/19   Geradine Girt, DO  gabapentin (NEURONTIN) 300 MG capsule Take 1 capsule (300 mg total) by mouth 2 (two) times daily. Patient taking differently: Take 300 mg by mouth. Pt states taking 300mg  - 3 po tid 02/18/19   Eulogio Bear U, DO  glipiZIDE (GLUCOTROL XL) 10 MG 24 hr tablet Take 10 mg by mouth daily with breakfast.     [provider]  hydrALAZINE (APRESOLINE) 100 MG tablet Take 1 tablet (100 mg total) by mouth 3 (three) times daily. 02/18/19   Geradine Girt, DO  hydrocortisone cream 1 % Apply topically 2 (two) times daily. Areas of itch 09/30/18   Pokhrel, Corrie Mckusick, MD    isosorbide mononitrate (IMDUR) 30 MG 24 hr tablet Take 3 tablets (90 mg total) by mouth daily. 02/19/19   Geradine Girt, DO  linaclotide (LINZESS) 145 MCG CAPS capsule Take 145 mcg by mouth daily as needed (constipation).     [provider]  metoprolol succinate (TOPROL-XL) 50 MG 24 hr tablet Take 1 tablet (50 mg total) by mouth daily. Take with or immediately following a meal. 02/19/19   Geradine Girt, DO  Multiple Vitamin (MULTIVITAMIN WITH MINERALS) TABS tablet Take 1 tablet by  mouth daily.    [provider]  oxyCODONE-acetaminophen (PERCOCET/ROXICET) 5-325 MG tablet Take 0.5 tablets by mouth every 6 (six) hours as needed for moderate pain or severe pain.     [provider]  pantoprazole (PROTONIX) 40 MG tablet Take 1 tablet (40 mg total) by mouth daily. 07/14/18   Skeet Latch, MD  polyethylene glycol [facility] / [name] packet Take 17 g by mouth daily as needed for mild constipation. 04/14/18   Dana Allan I, MD  triamcinolone cream (KENALOG) 0.1 % Apply 1 application topically daily as needed (itching on back).     [provider]  triamcinolone ointment (KENALOG) 0.1 % Apply 1 application topically 2 (two) times daily. Gel on inside of ear    [provider]    Allergies    Aspirin, Allopurinol, Amlodipine, Carvedilol, Lisinopril, and Other  Review of Systems   Review of Systems  Constitutional: Negative for fever.  HENT: Negative for sore throat.   Eyes: Negative for visual disturbance.  Respiratory: Negative for shortness of breath.   Cardiovascular: Negative for chest pain.  Gastrointestinal: Negative for abdominal pain.  Genitourinary: Negative for dysuria.  Musculoskeletal: Positive for gait problem.  Skin: Negative for rash.  Neurological: Negative for headaches.    Physical Exam Updated Vital Signs BP (!) 191/83 Comment: pt did not take bp meds  Pulse 88   Temp 98.1 F (36.7 C)   Resp 16   SpO2 100%    Physical Exam Vitals and nursing note reviewed.  Constitutional:      General: She is not in acute distress.    Appearance: She is well-developed.  HENT:     Head: Normocephalic and atraumatic.  Eyes:     Conjunctiva/sclera: Conjunctivae normal.  Cardiovascular:     Rate and Rhythm: Normal rate and regular rhythm.     Heart sounds: No murmur.  Pulmonary:     Effort: Pulmonary effort is normal. No respiratory distress.     Breath sounds: Normal breath sounds.  Abdominal:     Palpations: Abdomen is soft.     Tenderness: There is no abdominal tenderness.  Musculoskeletal:        General: Tenderness present. No deformity. Normal range of motion.     Cervical back: Neck supple.     Comments: Patient has some diffuse tenderness around her right hip and right knee.  There is no obvious shortening or deformity.  No warmth.  No open wounds.  She was able to stand and transfer with assistance.  Skin:    General: Skin is warm and dry.     Capillary Refill: Capillary refill takes less than 2 seconds.  Neurological:     General: No focal deficit present.     Mental Status: She is alert.     ED Results / Procedures / Treatments   Labs (all labs ordered are listed, but only abnormal results are displayed) Labs Reviewed - No data to display  EKG None  Radiology DG Knee Complete 4 Views Right  Result Date: 03/31/2019 CLINICAL DATA:  Right knee pain secondary to a fall in the bedroom at home. EXAM: RIGHT KNEE - COMPLETE 4+ VIEW COMPARISON:  Radiographs dated 01/07/2017 FINDINGS: There is no fracture or dislocation or joint effusion. There is moderately severe tricompartmental osteoarthritis. Prominent marginal osteophytes. Arterial calcifications around the knee. IMPRESSION: No acute abnormality. Tricompartmental osteoarthritis. Electronically Signed   By: Lorriane Shire M.D.   On: 03/31/2019 12:32   DG Hip Unilat With  Pelvis 2-3 Views Right  Result Date: 03/31/2019 CLINICAL DATA:   Right knee and hip pain secondary to a fall at home in the bedroom. EXAM: DG HIP (WITH OR WITHOUT PELVIS) 2-3V RIGHT COMPARISON:  Radiographs dated 01/07/2017 FINDINGS: There is chronic severe arthritis of the right hip with similar severe arthritic changes of the left hip. No hip or pelvic fractures. IMPRESSION: No acute abnormality. Severe chronic arthritis of the right hip. Severe arthritis of the left hip. Electronically Signed   By: Lorriane Shire M.D.   On: 03/31/2019 12:29    Procedures Procedures (including critical care time)  Medications Ordered in ED Medications - No data to display  ED Course  I have reviewed the triage vital signs and the nursing notes.  Pertinent labs & imaging results that were available during my care of the patient were reviewed by me and considered in my medical decision making (see chart for details).  Clinical Course as of Mar 31 1731  Tue Mar 30, 1864  7545 69 year old female history of arthritis and joint problems here after her leg gave out and she fell.  She is complaining of some vague right hip and right knee pain.  Unclear if this is her typical pain or more related to the injury.  She has no limitations with range of motion although does have some discomfort.  Differential includes fracture, dislocation, contusion   [MB]  1233 Pelvis and right hip x-ray interpreted by me as no gross fractures.  She does have a lot of arthritic changes.  Patient's right knee x-ray also does not show any acute fractures but a lot of arthritic changes.   [MB]    Clinical Course User Index [MB] Hayden Rasmussen, MD   MDM Rules/Calculators/A&P                     Discussed with social work who will arrange for home services.   Final Clinical Impression(s) / ED Diagnoses Final diagnoses:  Fall, initial encounter  Right hip pain  Acute pain of right knee    Rx / DC Orders ED Discharge Orders    None       Hayden Rasmussen, MD 03/31/19 1734

## 2019-03-31 NOTE — ED Notes (Signed)
Unable to determine who pt's legal guardian is at this time.

## 2019-03-31 NOTE — TOC Initial Note (Signed)
Transition of Care [facility]) - Initial/Assessment Note    Patient Details  Name: Zoe Clarke MRN: [id number] Date of Birth: 1950/11/01  Transition of Care [facility]) CM/SW Contact:    Erenest Rasher, RN Phone Number: [phone] 03/31/2019, 7:45 PM  Clinical Narrative:                 TOC CM spoke to pt's husband, Gwyndolyn Saxon. States she has a wheelchair at home. States they have been working with PCP to secure an Clinical research associate. She also has RW and bedside commode. Offered choice for [facility]. Agreeable to [facility]. Will call with new referral to [facility] rep. Husband states son assist them in the home as needed. Will follow up with PCP on electric wheelchair.   Expected Discharge Plan: [place] Barriers to Discharge: No Barriers Identified   Patient Goals and CMS Choice Patient states their goals for this hospitalization and ongoing recovery are:: needs electric wheelchair CMS Medicare.gov Compare Post Acute Care list provided to:: Patient Represenative (must comment)(William Eulas Post, husband) Choice offered to / list presented to : Spouse  Expected Discharge Plan and Services Expected Discharge Plan: [place] In-house Referral: Clinical Social Work Discharge Planning Services: CM Consult Post Acute Care Choice: [place] arrangements for the past 2 months: Single Family Home                           HH Arranged: RN, PT, OT, Nurse's Aide [place] Agency: [facility] Date [facility] Agency Contacted: 03/31/19 Time [place]: 1943 Representative spoke with at [place]: [place] Arrangements/Services Living arrangements for the past 2 months: [place] Lives with:: Spouse Patient language and need for interpreter reviewed:: Yes Do you feel safe going back to the place where you live?: Yes      Need for Family Participation in Patient Care: Yes (Comment) Care giver support system in place?: Yes  (comment) Current home services: DME(wheelchair, rolling walker, bedside commode) Criminal Activity/Legal Involvement Pertinent to Current Situation/Hospitalization: No - Comment as needed  Activities of Daily Living      Permission Sought/Granted Permission sought to share information with : Case Manager, PCP, Family Supports Permission granted to share information with : Yes, Verbal Permission Granted  Share Information with NAME: Anwen Cannedy  Permission granted to share info w AGENCY: [place] agencies  Permission granted to share info w Relationship: husband  Permission granted to share info w Contact Information: [phone]  Emotional Assessment   Attitude/Demeanor/Rapport: Engaged Affect (typically observed): Accepting Orientation: : Oriented to Self, Oriented to Place, Oriented to Situation   Psych Involvement: No (comment)  Admission diagnosis:  fall knee and hip pain Patient Active Problem List   Diagnosis Date Noted  . Acute on chronic congestive heart failure ([address]) 02/13/2019  . Hyperkalemia 02/13/2019  . Elevated liver enzymes 02/13/2019  . Atelectasis 12/22/2018  . CAP (community acquired pneumonia) 12/20/2018  . Obesity, Class III, BMI 40-49.9 (morbid obesity) ([place]) 12/20/2018  . Marijuana abuse 12/20/2018  . Dysuria 12/20/2018  . Chronic diastolic CHF (congestive heart failure) ([place]) 11/06/2018  . 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 Thyroid nodule 09/22/2018  . Anemia, chronic renal failure, stage 3 (moderate) 09/22/2018  . Sleep apnea 09/22/2018  . Precordial chest pain   . Cholelithiasis 07/30/2018  .  Acute on chronic combined systolic (congestive) and diastolic (congestive) heart failure ([place]) 07/29/2018  . Dyspnea 07/29/2018  . Tobacco abuse counseling 04/27/2018  . Acute pyelonephritis 04/10/2018  . Hypertensive urgency  04/10/2018  . Leukocytosis 09/10/2017  . Anemia of chronic disease 09/10/2017  . Knee pain, bilateral 01/13/2015  . Bilateral hip pain 11/23/2014  . Diabetes mellitus ([place]) 10/25/2014  . Intertrigo 10/25/2014  . GERD (gastroesophageal reflux disease) 10/25/2014  . Diabetic neuropathy ([place]) 10/25/2014  . Chronic low back pain 10/25/2014  . Tobacco abuse 11/11/2013  . CKD (chronic kidney disease), stage III 09/02/2013  . Gout 12/05/2012   PCP:  Antony Contras, MD Pharmacy:   [facility] [place] [address] [place] [address] Phone: [phone] Fax: [phone]     Social Determinants of Health (SDOH) Interventions    Readmission Risk Interventions Readmission Risk Prevention Plan 12/22/2018 11/10/2018 09/30/2018  Transportation Screening Complete Complete -  HRI or [place] - Complete Complete  Social Work Consult for [place] Planning/Counseling - Complete -  Palliative Care Screening - Not Applicable Not Applicable  Medication Review Press photographer) Complete Complete -  PCP or Specialist appointment within 3-5 days of discharge Complete - -  [place] or Home Care Consult Complete - -  Palliative Care Screening Not Applicable - -  [place] Not Applicable - -  Some recent data might be hidden

## 2019-03-31 NOTE — Discharge Instructions (Addendum)
You were seen in the emergency department for evaluation of injuries from a fall.  You had x-rays of your right hip and knee that did not show any obvious fractures.  Please continue your regular pain medication and follow-up with your primary care doctor for continued management of your arthritis.  Return to the emergency department if any concerns

## 2019-04-02 ENCOUNTER — Telehealth: Payer: Self-pay | Admitting: *Deleted

## 2019-04-02 NOTE — Telephone Encounter (Signed)
Referral sent to [place], [place] for [facility], waiting on confirmation. [place], [place] ED TOC CM [phone]

## 2019-04-20 ENCOUNTER — Telehealth: Payer: Self-pay | Admitting: *Deleted

## 2019-04-20 DIAGNOSIS — Z7984 Long term (current) use of oral hypoglycemic drugs: Secondary | ICD-10-CM | POA: Diagnosis not present

## 2019-04-20 DIAGNOSIS — F17210 Nicotine dependence, cigarettes, uncomplicated: Secondary | ICD-10-CM | POA: Diagnosis not present

## 2019-04-20 DIAGNOSIS — F1210 Cannabis abuse, uncomplicated: Secondary | ICD-10-CM | POA: Diagnosis not present

## 2019-04-20 DIAGNOSIS — E119 Type 2 diabetes mellitus without complications: Secondary | ICD-10-CM | POA: Diagnosis not present

## 2019-04-20 DIAGNOSIS — K8020 Calculus of gallbladder without cholecystitis without obstruction: Secondary | ICD-10-CM | POA: Diagnosis not present

## 2019-04-20 DIAGNOSIS — I509 Heart failure, unspecified: Secondary | ICD-10-CM | POA: Diagnosis not present

## 2019-04-20 DIAGNOSIS — Z9181 History of falling: Secondary | ICD-10-CM | POA: Diagnosis not present

## 2019-04-20 MED ORDER — DOXAZOSIN MESYLATE 4 MG PO TABS: 4.0000 mg | ORAL_TABLET | Freq: Every day | ORAL | 1 refills | Status: DC

## 2019-04-20 NOTE — Telephone Encounter (Signed)
Received message from [facility] that patient was not taking Clonidine as prescribed secondary to headaches. Dr Oval Linsey suggested to d/c Clonidine and Rx Doxazosin 4 mg daily. Orders filled out and faxed to [facility], Rx sent to

## 2019-04-29 ENCOUNTER — Other Ambulatory Visit: Payer: Self-pay

## 2019-04-29 ENCOUNTER — Ambulatory Visit: Payer: PPO | Admitting: Cardiovascular Disease

## 2019-04-29 ENCOUNTER — Encounter: Payer: Self-pay | Admitting: Cardiovascular Disease

## 2019-04-29 VITALS — BP 142/80 | HR 80 | Temp 98.0°F | Ht 68.0 in | Wt 230.0 lb

## 2019-04-29 DIAGNOSIS — I160 Hypertensive urgency: Secondary | ICD-10-CM

## 2019-04-29 DIAGNOSIS — I5023 Acute on chronic systolic (congestive) heart failure: Secondary | ICD-10-CM

## 2019-04-29 DIAGNOSIS — Z720 Tobacco use: Secondary | ICD-10-CM | POA: Diagnosis not present

## 2019-04-29 DIAGNOSIS — N1832 Chronic kidney disease, stage 3b: Secondary | ICD-10-CM | POA: Diagnosis not present

## 2019-04-29 NOTE — Progress Notes (Signed)
Cardiology Office Note   Date:  04/29/2019   ID:  Zoe, Clarke [birth year], MRN [id number]  PCP:  Antony Contras, MD  Cardiologist:   Skeet Latch, MD   Chief Complaint  Patient presents with  . Follow-up    6-8 weeks.  Marland Kitchen Headache  . Numbness    Left hand and right ankle.      History of Present Illness: Zoe Clarke is a 69 y.o. female with chronic diastolic heart failure, diabetes, hypertension CKD IV, and asthma here for follow up.  She was admitted 03/2018 with nausea and abdominal pain at which time she was found to have calculus cholecystitis and outpatient follow-up with surgery was recommended.  Surgery recommended cardiology clearance prior to cholecystectomy.  She had a CT of the abdomen 04/10/2018 that revealed changes suggestive of right-sided focal pyelonephritis with cholelithiasis.  She was then admitted 04/2018 with chest pain.  In the ED she was noted to have pulmonary edema on chest x-ray and there was no improvement with nebulizer treatments.  Oxygen saturation was 82% on room air.  BNP was 630.  Cardiac enzymes were negative.  She had an echo that admission that revealed LVEF 60 to 65% and grade 1 diastolic dysfunction.  Her echo was otherwise unremarkable.  RA pressure was 8 mmHg.  Symptoms improved with diuresis despite acute on chronic renal failure (creatinine increased from 1.8 to 2.2).  She was started on Imdur and hydralazine. CXR was concerning for RLL infiltrate and she was treated for CAP.  During hospitalization she was noted to have metabolic encephalopathy in the setting of polypharmacy.  Gabapentin was reduced and muscle relaxer, Xanax and Percocet were discontinued.  However she continues to take these medications. She was diagnosed with diabetes in 2003.  She has had hypertension all of her adult life.    She had a colonoscopy 10/2015.  The following day she had an episode of syncope in the setting of taking her BP medication during  bowel prep.  She was admitted 09/2018 for acute on chronic diastolic heart failure.  She diuresed 3 L during that hospitalization.  Her home carvedilol and hydralazine were both increased.  That hospitalization was complicated by hypertensive urgency and acute on chronic renal failure.  She followed up with Coletta Memos, NP on 10/23/2018 for preoperative clearance prior to gallbladder surgery.  At that time there was some misunderstandings about how she should be taking her home medications.  She had not been taking hydralazine or Lasix properly.  Her blood pressure at that time was 228/94.  Since that time she was admitted to th hospital again with acute on chronic heart failure. She was diuresed and Lasix was increased to 40 mg twice daily.  At discharge her creatinine was up to 3.3.  Since discharge she has been feeling well.  She was admitted 01/2019 with heart failure and chest pain.  High sensitivity troponin was negative.  She was diuresed.  She had abdominal pain and HIDA was negative for acute cholecystitis.    At her last appointment [name] is low pressure remained poorly controlled.  Clonidine was restarted.  However she had headaches so this was discontinued.  Doxazosin was ordered but she has not yet started it due to concern about side effects.  She notes her blood pressure continues to be elevated at home.  Yesterday was at high as 174 before taking her medications to get improved after taking them.  Today she had  an episode of dizziness upon standing.  She denies any syncope.  She did fall last month.  However this happened because her leg gave out from under her.  Overall she has been doing okay.  She has a home health nurse who comes and checks her and says that she is doing well.  Her breathing has been stable and she has no chest pain.  She denies lower extremity edema, orthopnea, or PND.  She is still eager to have her gallbladder surgery   Past Medical History:  Diagnosis Date  .  Allergy   . Arthritis   . Asthma    weazing at night uses inhalers  . Bronchitis    history of  . Chronic diastolic CHF (congestive heart failure) ([place]) 04/2018  . CKD stage 4 due to type 2 diabetes mellitus ([place])   . Diabetes mellitus without complication ([place])   . Gastric ulcer   . GERD (gastroesophageal reflux disease)   . History of hiatal hernia   . Hypertension   . Osteoporosis   . Sleep apnea 09/22/2018    Past Surgical History:  Procedure Laterality Date  . DILATION AND CURETTAGE OF UTERUS    . EYE SURGERY Left    removed cataract w/ lens  . TUBAL LIGATION       Current Outpatient Medications  Medication Sig Dispense Refill  . albuterol (PROVENTIL HFA;VENTOLIN HFA) 108 (90 Base) MCG/ACT inhaler Inhale 2 puffs into the lungs every 6 (six) hours as needed for wheezing or shortness of breath. 1 Inhaler 0  . atorvastatin (LIPITOR) 40 MG tablet Take 40 mg by mouth daily.    . cyclobenzaprine (FLEXERIL) 5 MG tablet Take 5-10 mg by mouth daily.     . diclofenac Sodium (VOLTAREN) 1 % GEL Apply 2 g topically 4 (four) times daily. 50 g 0  . doxazosin (CARDURA) 4 MG tablet Take 4 mg by mouth daily.    . ergocalciferol (VITAMIN D2) 1.25 MG (50000 UT) capsule Take 50,000 Units by mouth every Friday.     . fexofenadine (ALLEGRA) 180 MG tablet Take 180 mg by mouth daily as needed for allergies.     . furosemide (LASIX) 80 MG tablet Take 1 tablet (80 mg total) by mouth 2 (two) times daily. 60 tablet 0  . gabapentin (NEURONTIN) 300 MG capsule Take 1 capsule (300 mg total) by mouth 2 (two) times daily. (Patient taking differently: Take 900 mg by mouth 3 (three) times daily. Pt states taking 300mg  - 3 po tid) 60 capsule 0  . glipiZIDE (GLUCOTROL XL) 10 MG 24 hr tablet Take 10 mg by mouth daily with breakfast.     . hydrALAZINE (APRESOLINE) 100 MG tablet Take 1 tablet (100 mg total) by mouth 3 (three) times daily. 90 tablet 0  . hydrocortisone cream 1 % Apply topically 2 (two) times daily.  Areas of itch 30 g 0  . isosorbide mononitrate (IMDUR) 30 MG 24 hr tablet Take 3 tablets (90 mg total) by mouth daily. 90 tablet 0  . linaclotide (LINZESS) 145 MCG CAPS capsule Take 145 mcg by mouth daily as needed (constipation).     . Multiple Vitamin (MULTIVITAMIN WITH MINERALS) TABS tablet Take 1 tablet by mouth daily.    Marland Kitchen oxyCODONE-acetaminophen (PERCOCET/ROXICET) 5-325 MG tablet Take 0.5 tablets by mouth every 6 (six) hours as needed for moderate pain or severe pain.     . pantoprazole (PROTONIX) 40 MG tablet Take 1 tablet (40 mg total) by mouth daily. [place]  tablet 11  . polyethylene glycol (MIRALAX / GLYCOLAX) packet Take 17 g by mouth daily as needed for mild constipation. 14 each 0  . triamcinolone cream (KENALOG) 0.1 % Apply 1 application topically daily as needed (itching on back).     . triamcinolone ointment (KENALOG) 0.1 % Apply 1 application topically 2 (two) times daily. Gel on inside of ear     No current facility-administered medications for this visit.    Allergies:   Aspirin, Allopurinol, Amlodipine, Carvedilol, Clonidine derivatives, Lisinopril, and Other    Social History:  The patient  reports that she has been smoking cigarettes. She has a 19.50 pack-year smoking history. She has never used smokeless tobacco. She reports previous drug use. Drug: Marijuana. She reports that she does not drink alcohol.   Family History:  The patient's family history includes Arthritis in her maternal grandmother and mother; Cancer in her sister; Emphysema in her mother; Heart failure in her mother, sister, and sister; Hypertension in her maternal grandmother and mother; Lung disease in her sister; Lupus in her child; Stroke in her maternal grandfather.    ROS:  Please see the history of present illness.   Otherwise, review of systems are positive for none.   All other systems are reviewed and negative.    PHYSICAL EXAM: VS:  BP (!) 142/80 (BP Location: Left Arm, Patient Position: Sitting,  Cuff Size: Large)   Pulse 80   Temp 98 F (36.7 C)   Ht 5\' 8"  (1.727 m)   Wt 230 lb (104.3 kg)   BMI 34.97 kg/m  , BMI Body mass index is 34.97 kg/m. GENERAL:  Well appearing HEENT:  Pupils equal round and reactive, fundi not visualized, oral mucosa unremarkable NECK:  No jugular venous distention, waveform within normal limits, carotid upstroke brisk and symmetric, no bruits LUNGS:  Clear to auscultation bilaterally HEART:  RRR.  PMI not displaced or sustained,S1 and S2 within normal limits, no S3, no S4, no clicks, no rubs, no murmurs ABD:  Flat, positive bowel sounds normal in frequency in pitch, no bruits, no rebound, no guarding, no midline pulsatile mass, no hepatomegaly, no splenomegaly EXT:  2 plus pulses throughout, no edema, no cyanosis no clubbing SKIN:  No rashes no nodules NEURO:  Cranial nerves II through XII grossly intact, motor grossly intact throughout PSYCH:  Cognitively intact, oriented to person place and time   EKG:  EKG is not ordered today. The ekg ordered today demonstrates sinus rhythm rate 90 bpm.   03/18/19: Sinus rhythm.  Rate 90 bpm.  LVH.    Echo 04/28/18: IMPRESSIONS  1. The left ventricle has normal systolic function of 02-40%. The cavity size was normal. There is mildly increased left ventricular wall thickness. Echo evidence of impaired diastolic relaxation Elevated mean left atrial pressure. 2. The right ventricle has normal systolic function. The cavity was normal. There is no increase in right ventricular wall thickness. 3. Left atrial size was mildly dilated. 4. The mitral valve is normal in structure. There is mild thickening. 5. The tricuspid valve is normal in structure. 6. The aortic valve is normal in structure. Aortic valve regurgitation is mild by color flow Doppler. 7. The inferior vena cava was dilated in size with >50% respiratory variability.  Recent Labs: 02/13/2019: B Natriuretic Peptide 2,250.1; Magnesium 2.4; TSH  1.872 02/16/2019: ALT 178 02/17/2019: Hemoglobin 8.9; Platelets 224 02/18/2019: BUN 42; Creatinine, Ser 3.11; Potassium 4.0; Sodium 138    Lipid Panel    Component Value Date/Time  CHOL 103 12/20/2018 1739   TRIG 68 12/20/2018 1739   HDL 35 (L) 12/20/2018 1739   CHOLHDL 2.9 12/20/2018 1739   VLDL 14 12/20/2018 1739   LDLCALC 54 12/20/2018 1739      Wt Readings from Last 3 Encounters:  04/29/19 230 lb (104.3 kg)  03/18/19 234 lb (106.1 kg)  02/23/19 244 lb 9.6 oz (110.9 kg)      ASSESSMENT AND PLAN:  # Chronic systolic and diastolic heart failure: # Essential hypertension: Ms. Bennick has diastolic heart failure.  She has been hospitalized multiple times.  This is at least partially due to poorly controlled blood pressure.    Her blood pressure remains poorly controlled.  She did not tolerate clonidine.  We order doxazosin and she has developed has not yet started taking it.  Today she did decide that she will give it a try.  Start doxazosin 4 mg daily.  Continue hydralazine and Imdur.  She has ACE-I/ARB, beta blocker and amlodipine allergies.  Renal function is also a limitation in medication options.   # Hyperlipidemia: Continue atorvastatin.  LDL 55 on 09/2018.  # Tobacco abuse: Ms Lisby was encouraged to quit smoking.  She remains uninterested in quitting at this time.   # Pre-surgical risk assessment: Lexiscan Myoview 07/2018 was negative for ischemia.  She has no ischemic symptoms.  BP better controlled.  OK for surgery.  # Likely OSA:  Patient has snoring, apnea and tired upon awakening.  She will need a sleep study.      Current medicines are reviewed at length with the patient today.  The patient does not have concerns regarding medicines.  The following changes have been made: add clonidine 0.1mg  bid  Labs/ tests ordered today include:   No orders of the defined types were placed in this encounter.    Disposition:   FU with Shaleena Crusoe C. Oval Linsey, MD,  [facility] in 6-8 weeks     Signed, [place] Oval Linsey, MD, [facility]  04/29/2019 12:26 PM    [place]

## 2019-04-29 NOTE — Patient Instructions (Signed)
Medication Instructions:  START DOXAZOSIN 4 MG DAILY   *If you need a refill on your cardiac medications before your next appointment, please call your pharmacy*  Lab Work: NONE  Testing/Procedures: NONE  Follow-Up: At [facility], you and your health needs are our priority.  As part of our continuing mission to provide you with exceptional heart care, we have created designated Provider Care Teams.  These Care Teams include your primary Cardiologist (physician) and Advanced Practice Providers (APPs -  Physician Assistants and Nurse Practitioners) who all work together to provide you with the care you need, when you need it.  Your next appointment:   2 month(s)  The format for your next appointment:   In Person  Provider:   You may see Skeet Latch, MD or one of the following Advanced Practice Providers on your designated Care Team:    Kerin Ransom, PA-C  [place], [place]  Coletta Memos, [place]

## 2019-05-12 DIAGNOSIS — N189 Chronic kidney disease, unspecified: Secondary | ICD-10-CM | POA: Diagnosis not present

## 2019-05-12 DIAGNOSIS — I129 Hypertensive chronic kidney disease with stage 1 through stage 4 chronic kidney disease, or unspecified chronic kidney disease: Secondary | ICD-10-CM | POA: Diagnosis not present

## 2019-05-12 DIAGNOSIS — N179 Acute kidney failure, unspecified: Secondary | ICD-10-CM | POA: Diagnosis not present

## 2019-05-12 DIAGNOSIS — E1122 Type 2 diabetes mellitus with diabetic chronic kidney disease: Secondary | ICD-10-CM | POA: Diagnosis not present

## 2019-05-12 DIAGNOSIS — N184 Chronic kidney disease, stage 4 (severe): Secondary | ICD-10-CM | POA: Diagnosis not present

## 2019-05-12 DIAGNOSIS — N2581 Secondary hyperparathyroidism of renal origin: Secondary | ICD-10-CM | POA: Diagnosis not present

## 2019-05-12 DIAGNOSIS — Z720 Tobacco use: Secondary | ICD-10-CM | POA: Diagnosis not present

## 2019-05-12 DIAGNOSIS — E669 Obesity, unspecified: Secondary | ICD-10-CM | POA: Diagnosis not present

## 2019-05-12 DIAGNOSIS — D631 Anemia in chronic kidney disease: Secondary | ICD-10-CM | POA: Diagnosis not present

## 2019-05-13 ENCOUNTER — Telehealth: Payer: Self-pay | Admitting: Cardiovascular Disease

## 2019-05-13 NOTE — Telephone Encounter (Signed)
Donita from [place] is calling to report the patient fluctuating weight. She states two weeks ago she weighed 230, last week she weighed 225, and today she weighed 235. She states the patient is not experiencing any symptoms and just wanted to inform Dr. Oval Linsey of it.

## 2019-05-13 NOTE — Telephone Encounter (Signed)
Will route to make MD aware.  Thank you!

## 2019-05-18 DIAGNOSIS — F1210 Cannabis abuse, uncomplicated: Secondary | ICD-10-CM | POA: Diagnosis not present

## 2019-05-18 DIAGNOSIS — E119 Type 2 diabetes mellitus without complications: Secondary | ICD-10-CM | POA: Diagnosis not present

## 2019-05-18 DIAGNOSIS — K8020 Calculus of gallbladder without cholecystitis without obstruction: Secondary | ICD-10-CM | POA: Diagnosis not present

## 2019-05-18 DIAGNOSIS — I509 Heart failure, unspecified: Secondary | ICD-10-CM | POA: Diagnosis not present

## 2019-05-18 DIAGNOSIS — F17210 Nicotine dependence, cigarettes, uncomplicated: Secondary | ICD-10-CM | POA: Diagnosis not present

## 2019-05-18 DIAGNOSIS — Z9181 History of falling: Secondary | ICD-10-CM | POA: Diagnosis not present

## 2019-05-18 DIAGNOSIS — Z7984 Long term (current) use of oral hypoglycemic drugs: Secondary | ICD-10-CM | POA: Diagnosis not present

## 2019-06-02 ENCOUNTER — Other Ambulatory Visit: Payer: Self-pay | Admitting: Surgery

## 2019-06-02 DIAGNOSIS — K8020 Calculus of gallbladder without cholecystitis without obstruction: Secondary | ICD-10-CM | POA: Diagnosis not present

## 2019-06-24 ENCOUNTER — Ambulatory Visit: Payer: PPO | Admitting: Cardiovascular Disease

## 2019-06-24 NOTE — Progress Notes (Addendum)
Zoe Clarke  Your procedure is scheduled on Wednesday April 14.  Report to [facility] Main Entrance "A" at 06:30 A.M., and check in at the Admitting office.  Call this number if you have problems the morning of surgery: [phone]  Call [phone] if you have any questions prior to your surgery date Monday-Friday 8am-4pm   Remember: Do not eat after midnight the night before your surgery  You may drink clear liquids until 05:30 A.M. the morning of your surgery.   Clear liquids allowed are: Water, Non-Citrus Juices (without pulp), Carbonated Beverages, Clear Tea, Black Coffee Only, and Gatorade   Take these medicines the morning of surgery with A SIP OF WATER: atorvastatin (LIPITOR) doxazosin (CARDURA) gabapentin (NEURONTIN)  hydrALAZINE (APRESOLINE) isosorbide mononitrate (IMDUR)  pantoprazole (PROTONIX)   Take if needed:  oxyCODONE-acetaminophen (PERCOCET/ROXICET) albuterol (PROVENTIL HFA;VENTOLIN HFA) --- Please bring all inhalers with you the day of surgery.    As of today, STOP taking any Aspirin (unless otherwise instructed by your surgeon), Aleve, Naproxen, Ibuprofen, Motrin, Advil, Goody's, BC's, all herbal medications, fish oil, and all vitamins.  Please complete your PRE-SURGERY 10 oz WATER that was provided to you by 05:30 A.M. the morning of surgery.  Please, if able, drink it in one setting. DO NOT SIP.   WHAT DO I DO ABOUT MY DIABETES MEDICATION?   . THE DAY BEFORE SURGERY --- glipiZIDE (GLUCOTROL XL) - morning dose only  . THE NIGHT BEFORE SURGERY --- glipiZIDE (GLUCOTROL XL) - no night time dose     . THE MORNING OF SURGERY --- glipiZIDE (GLUCOTROL XL) - NONE - do not take any oral diabetic medications morning of surgery   HOW TO MANAGE YOUR DIABETES BEFORE AND AFTER SURGERY  Why is it important to control my blood sugar before and after surgery? . Improving blood sugar levels before and after surgery helps healing and can limit problems. . A  way of improving blood sugar control is eating a healthy diet by: o  Eating less sugar and carbohydrates o  Increasing activity/exercise o  Talking with your doctor about reaching your blood sugar goals . High blood sugars (greater than 180 mg/dL) can raise your risk of infections and slow your recovery, so you will need to focus on controlling your diabetes during the weeks before surgery. . Make sure that the doctor who takes care of your diabetes knows about your planned surgery including the date and location.  How do I manage my blood sugar before surgery? . Check your blood sugar at least 4 times a day, starting 2 days before surgery, to make sure that the level is not too high or low. . Check your blood sugar the morning of your surgery when you wake up and every 2 hours until you get to the Short Stay unit. o If your blood sugar is less than 70 mg/dL, you will need to treat for low blood sugar: - Do not take insulin. - Treat a low blood sugar (less than 70 mg/dL) with  cup of clear juice (cranberry or apple), 4 glucose tablets, OR glucose gel. - Recheck blood sugar in 15 minutes after treatment (to make sure it is greater than 70 mg/dL). If your blood sugar is not greater than 70 mg/dL on recheck, call [phone] for further instructions. . Report your blood sugar to the short stay nurse when you get to Short Stay.  . If you are admitted to the hospital after surgery: o Your blood sugar will be  checked by the staff and you will probably be given insulin after surgery (instead of oral diabetes medicines) to make sure you have good blood sugar levels. o The goal for blood sugar control after surgery is 80-180 mg/dL.       The Morning of Surgery  Do not wear jewelry, make-up or nail polish.  Do not wear lotions, powders, perfumes, or deodorant  Do not shave 48 hours prior to surgery.   Do not bring valuables to the hospital.  [facility] is not responsible for any belongings or  valuables.  If you are a smoker, DO NOT Smoke 24 hours prior to surgery  If you wear a CPAP at night please bring your mask the morning of surgery   Remember that you must have someone to transport you home after your surgery, and remain with you for 24 hours if you are discharged the same day.   Please bring cases for contacts, glasses, hearing aids, dentures or bridgework because it cannot be worn into surgery.    Leave your suitcase in the car.  After surgery it may be brought to your room.  For patients admitted to the hospital, discharge time will be determined by your treatment team.  Patients discharged the day of surgery will not be allowed to drive home.    Special instructions:   [place]- Preparing For Surgery  Before surgery, you can play an important role. Because skin is not sterile, your skin needs to be as free of germs as possible. You can reduce the number of germs on your skin by washing with CHG (chlorahexidine gluconate) Soap before surgery.  CHG is an antiseptic cleaner which kills germs and bonds with the skin to continue killing germs even after washing.    Oral Hygiene is also important to reduce your risk of infection.  Remember - BRUSH YOUR TEETH THE MORNING OF SURGERY WITH YOUR REGULAR TOOTHPASTE  Please do not use if you have an allergy to CHG or antibacterial soaps. If your skin becomes reddened/irritated stop using the CHG.  Do not shave (including legs and underarms) for at least 48 hours prior to first CHG shower. It is OK to shave your face.  Please follow these instructions carefully.   1. Shower the NIGHT BEFORE SURGERY and the MORNING OF SURGERY with CHG Soap.   2. If you chose to wash your hair and body, wash as usual with your normal shampoo and body-wash/soap.  3. Rinse your hair and body thoroughly to remove the shampoo and soap.  4. Apply CHG directly to the skin (ONLY FROM THE NECK DOWN) and wash gently with a scrungie or a clean  washcloth.   5. Do not use on open wounds or open sores. Avoid contact with your eyes, ears, mouth and genitals (private parts). Wash Face and genitals (private parts)  with your normal soap.   6. Wash thoroughly, paying special attention to the area where your surgery will be performed.  7. Thoroughly rinse your body with warm water from the neck down.  8. DO NOT shower/wash with your normal soap after using and rinsing off the CHG Soap.  9. Pat yourself dry with a CLEAN TOWEL.  10. Wear CLEAN PAJAMAS to bed the night before surgery  11. Place CLEAN SHEETS on your bed the night of your first shower and DO NOT SLEEP WITH PETS.  12. Wear comfortable clothes the morning of surgery.     Day of Surgery:  Please shower  the morning of surgery with the CHG soap Do not apply any deodorants/lotions. Please wear clean clothes to the hospital/surgery center.   Remember to brush your teeth WITH YOUR REGULAR TOOTHPASTE.   Please read over the following fact sheets that you were given.

## 2019-06-25 ENCOUNTER — Other Ambulatory Visit: Payer: Self-pay

## 2019-06-25 ENCOUNTER — Encounter (HOSPITAL_COMMUNITY): Payer: Self-pay | Admitting: Physician Assistant

## 2019-06-25 ENCOUNTER — Emergency Department (HOSPITAL_BASED_OUTPATIENT_CLINIC_OR_DEPARTMENT_OTHER)
Admit: 2019-06-25 | Discharge: 2019-06-25 | Disposition: A | Discharge status: Home / Self Care | Payer: PPO | Attending: Emergency Medicine | Admitting: Emergency Medicine

## 2019-06-25 ENCOUNTER — Inpatient Hospital Stay (HOSPITAL_COMMUNITY)
Admission: EM | Admit: 2019-06-25 | Discharge: 2019-06-30 | DRG: 291 | Disposition: A | Discharge status: Home Health | Payer: PPO | Attending: Family Medicine | Admitting: Family Medicine

## 2019-06-25 ENCOUNTER — Encounter (HOSPITAL_COMMUNITY)
Admission: RE | Admit: 2019-06-25 | Discharge: 2019-06-25 | Disposition: A | Discharge status: Home / Self Care | Payer: PPO | Source: Ambulatory Visit | Attending: Surgery | Admitting: Surgery

## 2019-06-25 ENCOUNTER — Emergency Department (HOSPITAL_COMMUNITY): Payer: PPO

## 2019-06-25 DIAGNOSIS — K819 Cholecystitis, unspecified: Secondary | ICD-10-CM | POA: Diagnosis not present

## 2019-06-25 DIAGNOSIS — Z825 Family history of asthma and other chronic lower respiratory diseases: Secondary | ICD-10-CM | POA: Diagnosis not present

## 2019-06-25 DIAGNOSIS — R600 Localized edema: Secondary | ICD-10-CM

## 2019-06-25 DIAGNOSIS — F17210 Nicotine dependence, cigarettes, uncomplicated: Secondary | ICD-10-CM | POA: Diagnosis not present

## 2019-06-25 DIAGNOSIS — Z8249 Family history of ischemic heart disease and other diseases of the circulatory system: Secondary | ICD-10-CM

## 2019-06-25 DIAGNOSIS — J449 Chronic obstructive pulmonary disease, unspecified: Secondary | ICD-10-CM | POA: Diagnosis not present

## 2019-06-25 DIAGNOSIS — J9602 Acute respiratory failure with hypercapnia: Secondary | ICD-10-CM | POA: Diagnosis not present

## 2019-06-25 DIAGNOSIS — D649 Anemia, unspecified: Secondary | ICD-10-CM | POA: Diagnosis not present

## 2019-06-25 DIAGNOSIS — G9341 Metabolic encephalopathy: Secondary | ICD-10-CM | POA: Diagnosis present

## 2019-06-25 DIAGNOSIS — Z7984 Long term (current) use of oral hypoglycemic drugs: Secondary | ICD-10-CM

## 2019-06-25 DIAGNOSIS — E785 Hyperlipidemia, unspecified: Secondary | ICD-10-CM | POA: Diagnosis not present

## 2019-06-25 DIAGNOSIS — Z6835 Body mass index (BMI) 35.0-35.9, adult: Secondary | ICD-10-CM

## 2019-06-25 DIAGNOSIS — E872 Acidosis: Secondary | ICD-10-CM | POA: Diagnosis present

## 2019-06-25 DIAGNOSIS — D631 Anemia in chronic kidney disease: Secondary | ICD-10-CM

## 2019-06-25 DIAGNOSIS — N184 Chronic kidney disease, stage 4 (severe): Secondary | ICD-10-CM | POA: Diagnosis not present

## 2019-06-25 DIAGNOSIS — K8010 Calculus of gallbladder with chronic cholecystitis without obstruction: Secondary | ICD-10-CM | POA: Diagnosis not present

## 2019-06-25 DIAGNOSIS — Z79899 Other long term (current) drug therapy: Secondary | ICD-10-CM

## 2019-06-25 DIAGNOSIS — I5033 Acute on chronic diastolic (congestive) heart failure: Secondary | ICD-10-CM

## 2019-06-25 DIAGNOSIS — G4733 Obstructive sleep apnea (adult) (pediatric): Secondary | ICD-10-CM | POA: Diagnosis present

## 2019-06-25 DIAGNOSIS — R609 Edema, unspecified: Secondary | ICD-10-CM

## 2019-06-25 DIAGNOSIS — I10 Essential (primary) hypertension: Secondary | ICD-10-CM | POA: Diagnosis not present

## 2019-06-25 DIAGNOSIS — M7989 Other specified soft tissue disorders: Secondary | ICD-10-CM | POA: Diagnosis not present

## 2019-06-25 DIAGNOSIS — E1122 Type 2 diabetes mellitus with diabetic chronic kidney disease: Secondary | ICD-10-CM | POA: Diagnosis not present

## 2019-06-25 DIAGNOSIS — M810 Age-related osteoporosis without current pathological fracture: Secondary | ICD-10-CM | POA: Diagnosis not present

## 2019-06-25 DIAGNOSIS — I509 Heart failure, unspecified: Secondary | ICD-10-CM

## 2019-06-25 DIAGNOSIS — Z20822 Contact with and (suspected) exposure to covid-19: Secondary | ICD-10-CM

## 2019-06-25 DIAGNOSIS — I130 Hypertensive heart and chronic kidney disease with heart failure and stage 1 through stage 4 chronic kidney disease, or unspecified chronic kidney disease: Secondary | ICD-10-CM | POA: Diagnosis not present

## 2019-06-25 DIAGNOSIS — I129 Hypertensive chronic kidney disease with stage 1 through stage 4 chronic kidney disease, or unspecified chronic kidney disease: Secondary | ICD-10-CM | POA: Diagnosis not present

## 2019-06-25 DIAGNOSIS — K8080 Other cholelithiasis without obstruction: Secondary | ICD-10-CM | POA: Diagnosis not present

## 2019-06-25 DIAGNOSIS — R0602 Shortness of breath: Secondary | ICD-10-CM | POA: Diagnosis not present

## 2019-06-25 DIAGNOSIS — I5020 Unspecified systolic (congestive) heart failure: Secondary | ICD-10-CM | POA: Diagnosis not present

## 2019-06-25 DIAGNOSIS — N189 Chronic kidney disease, unspecified: Secondary | ICD-10-CM | POA: Diagnosis not present

## 2019-06-25 DIAGNOSIS — I5031 Acute diastolic (congestive) heart failure: Secondary | ICD-10-CM

## 2019-06-25 DIAGNOSIS — F1210 Cannabis abuse, uncomplicated: Secondary | ICD-10-CM | POA: Diagnosis not present

## 2019-06-25 DIAGNOSIS — Z8261 Family history of arthritis: Secondary | ICD-10-CM | POA: Diagnosis not present

## 2019-06-25 DIAGNOSIS — R0609 Other forms of dyspnea: Secondary | ICD-10-CM

## 2019-06-25 LAB — GLUCOSE, CAPILLARY
Glucose-Capillary: 153 mg/dL — ABNORMAL HIGH (ref 70–99)
Glucose-Capillary: 77 mg/dL (ref 70–99)
Glucose-Capillary: 102 mg/dL — ABNORMAL HIGH (ref 70–99)

## 2019-06-25 LAB — BASIC METABOLIC PANEL
Anion gap: 10 (ref 5–15)
Anion gap: 10 (ref 5–15)
BUN: 37 mg/dL — ABNORMAL HIGH (ref 8–23)
BUN: 37 mg/dL — ABNORMAL HIGH (ref 8–23)
CO2: 22 mmol/L (ref 22–32)
CO2: 23 mmol/L (ref 22–32)
Calcium: 8.3 mg/dL — ABNORMAL LOW (ref 8.9–10.3)
Calcium: 8.3 mg/dL — ABNORMAL LOW (ref 8.9–10.3)
Chloride: 111 mmol/L (ref 98–111)
Chloride: 110 mmol/L (ref 98–111)
Creatinine, Ser: 2.71 mg/dL — ABNORMAL HIGH (ref 0.44–1.00)
Creatinine, Ser: 2.72 mg/dL — ABNORMAL HIGH (ref 0.44–1.00)
GFR calc Af Amer: 20 mL/min — ABNORMAL LOW (ref >60)
GFR calc Af Amer: 20 mL/min — ABNORMAL LOW (ref >60)
GFR calc non Af Amer: 17 mL/min — ABNORMAL LOW (ref >60)
GFR calc non Af Amer: 17 mL/min — ABNORMAL LOW (ref >60)
Glucose, Bld: 166 mg/dL — ABNORMAL HIGH (ref 70–99)
Glucose, Bld: 159 mg/dL — ABNORMAL HIGH (ref 70–99)
Potassium: 4.2 mmol/L (ref 3.5–5.1)
Potassium: 4.1 mmol/L (ref 3.5–5.1)
Sodium: 143 mmol/L (ref 135–145)
Sodium: 143 mmol/L (ref 135–145)

## 2019-06-25 LAB — POC OCCULT BLOOD, ED: Fecal Occult Bld: NEGATIVE

## 2019-06-25 LAB — CBC
HCT: 24.9 % — ABNORMAL LOW (ref 36.0–46.0)
HCT: 25.1 % — ABNORMAL LOW (ref 36.0–46.0)
Hemoglobin: 7.5 g/dL — ABNORMAL LOW (ref 12.0–15.0)
Hemoglobin: 7.6 g/dL — ABNORMAL LOW (ref 12.0–15.0)
MCH: 27.7 pg (ref 26.0–34.0)
MCH: 27.7 pg (ref 26.0–34.0)
MCHC: 30.1 g/dL (ref 30.0–36.0)
MCHC: 30.3 g/dL (ref 30.0–36.0)
MCV: 91.9 fL (ref 80.0–100.0)
MCV: 91.6 fL (ref 80.0–100.0)
Platelets: 187 10*3/uL (ref 150–400)
Platelets: 199 10*3/uL (ref 150–400)
RBC: 2.71 MIL/uL — ABNORMAL LOW (ref 3.87–5.11)
RBC: 2.74 MIL/uL — ABNORMAL LOW (ref 3.87–5.11)
RDW: 16.2 % — ABNORMAL HIGH (ref 11.5–15.5)
RDW: 16.1 % — ABNORMAL HIGH (ref 11.5–15.5)
WBC: 7.1 10*3/uL (ref 4.0–10.5)
WBC: 7.4 10*3/uL (ref 4.0–10.5)
nRBC: 0 % (ref 0.0–0.2)
nRBC: 0 % (ref 0.0–0.2)

## 2019-06-25 LAB — TROPONIN I (HIGH SENSITIVITY)
Troponin I (High Sensitivity): 29 ng/L — ABNORMAL HIGH (ref <18)
Troponin I (High Sensitivity): 28 ng/L — ABNORMAL HIGH (ref <18)

## 2019-06-25 LAB — ABO/RH: ABO/RH(D): O POS

## 2019-06-25 LAB — PREPARE RBC (CROSSMATCH)

## 2019-06-25 LAB — BRAIN NATRIURETIC PEPTIDE: B Natriuretic Peptide: 819.9 pg/mL — ABNORMAL HIGH (ref 0.0–100.0)

## 2019-06-25 LAB — RESPIRATORY PANEL BY RT PCR (FLU A&B, COVID)
Influenza A by PCR: NEGATIVE
Influenza B by PCR: NEGATIVE
SARS Coronavirus 2 by RT PCR: NEGATIVE

## 2019-06-25 MED ORDER — FUROSEMIDE 10 MG/ML IJ SOLN
80.0000 mg | Freq: Once | INTRAMUSCULAR | Status: AC
  Administered 2019-06-25: 13:00:00 80 mg via INTRAVENOUS
  Filled 2019-06-25: qty 8

## 2019-06-25 MED ORDER — MORPHINE SULFATE (PF) 4 MG/ML IV SOLN
4.0000 mg | Freq: Once | INTRAVENOUS | Status: AC
  Administered 2019-06-25: 4 mg via INTRAVENOUS
  Filled 2019-06-25: qty 1

## 2019-06-25 MED ORDER — SODIUM CHLORIDE 0.9% FLUSH
3.0000 mL | Freq: Two times a day (BID) | INTRAVENOUS | Status: DC
  Administered 2019-06-25 – 2019-06-30 (×9): 3 mL via INTRAVENOUS

## 2019-06-25 MED ORDER — FUROSEMIDE 10 MG/ML IJ SOLN
40.0000 mg | Freq: Two times a day (BID) | INTRAMUSCULAR | Status: DC
  Administered 2019-06-26: 40 mg via INTRAVENOUS
  Filled 2019-06-25: qty 4

## 2019-06-25 MED ORDER — ONDANSETRON HCL 4 MG/2ML IJ SOLN
4.0000 mg | Freq: Once | INTRAMUSCULAR | Status: AC
  Administered 2019-06-25: 4 mg via INTRAVENOUS
  Filled 2019-06-25: qty 2

## 2019-06-25 MED ORDER — HEPARIN SODIUM (PORCINE) 5000 UNIT/ML IJ SOLN
5000.0000 [IU] | Freq: Three times a day (TID) | INTRAMUSCULAR | Status: DC
  Administered 2019-06-25 – 2019-06-30 (×13): 5000 [IU] via SUBCUTANEOUS
  Filled 2019-06-25 (×15): qty 1

## 2019-06-25 MED ORDER — FUROSEMIDE 10 MG/ML IJ SOLN: 40.0000 mg | Freq: Once | INTRAMUSCULAR | Status: DC

## 2019-06-25 MED ORDER — HYDRALAZINE HCL 50 MG PO TABS
100.0000 mg | ORAL_TABLET | Freq: Three times a day (TID) | ORAL | Status: DC
  Administered 2019-06-25 – 2019-06-30 (×12): 100 mg via ORAL
  Filled 2019-06-25 (×13): qty 2

## 2019-06-25 MED ORDER — ONDANSETRON HCL 4 MG/2ML IJ SOLN
4.0000 mg | Freq: Four times a day (QID) | INTRAMUSCULAR | Status: DC | PRN
  Administered 2019-06-28 – 2019-06-29 (×2): 4 mg via INTRAVENOUS
  Filled 2019-06-25 (×2): qty 2

## 2019-06-25 MED ORDER — TRAMADOL HCL 50 MG PO TABS
50.0000 mg | ORAL_TABLET | Freq: Once | ORAL | Status: AC
  Administered 2019-06-25: 22:00:00 50 mg via ORAL
  Filled 2019-06-25: qty 1

## 2019-06-25 MED ORDER — SODIUM CHLORIDE 0.9% FLUSH: 3.0000 mL | INTRAVENOUS | Status: DC | PRN

## 2019-06-25 MED ORDER — INSULIN ASPART 100 UNIT/ML ~~LOC~~ SOLN
0.0000 [IU] | Freq: Three times a day (TID) | SUBCUTANEOUS | Status: DC
  Administered 2019-06-26: 07:00:00 2 [IU] via SUBCUTANEOUS
  Administered 2019-06-28 (×3): 1 [IU] via SUBCUTANEOUS
  Administered 2019-06-29: 12:00:00 2 [IU] via SUBCUTANEOUS
  Administered 2019-06-29: 1 [IU] via SUBCUTANEOUS
  Administered 2019-06-29: 06:00:00 2 [IU] via SUBCUTANEOUS
  Administered 2019-06-30: 1 [IU] via SUBCUTANEOUS
  Administered 2019-06-30: 2 [IU] via SUBCUTANEOUS

## 2019-06-25 MED ORDER — ISOSORBIDE MONONITRATE ER 60 MG PO TB24
60.0000 mg | ORAL_TABLET | Freq: Every day | ORAL | Status: DC
  Administered 2019-06-26 – 2019-06-30 (×5): 60 mg via ORAL
  Filled 2019-06-25 (×5): qty 1

## 2019-06-25 MED ORDER — LABETALOL HCL 5 MG/ML IV SOLN
5.0000 mg | INTRAVENOUS | Status: DC | PRN
  Administered 2019-06-28: 5 mg via INTRAVENOUS
  Filled 2019-06-25: qty 4

## 2019-06-25 MED ORDER — HYDROCODONE-ACETAMINOPHEN 5-325 MG PO TABS: 1.0000 | ORAL_TABLET | Freq: Once | ORAL | Status: DC

## 2019-06-25 MED ORDER — SODIUM CHLORIDE 0.9% IV SOLUTION: Freq: Once | INTRAVENOUS | Status: DC

## 2019-06-25 MED ORDER — HYDRALAZINE HCL 25 MG PO TABS
100.0000 mg | ORAL_TABLET | Freq: Once | ORAL | Status: AC
  Administered 2019-06-25: 100 mg via ORAL
  Filled 2019-06-25: qty 4

## 2019-06-25 MED ORDER — SODIUM CHLORIDE 0.9 % IV SOLN: 250.0000 mL | INTRAVENOUS | Status: DC | PRN

## 2019-06-25 MED ORDER — METOLAZONE 5 MG PO TABS
5.0000 mg | ORAL_TABLET | Freq: Every day | ORAL | Status: DC
  Administered 2019-06-26 – 2019-06-27 (×2): 5 mg via ORAL
  Filled 2019-06-25 (×3): qty 1

## 2019-06-25 MED ORDER — ACETAMINOPHEN 325 MG PO TABS
650.0000 mg | ORAL_TABLET | ORAL | Status: DC | PRN
  Administered 2019-06-27 – 2019-06-30 (×4): 650 mg via ORAL
  Filled 2019-06-25 (×5): qty 2

## 2019-06-25 NOTE — H&P (Signed)
History and Physical    Zoe Clarke YIF:[id number] DOB: [birth year] DOA: 06/25/2019  PCP: Antony Contras, MD   Patient coming from: Home  I have personally briefly reviewed patient's old medical records in [place]  Chief Complaint: SOB  HPI: Zoe Clarke is a 69 y.o. female with medical history significant of CKD stage IIIb, hypertension, cholelithiasis, morbid obesity, tobacco abuse, marijuana abuse, chronic diastolic congestive heart failure, anemia of chronic disease presents to emergency department due to worsening shortness of breath.  Her symptoms started about 2 days ago, exertional dyspnea with heaviness on the chest, mild intermittent cough nonproductive, no fever chills.  Patient also admitted she has trouble control her blood pressure, she has been on 3 different BP meds with poorly controlled blood pressure and she is allergic to several other BP meds.  She went to surgeon for preop labs for elective cholecystectomy next week, and was found very tachypneic and sent to the ED for evaluation. ED Course: Patient was found to have fluid overload, chest x-ray showed cardiomegaly, hemoglobin 7.6 compared to 8.9 4 months ago, Cre level remained stable  Review of Systems: As per HPI otherwise 10 point review of systems negative.   Past Medical History:  Diagnosis Date  . Allergy   . Arthritis   . Asthma    weazing at night uses inhalers  . Bronchitis    history of  . Chronic diastolic CHF (congestive heart failure) ([place]) 04/2018  . CKD stage 4 due to type 2 diabetes mellitus ([place])   . Diabetes mellitus without complication ([place])   . Gastric ulcer   . GERD (gastroesophageal reflux disease)   . History of hiatal hernia   . Hypertension   . Osteoporosis   . Sleep apnea 09/22/2018    Past Surgical History:  Procedure Laterality Date  . DILATION AND CURETTAGE OF UTERUS    . EYE SURGERY Left    removed cataract w/ lens  . TUBAL LIGATION       reports that  she has been smoking cigarettes. She has a 19.50 pack-year smoking history. She has never used smokeless tobacco. She reports previous drug use. Drug: Marijuana. She reports that she does not drink alcohol.  Allergies  Allergen Reactions  . Aspirin Other (See Comments)    Feels funny   . Allopurinol     Dizzy; hard to ambulate  Couldn't see; sweating  . Amlodipine Other (See Comments)    headache  . Carvedilol     wheezing  . Clonidine Derivatives     HEADACHES  . Lisinopril Swelling    Face swelling  . Other Itching    Squash    Family History  Problem Relation Age of Onset  . Arthritis Mother   . Hypertension Mother   . Heart failure Mother   . Emphysema Mother   . Arthritis Maternal Grandmother   . Hypertension Maternal Grandmother   . Stroke Maternal Grandfather   . Cancer Sister        unknown type cancer  . Heart failure Sister   . Lung disease Sister   . Heart failure Sister   . Lupus Child   . Colon cancer Neg Hx   . Esophageal cancer Neg Hx   . Stomach cancer Neg Hx   . Rectal cancer Neg Hx      Prior to Admission medications   Medication Sig Start Date End Date Taking? Authorizing Provider  albuterol (PROVENTIL HFA;VENTOLIN HFA) 108 (90 Base)  MCG/ACT inhaler Inhale 2 puffs into the lungs every 6 (six) hours as needed for wheezing or shortness of breath. 04/14/18  Yes Bonnell Public, MD  atorvastatin (LIPITOR) 40 MG tablet Take 40 mg by mouth daily.   Yes [provider]  cyclobenzaprine (FLEXERIL) 5 MG tablet Take 5 mg by mouth at bedtime as needed for muscle pain. 06/22/19  Yes [provider]  diphenhydramine-acetaminophen (TYLENOL PM) 25-500 MG TABS tablet Take 2 tablets by mouth at bedtime as needed (for pain).   Yes [provider]  doxazosin (CARDURA) 4 MG tablet Take 4 mg by mouth daily.   Yes [provider]  ergocalciferol (VITAMIN D2) 1.25 MG (50000 UT) capsule Take 50,000 Units by mouth every Friday.    Yes  [provider]  ferrous sulfate 325 (65 FE) MG tablet Take 325 mg by mouth daily with breakfast.   Yes [provider]  fexofenadine (ALLEGRA) 180 MG tablet Take 180 mg by mouth daily as needed for allergies.    Yes [provider]  furosemide (LASIX) 80 MG tablet Take 1 tablet (80 mg total) by mouth 2 (two) times daily. 02/18/19  Yes Vann, Jessica U, DO  gabapentin (NEURONTIN) 300 MG capsule Take 1 capsule (300 mg total) by mouth 2 (two) times daily. Patient taking differently: Take 900 mg by mouth 3 (three) times daily.  02/18/19  Yes Vann, Jessica U, DO  glipiZIDE (GLUCOTROL XL) 10 MG 24 hr tablet Take 10 mg by mouth in the morning and at bedtime.    Yes [provider]  hydrALAZINE (APRESOLINE) 100 MG tablet Take 1 tablet (100 mg total) by mouth 3 (three) times daily. 02/18/19  Yes Eulogio Bear U, DO  hydrocortisone cream 1 % Apply topically 2 (two) times daily. Areas of itch Patient taking differently: Apply 1 application topically 2 (two) times daily as needed for itching.  09/30/18  Yes Pokhrel, Laxman, MD  isosorbide mononitrate (IMDUR) 30 MG 24 hr tablet Take 3 tablets (90 mg total) by mouth daily. Patient taking differently: Take 90 mg by mouth daily. Taking 1 tablet  (30mg ) three times daily 02/19/19  Yes Geradine Girt, DO  linaclotide (LINZESS) 145 MCG CAPS capsule Take 145 mcg by mouth daily as needed (constipation).    Yes [provider]  pantoprazole (PROTONIX) 40 MG tablet Take 1 tablet (40 mg total) by mouth daily. 07/14/18  Yes Skeet Latch, MD  polyethylene glycol [facility] / [name] packet Take 17 g by mouth daily as needed for mild constipation. 04/14/18  Yes Dana Allan I, MD  triamcinolone cream (KENALOG) 0.1 % Apply 1 application topically daily as needed (itching).    Yes [provider]  triamcinolone ointment (KENALOG) 0.1 % Apply 1 application topically daily as needed (ear itching).   Yes [provider]  diclofenac Sodium (VOLTAREN) 1 % GEL Apply 2 g topically 4 (four) times daily. Patient not taking: Reported on 06/17/2019 02/18/19   Geradine Girt, DO  oxyCODONE-acetaminophen (PERCOCET/ROXICET) 5-325 MG tablet Take 1 tablet by mouth every 6 (six) hours as needed for moderate pain or severe pain.     [provider]    Physical Exam: Vitals:   06/25/19 1315 06/25/19 1330 06/25/19 1345 06/25/19 1400  BP: (!) 185/86 (!) 175/81 (!) 202/72 (!) 180/65  Pulse: 75 68 92 71  Resp: 17 16 17 19   Temp:      TempSrc:      SpO2: 100% 100% 100% 100%  Constitutional: NAD, calm, comfortable Vitals:   06/25/19 1315 06/25/19 1330 06/25/19 1345 06/25/19 1400  BP: (!) 185/86 (!) 175/81 (!) 202/72 (!) 180/65  Pulse: 75 68 92 71  Resp: 17 16 17 19   Temp:      TempSrc:      SpO2: 100% 100% 100% 100%   Eyes: PERRL, lids and conjunctivae normal ENMT: Mucous membranes are moist. Posterior pharynx clear of any exudate or lesions.Normal dentition.  Neck: normal, supple, no masses, no thyromegaly Respiratory: clear to auscultation bilaterally, no wheezing, no crackles. Normal respiratory effort. No accessory muscle use.  Cardiovascular: Regular rate and rhythm, no murmurs / rubs / gallops. 2+ extremity edema. 2+ pedal pulses. No carotid bruits.  Abdomen: no tenderness, no masses palpated. No hepatosplenomegaly. Bowel sounds positive.  Musculoskeletal: no clubbing / cyanosis. No joint deformity upper and lower extremities. Good ROM, no contractures. Normal muscle tone.  Skin: no rashes, lesions, ulcers. No induration Neurologic: CN 2-12 grossly intact. Sensation intact, DTR normal. Strength 5/5 in all 4.  Psychiatric: Normal judgment and insight. Alert and oriented x 3. Normal mood.     Labs on Admission: I have personally reviewed following labs and imaging studies  CBC: Recent Labs  Lab 06/25/19 0914 06/25/19 0959  WBC 7.1 7.4  HGB 7.5* 7.6*  HCT 24.9* 25.1*  MCV 91.9  91.6  PLT 187 893   Basic Metabolic Panel: Recent Labs  Lab 06/25/19 0914 06/25/19 0959  NA 143 143  K 4.2 4.1  CL 111 110  CO2 22 23  GLUCOSE 166* 159*  BUN 37* 37*  CREATININE 2.71* 2.72*  CALCIUM 8.3* 8.3*   GFR: CrCl cannot be calculated (Unknown ideal weight.). Liver Function Tests: No results for input(s): AST, ALT, ALKPHOS, BILITOT, PROT, ALBUMIN in the last 168 hours. No results for input(s): LIPASE, AMYLASE in the last 168 hours. No results for input(s): AMMONIA in the last 168 hours. Coagulation Profile: No results for input(s): INR, PROTIME in the last 168 hours. Cardiac Enzymes: No results for input(s): CKTOTAL, CKMB, CKMBINDEX, TROPONINI in the last 168 hours. BNP (last 3 results) No results for input(s): PROBNP in the last 8760 hours. HbA1C: No results for input(s): HGBA1C in the last 72 hours. CBG: Recent Labs  Lab 06/25/19 0828  GLUCAP 153*   Lipid Profile: No results for input(s): CHOL, HDL, LDLCALC, TRIG, CHOLHDL, LDLDIRECT in the last 72 hours. Thyroid Function Tests: No results for input(s): TSH, T4TOTAL, FREET4, T3FREE, THYROIDAB in the last 72 hours. Anemia Panel: No results for input(s): VITAMINB12, FOLATE, FERRITIN, TIBC, IRON, RETICCTPCT in the last 72 hours. Urine analysis:    Component Value Date/Time   COLORURINE YELLOW 02/13/2019 1103   APPEARANCEUR CLEAR 02/13/2019 1103   LABSPEC 1.013 02/13/2019 1103   PHURINE 6.0 02/13/2019 1103   GLUCOSEU 50 (A) 02/13/2019 1103   HGBUR NEGATIVE 02/13/2019 1103   BILIRUBINUR NEGATIVE 02/13/2019 1103   BILIRUBINUR negative 02/25/2015 1426   KETONESUR NEGATIVE 02/13/2019 1103   PROTEINUR >=300 (A) 02/13/2019 1103   UROBILINOGEN 0.2 02/25/2015 1426   UROBILINOGEN 1.0 04/16/2009 0943   NITRITE NEGATIVE 02/13/2019 1103   LEUKOCYTESUR NEGATIVE 02/13/2019 1103    Radiological Exams on Admission: DG Chest Portable 1 View  Result Date: 06/25/2019 CLINICAL DATA:  Shortness of breath EXAM: PORTABLE  CHEST 1 VIEW COMPARISON:  February 13, 2019 FINDINGS: Lungs are clear. Heart is mildly enlarged with pulmonary vascularity normal. No adenopathy. No bone lesions. IMPRESSION: Mild cardiac prominence.  Lungs clear.  No adenopathy  evident. Electronically Signed   By: Lowella Grip III M.D.   On: 06/25/2019 11:28   VAS [place] LOWER EXTREMITY VENOUS (DVT) (ONLY [facility] & [facility])  Result Date: 06/25/2019  Lower Venous DVTStudy Indications: Swelling.  Comparison Study: no prior Performing Technologist: June Leap RDMS, RVT  Examination Guidelines: A complete evaluation includes B-mode imaging, spectral Doppler, color Doppler, and power Doppler as needed of all accessible portions of each vessel. Bilateral testing is considered an integral part of a complete examination. Limited examinations for reoccurring indications may be performed as noted. The reflux portion of the exam is performed with the patient in reverse Trendelenburg.  +-----+---------------+---------+-----------+----------+--------------+ RIGHTCompressibilityPhasicitySpontaneityPropertiesThrombus Aging +-----+---------------+---------+-----------+----------+--------------+ CFV  Full           Yes      Yes                                 +-----+---------------+---------+-----------+----------+--------------+   +---------+---------------+---------+-----------+----------+--------------+ LEFT     CompressibilityPhasicitySpontaneityPropertiesThrombus Aging +---------+---------------+---------+-----------+----------+--------------+ CFV      Full           Yes      Yes                                 +---------+---------------+---------+-----------+----------+--------------+ SFJ      Full                                                        +---------+---------------+---------+-----------+----------+--------------+ FV Prox  Full                                                         +---------+---------------+---------+-----------+----------+--------------+ FV Mid   Full                                                        +---------+---------------+---------+-----------+----------+--------------+ FV DistalFull                                                        +---------+---------------+---------+-----------+----------+--------------+ PFV      Full                                                        +---------+---------------+---------+-----------+----------+--------------+ POP      Full           Yes      Yes                                 +---------+---------------+---------+-----------+----------+--------------+ PTV  Full                                                        +---------+---------------+---------+-----------+----------+--------------+ PERO     Full                                                        +---------+---------------+---------+-----------+----------+--------------+     Summary: RIGHT: - No evidence of common femoral vein obstruction.  LEFT: - There is no evidence of deep vein thrombosis in the lower extremity.  - No cystic structure found in the popliteal fossa.  *See table(s) above for measurements and observations. Electronically signed by Curt Jews MD on 06/25/2019 at 3:13:04 PM.    Final     EKG: Independently reviewed.   Assessment/Plan Active Problems:   CHF (congestive heart failure) (HCC)  Acute on chronic diastolic CHF decompensation Secondary to poorly controlled hypertension.  Patient however allergic to Coreg, ACE inhibitor, clonidine.  In February 2021 cardiology visit, it was proposed patient start doxazosin, which patient did not tolerate well either. We will increase her Imdur to 60 mg daily, her hydralazine already maxed out. Increase her Lasix to IV twice daily 40 mg, plus metolazone Recheck BMP in the morning DVT study pending  Uncontrolled hypertension As mentioned above,  will try low-dose of labetalol PRN (MD aware of Coreg allergy)  Acute on chronic anemia secondary to chronic kidney disease At least partly contribute to her breathing symptoms, will give one unit of PRBC given she is in decompensation of CHF, watch for overload Check reticulocyte count, her iron level was checked within normal limits recently. Consider epoetin  CKD stage IIIb Creatinine level stable, has signs of fluid overload, will increase diuresis and recheck BMP in the morning  IIDM Switch to sliding scale for now  Symptomatic cholelithiasis Patient was evaluated by cardiologist in February and cleared for laparoscopic cholecystectomy, had a rather normal Lexiscan May 2020.   DVT prophylaxis: Heparin subcu Code Status: Full code Family Communication: Granddaughter at bedside Disposition Plan: Likely will need 2 to 3 days hospital stay until her symptoms improved, also add PT evaluation Consults called: Cardio Admission status: Telemetry admission   Zoe Halt MD [facility] Pager [phone]   06/25/2019, 5:27 PM

## 2019-06-25 NOTE — ED Triage Notes (Signed)
Pt here with co of generalized swelling all over that started Sunday with increase pain in left leg. Hx of CHF and takes lasix. Last took lasix last night and states its not helping.

## 2019-06-25 NOTE — Progress Notes (Signed)
I was called to evaluate patient at PAT appointment due to worsening SOB. Pt states she has had progressive SOB since Sunday and worsening LE edema. She feels like she is fluid overloaded. Overall reports feeling quite unwell, worsening. She has taken her Lasix without improvement. She also reports worsening LLE pain for the past several days, pain from hip all the way down to her foot, currently acutely painful with manipulation. Husband reports he was worried about her last night as her breathing seemed labored and she has been struggling with multiple recent admissions and ED visits due to difficult to control CHF and HTN. BP elevated today 175/76. HR 84 and O2 sats 96% on RA. Brief records review shows echo 11/20 with EF 40-45% and grade 2 dd. She is followed by Dr. Oval Linsey for this. On exam she is somewhat dyspneic at rest. Difficulty completing sentences without stopping to catch her breath. Exam is limited 2/2 pt in wheelchair and essentially unable to adjust herself under her own power. Heart RRR. Lung sounds are diminished. 2+ pitting edema Bilateral LE. She is oriented to person and place but unsure what day it is. Discussed with pt and husband that I am concerned about CHF exacerbation and she would be best served with ED treatment. Also concern for DVT given LLE pain. Pt and husband in agreement and feel she needs to be seen in ED. Patient transported to ED via wheelchair by PAT RNs.   Wynonia Musty [facility] Short Stay Center/Anesthesiology Phone [phone] 06/25/2019 9:48 AM

## 2019-06-25 NOTE — Progress Notes (Signed)
Lower venous duplex       has been completed. Preliminary results can be found under CV proc through chart review. Tomeka Kantner, BS, RDMS, RVT   

## 2019-06-25 NOTE — Progress Notes (Signed)
Patient came for PAT appointment today. Pt presented with SOB stating she had been experiencing these symptoms since Sunday with worsening LLE edema and pain. Pt states she felt like she had too much fluid in her body and she could hear the fluid in her ears "whoosing" when she laid down. Pt stated she took her Lasix last night with no improvements. Pt also reported having chest pain on Monday that lasted 10 mins, but self resolved. PCP was not contacted, but pt stated she had a telehealth appt with PCP at 2pm today. Pt complained of increased pain and swelling in her LLE. BP elevated at 175/76 at PAT, HR 84, O2 96% RA. Labs drawn. Pt neuro exam A+Ox3, pt is numb in left pinky and left lateral hand/palm area. Provider Jeneen Rinks, PA called, concerns for DVT and CHF exacerbation mentioned, pt husband also present. Pt has cane and walker that husband is carrying. Pt transported to the ED via wheelchair with husband. Cane and walker with patient's husband, Gwyndolyn Saxon.

## 2019-06-25 NOTE — Consult Note (Signed)
CARDIOLOGY CONSULT NOTE       Patient ID: Zoe Clarke MRN: [id number] DOB/AGE: 1950-11-03 69 y.o.  Admit date: 06/25/2019 Referring Physician: zhang Primary Physician: Antony Contras, MD Primary Cardiologist: Oval Linsey Reason for Consultation: CHF  Active Problems:   CHF (congestive heart failure) ([place])   HPI:  69 y.o. morbidly obese black female with poorly controlled HTN and CRF.  Last TTE done 02/13/19 with EF 40-45% diffuse hypokinesis mild MR and TR. She has DM, asthma and baseline Cr around 3. She continues to smoke and in general does not take good care of herself.  Due to renal failure has not been cathed. Myovue done 07/2018 was non ischemic She has had biliary colic symptoms since January 2020 and was having preop labs today for cholecystectomy and noted dyspnea and sent to ER She indicates 2 days of swelling and more dyspnea but has poor functional status to begin with Mild cough no fever CXR showed no CHF Troponin chronically in 26-28 range with renal failure BNP 810 but was as high as 2250 4 months ago Hct has dropped from 27.8 to 25.1 denies melena ECG is normal SR rate 80 Her HTN is never seemingly controlled She has norvasc, coreg, clonidine and lisinopril as allergies She says she is compliant with her meds    ROS All other systems reviewed and negative except as noted above  Past Medical History:  Diagnosis Date  . Allergy   . Arthritis   . Asthma    weazing at night uses inhalers  . Bronchitis    history of  . Chronic diastolic CHF (congestive heart failure) ([place]) 04/2018  . CKD stage 4 due to type 2 diabetes mellitus ([place])   . Diabetes mellitus without complication ([place])   . Gastric ulcer   . GERD (gastroesophageal reflux disease)   . History of hiatal hernia   . Hypertension   . Osteoporosis   . Sleep apnea 09/22/2018    Family History  Problem Relation Age of Onset  . Arthritis Mother   . Hypertension Mother   . Heart failure Mother   .  Emphysema Mother   . Arthritis Maternal Grandmother   . Hypertension Maternal Grandmother   . Stroke Maternal Grandfather   . Cancer Sister        unknown type cancer  . Heart failure Sister   . Lung disease Sister   . Heart failure Sister   . Lupus Child   . Colon cancer Neg Hx   . Esophageal cancer Neg Hx   . Stomach cancer Neg Hx   . Rectal cancer Neg Hx     Social History   Socioeconomic History  . Marital status: Married    Spouse name: Not on file  . Number of children: 2  . Years of education: 26  . Highest education level: High school graduate  Occupational History  . Occupation: Housewife  Tobacco Use  . Smoking status: Current Every Day Smoker    Packs/day: 0.50    Years: 39.00    Pack years: 19.50    Types: Cigarettes  . Smokeless tobacco: Never Used  . Tobacco comment: 1 pack q 3-4 days  Substance and Sexual Activity  . Alcohol use: No    Alcohol/week: 0.0 standard drinks  . Drug use: Not Currently    Types: Marijuana  . Sexual activity: Not on file  Other Topics Concern  . Not on file  Social History Narrative   Fun: Race video car  games - PS2/3, paint by number, board and card games, puzzle books   Feels safe at home and denies abuse   Disabled - does not work from chronic pain   Children - 1 living children, 3 step children   Grandchildren - 33   Married - lives with husband   Uses [employer] transportation   Social Determinants of Health   Financial Resource Strain:   . Difficulty of Paying Living Expenses:   Food Insecurity:   . Worried About Charity fundraiser in the Last Year:   . Arboriculturist in the Last Year:   Transportation Needs:   . Film/video editor (Medical):   Marland Kitchen Lack of Transportation (Non-Medical):   Physical Activity:   . Days of Exercise per Week:   . Minutes of Exercise per Session:   Stress:   . Feeling of Stress :   Social Connections:   . Frequency of Communication with Friends and Family:   . Frequency of Social  Gatherings with Friends and Family:   . Attends Religious Services:   . Active Member of Clubs or Organizations:   . Attends Archivist Meetings:   Marland Kitchen Marital Status:   Intimate Partner Violence:   . Fear of Current or Ex-Partner:   . Emotionally Abused:   Marland Kitchen Physically Abused:   . Sexually Abused:     Past Surgical History:  Procedure Laterality Date  . DILATION AND CURETTAGE OF UTERUS    . EYE SURGERY Left    removed cataract w/ lens  . TUBAL LIGATION        Current Facility-Administered Medications:  .  0.9 %  sodium chloride infusion (Manually program via Guardrails IV Fluids), , Intravenous, Once, Wynetta Fines T, MD .  0.9 %  sodium chloride infusion, 250 mL, Intravenous, PRN, Wynetta Fines T, MD .  acetaminophen (TYLENOL) tablet 650 mg, 650 mg, Oral, Q4H PRN, Lequita Halt, MD .  Derrill Memo ON 06/26/2019] furosemide (LASIX) injection 40 mg, 40 mg, Intravenous, BID, Wynetta Fines T, MD .  heparin injection 5,000 Units, 5,000 Units, Subcutaneous, Q8H, Zhang, Ping T, MD .  hydrALAZINE (APRESOLINE) tablet 100 mg, 100 mg, Oral, Q8H, Wynetta Fines T, MD .  Derrill Memo ON 06/26/2019] insulin aspart (novoLOG) injection 0-9 Units, 0-9 Units, Subcutaneous, TID WC, Wynetta Fines T, MD .  isosorbide mononitrate (IMDUR) 24 hr tablet 60 mg, 60 mg, Oral, Daily, Zhang, Pearletha Forge T, MD .  labetalol (NORMODYNE) injection 5 mg, 5 mg, Intravenous, Q4H PRN, Wynetta Fines T, MD .  metolazone (ZAROXOLYN) tablet 5 mg, 5 mg, Oral, Daily, Wynetta Fines T, MD .  ondansetron [facility]) injection 4 mg, 4 mg, Intravenous, Q6H PRN, Wynetta Fines T, MD .  sodium chloride flush (NS) 0.9 % injection 3 mL, 3 mL, Intravenous, Q12H, Zhang, Ping T, MD .  sodium chloride flush (NS) 0.9 % injection 3 mL, 3 mL, Intravenous, PRN, Wynetta Fines T, MD . sodium chloride   Intravenous Once  . [START ON 06/26/2019] furosemide  40 mg Intravenous BID  . heparin  5,000 Units Subcutaneous Q8H  . hydrALAZINE  100 mg Oral Q8H  . [START ON 06/26/2019]  insulin aspart  0-9 Units Subcutaneous TID WC  . isosorbide mononitrate  60 mg Oral Daily  . metolazone  5 mg Oral Daily  . sodium chloride flush  3 mL Intravenous Q12H   . sodium chloride      Physical Exam: Blood pressure (!) 180/65, pulse 71, temperature 98.1  F (36.7 C), temperature source Oral, resp. rate 19, SpO2 100 %.   Flat affect  Chronically ill obese black female  HEENT: normal Neck supple with no adenopathy JVP normal no bruits no thyromegaly Lungs clear with no wheezing and good diaphragmatic motion Heart:  S1/S2 no murmur, no rub, gallop or click PMI normal Abdomen: soft some RUQ pain to palpation  no bruit.  No HSM or HJR Distal pulses intact with no bruits Trace LE edema Neuro non-focal Skin warm and dry No muscular weakness   Labs:   Lab Results  Component Value Date   WBC 7.4 06/25/2019   HGB 7.6 (L) 06/25/2019   HCT 25.1 (L) 06/25/2019   MCV 91.6 06/25/2019   PLT 199 06/25/2019    Recent Labs  Lab 06/25/19 0959  NA 143  K 4.1  CL 110  CO2 23  BUN 37*  CREATININE 2.72*  CALCIUM 8.3*  GLUCOSE 159*   Lab Results  Component Value Date   TROPONINI <0.03 07/30/2018    Lab Results  Component Value Date   CHOL 103 12/20/2018   CHOL 124 09/25/2018   CHOL 149 08/25/2015   Lab Results  Component Value Date   HDL 35 (L) 12/20/2018   HDL 46 09/25/2018   HDL 47 08/25/2015   Lab Results  Component Value Date   LDLCALC 54 12/20/2018   LDLCALC 55 09/25/2018   LDLCALC 84 08/25/2015   Lab Results  Component Value Date   TRIG 68 12/20/2018   TRIG 115 09/25/2018   TRIG 90 08/25/2015   Lab Results  Component Value Date   CHOLHDL 2.9 12/20/2018   CHOLHDL 2.7 09/25/2018   CHOLHDL 3.2 08/25/2015   No results found for: LDLDIRECT    Radiology: DG Chest Portable 1 View  Result Date: 06/25/2019 CLINICAL DATA:  Shortness of breath EXAM: PORTABLE CHEST 1 VIEW COMPARISON:  February 13, 2019 FINDINGS: Lungs are clear. Heart is mildly  enlarged with pulmonary vascularity normal. No adenopathy. No bone lesions. IMPRESSION: Mild cardiac prominence.  Lungs clear.  No adenopathy evident. Electronically Signed   By: Lowella Grip III M.D.   On: 06/25/2019 11:28   VAS [place] LOWER EXTREMITY VENOUS (DVT) (ONLY [facility] & [facility])  Result Date: 06/25/2019  Lower Venous DVTStudy Indications: Swelling.  Comparison Study: no prior Performing Technologist: June Leap RDMS, RVT  Examination Guidelines: A complete evaluation includes B-mode imaging, spectral Doppler, color Doppler, and power Doppler as needed of all accessible portions of each vessel. Bilateral testing is considered an integral part of a complete examination. Limited examinations for reoccurring indications may be performed as noted. The reflux portion of the exam is performed with the patient in reverse Trendelenburg.  +-----+---------------+---------+-----------+----------+--------------+ RIGHTCompressibilityPhasicitySpontaneityPropertiesThrombus Aging +-----+---------------+---------+-----------+----------+--------------+ CFV  Full           Yes      Yes                                 +-----+---------------+---------+-----------+----------+--------------+   +---------+---------------+---------+-----------+----------+--------------+ LEFT     CompressibilityPhasicitySpontaneityPropertiesThrombus Aging +---------+---------------+---------+-----------+----------+--------------+ CFV      Full           Yes      Yes                                 +---------+---------------+---------+-----------+----------+--------------+ SFJ      Full                                                        +---------+---------------+---------+-----------+----------+--------------+  FV Prox  Full                                                        +---------+---------------+---------+-----------+----------+--------------+ FV Mid   Full                                                         +---------+---------------+---------+-----------+----------+--------------+ FV DistalFull                                                        +---------+---------------+---------+-----------+----------+--------------+ PFV      Full                                                        +---------+---------------+---------+-----------+----------+--------------+ POP      Full           Yes      Yes                                 +---------+---------------+---------+-----------+----------+--------------+ PTV      Full                                                        +---------+---------------+---------+-----------+----------+--------------+ PERO     Full                                                        +---------+---------------+---------+-----------+----------+--------------+     Summary: RIGHT: - No evidence of common femoral vein obstruction.  LEFT: - There is no evidence of deep vein thrombosis in the lower extremity.  - No cystic structure found in the popliteal fossa.  *See table(s) above for measurements and observations. Electronically signed by Curt Jews MD on 06/25/2019 at 3:13:04 PM.    Final     EKG: NSR rate 80 normal    ASSESSMENT AND PLAN:   1. CHF:  Biggest issue is poorly controlled BP, renal failure and drug intolerances to Rx her BP with . Although her BNP is up she does not appear very volume overloaded taking into account her obesity. She normally takes 80 mg lasix bid Continue in hospital in iv form  2. CRF:  Complicates Rx Cr 2.7 follow urine output see below  3. Anemia:  Chronic from CRF but has dropped and likely contributes to her dyspnea Transfuse per primary service check labs and stool Should be on Procrit as outpatient   4.  HTN:  Iv lasix continue high dose hydralazine tid , imdur and cardura  5. DM:  A1c pending poor diet continue home meds   6. GI:  Has had smoldering biliary colic will need to be  tuned up in regarding to fluid status, anemia and renal failure before she can have elective GB surgery   Signed: Jenkins Rouge 06/25/2019, 6:26 PM

## 2019-06-25 NOTE — ED Provider Notes (Signed)
[facility] EMERGENCY DEPARTMENT Provider Note   CSN: [id number] Arrival date & time: 06/25/19  0930     History Chief Complaint  Patient presents with   Edema   left leg numbness    Zoe Clarke is a 69 y.o. female.  Pt presents to the ED today with SOB.  She said it started yesterday.  She feels like she is swollen all over.  She has a hx of CHF and took her lasix last night, but does not feel like it is helping.  Pt was here getting pre-op labs to get her gallbladder taken out last night.  They noticed her sob and sent her here.  Pt was so sob she could not get out of her wheelchair without multiple helpers.  Pt denies f/c.  No known covid exposures.        Past Medical History:  Diagnosis Date   Allergy    Arthritis    Asthma    weazing at night uses inhalers   Bronchitis    history of   Chronic diastolic CHF (congestive heart failure) ([place]) 04/2018   CKD stage 4 due to type 2 diabetes mellitus ([place])    Diabetes mellitus without complication ([place])    Gastric ulcer    GERD (gastroesophageal reflux disease)    History of hiatal hernia    Hypertension    Osteoporosis    Sleep apnea 09/22/2018    Patient Active Problem List   Diagnosis Date Noted   Acute on chronic congestive heart failure ([place]) 02/13/2019   Hyperkalemia 02/13/2019   Elevated liver enzymes 02/13/2019   Atelectasis 12/22/2018   CAP (community acquired pneumonia) 12/20/2018   Obesity, Class III, BMI 40-49.9 (morbid obesity) ([place]) 12/20/2018   Marijuana abuse 12/20/2018   Dysuria 12/20/2018   Chronic diastolic CHF (congestive heart failure) ([place]) 11/06/2018   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 ([place]) 09/22/2018   Thyroid nodule 09/22/2018   Anemia, chronic renal failure, stage 3 (moderate) 09/22/2018   Sleep apnea 09/22/2018   Precordial  chest pain    Cholelithiasis 07/30/2018   Acute on chronic combined systolic (congestive) and diastolic (congestive) heart failure ([place]) 07/29/2018   Dyspnea 07/29/2018   Tobacco abuse counseling 04/27/2018   Acute pyelonephritis 04/10/2018   Hypertensive urgency 04/10/2018   Leukocytosis 09/10/2017   Anemia of chronic disease 09/10/2017   Knee pain, bilateral 01/13/2015   Bilateral hip pain 11/23/2014   Diabetes mellitus ([place]) 10/25/2014   Intertrigo 10/25/2014   GERD (gastroesophageal reflux disease) 10/25/2014   Diabetic neuropathy ([place]) 10/25/2014   Chronic low back pain 10/25/2014   Tobacco abuse 11/11/2013   CKD (chronic kidney disease), stage III 09/02/2013   Gout 12/05/2012    Past Surgical History:  Procedure Laterality Date   DILATION AND CURETTAGE OF UTERUS     EYE SURGERY Left    removed cataract w/ lens   TUBAL LIGATION       OB History   No obstetric history on file.     Family History  Problem Relation Age of Onset   Arthritis Mother    Hypertension Mother    Heart failure Mother    Emphysema Mother    Arthritis Maternal Grandmother    Hypertension Maternal Grandmother    Stroke Maternal Grandfather    Cancer Sister        unknown type cancer   Heart failure Sister  Lung disease Sister    Heart failure Sister    Lupus Child    Colon cancer Neg Hx    Esophageal cancer Neg Hx    Stomach cancer Neg Hx    Rectal cancer Neg Hx     Social History   Tobacco Use   Smoking status: Current Every Day Smoker    Packs/day: 0.50    Years: 39.00    Pack years: 19.50    Types: Cigarettes   Smokeless tobacco: Never Used   Tobacco comment: 1 pack q 3-4 days  Substance Use Topics   Alcohol use: No    Alcohol/week: 0.0 standard drinks   Drug use: Not Currently    Types: Marijuana    Home Medications Prior to Admission medications   Medication Sig Start Date End Date Taking? Authorizing Provider    albuterol (PROVENTIL HFA;VENTOLIN HFA) 108 (90 Base) MCG/ACT inhaler Inhale 2 puffs into the lungs every 6 (six) hours as needed for wheezing or shortness of breath. 04/14/18   Bonnell Public, MD  atorvastatin (LIPITOR) 40 MG tablet Take 40 mg by mouth daily.    [provider]  cyclobenzaprine (FLEXERIL) 10 MG tablet Take 10 mg by mouth at bedtime.     [provider]  diclofenac Sodium (VOLTAREN) 1 % GEL Apply 2 g topically 4 (four) times daily. Patient not taking: Reported on 06/17/2019 02/18/19   Geradine Girt, DO  doxazosin (CARDURA) 4 MG tablet Take 4 mg by mouth daily.    [provider]  ergocalciferol (VITAMIN D2) 1.25 MG (50000 UT) capsule Take 50,000 Units by mouth every Friday.     [provider]  ferrous sulfate 325 (65 FE) MG tablet Take 325 mg by mouth daily with breakfast.    [provider]  fexofenadine (ALLEGRA) 180 MG tablet Take 180 mg by mouth daily as needed for allergies.     [provider]  furosemide (LASIX) 80 MG tablet Take 1 tablet (80 mg total) by mouth 2 (two) times daily. 02/18/19   Geradine Girt, DO  gabapentin (NEURONTIN) 300 MG capsule Take 1 capsule (300 mg total) by mouth 2 (two) times daily. Patient taking differently: Take 900 mg by mouth 3 (three) times daily.  02/18/19   Geradine Girt, DO  glipiZIDE (GLUCOTROL XL) 10 MG 24 hr tablet Take 10 mg by mouth in the morning and at bedtime.     [provider]  hydrALAZINE (APRESOLINE) 100 MG tablet Take 1 tablet (100 mg total) by mouth 3 (three) times daily. 02/18/19   Geradine Girt, DO  hydrocortisone cream 1 % Apply topically 2 (two) times daily. Areas of itch Patient taking differently: Apply 1 application topically 2 (two) times daily as needed for itching.  09/30/18   Pokhrel, Corrie Mckusick, MD  isosorbide mononitrate (IMDUR) 30 MG 24 hr tablet Take 3 tablets (90 mg total) by mouth daily. 02/19/19   Geradine Girt, DO  linaclotide (LINZESS) 145  MCG CAPS capsule Take 145 mcg by mouth daily as needed (constipation).     [provider]  oxyCODONE-acetaminophen (PERCOCET/ROXICET) 5-325 MG tablet Take 1 tablet by mouth every 6 (six) hours as needed for moderate pain or severe pain.     [provider]  pantoprazole (PROTONIX) 40 MG tablet Take 1 tablet (40 mg total) by mouth daily. 07/14/18   Skeet Latch, MD  polyethylene glycol [facility] [name] packet Take 17 g by mouth daily as needed for mild  constipation. Patient not taking: Reported on 06/17/2019 04/14/18   Dana Allan I, MD  triamcinolone cream (KENALOG) 0.1 % Apply 1 application topically daily as needed (itching).     [provider]  triamcinolone ointment (KENALOG) 0.1 % Apply 1 application topically daily as needed (ear itching).    [provider]    Allergies    Aspirin, Allopurinol, Amlodipine, Carvedilol, Clonidine derivatives, Lisinopril, and Other  Review of Systems   Review of Systems  Respiratory: Positive for shortness of breath.   Cardiovascular: Positive for leg swelling.  Neurological: Positive for weakness.  All other systems reviewed and are negative.   Physical Exam Updated Vital Signs BP (!) 202/72    Pulse 92    Temp 98.1 F (36.7 C) (Oral)    Resp 17    SpO2 100%   Physical Exam Vitals and nursing note reviewed.  Constitutional:      Appearance: Normal appearance. She is obese.  HENT:     Head: Normocephalic and atraumatic.     Right Ear: External ear normal.     Left Ear: External ear normal.     Nose: Nose normal.     Mouth/Throat:     Mouth: Mucous membranes are moist.     Pharynx: Oropharynx is clear.  Eyes:     Extraocular Movements: Extraocular movements intact.     Conjunctiva/sclera: Conjunctivae normal.     Pupils: Pupils are equal, round, and reactive to light.  Cardiovascular:     Rate and Rhythm: Normal rate and regular rhythm.     Pulses: Normal pulses.     Heart sounds: Normal  heart sounds.  Pulmonary:     Effort: Tachypnea present.  Abdominal:     General: Abdomen is flat. Bowel sounds are normal.     Palpations: Abdomen is soft.  Musculoskeletal:        General: Normal range of motion.     Cervical back: Normal range of motion and neck supple.  Skin:    General: Skin is warm.     Capillary Refill: Capillary refill takes less than 2 seconds.  Neurological:     General: No focal deficit present.     Mental Status: She is alert and oriented to person, place, and time.  Psychiatric:        Mood and Affect: Mood normal.        Behavior: Behavior normal.        Thought Content: Thought content normal.        Judgment: Judgment normal.     ED Results / Procedures / Treatments   Labs (all labs ordered are listed, but only abnormal results are displayed) Labs Reviewed  BASIC METABOLIC PANEL - Abnormal; Notable for the following components:      Result Value   Glucose, Bld 159 (*)    BUN 37 (*)    Creatinine, Ser 2.72 (*)    Calcium 8.3 (*)    GFR calc non Af Amer 17 (*)    GFR calc Af Amer 20 (*)    All other components within normal limits  CBC - Abnormal; Notable for the following components:   RBC 2.74 (*)    Hemoglobin 7.6 (*)    HCT 25.1 (*)    RDW 16.1 (*)    All other components within normal limits  BRAIN NATRIURETIC PEPTIDE - Abnormal; Notable for the following components:   B Natriuretic Peptide 819.9 (*)    All other components within normal  limits  TROPONIN I (HIGH SENSITIVITY) - Abnormal; Notable for the following components:   Troponin I (High Sensitivity) 29 (*)    All other components within normal limits  RESPIRATORY PANEL BY RT PCR (FLU A&B, COVID)  POC OCCULT BLOOD, ED  TYPE AND SCREEN  TROPONIN I (HIGH SENSITIVITY)    EKG None  Radiology DG Chest Portable 1 View  Result Date: 06/25/2019 CLINICAL DATA:  Shortness of breath EXAM: PORTABLE CHEST 1 VIEW COMPARISON:  February 13, 2019 FINDINGS: Lungs are clear. Heart is  mildly enlarged with pulmonary vascularity normal. No adenopathy. No bone lesions. IMPRESSION: Mild cardiac prominence.  Lungs clear.  No adenopathy evident. Electronically Signed   By: Lowella Grip III M.D.   On: 06/25/2019 11:28   VAS [place] LOWER EXTREMITY VENOUS (DVT) (ONLY [facility] & [facility])  Result Date: 06/25/2019  Lower Venous DVTStudy Indications: Swelling.  Comparison Study: no prior Performing Technologist: June Leap RDMS, RVT  Examination Guidelines: A complete evaluation includes B-mode imaging, spectral Doppler, color Doppler, and power Doppler as needed of all accessible portions of each vessel. Bilateral testing is considered an integral part of a complete examination. Limited examinations for reoccurring indications may be performed as noted. The reflux portion of the exam is performed with the patient in reverse Trendelenburg.  +-----+---------------+---------+-----------+----------+--------------+  RIGHT Compressibility Phasicity Spontaneity Properties Thrombus Aging  +-----+---------------+---------+-----------+----------+--------------+  CFV   Full            Yes       Yes                                    +-----+---------------+---------+-----------+----------+--------------+   +---------+---------------+---------+-----------+----------+--------------+  LEFT      Compressibility Phasicity Spontaneity Properties Thrombus Aging  +---------+---------------+---------+-----------+----------+--------------+  CFV       Full            Yes       Yes                                    +---------+---------------+---------+-----------+----------+--------------+  SFJ       Full                                                             +---------+---------------+---------+-----------+----------+--------------+  FV Prox   Full                                                             +---------+---------------+---------+-----------+----------+--------------+  FV Mid    Full                                                              +---------+---------------+---------+-----------+----------+--------------+  FV Distal Full                                                             +---------+---------------+---------+-----------+----------+--------------+  PFV       Full                                                             +---------+---------------+---------+-----------+----------+--------------+  POP       Full            Yes       Yes                                    +---------+---------------+---------+-----------+----------+--------------+  PTV       Full                                                             +---------+---------------+---------+-----------+----------+--------------+  PERO      Full                                                             +---------+---------------+---------+-----------+----------+--------------+     Summary: RIGHT: - No evidence of common femoral vein obstruction.  LEFT: - There is no evidence of deep vein thrombosis in the lower extremity.  - No cystic structure found in the popliteal fossa.  *See table(s) above for measurements and observations.    Preliminary     Procedures Procedures (including critical care time)  Medications Ordered in ED Medications  hydrALAZINE (APRESOLINE) tablet 100 mg (100 mg Oral Given 06/25/19 1157)  furosemide (LASIX) injection 80 mg (80 mg Intravenous Given 06/25/19 1320)  morphine 4 MG/ML injection 4 mg (4 mg Intravenous Given 06/25/19 1404)  ondansetron (ZOFRAN) injection 4 mg (4 mg Intravenous Given 06/25/19 1404)    ED Course  I have reviewed the triage vital signs and the nursing notes.  Pertinent labs & imaging results that were available during my care of the patient were reviewed by me and considered in my medical decision making (see chart for details).    MDM Rules/Calculators/A&P                      Pt very sob with any kind of movement.  She was given Lasix IV and is feeling a little better.  She does  not have a DVT.  Hgb is lower than normal.  Stool is guaiac neg.  Anemia likely due to CKD.  Pt may need some blood to help with sob as CXR is clear.  Pt d/w Dr. Roosevelt Locks (triad) for admission.  Pt's Covid PCR negative.   Final Clinical Impression(s) / ED Diagnoses Final diagnoses:  Peripheral edema  DOE (dyspnea on exertion)  CKD (chronic kidney disease) stage 4, GFR 15-29 ml/min (HCC)  Anemia due to stage 4 chronic kidney disease ([place])  Essential hypertension  COVID-19 virus not detected    Rx / DC Orders ED Discharge Orders    None  Isla Pence, MD 06/25/19 1439

## 2019-06-25 NOTE — Progress Notes (Signed)
Received pt from ED, alert and oriented  SOB. Cards at bedside.

## 2019-06-26 ENCOUNTER — Inpatient Hospital Stay (HOSPITAL_COMMUNITY): Payer: PPO

## 2019-06-26 DIAGNOSIS — I5031 Acute diastolic (congestive) heart failure: Secondary | ICD-10-CM

## 2019-06-26 DIAGNOSIS — I5020 Unspecified systolic (congestive) heart failure: Secondary | ICD-10-CM

## 2019-06-26 LAB — IRON AND TIBC
Iron: 71 ug/dL (ref 28–170)
Saturation Ratios: 23 % (ref 10.4–31.8)
TIBC: 312 ug/dL (ref 250–450)
UIBC: 241 ug/dL

## 2019-06-26 LAB — RETICULOCYTES
Immature Retic Fract: 18.7 % — ABNORMAL HIGH (ref 2.3–15.9)
RBC.: 3.18 MIL/uL — ABNORMAL LOW (ref 3.87–5.11)
Retic Count, Absolute: 60.1 10*3/uL (ref 19.0–186.0)
Retic Ct Pct: 1.9 % (ref 0.4–3.1)

## 2019-06-26 LAB — TYPE AND SCREEN
ABO/RH(D): O POS
Antibody Screen: NEGATIVE
Unit division: 0

## 2019-06-26 LAB — BASIC METABOLIC PANEL
Anion gap: 9 (ref 5–15)
BUN: 38 mg/dL — ABNORMAL HIGH (ref 8–23)
CO2: 25 mmol/L (ref 22–32)
Calcium: 8.3 mg/dL — ABNORMAL LOW (ref 8.9–10.3)
Chloride: 106 mmol/L (ref 98–111)
Creatinine, Ser: 3.01 mg/dL — ABNORMAL HIGH (ref 0.44–1.00)
GFR calc Af Amer: 18 mL/min — ABNORMAL LOW (ref >60)
GFR calc non Af Amer: 15 mL/min — ABNORMAL LOW (ref >60)
Glucose, Bld: 159 mg/dL — ABNORMAL HIGH (ref 70–99)
Potassium: 5 mmol/L (ref 3.5–5.1)
Sodium: 140 mmol/L (ref 135–145)

## 2019-06-26 LAB — BLOOD GAS, ARTERIAL
Acid-base deficit: 1.2 mmol/L (ref 0.0–2.0)
Bicarbonate: 25.2 mmol/L (ref 20.0–28.0)
Drawn by: 519031
FIO2: 28
O2 Saturation: 96 %
Patient temperature: 37.3
pCO2 arterial: 60.6 mmHg — ABNORMAL HIGH (ref 32.0–48.0)
pH, Arterial: 7.245 — ABNORMAL LOW (ref 7.350–7.450)
pO2, Arterial: 84.4 mmHg (ref 83.0–108.0)

## 2019-06-26 LAB — BPAM RBC
Blood Product Expiration Date: 202105082359
ISSUE DATE / TIME: 202104082353
Unit Type and Rh: 5100

## 2019-06-26 LAB — HEMOGLOBIN A1C
Hgb A1c MFr Bld: 5.4 % (ref 4.8–5.6)
Mean Plasma Glucose: 108 mg/dL

## 2019-06-26 LAB — GLUCOSE, CAPILLARY
Glucose-Capillary: 158 mg/dL — ABNORMAL HIGH (ref 70–99)
Glucose-Capillary: 84 mg/dL (ref 70–99)

## 2019-06-26 LAB — FERRITIN: Ferritin: 64 ng/mL (ref 11–307)

## 2019-06-26 LAB — HEMOGLOBIN AND HEMATOCRIT, BLOOD
HCT: 30 % — ABNORMAL LOW (ref 36.0–46.0)
Hemoglobin: 9 g/dL — ABNORMAL LOW (ref 12.0–15.0)

## 2019-06-26 LAB — VITAMIN B12: Vitamin B-12: 212 pg/mL (ref 180–914)

## 2019-06-26 LAB — ECHOCARDIOGRAM COMPLETE
Height: 68 in
Weight: 3989.44 oz

## 2019-06-26 LAB — FOLATE: Folate: 9.5 ng/mL (ref >5.9)

## 2019-06-26 MED ORDER — FUROSEMIDE 10 MG/ML IJ SOLN
80.0000 mg | Freq: Two times a day (BID) | INTRAMUSCULAR | Status: DC
  Administered 2019-06-26 – 2019-06-27 (×3): 80 mg via INTRAVENOUS
  Filled 2019-06-26 (×3): qty 8

## 2019-06-26 NOTE — Progress Notes (Signed)
Entered patient's room to do an ABG, patient's husband is angry about the patient's nephrologist stating they lied to him. Patient's husband stated he was going to find a .67 gun and go to the office and the first person he saw was going to the ground. Notified charge RN of conversation with husband and called security to alert them of threat to staff.

## 2019-06-26 NOTE — Progress Notes (Signed)
RT found BIPAP on standby at this time. Vitals stable. RT will continue to monitor.

## 2019-06-26 NOTE — Progress Notes (Signed)
RT NOTES: ABG obtained and sent to lab. Lab tech notified.  

## 2019-06-26 NOTE — Progress Notes (Signed)
Patient refused CPAP tonight. Informed patient to call if changed her mind.

## 2019-06-26 NOTE — Progress Notes (Signed)
Echocardiogram 2D Echocardiogram has been performed.  [name] Zoe Clarke 06/26/2019, 8:47 AM

## 2019-06-26 NOTE — Progress Notes (Addendum)
PROGRESS NOTE    Zoe Clarke  ELF:[id number] DOB: 10-11-1950 DOA: 06/25/2019 PCP: Antony Contras, MD  Brief Narrative: Zoe Clarke is a 68 y.o. female with medical history significant of CKD stage IIIb, hypertension, cholelithiasis, morbid obesity, tobacco abuse, marijuana abuse, chronic diastolic congestive heart failure, anemia of chronic disease presents to emergency department due to worsening shortness of breath.  Her symptoms started about 2 days ago, exertional dyspnea with heaviness on the chest, mild intermittent cough nonproductive, no fever chills.  Patient also admitted she has trouble control her blood pressure, she has been on 3 different BP meds with poorly controlled blood pressure and she is allergic to several other BP meds.  She went to surgeon for preop labs for elective cholecystectomy next week, and was found very tachypneic and sent to the ED for evaluation. ED Course: Patient was found to have fluid overload, chest x-ray showed cardiomegaly, hemoglobin 7.6 compared to 8.9 4 months ago, Cre level remained stable   Assessment & Plan:   Metabolic encephalopathy Acute Hypercarbic resp failurre -Ordered ABG this a.m. which notes respiratory acidosis, likely secondary to CHF, plus underlying COPD, OSA  -BIPAP  -IV lasix  Acute on chronic diastolic CHF decompensation -Numerous allergies to meds namely Coreg lisinopril, clonidine and amlodipine  -Started on Lasix 40 mg twice daily this admission, poor response to diuretics so far, will increase dose to 80 mg twice daily -Follow I/O, daily weights -Transfused 1 unit of PRBC   Acute on chronic anemia -Hemoglobin 7.5 on admission, baseline closer to 8 -No overt bleeding history, check anemia panel, transfuse 1 unit of PRBC on admission -doubt she is on Procrit, followed by [facility] for CKD  Stage IV chronic kidney disease -Baseline creatinine in the 2.5-3 range, -Stable at baseline, monitor  with diuresis  DM stage 2 -CBGs are stable, p.o. intake is poor, hold long-acting insulin, sliding scale for now  Cholelithiasis -FU with Gen Surgery  DVT prophylaxis: Heparin subcu Code Status: Full code Family Communication: Granddaughter at bedside Disposition Plan: Likely will need 2 to 3 days hospital stay until her symptoms improved, also add PT evaluation Consults called: Cardio    Procedures:   Antimicrobials:    Subjective: -somnolent this am, complaints of shortness of breath  Objective: Vitals:   06/26/19 0043 06/26/19 0421 06/26/19 0809 06/26/19 1053  BP: (!) 131/58 (!) 153/54 139/68   Pulse: 85 92 97 96  Resp: 18 20 18 16   Temp: 98.9 F (37.2 C) 99.3 F (37.4 C) 99.1 F (37.3 C)   TempSrc: Oral Oral Oral   SpO2: 98% 99% 96% 99%  Weight:  113.1 kg    Height:        Intake/Output Summary (Last 24 hours) at 06/26/2019 1153 Last data filed at 06/26/2019 0430 Gross per 24 hour  Intake 1076 ml  Output 500 ml  Net 576 ml   Filed Weights   06/25/19 1824 06/26/19 0421  Weight: 110.2 kg 113.1 kg    Examination:  General exam:somonlent, arousable, oriented to self only Respiratory system: decreased BS at bases Cardiovascular system: S1 & S2 heard, RRR.  Gastrointestinal system: Abdomen is nondistended, soft and nontender.Normal bowel sounds heard. Extremities: 1plus edema Skin: No rashes Psychiatry: unable to assess   Data Reviewed:   CBC: Recent Labs  Lab 06/25/19 0914 06/25/19 0959 06/26/19 0621  WBC 7.1 7.4  --   HGB 7.5* 7.6* 9.0*  HCT 24.9* 25.1* 30.0*  MCV 91.9 91.6  --  PLT 187 199  --    Basic Metabolic Panel: Recent Labs  Lab 06/25/19 0914 06/25/19 0959 06/26/19 0621  NA 143 143 140  K 4.2 4.1 5.0  CL 111 110 106  CO2 22 23 25   GLUCOSE 166* 159* 159*  BUN 37* 37* 38*  CREATININE 2.71* 2.72* 3.01*  CALCIUM 8.3* 8.3* 8.3*   GFR: Estimated Creatinine Clearance: 23.9 mL/min (A) (by C-G formula based on SCr of 3.01 mg/dL  (H)). Liver Function Tests: No results for input(s): AST, ALT, ALKPHOS, BILITOT, PROT, ALBUMIN in the last 168 hours. No results for input(s): LIPASE, AMYLASE in the last 168 hours. No results for input(s): AMMONIA in the last 168 hours. Coagulation Profile: No results for input(s): INR, PROTIME in the last 168 hours. Cardiac Enzymes: No results for input(s): CKTOTAL, CKMB, CKMBINDEX, TROPONINI in the last 168 hours. BNP (last 3 results) No results for input(s): PROBNP in the last 8760 hours. HbA1C: Recent Labs    06/25/19 0914  HGBA1C 5.4   CBG: Recent Labs  Lab 06/25/19 0828 06/25/19 1840 06/25/19 2115 06/26/19 0626  GLUCAP 153* 77 102* 158*   Lipid Profile: No results for input(s): CHOL, HDL, LDLCALC, TRIG, CHOLHDL, LDLDIRECT in the last 72 hours. Thyroid Function Tests: No results for input(s): TSH, T4TOTAL, FREET4, T3FREE, THYROIDAB in the last 72 hours. Anemia Panel: Recent Labs    06/26/19 0756  VITAMINB12 212  FOLATE 9.5  FERRITIN 64  TIBC 312  IRON 71  RETICCTPCT 1.9   Urine analysis:    Component Value Date/Time   COLORURINE YELLOW 02/13/2019 1103   APPEARANCEUR CLEAR 02/13/2019 1103   LABSPEC 1.013 02/13/2019 1103   PHURINE 6.0 02/13/2019 1103   GLUCOSEU 50 (A) 02/13/2019 1103   HGBUR NEGATIVE 02/13/2019 1103   BILIRUBINUR NEGATIVE 02/13/2019 1103   BILIRUBINUR negative 02/25/2015 1426   KETONESUR NEGATIVE 02/13/2019 1103   PROTEINUR >=300 (A) 02/13/2019 1103   UROBILINOGEN 0.2 02/25/2015 1426   UROBILINOGEN 1.0 04/16/2009 0943   NITRITE NEGATIVE 02/13/2019 1103   LEUKOCYTESUR NEGATIVE 02/13/2019 1103   Sepsis Labs: @LABRCNTIP (procalcitonin:4,lacticidven:4)  ) Recent Results (from the past 240 hour(s))  Respiratory Panel by RT PCR (Flu A&B, Covid) - Nasopharyngeal Swab     Status: None   Collection Time: 06/25/19 11:51 AM   Specimen: Nasopharyngeal Swab  Result Value Ref Range Status   SARS Coronavirus 2 by RT PCR NEGATIVE NEGATIVE Final      Comment: (NOTE) SARS-CoV-2 target nucleic acids are NOT DETECTED. The SARS-CoV-2 RNA is generally detectable in upper respiratoy specimens during the acute phase of infection. The lowest concentration of SARS-CoV-2 viral copies this assay can detect is 131 copies/mL. A negative result does not preclude SARS-Cov-2 infection and should not be used as the sole basis for treatment or other patient management decisions. A negative result may occur with  improper specimen collection/handling, submission of specimen other than nasopharyngeal swab, presence of viral mutation(s) within the areas targeted by this assay, and inadequate number of viral copies (<131 copies/mL). A negative result must be combined with clinical observations, patient history, and epidemiological information. The expected result is Negative. Fact Sheet for Patients:  PinkCheek.be Fact Sheet for Healthcare Providers:  GravelBags.it This test is not yet ap proved or cleared by the [place] FDA and  has been authorized for detection and/or diagnosis of SARS-CoV-2 by FDA under an Emergency Use Authorization (EUA). This EUA will remain  in effect (meaning this test can be used) for the duration of  the COVID-19 declaration under Section 564(b)(1) of the Act, 21 U.S.C. section 360bbb-3(b)(1), unless the authorization is terminated or revoked sooner.    Influenza A by PCR NEGATIVE NEGATIVE Final   Influenza B by PCR NEGATIVE NEGATIVE Final    Comment: (NOTE) The Xpert Xpress SARS-CoV-2/FLU/RSV assay is intended as an aid in  the diagnosis of influenza from Nasopharyngeal swab specimens and  should not be used as a sole basis for treatment. Nasal washings and  aspirates are unacceptable for Xpert Xpress SARS-CoV-2/FLU/RSV  testing. Fact Sheet for Patients: PinkCheek.be Fact Sheet for Healthcare  Providers: GravelBags.it This test is not yet approved or cleared by the [place] FDA and  has been authorized for detection and/or diagnosis of SARS-CoV-2 by  FDA under an Emergency Use Authorization (EUA). This EUA will remain  in effect (meaning this test can be used) for the duration of the  Covid-19 declaration under Section 564(b)(1) of the Act, 21  U.S.C. section 360bbb-3(b)(1), unless the authorization is  terminated or revoked. Performed at [facility], [place] [address], [place], [place] [address]          Radiology Studies: DG CHEST PORT 1 VIEW  Result Date: 06/26/2019 CLINICAL DATA:  Congestive heart failure. EXAM: PORTABLE CHEST 1 VIEW COMPARISON:  06/25/2019 FINDINGS: The heart is enlarged but stable. Stable central vascular congestion without overt pulmonary edema. No definite pleural effusions. IMPRESSION: Cardiac enlargement and central vascular congestion but no edema or effusions. Electronically Signed   By: Marijo Sanes M.D.   On: 06/26/2019 08:59   DG Chest Portable 1 View  Result Date: 06/25/2019 CLINICAL DATA:  Shortness of breath EXAM: PORTABLE CHEST 1 VIEW COMPARISON:  February 13, 2019 FINDINGS: Lungs are clear. Heart is mildly enlarged with pulmonary vascularity normal. No adenopathy. No bone lesions. IMPRESSION: Mild cardiac prominence.  Lungs clear.  No adenopathy evident. Electronically Signed   By: Lowella Grip III M.D.   On: 06/25/2019 11:28   ECHOCARDIOGRAM COMPLETE  Result Date: 06/26/2019    ECHOCARDIOGRAM REPORT   Patient Name:   Zoe Clarke Date of Exam: 06/26/2019 Medical Rec #:  [id number]           Height:       68.0 in Accession #:    [id number]          Weight:       249.3 lb Date of Birth:  1951/02/26           BSA:          2.245 m Patient Age:    65 years            BP:           153/54 mmHg Patient Gender: F                   HR:           98 bpm. Exam Location:  Inpatient Procedure: 2D Echo, Color  Doppler and Cardiac Doppler Indications:    I33.82 Acute diastolic (congestive) heart failure  History:        Patient has prior history of Echocardiogram examinations, most                 recent 02/13/2019. CHF; Risk Factors:Hypertension, Diabetes and                 Sleep Apnea.  Sonographer:    Raquel Sarna Senior RDCS Referring Phys: [id number] Impact  1. Left ventricular ejection fraction, by estimation, is 60 to 65%. The left ventricle has normal function. The left ventricle has no regional wall motion abnormalities. Left ventricular diastolic parameters are consistent with Grade I diastolic dysfunction (impaired relaxation). There is evidence of elevated mean left atrial pressure.  2. Right ventricular systolic function is normal. The right ventricular size is normal. There is mildly elevated pulmonary artery systolic pressure.  3. The mitral valve is normal in structure. No evidence of mitral valve regurgitation. No evidence of mitral stenosis.  4. The aortic valve is normal in structure. Aortic valve regurgitation is mild to moderate. No aortic stenosis is present.  5. The inferior vena cava is normal in size with greater than 50% respiratory variability, suggesting right atrial pressure of 3 mmHg.  6. Left atrial size was mildly dilated.  7. Right atrial size was mildly dilated. Comparison(s): Prior images reviewed side by side. Changes from prior study are noted. The left ventricular function has improved. Mitral insufficiency is no longer seen and PA systolic presure is lower. There is still evidence of diastolic dysfunction with elevated mean left atrial pressure, although improved from the previous study. FINDINGS  Left Ventricle: Left ventricular ejection fraction, by estimation, is 60 to 65%. The left ventricle has normal function. The left ventricle has no regional wall motion abnormalities. The left ventricular internal cavity size was normal in size. There is  no left ventricular  hypertrophy. Left ventricular diastolic parameters are consistent with Grade I diastolic dysfunction (impaired relaxation). Increased E/e' ratio is consistent with high mean left atrial pressure. Right Ventricle: The right ventricular size is normal. No increase in right ventricular wall thickness. Right ventricular systolic function is normal. There is mildly elevated pulmonary artery systolic pressure. The tricuspid regurgitant velocity is 2.48  m/s, and with an assumed right atrial pressure of 15 mmHg, the estimated right ventricular systolic pressure is 70.0 mmHg. Left Atrium: Left atrial size was mildly dilated. Right Atrium: Right atrial size was mildly dilated. Pericardium: There is no evidence of pericardial effusion. Mitral Valve: The mitral valve is normal in structure. Normal mobility of the mitral valve leaflets. No evidence of mitral valve regurgitation. No evidence of mitral valve stenosis. Tricuspid Valve: The tricuspid valve is normal in structure. Tricuspid valve regurgitation is mild . No evidence of tricuspid stenosis. Aortic Valve: The aortic valve is normal in structure. Aortic valve regurgitation is mild to moderate. No aortic stenosis is present. Pulmonic Valve: The pulmonic valve was grossly normal. Pulmonic valve regurgitation is not visualized. No evidence of pulmonic stenosis. Aorta: The aortic root is normal in size and structure. Venous: The inferior vena cava is normal in size with greater than 50% respiratory variability, suggesting right atrial pressure of 3 mmHg. IAS/Shunts: No atrial level shunt detected by color flow Doppler.  LEFT VENTRICLE PLAX 2D LVIDd:         3.80 cm  Diastology LVIDs:         3.00 cm  LV e' lateral:   7.07 cm/s LV PW:         1.40 cm  LV E/e' lateral: 15.1 LV IVS:        2.10 cm  LV e' medial:    4.68 cm/s LVOT diam:     2.20 cm  LV E/e' medial:  22.9 LV SV:         98 LV SV Index:   44 LVOT Area:     3.80 cm  RIGHT VENTRICLE RV S prime:  11.00 cm/s TAPSE  (M-mode): 1.8 cm LEFT ATRIUM             Index       RIGHT ATRIUM           Index LA diam:        4.10 cm 1.83 cm/m  RA Area:     20.50 cm LA Vol (A2C):   65.0 ml 28.96 ml/m RA Volume:   59.80 ml  26.64 ml/m LA Vol (A4C):   60.0 ml 26.73 ml/m LA Biplane Vol: 63.5 ml 28.29 ml/m  AORTIC VALVE LVOT Vmax:   135.00 cm/s LVOT Vmean:  93.800 cm/s LVOT VTI:    0.259 m  AORTA Ao Root diam: 3.50 cm Ao Asc diam:  3.20 cm MITRAL VALVE                TRICUSPID VALVE MV Area (PHT): 3.39 cm     TR Peak grad:   24.6 mmHg MV Decel Time: 224 msec     TR Vmax:        248.00 cm/s MV E velocity: 107.00 cm/s MV A velocity: 126.00 cm/s  SHUNTS MV E/A ratio:  0.85         Systemic VTI:  0.26 m                             Systemic Diam: 2.20 cm Dani Gobble Croitoru MD Electronically signed by Sanda Klein MD Signature Date/Time: 06/26/2019/11:22:50 AM    Final    VAS [place] LOWER EXTREMITY VENOUS (DVT) (ONLY [facility] & [facility])  Result Date: 06/25/2019  Lower Venous DVTStudy Indications: Swelling.  Comparison Study: no prior Performing Technologist: June Leap RDMS, RVT  Examination Guidelines: A complete evaluation includes B-mode imaging, spectral Doppler, color Doppler, and power Doppler as needed of all accessible portions of each vessel. Bilateral testing is considered an integral part of a complete examination. Limited examinations for reoccurring indications may be performed as noted. The reflux portion of the exam is performed with the patient in reverse Trendelenburg.  +-----+---------------+---------+-----------+----------+--------------+ RIGHTCompressibilityPhasicitySpontaneityPropertiesThrombus Aging +-----+---------------+---------+-----------+----------+--------------+ CFV  Full           Yes      Yes                                 +-----+---------------+---------+-----------+----------+--------------+   +---------+---------------+---------+-----------+----------+--------------+ LEFT      CompressibilityPhasicitySpontaneityPropertiesThrombus Aging +---------+---------------+---------+-----------+----------+--------------+ CFV      Full           Yes      Yes                                 +---------+---------------+---------+-----------+----------+--------------+ SFJ      Full                                                        +---------+---------------+---------+-----------+----------+--------------+ FV Prox  Full                                                        +---------+---------------+---------+-----------+----------+--------------+  FV Mid   Full                                                        +---------+---------------+---------+-----------+----------+--------------+ FV DistalFull                                                        +---------+---------------+---------+-----------+----------+--------------+ PFV      Full                                                        +---------+---------------+---------+-----------+----------+--------------+ POP      Full           Yes      Yes                                 +---------+---------------+---------+-----------+----------+--------------+ PTV      Full                                                        +---------+---------------+---------+-----------+----------+--------------+ PERO     Full                                                        +---------+---------------+---------+-----------+----------+--------------+     Summary: RIGHT: - No evidence of common femoral vein obstruction.  LEFT: - There is no evidence of deep vein thrombosis in the lower extremity.  - No cystic structure found in the popliteal fossa.  *See table(s) above for measurements and observations. Electronically signed by Curt Jews MD on 06/25/2019 at 3:13:04 PM.    Final         Scheduled Meds: . sodium chloride   Intravenous Once  . furosemide  40 mg Intravenous BID    . heparin  5,000 Units Subcutaneous Q8H  . hydrALAZINE  100 mg Oral Q8H  . insulin aspart  0-9 Units Subcutaneous TID WC  . isosorbide mononitrate  60 mg Oral Daily  . metolazone  5 mg Oral Daily  . sodium chloride flush  3 mL Intravenous Q12H   Continuous Infusions: . sodium chloride       LOS: 1 day    Time spent: 71min  Domenic Polite, MD [facility]  06/26/2019, 11:53 AM

## 2019-06-26 NOTE — Progress Notes (Signed)
CSW acknowledges heart failure home health screen consult. CSW lvm with patient's spouse Gwyndolyn Saxon, as patient noted to not be fully oriented. Pending call back at this time.   [place], [place]

## 2019-06-26 NOTE — Progress Notes (Signed)
Subjective:  Lethargic able to sleep flat   Objective:  Vitals:   06/25/19 2350 06/26/19 0043 06/26/19 0421 06/26/19 0809  BP: (!) 143/54 (!) 131/58 (!) 153/54 139/68  Pulse: 82 85 92 97  Resp: 16 18 20 18   Temp: 99.2 F (37.3 C) 98.9 F (37.2 C) 99.3 F (37.4 C) 99.1 F (37.3 C)  TempSrc: Oral Oral Oral Oral  SpO2: 90% 98% 99% 96%  Weight:   113.1 kg   Height:        Intake/Output from previous day:  Intake/Output Summary (Last 24 hours) at 06/26/2019 0844 Last data filed at 06/26/2019 0430 Gross per 24 hour  Intake 1076 ml  Output 500 ml  Net 576 ml    Physical Exam:  Affect Flat  Obese black female  HEENT: normal Neck supple with no adenopathy JVP normal no bruits no thyromegaly Lungs clear with no wheezing and good diaphragmatic motion Heart:  S1/S2 no murmur, no rub, gallop or click PMI normal Abdomen: mild RUQ pain on palpation  no bruit.  No HSM or HJR Distal pulses intact with no bruits Trace bilateral  edema Neuro non-focal Skin warm and dry No muscular weakness   Lab Results: Basic Metabolic Panel: Recent Labs    06/25/19 0959 06/26/19 0621  NA 143 140  K 4.1 5.0  CL 110 106  CO2 23 25  GLUCOSE 159* 159*  BUN 37* 38*  CREATININE 2.72* 3.01*  CALCIUM 8.3* 8.3*   CBC: Recent Labs    06/25/19 0914 06/25/19 0914 06/25/19 0959 06/26/19 0621  WBC 7.1  --  7.4  --   HGB 7.5*   < > 7.6* 9.0*  HCT 24.9*   < > 25.1* 30.0*  MCV 91.9  --  91.6  --   PLT 187  --  199  --    < > = values in this interval not displayed.    Anemia Panel: Recent Labs    06/26/19 0756  RETICCTPCT 1.9    Imaging: DG Chest Portable 1 View  Result Date: 06/25/2019 CLINICAL DATA:  Shortness of breath EXAM: PORTABLE CHEST 1 VIEW COMPARISON:  February 13, 2019 FINDINGS: Lungs are clear. Heart is mildly enlarged with pulmonary vascularity normal. No adenopathy. No bone lesions. IMPRESSION: Mild cardiac prominence.  Lungs clear.  No adenopathy evident.  Electronically Signed   By: Lowella Grip III M.D.   On: 06/25/2019 11:28   VAS [place] LOWER EXTREMITY VENOUS (DVT) (ONLY [facility] & [facility])  Result Date: 06/25/2019  Lower Venous DVTStudy Indications: Swelling.  Comparison Study: no prior Performing Technologist: June Leap RDMS, RVT  Examination Guidelines: A complete evaluation includes B-mode imaging, spectral Doppler, color Doppler, and power Doppler as needed of all accessible portions of each vessel. Bilateral testing is considered an integral part of a complete examination. Limited examinations for reoccurring indications may be performed as noted. The reflux portion of the exam is performed with the patient in reverse Trendelenburg.  +-----+---------------+---------+-----------+----------+--------------+ RIGHTCompressibilityPhasicitySpontaneityPropertiesThrombus Aging +-----+---------------+---------+-----------+----------+--------------+ CFV  Full           Yes      Yes                                 +-----+---------------+---------+-----------+----------+--------------+   +---------+---------------+---------+-----------+----------+--------------+ LEFT     CompressibilityPhasicitySpontaneityPropertiesThrombus Aging +---------+---------------+---------+-----------+----------+--------------+ CFV      Full           Yes  Yes                                 +---------+---------------+---------+-----------+----------+--------------+ SFJ      Full                                                        +---------+---------------+---------+-----------+----------+--------------+ FV Prox  Full                                                        +---------+---------------+---------+-----------+----------+--------------+ FV Mid   Full                                                        +---------+---------------+---------+-----------+----------+--------------+ FV DistalFull                                                         +---------+---------------+---------+-----------+----------+--------------+ PFV      Full                                                        +---------+---------------+---------+-----------+----------+--------------+ POP      Full           Yes      Yes                                 +---------+---------------+---------+-----------+----------+--------------+ PTV      Full                                                        +---------+---------------+---------+-----------+----------+--------------+ PERO     Full                                                        +---------+---------------+---------+-----------+----------+--------------+     Summary: RIGHT: - No evidence of common femoral vein obstruction.  LEFT: - There is no evidence of deep vein thrombosis in the lower extremity.  - No cystic structure found in the popliteal fossa.  *See table(s) above for measurements and observations. Electronically signed by Curt Jews MD on 06/25/2019 at 3:13:04 PM.    Final     Cardiac Studies:  ECG: NSR rate 80 normal    Telemetry:  NSR 06/26/2019   Echo: 02/13/19 EF 40-45% mild MR repeat pending today   Medications:   . sodium chloride   Intravenous Once  . furosemide  40 mg Intravenous BID  . heparin  5,000 Units Subcutaneous Q8H  . hydrALAZINE  100 mg Oral Q8H  . insulin aspart  0-9 Units Subcutaneous TID WC  . isosorbide mononitrate  60 mg Oral Daily  . metolazone  5 mg Oral Daily  . sodium chloride flush  3 mL Intravenous Q12H     . sodium chloride      Assessment/Plan:   1. Dyspnea/CHF:  Hard to tell volume status due to body habitus and obesity. BNP 819 continue iv lasix and zaroxyln. Acute on chronic anemia and CRF contribute to dyspnea and volume overload. No history of CAD with non ischemic myovue 07/30/18 and normal ECG Presumed non ischemic myovue due to poorly controlled HTN.  Ok to have surgery for GB once co morbid issues like  anemia and CRF stable.   2. HTN:  Improved lots of drug intolerances including norvasc, coreg, clonidine and ACE. Unable to use ARB/Entresto with CRF continue hydralazine, nitrates and diuretics   3. Anemia:   Hct 28.2 4 months ago and 25.1 yesterday check stools Transfuse per primary service should be on Procrit per renal   4. GB:  Chronic biliary colic for surgery when co morbid conditions more stable   Jenkins Rouge 06/26/2019, 8:44 AM

## 2019-06-26 NOTE — Progress Notes (Signed)
Writer called to room investigate complaint made by staff regarding safety concerns. Security came up and talked with husband reported to call if any further issues noted. Husband calm reported to Probation officer not going to do anything to worry staff or security.

## 2019-06-27 ENCOUNTER — Inpatient Hospital Stay (HOSPITAL_COMMUNITY): Admission: RE | Admit: 2019-06-27 | Payer: PPO | Source: Ambulatory Visit

## 2019-06-27 LAB — BASIC METABOLIC PANEL
Anion gap: 11 (ref 5–15)
BUN: 43 mg/dL — ABNORMAL HIGH (ref 8–23)
CO2: 25 mmol/L (ref 22–32)
Calcium: 8.6 mg/dL — ABNORMAL LOW (ref 8.9–10.3)
Chloride: 105 mmol/L (ref 98–111)
Creatinine, Ser: 3.16 mg/dL — ABNORMAL HIGH (ref 0.44–1.00)
GFR calc Af Amer: 17 mL/min — ABNORMAL LOW (ref >60)
GFR calc non Af Amer: 14 mL/min — ABNORMAL LOW (ref >60)
Glucose, Bld: 95 mg/dL (ref 70–99)
Potassium: 4.4 mmol/L (ref 3.5–5.1)
Sodium: 141 mmol/L (ref 135–145)

## 2019-06-27 NOTE — Progress Notes (Signed)
Patient requests BIPAP come off for bath etc, her cognition has improved she appears much more vibrant and alert at this time. Pt changed to [place] 5-6 L, sats maintaining low 80s-90 in supine position (temporary for hygiene).

## 2019-06-27 NOTE — Progress Notes (Signed)
PROGRESS NOTE    Zoe Clarke  OTL:[id number] DOB: 1951-01-22 DOA: 06/25/2019 PCP: Antony Contras, MD    Brief Narrative:  Zoe Clarke a 69 y.o.femalewith medical history significant ofCKD stage IIIb, hypertension, cholelithiasis, morbid obesity, tobacco abuse, marijuana abuse, chronic diastolic congestive heart failure, anemia of chronic disease presents to emergency department due to worsening shortness of breath.Her symptoms started about 2 days ago, exertional dyspnea with heaviness on the chest, mild intermittent cough nonproductive, no fever chills. Patient also admitted she has trouble control her blood pressure, she has been on 3 different BP meds with poorly controlled blood pressure and she is allergic to several other BP meds. She went to surgeon for preop labs for elective cholecystectomy next week, and was found very tachypneic and sent to the ED for evaluation. ED Course:Patient was found to have fluid overload, chest x-ray showed cardiomegaly,hemoglobin 7.6 compared to 8.9 4 months ago, [place] remained stable    Consultants:   None  Procedures: Neurology  Antimicrobials:       Subjective: Overnight refused to use BiPAP.  Was on oxygen she took it off.  Desatted low but nurse was not sure if it was in the 90s or 80s.  She was confused.  This a.m. went back on BiPAP and she is agreed to keep it on  Objective: Vitals:   06/27/19 0825 06/27/19 0926 06/27/19 0934 06/27/19 1144  BP: 140/68 (!) 154/62  (!) 196/68  Pulse: 83 70 72 85  Resp: 20   18  Temp: 98.3 F (36.8 C)   99.3 F (37.4 C)  TempSrc: Oral   Oral  SpO2: 99%  99% 100%  Weight:      Height:        Intake/Output Summary (Last 24 hours) at 06/27/2019 1344 Last data filed at 06/27/2019 1133 Gross per 24 hour  Intake 700 ml  Output 2700 ml  Net -2000 ml   Filed Weights   06/25/19 1824 06/26/19 0421 06/27/19 0055  Weight: 110.2 kg 113.1 kg 110.3 kg     Examination:  General exam: Appears calm and comfortable on BiPAP, husband at bedside Respiratory system: Clear to auscultation. Respiratory effort normal. Cardiovascular system: S1 & S2 heard, RRR. No JVD, murmurs, rubs, gallops or clicks. Gastrointestinal system: Abdomen is nondistended, soft and nontender. Normal bowel sounds heard. Central nervous system: Alert and oriented. No focal neurological deficits. Extremities: Mild bilateral lower extremity edema Skin: Warm dry Psychiatry:  Mood & affect appropriate in current setting.     Data Reviewed: I have personally reviewed following labs and imaging studies  CBC: Recent Labs  Lab 06/25/19 0914 06/25/19 0959 06/26/19 0621  WBC 7.1 7.4  --   HGB 7.5* 7.6* 9.0*  HCT 24.9* 25.1* 30.0*  MCV 91.9 91.6  --   PLT 187 199  --    Basic Metabolic Panel: Recent Labs  Lab 06/25/19 0914 06/25/19 0959 06/26/19 0621 06/27/19 0425  NA 143 143 140 141  K 4.2 4.1 5.0 4.4  CL 111 110 106 105  CO2 22 23 25 25   GLUCOSE 166* 159* 159* 95  BUN 37* 37* 38* 43*  CREATININE 2.71* 2.72* 3.01* 3.16*  CALCIUM 8.3* 8.3* 8.3* 8.6*   GFR: Estimated Creatinine Clearance: 22.5 mL/min (A) (by C-G formula based on SCr of 3.16 mg/dL (H)). Liver Function Tests: No results for input(s): AST, ALT, ALKPHOS, BILITOT, PROT, ALBUMIN in the last 168 hours. No results for input(s): LIPASE, AMYLASE in the last 168 hours.  No results for input(s): AMMONIA in the last 168 hours. Coagulation Profile: No results for input(s): INR, PROTIME in the last 168 hours. Cardiac Enzymes: No results for input(s): CKTOTAL, CKMB, CKMBINDEX, TROPONINI in the last 168 hours. BNP (last 3 results) No results for input(s): PROBNP in the last 8760 hours. HbA1C: Recent Labs    06/25/19 0914  HGBA1C 5.4   CBG: Recent Labs  Lab 06/25/19 0828 06/25/19 1840 06/25/19 2115 06/26/19 0626 06/26/19 1211  GLUCAP 153* 77 102* 158* 84   Lipid Profile: No results for  input(s): CHOL, HDL, LDLCALC, TRIG, CHOLHDL, LDLDIRECT in the last 72 hours. Thyroid Function Tests: No results for input(s): TSH, T4TOTAL, FREET4, T3FREE, THYROIDAB in the last 72 hours. Anemia Panel: Recent Labs    06/26/19 0756  VITAMINB12 212  FOLATE 9.5  FERRITIN 64  TIBC 312  IRON 71  RETICCTPCT 1.9   Sepsis Labs: No results for input(s): PROCALCITON, LATICACIDVEN in the last 168 hours.  Recent Results (from the past 240 hour(s))  Respiratory Panel by RT PCR (Flu A&B, Covid) - Nasopharyngeal Swab     Status: None   Collection Time: 06/25/19 11:51 AM   Specimen: Nasopharyngeal Swab  Result Value Ref Range Status   SARS Coronavirus 2 by RT PCR NEGATIVE NEGATIVE Final    Comment: (NOTE) SARS-CoV-2 target nucleic acids are NOT DETECTED. The SARS-CoV-2 RNA is generally detectable in upper respiratoy specimens during the acute phase of infection. The lowest concentration of SARS-CoV-2 viral copies this assay can detect is 131 copies/mL. A negative result does not preclude SARS-Cov-2 infection and should not be used as the sole basis for treatment or other patient management decisions. A negative result may occur with  improper specimen collection/handling, submission of specimen other than nasopharyngeal swab, presence of viral mutation(s) within the areas targeted by this assay, and inadequate number of viral copies (<131 copies/mL). A negative result must be combined with clinical observations, patient history, and epidemiological information. The expected result is Negative. Fact Sheet for Patients:  PinkCheek.be Fact Sheet for Healthcare Providers:  GravelBags.it This test is not yet ap proved or cleared by the [place] FDA and  has been authorized for detection and/or diagnosis of SARS-CoV-2 by FDA under an Emergency Use Authorization (EUA). This EUA will remain  in effect (meaning this test can be used)  for the duration of the COVID-19 declaration under Section 564(b)(1) of the Act, 21 U.S.C. section 360bbb-3(b)(1), unless the authorization is terminated or revoked sooner.    Influenza A by PCR NEGATIVE NEGATIVE Final   Influenza B by PCR NEGATIVE NEGATIVE Final    Comment: (NOTE) The Xpert Xpress SARS-CoV-2/FLU/RSV assay is intended as an aid in  the diagnosis of influenza from Nasopharyngeal swab specimens and  should not be used as a sole basis for treatment. Nasal washings and  aspirates are unacceptable for Xpert Xpress SARS-CoV-2/FLU/RSV  testing. Fact Sheet for Patients: PinkCheek.be Fact Sheet for Healthcare Providers: GravelBags.it This test is not yet approved or cleared by the [place] FDA and  has been authorized for detection and/or diagnosis of SARS-CoV-2 by  FDA under an Emergency Use Authorization (EUA). This EUA will remain  in effect (meaning this test can be used) for the duration of the  Covid-19 declaration under Section 564(b)(1) of the Act, 21  U.S.C. section 360bbb-3(b)(1), unless the authorization is  terminated or revoked. Performed at [facility], [place] [address] [place], [place] [address]  Radiology Studies: DG CHEST PORT 1 VIEW  Result Date: 06/26/2019 CLINICAL DATA:  Congestive heart failure. EXAM: PORTABLE CHEST 1 VIEW COMPARISON:  06/25/2019 FINDINGS: The heart is enlarged but stable. Stable central vascular congestion without overt pulmonary edema. No definite pleural effusions. IMPRESSION: Cardiac enlargement and central vascular congestion but no edema or effusions. Electronically Signed   By: Marijo Sanes M.D.   On: 06/26/2019 08:59   ECHOCARDIOGRAM COMPLETE  Result Date: 06/26/2019    ECHOCARDIOGRAM REPORT   Patient Name:   KATHERINE TOUT Date of Exam: 06/26/2019 Medical Rec #:  [id number]           Height:       68.0 in Accession #:    [id number]           Weight:       249.3 lb Date of Birth:  11-07-1950           BSA:          2.245 m Patient Age:    82 years            BP:           153/54 mmHg Patient Gender: F                   HR:           98 bpm. Exam Location:  Inpatient Procedure: 2D Echo, Color Doppler and Cardiac Doppler Indications:    Z02.58 Acute diastolic (congestive) heart failure  History:        Patient has prior history of Echocardiogram examinations, most                 recent 02/13/2019. CHF; Risk Factors:Hypertension, Diabetes and                 Sleep Apnea.  Sonographer:    Raquel Sarna Senior RDCS Referring Phys: [id number] [place]  1. Left ventricular ejection fraction, by estimation, is 60 to 65%. The left ventricle has normal function. The left ventricle has no regional wall motion abnormalities. Left ventricular diastolic parameters are consistent with Grade I diastolic dysfunction (impaired relaxation). There is evidence of elevated mean left atrial pressure.  2. Right ventricular systolic function is normal. The right ventricular size is normal. There is mildly elevated pulmonary artery systolic pressure.  3. The mitral valve is normal in structure. No evidence of mitral valve regurgitation. No evidence of mitral stenosis.  4. The aortic valve is normal in structure. Aortic valve regurgitation is mild to moderate. No aortic stenosis is present.  5. The inferior vena cava is normal in size with greater than 50% respiratory variability, suggesting right atrial pressure of 3 mmHg.  6. Left atrial size was mildly dilated.  7. Right atrial size was mildly dilated. Comparison(s): Prior images reviewed side by side. Changes from prior study are noted. The left ventricular function has improved. Mitral insufficiency is no longer seen and PA systolic presure is lower. There is still evidence of diastolic dysfunction with elevated mean left atrial pressure, although improved from the previous study. FINDINGS  Left Ventricle: Left  ventricular ejection fraction, by estimation, is 60 to 65%. The left ventricle has normal function. The left ventricle has no regional wall motion abnormalities. The left ventricular internal cavity size was normal in size. There is  no left ventricular hypertrophy. Left ventricular diastolic parameters are consistent with Grade I diastolic dysfunction (impaired relaxation). Increased E/e' ratio is consistent with high  mean left atrial pressure. Right Ventricle: The right ventricular size is normal. No increase in right ventricular wall thickness. Right ventricular systolic function is normal. There is mildly elevated pulmonary artery systolic pressure. The tricuspid regurgitant velocity is 2.48  m/s, and with an assumed right atrial pressure of 15 mmHg, the estimated right ventricular systolic pressure is 19.5 mmHg. Left Atrium: Left atrial size was mildly dilated. Right Atrium: Right atrial size was mildly dilated. Pericardium: There is no evidence of pericardial effusion. Mitral Valve: The mitral valve is normal in structure. Normal mobility of the mitral valve leaflets. No evidence of mitral valve regurgitation. No evidence of mitral valve stenosis. Tricuspid Valve: The tricuspid valve is normal in structure. Tricuspid valve regurgitation is mild . No evidence of tricuspid stenosis. Aortic Valve: The aortic valve is normal in structure. Aortic valve regurgitation is mild to moderate. No aortic stenosis is present. Pulmonic Valve: The pulmonic valve was grossly normal. Pulmonic valve regurgitation is not visualized. No evidence of pulmonic stenosis. Aorta: The aortic root is normal in size and structure. Venous: The inferior vena cava is normal in size with greater than 50% respiratory variability, suggesting right atrial pressure of 3 mmHg. IAS/Shunts: No atrial level shunt detected by color flow Doppler.  LEFT VENTRICLE PLAX 2D LVIDd:         3.80 cm  Diastology LVIDs:         3.00 cm  LV e' lateral:   7.07  cm/s LV PW:         1.40 cm  LV E/e' lateral: 15.1 LV IVS:        2.10 cm  LV e' medial:    4.68 cm/s LVOT diam:     2.20 cm  LV E/e' medial:  22.9 LV SV:         98 LV SV Index:   44 LVOT Area:     3.80 cm  RIGHT VENTRICLE RV S prime:     11.00 cm/s TAPSE (M-mode): 1.8 cm LEFT ATRIUM             Index       RIGHT ATRIUM           Index LA diam:        4.10 cm 1.83 cm/m  RA Area:     20.50 cm LA Vol (A2C):   65.0 ml 28.96 ml/m RA Volume:   59.80 ml  26.64 ml/m LA Vol (A4C):   60.0 ml 26.73 ml/m LA Biplane Vol: 63.5 ml 28.29 ml/m  AORTIC VALVE LVOT Vmax:   135.00 cm/s LVOT Vmean:  93.800 cm/s LVOT VTI:    0.259 m  AORTA Ao Root diam: 3.50 cm Ao Asc diam:  3.20 cm MITRAL VALVE                TRICUSPID VALVE MV Area (PHT): 3.39 cm     TR Peak grad:   24.6 mmHg MV Decel Time: 224 msec     TR Vmax:        248.00 cm/s MV E velocity: 107.00 cm/s MV A velocity: 126.00 cm/s  SHUNTS MV E/A ratio:  0.85         Systemic VTI:  0.26 m                             Systemic Diam: 2.20 cm Dani Gobble Croitoru MD Electronically signed by Sanda Klein MD Signature Date/Time: 06/26/2019/11:22:50 AM  Final         Scheduled Meds: . sodium chloride   Intravenous Once  . furosemide  80 mg Intravenous BID  . heparin  5,000 Units Subcutaneous Q8H  . hydrALAZINE  100 mg Oral Q8H  . insulin aspart  0-9 Units Subcutaneous TID WC  . isosorbide mononitrate  60 mg Oral Daily  . metolazone  5 mg Oral Daily  . sodium chloride flush  3 mL Intravenous Q12H   Continuous Infusions: . sodium chloride      Assessment & Plan:   Active Problems:   CHF (congestive heart failure) (HCC)   Metabolic encephalopathy Acute Hypercarbic resp failurre -ABG was ordered previously which noted respiratory acidosis, likely secondary to CHF, plus underlying COPD, OSA  -BIPAP , spoke to pt about being more compliant with BIPAP -IV lasix  Acute on chronic diastolic CHF decompensation -Numerous allergies to meds namely Coreg  lisinopril, clonidine and amlodipine  -Started on Lasix 40 mg twice daily this admission, poor response to diuretics so far, Lasix dose increased previously to 80 mg twice daily  Continue with Zaroxolyn We will continue monitor renal function   I/O, daily weights   Acute on chronic anemia -Hemoglobin 7.5 on admission, baseline closer to 8 -No overt bleeding history, check anemia panel, transfuse 1 unit of PRBC on admission -doubt she is on Procrit, followed by [facility] for CKD    Stage IV chronic kidney disease -Baseline creatinine in the 2.5-3 range, -Stable at baseline, monitor with diuresis  DM stage 2 -CBGs are stable, p.o. intake is poor, hold long-acting insulin, sliding scale for now  Cholelithiasis -FU with Gen Surgery   DVT prophylaxis:Heparin subcu Code Status:Full code Family Communication:husband at bedside Disposition Plan:Likely will need 2 to 3 days hospital stay until her symptoms improved,also add PT evaluation Barrier: still needs iv lasix as she is not at baseline.       LOS: 2 days   Time spent: 45 minutes with more than 50% COC    Nolberto Hanlon, MD [facility] Pager [phone]  If 7PM-7AM, please contact night-coverage [email or web address] Password [email or web address] 06/27/2019, 1:44 PM

## 2019-06-27 NOTE — Progress Notes (Signed)
   06/27/19 0934  BiPAP/CPAP/SIPAP  BiPAP/CPAP/SIPAP Pt Type Adult  Mask Type Full face mask  Mask Size Medium  Set Rate 14 breaths/min  Respiratory Rate 20 breaths/min  IPAP 16 cmH20  EPAP 8 cmH2O  Oxygen Percent 35 %  Minute Ventilation 7.4  Leak 0  Tidal Volume (Vt) 399  BiPAP/CPAP/SIPAP BiPAP  Patient Home Equipment No  Auto Titrate No  Press High Alarm 25 cmH2O  Press Low Alarm 5 cmH2O  BiPAP/CPAP /SiPAP Vitals  Pulse Rate 72  SpO2 99 %  Patient placed on Bipap on above setting after being found without her O2 on with low SPO2. Patient tolerating well at this time.

## 2019-06-27 NOTE — Progress Notes (Signed)
Patient awake alert and oriented during shift report, denies complaints.

## 2019-06-27 NOTE — Progress Notes (Signed)
Patient is doing well on nasal canula 3L O2.  Titrated O2 to 2L.

## 2019-06-27 NOTE — Progress Notes (Signed)
Patient discovered without nasal canula in place, disoriented. Patient's saturations were in the 80s. Increased O2 to 4L via nasal canula, while placing call to respiratory. Given patient refused BIPAP last night, it was reinforced how critical the machine can be to her breathing and what decline could occur if she refuses to use it. Pt's husband at bedside, encouraging patient to use BIPAP.   RT arrives at bedside, to ensure BIPAP settings are appropriate/adequate, confirmed. Plan to leave BIPAP in place for one hour then re-assess. PO meds held for the time being.   MD arrives once RT leaves room, MD updated on patient status face-to-face agreeable with plan. AM Lasix administered at this time as well.

## 2019-06-28 ENCOUNTER — Encounter (HOSPITAL_COMMUNITY): Payer: Self-pay | Admitting: Internal Medicine

## 2019-06-28 DIAGNOSIS — I5033 Acute on chronic diastolic (congestive) heart failure: Secondary | ICD-10-CM

## 2019-06-28 DIAGNOSIS — K8080 Other cholelithiasis without obstruction: Secondary | ICD-10-CM

## 2019-06-28 LAB — BASIC METABOLIC PANEL
Anion gap: 12 (ref 5–15)
BUN: 46 mg/dL — ABNORMAL HIGH (ref 8–23)
CO2: 26 mmol/L (ref 22–32)
Calcium: 8.5 mg/dL — ABNORMAL LOW (ref 8.9–10.3)
Chloride: 103 mmol/L (ref 98–111)
Creatinine, Ser: 3.12 mg/dL — ABNORMAL HIGH (ref 0.44–1.00)
GFR calc Af Amer: 17 mL/min — ABNORMAL LOW (ref >60)
GFR calc non Af Amer: 15 mL/min — ABNORMAL LOW (ref >60)
Glucose, Bld: 133 mg/dL — ABNORMAL HIGH (ref 70–99)
Potassium: 3.9 mmol/L (ref 3.5–5.1)
Sodium: 141 mmol/L (ref 135–145)

## 2019-06-28 LAB — BRAIN NATRIURETIC PEPTIDE: B Natriuretic Peptide: 382.5 pg/mL — ABNORMAL HIGH (ref 0.0–100.0)

## 2019-06-28 MED ORDER — HYDROCORTISONE 1 % EX CREA
TOPICAL_CREAM | CUTANEOUS | Status: DC | PRN
  Administered 2019-06-29: 1 via TOPICAL
  Filled 2019-06-28: qty 28

## 2019-06-28 MED ORDER — DOXAZOSIN MESYLATE 1 MG PO TABS
1.0000 mg | ORAL_TABLET | Freq: Every day | ORAL | Status: DC
  Administered 2019-06-28 – 2019-06-30 (×3): 1 mg via ORAL
  Filled 2019-06-28 (×3): qty 1

## 2019-06-28 MED ORDER — FUROSEMIDE 80 MG PO TABS
80.0000 mg | ORAL_TABLET | Freq: Two times a day (BID) | ORAL | Status: DC
  Administered 2019-06-28 – 2019-06-30 (×5): 80 mg via ORAL
  Filled 2019-06-28 (×5): qty 1

## 2019-06-28 NOTE — Progress Notes (Signed)
Patient refused PPV tonight. 

## 2019-06-28 NOTE — Progress Notes (Signed)
PROGRESS NOTE    Zoe Clarke  ZOX:[id number] DOB: 11-07-1950 DOA: 06/25/2019 PCP: Antony Contras, MD    Brief Narrative:  Zoe Clarke a 69 y.o.femalewith medical history significant ofCKD stage IIIb, hypertension, cholelithiasis, morbid obesity, tobacco abuse, marijuana abuse, chronic diastolic congestive heart failure, anemia of chronic disease presents to emergency department due to worsening shortness of breath.Her symptoms started about 2 days ago, exertional dyspnea with heaviness on the chest, mild intermittent cough nonproductive, no fever chills. Patient also admitted she has trouble control her blood pressure, she has been on 3 different BP meds with poorly controlled blood pressure and she is allergic to several other BP meds. She went to surgeon for preop labs for elective cholecystectomy next week, and was found very tachypneic and sent to the ED for evaluation. ED Course:Patient was found to have fluid overload, chest x-ray showed cardiomegaly,hemoglobin 7.6 compared to 8.9 4 months ago, Crelevel remained stable    Consultants:   None  Procedures: Neurology  Antimicrobials:       Subjective: Overnight refused to use BiPAP again. This am c/o some sob. No cp. No other issues   Objective: Vitals:   06/28/19 0300 06/28/19 0426 06/28/19 0648 06/28/19 0807  BP:  (!) 170/76 (!) 154/63 (!) 180/69  Pulse:  83 72 80  Resp:  16 20 20   Temp:  99 F (37.2 C)  98.7 F (37.1 C)  TempSrc:  Oral  Oral  SpO2:  100% 100% 100%  Weight: 106.5 kg     Height:        Intake/Output Summary (Last 24 hours) at 06/28/2019 0809 Last data filed at 06/28/2019 0657 Gross per 24 hour  Intake 1060 ml  Output 2700 ml  Net -1640 ml   Filed Weights   06/26/19 0421 06/27/19 0055 06/28/19 0300  Weight: 113.1 kg 110.3 kg 106.5 kg    Examination:  General exam: Appears calm and comfortable, sitting in bed. nad Respiratory system: Clear to auscultation.  Respiratory effort normal.no wheezing or crackles Cardiovascular system: S1 & S2 heard, RRR. No JVD, murmurs, rubs, gallops or clicks. Gastrointestinal system: Abdomen is nondistended, soft and nontender. Normal bowel sounds heard. Central nervous system: Alert and oriented x3. Grossly intact Extremities: Mild bilateral lower extremity edema Skin: Warm dry Psychiatry:  Mood & affect appropriate in current setting.     Data Reviewed: I have personally reviewed following labs and imaging studies  CBC: Recent Labs  Lab 06/25/19 0914 06/25/19 0959 06/26/19 0621  WBC 7.1 7.4  --   HGB 7.5* 7.6* 9.0*  HCT 24.9* 25.1* 30.0*  MCV 91.9 91.6  --   PLT 187 199  --    Basic Metabolic Panel: Recent Labs  Lab 06/25/19 0914 06/25/19 0959 06/26/19 0621 06/27/19 0425 06/28/19 0413  NA 143 143 140 141 141  K 4.2 4.1 5.0 4.4 3.9  CL 111 110 106 105 103  CO2 22 23 25 25 26   GLUCOSE 166* 159* 159* 95 133*  BUN 37* 37* 38* 43* 46*  CREATININE 2.71* 2.72* 3.01* 3.16* 3.12*  CALCIUM 8.3* 8.3* 8.3* 8.6* 8.5*   GFR: Estimated Creatinine Clearance: 22.3 mL/min (A) (by C-G formula based on SCr of 3.12 mg/dL (H)). Liver Function Tests: No results for input(s): AST, ALT, ALKPHOS, BILITOT, PROT, ALBUMIN in the last 168 hours. No results for input(s): LIPASE, AMYLASE in the last 168 hours. No results for input(s): AMMONIA in the last 168 hours. Coagulation Profile: No results for input(s): INR, PROTIME  in the last 168 hours. Cardiac Enzymes: No results for input(s): CKTOTAL, CKMB, CKMBINDEX, TROPONINI in the last 168 hours. BNP (last 3 results) No results for input(s): PROBNP in the last 8760 hours. HbA1C: Recent Labs    06/25/19 0914  HGBA1C 5.4   CBG: Recent Labs  Lab 06/25/19 0828 06/25/19 1840 06/25/19 2115 06/26/19 0626 06/26/19 1211  GLUCAP 153* 77 102* 158* 84   Lipid Profile: No results for input(s): CHOL, HDL, LDLCALC, TRIG, CHOLHDL, LDLDIRECT in the last 72  hours. Thyroid Function Tests: No results for input(s): TSH, T4TOTAL, FREET4, T3FREE, THYROIDAB in the last 72 hours. Anemia Panel: Recent Labs    06/26/19 0756  VITAMINB12 212  FOLATE 9.5  FERRITIN 64  TIBC 312  IRON 71  RETICCTPCT 1.9   Sepsis Labs: No results for input(s): PROCALCITON, LATICACIDVEN in the last 168 hours.  Recent Results (from the past 240 hour(s))  Respiratory Panel by RT PCR (Flu A&B, Covid) - Nasopharyngeal Swab     Status: None   Collection Time: 06/25/19 11:51 AM   Specimen: Nasopharyngeal Swab  Result Value Ref Range Status   SARS Coronavirus 2 by RT PCR NEGATIVE NEGATIVE Final    Comment: (NOTE) SARS-CoV-2 target nucleic acids are NOT DETECTED. The SARS-CoV-2 RNA is generally detectable in upper respiratoy specimens during the acute phase of infection. The lowest concentration of SARS-CoV-2 viral copies this assay can detect is 131 copies/mL. A negative result does not preclude SARS-Cov-2 infection and should not be used as the sole basis for treatment or other patient management decisions. A negative result may occur with  improper specimen collection/handling, submission of specimen other than nasopharyngeal swab, presence of viral mutation(s) within the areas targeted by this assay, and inadequate number of viral copies (<131 copies/mL). A negative result must be combined with clinical observations, patient history, and epidemiological information. The expected result is Negative. Fact Sheet for Patients:  PinkCheek.be Fact Sheet for Healthcare Providers:  GravelBags.it This test is not yet ap proved or cleared by the [place] FDA and  has been authorized for detection and/or diagnosis of SARS-CoV-2 by FDA under an Emergency Use Authorization (EUA). This EUA will remain  in effect (meaning this test can be used) for the duration of the COVID-19 declaration under Section 564(b)(1) of  the Act, 21 U.S.C. section 360bbb-3(b)(1), unless the authorization is terminated or revoked sooner.    Influenza A by PCR NEGATIVE NEGATIVE Final   Influenza B by PCR NEGATIVE NEGATIVE Final    Comment: (NOTE) The Xpert Xpress SARS-CoV-2/FLU/RSV assay is intended as an aid in  the diagnosis of influenza from Nasopharyngeal swab specimens and  should not be used as a sole basis for treatment. Nasal washings and  aspirates are unacceptable for Xpert Xpress SARS-CoV-2/FLU/RSV  testing. Fact Sheet for Patients: PinkCheek.be Fact Sheet for Healthcare Providers: GravelBags.it This test is not yet approved or cleared by the [place] FDA and  has been authorized for detection and/or diagnosis of SARS-CoV-2 by  FDA under an Emergency Use Authorization (EUA). This EUA will remain  in effect (meaning this test can be used) for the duration of the  Covid-19 declaration under Section 564(b)(1) of the Act, 21  U.S.C. section 360bbb-3(b)(1), unless the authorization is  terminated or revoked. Performed at [facility], [place] [address] [place], [place] [address]          Radiology Studies: ECHOCARDIOGRAM COMPLETE  Result Date: 06/26/2019    ECHOCARDIOGRAM REPORT   Patient  Name:   MARYHELEN LINDLER Date of Exam: 06/26/2019 Medical Rec #:  [id number]           Height:       68.0 in Accession #:    [id number]          Weight:       249.3 lb Date of Birth:  [birth year]           BSA:          2.245 m Patient Age:    68 years            BP:           153/54 mmHg Patient Gender: F                   HR:           98 bpm. Exam Location:  Inpatient Procedure: 2D Echo, Color Doppler and Cardiac Doppler Indications:    R44.31 Acute diastolic (congestive) heart failure  History:        Patient has prior history of Echocardiogram examinations, most                 recent 02/13/2019. CHF; Risk Factors:Hypertension, Diabetes and                  Sleep Apnea.  Sonographer:    Raquel Sarna Senior RDCS Referring Phys: [id number] [place]  1. Left ventricular ejection fraction, by estimation, is 60 to 65%. The left ventricle has normal function. The left ventricle has no regional wall motion abnormalities. Left ventricular diastolic parameters are consistent with Grade I diastolic dysfunction (impaired relaxation). There is evidence of elevated mean left atrial pressure.  2. Right ventricular systolic function is normal. The right ventricular size is normal. There is mildly elevated pulmonary artery systolic pressure.  3. The mitral valve is normal in structure. No evidence of mitral valve regurgitation. No evidence of mitral stenosis.  4. The aortic valve is normal in structure. Aortic valve regurgitation is mild to moderate. No aortic stenosis is present.  5. The inferior vena cava is normal in size with greater than 50% respiratory variability, suggesting right atrial pressure of 3 mmHg.  6. Left atrial size was mildly dilated.  7. Right atrial size was mildly dilated. Comparison(s): Prior images reviewed side by side. Changes from prior study are noted. The left ventricular function has improved. Mitral insufficiency is no longer seen and PA systolic presure is lower. There is still evidence of diastolic dysfunction with elevated mean left atrial pressure, although improved from the previous study. FINDINGS  Left Ventricle: Left ventricular ejection fraction, by estimation, is 60 to 65%. The left ventricle has normal function. The left ventricle has no regional wall motion abnormalities. The left ventricular internal cavity size was normal in size. There is  no left ventricular hypertrophy. Left ventricular diastolic parameters are consistent with Grade I diastolic dysfunction (impaired relaxation). Increased E/e' ratio is consistent with high mean left atrial pressure. Right Ventricle: The right ventricular size is normal. No increase in right  ventricular wall thickness. Right ventricular systolic function is normal. There is mildly elevated pulmonary artery systolic pressure. The tricuspid regurgitant velocity is 2.48  m/s, and with an assumed right atrial pressure of 15 mmHg, the estimated right ventricular systolic pressure is 61.9 mmHg. Left Atrium: Left atrial size was mildly dilated. Right Atrium: Right atrial size was mildly dilated. Pericardium: There is no evidence of pericardial effusion. Mitral Valve:  The mitral valve is normal in structure. Normal mobility of the mitral valve leaflets. No evidence of mitral valve regurgitation. No evidence of mitral valve stenosis. Tricuspid Valve: The tricuspid valve is normal in structure. Tricuspid valve regurgitation is mild . No evidence of tricuspid stenosis. Aortic Valve: The aortic valve is normal in structure. Aortic valve regurgitation is mild to moderate. No aortic stenosis is present. Pulmonic Valve: The pulmonic valve was grossly normal. Pulmonic valve regurgitation is not visualized. No evidence of pulmonic stenosis. Aorta: The aortic root is normal in size and structure. Venous: The inferior vena cava is normal in size with greater than 50% respiratory variability, suggesting right atrial pressure of 3 mmHg. IAS/Shunts: No atrial level shunt detected by color flow Doppler.  LEFT VENTRICLE PLAX 2D LVIDd:         3.80 cm  Diastology LVIDs:         3.00 cm  LV e' lateral:   7.07 cm/s LV PW:         1.40 cm  LV E/e' lateral: 15.1 LV IVS:        2.10 cm  LV e' medial:    4.68 cm/s LVOT diam:     2.20 cm  LV E/e' medial:  22.9 LV SV:         98 LV SV Index:   44 LVOT Area:     3.80 cm  RIGHT VENTRICLE RV S prime:     11.00 cm/s TAPSE (M-mode): 1.8 cm LEFT ATRIUM             Index       RIGHT ATRIUM           Index LA diam:        4.10 cm 1.83 cm/m  RA Area:     20.50 cm LA Vol (A2C):   65.0 ml 28.96 ml/m RA Volume:   59.80 ml  26.64 ml/m LA Vol (A4C):   60.0 ml 26.73 ml/m LA Biplane Vol: 63.5 ml  28.29 ml/m  AORTIC VALVE LVOT Vmax:   135.00 cm/s LVOT Vmean:  93.800 cm/s LVOT VTI:    0.259 m  AORTA Ao Root diam: 3.50 cm Ao Asc diam:  3.20 cm MITRAL VALVE                TRICUSPID VALVE MV Area (PHT): 3.39 cm     TR Peak grad:   24.6 mmHg MV Decel Time: 224 msec     TR Vmax:        248.00 cm/s MV E velocity: 107.00 cm/s MV A velocity: 126.00 cm/s  SHUNTS MV E/A ratio:  0.85         Systemic VTI:  0.26 m                             Systemic Diam: 2.20 cm Dani Gobble Croitoru MD Electronically signed by Sanda Klein MD Signature Date/Time: 06/26/2019/11:22:50 AM    Final         Scheduled Meds: . sodium chloride   Intravenous Once  . furosemide  80 mg Intravenous BID  . heparin  5,000 Units Subcutaneous Q8H  . hydrALAZINE  100 mg Oral Q8H  . insulin aspart  0-9 Units Subcutaneous TID WC  . isosorbide mononitrate  60 mg Oral Daily  . metolazone  5 mg Oral Daily  . sodium chloride flush  3 mL Intravenous Q12H   Continuous Infusions: . sodium  chloride      Assessment & Plan:   Active Problems:   Essential hypertension   Acute on chronic diastolic heart failure (HCC)   CHF (congestive heart failure) (HCC)   Metabolic encephalopathy Acute Hypercarbic resp failurre -ABG was ordered previously which noted respiratory acidosis, likely secondary to CHF, plus underlying COPD, OSA  Not being very compliant using the bipap here. Spoke to her again about this and she is agreeable to be more complaint.  Clinically improved today.  Will transition from iv lasix to po todaY.   Acute on chronic diastolic CHF decompensation -Numerous allergies to meds namely Coreg lisinopril, clonidine and amlodipine  -Started on Lasix 40 mg twice daily this admission, poor response to diuretics so far, Lasix dose increased previously to 80 mg twice daily , transition to po today Will d/c Zaroxyln We will continue monitor renal function   I/O, daily weights bp control   Acute on chronic  anemia -Hemoglobin 7.5 on admission, baseline closer to 8 -No overt bleeding history, check anemia panel, transfuse 1 unit of PRBC on admission -doubt she is on Procrit, followed by [facility] for CKD    Stage IV chronic kidney disease -Baseline creatinine in the 2.5-3 range, -Stable at baseline, monitor with diuresis -if worsens can consider nephrology consult in am  DM stage 2 -CBGs are stable, p.o. intake is poor, hold long-acting insulin, sliding scale for now  Cholelithiasis -FU with Gen Surgery   DVT prophylaxis:Heparin subcu Code Status:Full code Family Communication:none at bedside Disposition Plan:Likely will need 2  days hospital stay until her symptoms improved,also add PT evaluation Barrier: Still not fully at baseline from respiratory status, need to make adjustments of meds. Monitor labs for worsening renal functional.      LOS: 3 days   Time spent: 45 minutes with more than 50% COC    Nolberto Hanlon, MD [facility] Pager [phone]  If 7PM-7AM, please contact night-coverage [email or web address] Password [facility] 06/28/2019, 8:09 AM Patient ID: Kelilah Hebard, female   DOB: 02-12-1951, 69 y.o.   MRN: [id number]

## 2019-06-29 DIAGNOSIS — I10 Essential (primary) hypertension: Secondary | ICD-10-CM

## 2019-06-29 DIAGNOSIS — K819 Cholecystitis, unspecified: Secondary | ICD-10-CM

## 2019-06-29 DIAGNOSIS — E785 Hyperlipidemia, unspecified: Secondary | ICD-10-CM

## 2019-06-29 LAB — GLUCOSE, CAPILLARY
Glucose-Capillary: 73 mg/dL (ref 70–99)
Glucose-Capillary: 120 mg/dL — ABNORMAL HIGH (ref 70–99)
Glucose-Capillary: 97 mg/dL (ref 70–99)
Glucose-Capillary: 99 mg/dL (ref 70–99)
Glucose-Capillary: 113 mg/dL — ABNORMAL HIGH (ref 70–99)
Glucose-Capillary: 123 mg/dL — ABNORMAL HIGH (ref 70–99)
Glucose-Capillary: 138 mg/dL — ABNORMAL HIGH (ref 70–99)
Glucose-Capillary: 127 mg/dL — ABNORMAL HIGH (ref 70–99)
Glucose-Capillary: 129 mg/dL — ABNORMAL HIGH (ref 70–99)
Glucose-Capillary: 121 mg/dL — ABNORMAL HIGH (ref 70–99)
Glucose-Capillary: 122 mg/dL — ABNORMAL HIGH (ref 70–99)
Glucose-Capillary: 153 mg/dL — ABNORMAL HIGH (ref 70–99)
Glucose-Capillary: 180 mg/dL — ABNORMAL HIGH (ref 70–99)

## 2019-06-29 LAB — BASIC METABOLIC PANEL
Anion gap: 11 (ref 5–15)
BUN: 43 mg/dL — ABNORMAL HIGH (ref 8–23)
CO2: 29 mmol/L (ref 22–32)
Calcium: 9 mg/dL (ref 8.9–10.3)
Chloride: 100 mmol/L (ref 98–111)
Creatinine, Ser: 2.96 mg/dL — ABNORMAL HIGH (ref 0.44–1.00)
GFR calc Af Amer: 18 mL/min — ABNORMAL LOW (ref >60)
GFR calc non Af Amer: 16 mL/min — ABNORMAL LOW (ref >60)
Glucose, Bld: 151 mg/dL — ABNORMAL HIGH (ref 70–99)
Potassium: 4.1 mmol/L (ref 3.5–5.1)
Sodium: 140 mmol/L (ref 135–145)

## 2019-06-29 LAB — CBC
HCT: 29.5 % — ABNORMAL LOW (ref 36.0–46.0)
Hemoglobin: 9.2 g/dL — ABNORMAL LOW (ref 12.0–15.0)
MCH: 27.4 pg (ref 26.0–34.0)
MCHC: 31.2 g/dL (ref 30.0–36.0)
MCV: 87.8 fL (ref 80.0–100.0)
Platelets: 193 10*3/uL (ref 150–400)
RBC: 3.36 MIL/uL — ABNORMAL LOW (ref 3.87–5.11)
RDW: 15.2 % (ref 11.5–15.5)
WBC: 8.5 10*3/uL (ref 4.0–10.5)
nRBC: 0 % (ref 0.0–0.2)

## 2019-06-29 MED ORDER — PROMETHAZINE HCL 25 MG/ML IJ SOLN
12.5000 mg | Freq: Four times a day (QID) | INTRAMUSCULAR | Status: DC | PRN
  Filled 2019-06-29: qty 1

## 2019-06-29 MED ORDER — GUAIFENESIN 100 MG/5ML PO SOLN
5.0000 mL | ORAL | Status: DC | PRN
  Administered 2019-06-29: 100 mg via ORAL
  Filled 2019-06-29: qty 5

## 2019-06-29 NOTE — Progress Notes (Addendum)
Progress Note  Patient Name: Zoe Clarke Date of Encounter: 06/29/2019  Primary Cardiologist: Skeet Latch, MD   Subjective   Sleeping in bed, bipap not on. States biggest issues are that she is tired, and her arms are sore from the blood pressure cuff squeezing. She feels that her breathing is improved. No chest pain.  Inpatient Medications    Scheduled Meds: . sodium chloride   Intravenous Once  . doxazosin  1 mg Oral Daily  . furosemide  80 mg Oral BID  . heparin  5,000 Units Subcutaneous Q8H  . hydrALAZINE  100 mg Oral Q8H  . insulin aspart  0-9 Units Subcutaneous TID WC  . isosorbide mononitrate  60 mg Oral Daily  . sodium chloride flush  3 mL Intravenous Q12H   Continuous Infusions: . sodium chloride     PRN Meds: sodium chloride, acetaminophen, hydrocortisone cream, labetalol, ondansetron (ZOFRAN) IV, promethazine, sodium chloride flush   Vital Signs    Vitals:   06/28/19 1915 06/29/19 0020 06/29/19 0428 06/29/19 0744  BP: 140/72 (!) 145/65 (!) 162/72 (!) 142/54  Pulse: 90 (!) 103 (!) 103 89  Resp: 17 20 18    Temp: 99.2 F (37.3 C) 98.7 F (37.1 C) 100 F (37.8 C) 98 F (36.7 C)  TempSrc: Oral Oral Oral   SpO2: 97% 100% 95% 98%  Weight:   103.6 kg   Height:        Intake/Output Summary (Last 24 hours) at 06/29/2019 1024 Last data filed at 06/29/2019 0900 Gross per 24 hour  Intake 1056 ml  Output 2851 ml  Net -1795 ml   Last 3 Weights 06/29/2019 06/28/2019 06/27/2019  Weight (lbs) 228 lb 6.3 oz 234 lb 12.6 oz 243 lb 2.7 oz  Weight (kg) 103.6 kg 106.5 kg 110.3 kg      Telemetry    Sinus rhythm, with one 4 beat run of NSVT - Personally Reviewed  ECG    No new since 4/8 - Personally Reviewed  Physical Exam   GEN: No acute distress.  Sleeping comfortably nearly flat in bed Neck: No JVD Cardiac: RRR, no murmurs, rubs, or gallops.  Respiratory: Clear to auscultation bilaterally. GI: Soft, nontender, non-distended  MS: Trivial  bilateral LE edema; No deformity. Neuro:  Nonfocal  Psych: Normal affect   Labs    High Sensitivity Troponin:   Recent Labs  Lab 06/25/19 1049 06/25/19 1418  TROPONINIHS 29* 28*      Chemistry Recent Labs  Lab 06/27/19 0425 06/28/19 0413 06/29/19 0532  NA 141 141 140  K 4.4 3.9 4.1  CL 105 103 100  CO2 25 26 29   GLUCOSE 95 133* 151*  BUN 43* 46* 43*  CREATININE 3.16* 3.12* 2.96*  CALCIUM 8.6* 8.5* 9.0  GFRNONAA 14* 15* 16*  GFRAA 17* 17* 18*  ANIONGAP 11 12 11      Hematology Recent Labs  Lab 06/25/19 0914 06/25/19 0914 06/25/19 0959 06/26/19 0621 06/26/19 0756 06/29/19 0532  WBC 7.1  --  7.4  --   --  8.5  RBC 2.71*   < > 2.74*  --  3.18* 3.36*  HGB 7.5*   < > 7.6* 9.0*  --  9.2*  HCT 24.9*   < > 25.1* 30.0*  --  29.5*  MCV 91.9  --  91.6  --   --  87.8  MCH 27.7  --  27.7  --   --  27.4  MCHC 30.1  --  30.3  --   --  31.2  RDW 16.2*  --  16.1*  --   --  15.2  PLT 187  --  199  --   --  193   < > = values in this interval not displayed.    BNP Recent Labs  Lab 06/25/19 1049 06/28/19 0413  BNP 819.9* 382.5*     DDimer No results for input(s): DDIMER in the last 168 hours.   Radiology    No results found.  Cardiac Studies   Echo 06/26/19 1. Left ventricular ejection fraction, by estimation, is 60 to 65%. The left ventricle has normal function. The left ventricle has no regional wall motion abnormalities. Left ventricular diastolic parameters are consistent with Grade I diastolic dysfunction (impaired relaxation). There is evidence of elevated mean left atrial pressure.  2. Right ventricular systolic function is normal. The right ventricular size is normal. There is mildly elevated pulmonary artery systolic pressure.  3. The mitral valve is normal in structure. No evidence of mitral valve regurgitation. No evidence of mitral stenosis.  4. The aortic valve is normal in structure. Aortic valve regurgitation is mild to moderate. No aortic stenosis  is present.  5. The inferior vena cava is normal in size with greater than 50% respiratory variability, suggesting right atrial pressure of 3 mmHg.  6. Left atrial size was mildly dilated.  7. Right atrial size was mildly dilated.   Comparison(s): Prior images reviewed side by side. Changes from prior study are noted. The left ventricular function has improved. Mitral insufficiency is no longer seen and PA systolic presure is lower. There is still evidence of diastolic dysfunction with elevated mean left atrial pressure, although improved from the previous study  Patient Profile     69 y.o. female with PMH chronic diastolic heart failure, hypertension, chronic kidney disease stage 4, anemia of chronic disease, cholecystitis who presented with worsening shortness of breath. Cardiology asked to evaluate prior to planned cholecystectomy.  Assessment & Plan    Acute on chronic diastolic heart failure: -complicated by anemia, chronic kidney disease -thought to be nonischemic based on prior nuclear stress test, suspect 2/2 uncontrolled hypertension -EF improved to 60-65% on most recent echo -see Dr. Kyla Balzarine prior notes; from a cardiovascular standpoint, she is acceptable risk for surgery once her anemia, renal function is stable. Currently scheduled for 4/14 -on oral diuretics, lasix 80 mg PO BID -admission weight 110.2 kg, current weight 103.6 kg -noted as net negative >4 L this admission -Cr improving, from 3.16 on admission to 2.96 today, suggests a component of congestion -BNP lower this admission compared to prior admissions  Hypertension: -cannot tolerate amlodipine, carvedilol, clonidine, ACEi -kidney disease limits ARB, ARNI, MRA -currently on hydralazine, nitrates, doxazosin, and furosemide -BP control improving. Will not make changes pending surgery, but may go up on doxazosin and/or nitrates post operatively.  Hyperlipidemia: -continue atorvastatin  Calculus cholecystitis,  with plans for surgery: -preoperative risk assessment as above  We will follow peripherally until after her surgery, then leave final recommendations prior to her discharge. Please call with questions in the interim  For questions or updates, please contact [place] Please consult [email or web address] for contact info under     Signed, Buford Dresser, MD  06/29/2019, 10:24 AM

## 2019-06-29 NOTE — Evaluation (Signed)
Physical Therapy Evaluation Patient Details Name: Zoe Clarke MRN: [id number] DOB: [birth year] Today's Date: 06/29/2019   History of Present Illness  69 y.o. female with medical history significant of CKD stage IIIb, hypertension, cholelithiasis, morbid obesity, tobacco abuse, marijuana abuse, chronic diastolic congestive heart failure, anemia of chronic disease presents to emergency department due to worsening shortness of breath and tachypnea noted during preop labs for elective cholecystectomy. Admitted 06/25/19 for treatment of acute on chronic diastolic CHF decompensation.   Clinical Impression  PTA pt reports living with husband in single story home with 3 steps to enter. Pt reports household distance ambulation with RW, pt able to perform bathing but requires assist from husband with lower body dressing. Pt's husband provides assist with iADLs. Pt is currently limited in safe mobility by nausea, oxygen desaturation with mobility (see General Comments), in presence of decreased strength, balance and endurance. PT recommending SNF level rehab at discharge due to decreased mobility, however pt will likely refuse. PT will continue to follow acutely.     Follow Up Recommendations SNF    Equipment Recommendations  None recommended by PT    Recommendations for Other Services OT consult     Precautions / Restrictions Precautions Precautions: Fall Restrictions Weight Bearing Restrictions: No      Mobility  Bed Mobility               General bed mobility comments: sitting EoB on entry   Transfers Overall transfer level: Needs assistance Equipment used: Rolling walker (2 wheeled) Transfers: Sit to/from Stand Sit to Stand: From elevated surface;Min assist         General transfer comment: min A for powerup and steadying in RW, good hand placement for powerup   Ambulation/Gait Ambulation/Gait assistance: Min assist Gait Distance (Feet): 5 Feet Assistive device:  Rolling walker (2 wheeled) Gait Pattern/deviations: Step-through pattern;Decreased step length - right;Decreased step length - left;Shuffle;Trunk flexed Gait velocity: slowed Gait velocity interpretation: <1.31 ft/sec, indicative of household ambulator General Gait Details: min A for steadying with RW, slow, mildly unsteady gait       Balance Overall balance assessment: Needs assistance Sitting-balance support: Feet supported;No upper extremity supported;Bilateral upper extremity supported;Single extremity supported Sitting balance-Leahy Scale: Poor Sitting balance - Comments: pt with difficulty sitting up EoB without support, only able to maintain for short bouts, uses R elbow to steady Postural control: Right lateral lean Standing balance support: Bilateral upper extremity supported;Single extremity supported;During functional activity Standing balance-Leahy Scale: Poor Standing balance comment: able to stand and perform self pericare with single UE support on RW                             Pertinent Vitals/Pain Pain Assessment: 0-10 Pain Score: 6  Pain Location: L LE  Pain Descriptors / Indicators: Aching;Throbbing;Sore Pain Intervention(s): Limited activity within patient's tolerance;Monitored during session;Repositioned    Home Living Family/patient expects to be discharged to:: Private residence Living Arrangements: Spouse/significant other Available Help at Discharge: Family;Available 24 hours/day Type of Home: House Home Access: Stairs to enter Entrance Stairs-Rails: Left Entrance Stairs-Number of Steps: 3 Home Layout: One level Home Equipment: Walker - 2 wheels;Grab bars - tub/shower;Shower seat      Prior Function Level of Independence: Needs assistance   Gait / Transfers Assistance Needed: walking using RW, working on getting a power chair   ADL's / Homemaking Assistance Needed: pt reports independent bathing, spouse assist with LB ADLs  and iADLs  Extremity/Trunk Assessment   Upper Extremity Assessment Upper Extremity Assessment: Generalized weakness;LUE deficits/detail LUE Deficits / Details: L UE tremor pt reports since 2017    Lower Extremity Assessment Lower Extremity Assessment: Generalized weakness    Cervical / Trunk Assessment Cervical / Trunk Assessment: Kyphotic  Communication   Communication: No difficulties  Cognition Arousal/Alertness: Awake/alert Behavior During Therapy: Flat affect Overall Cognitive Status: Within Functional Limits for tasks assessed                                        General Comments General comments (skin integrity, edema, etc.): Pt on 2L O2 via [place], pt reports no need of supplemental O2 at home. Pt SaO2 96%O2, removed supplemental O2 and pt able to maintain SaO2 > 90%O2 with ambulation to chair SaO2 dropped to 86%O2 but quickly rebounded to >90% with seated         Assessment/Plan    PT Assessment Patient needs continued PT services  PT Problem List Decreased strength;Decreased range of motion;Decreased activity tolerance;Decreased balance;Decreased mobility;Decreased cognition;Decreased coordination;Decreased knowledge of use of DME;Decreased safety awareness;Pain       PT Treatment Interventions DME instruction;Gait training;Stair training;Functional mobility training;Therapeutic activities;Therapeutic exercise;Balance training;Cognitive remediation;Patient/family education    PT Goals (Current goals can be found in the Care Plan section)  Acute Rehab PT Goals Patient Stated Goal: have less nausea PT Goal Formulation: With patient Time For Goal Achievement: 07/13/19 Potential to Achieve Goals: Fair    Frequency Min 2X/week   Barriers to discharge Inaccessible home environment         AM-PAC PT "6 Clicks" Mobility  Outcome Measure Help needed turning from your back to your side while in a flat bed without using bedrails?: A Little Help needed  moving from lying on your back to sitting on the side of a flat bed without using bedrails?: A Lot Help needed moving to and from a bed to a chair (including a wheelchair)?: A Little Help needed standing up from a chair using your arms (e.g., wheelchair or bedside chair)?: A Lot Help needed to walk in hospital room?: A Lot Help needed climbing 3-5 steps with a railing? : Total 6 Click Score: 13    End of Session Equipment Utilized During Treatment: Gait belt Activity Tolerance: Patient tolerated treatment well Patient left: in chair;with call bell/phone within reach;with chair alarm set Nurse Communication: Mobility status PT Visit Diagnosis: Unsteadiness on feet (R26.81);Other abnormalities of gait and mobility (R26.89);Muscle weakness (generalized) (M62.81);Difficulty in walking, not elsewhere classified (R26.2);Other symptoms and signs involving the nervous system (G88.110)    Time: 3159-4585 PT Time Calculation (min) (ACUTE ONLY): 32 min   Charges:   PT Evaluation $PT Eval Moderate Complexity: 1 Mod PT Treatments $Gait Training: 8-22 mins        Shavonte Zhao B. Migdalia Dk PT, DPT Acute Rehabilitation Services Pager [phone] Office [phone]   [place] 06/29/2019, 9:13 AM

## 2019-06-29 NOTE — Progress Notes (Signed)
Patient refused PPV.

## 2019-06-29 NOTE — TOC Initial Note (Signed)
Transition of Care [facility]) - Initial/Assessment Note    Patient Details  Name: Zoe Clarke MRN: [id number] Date of Birth: June 08, 1950  [facility] CM/SW Contact:    Alberteen Sam, LCSW Phone Number: 06/29/2019, 1:58 PM  Clinical Narrative:                  CSW met with patient at bedside to discuss SNF rec, she reports she is not going to SNF and would like home health services. She reports preference is [facility]. She states she has several 3in1 and walkers at home with no DME needs. Will need O2 set up at home if discharges on O2.   CSW reached out to Butch Penny with [facility] she reports they are unable to accept patient due ot insurance.   CSW reached out to [facility], they are able to accept patient for PT, OT, RN, aide and social worker home health services.   Expected Discharge Plan: [place] Barriers to Discharge: Continued Medical Work up   Patient Goals and CMS Choice Patient states their goals for this hospitalization and ongoing recovery are:: to go home CMS Medicare.gov Compare Post Acute Care list provided to:: Patient Choice offered to / list presented to : Patient  Expected Discharge Plan and Services Expected Discharge Plan: [place] Choice: [place] arrangements for the past 2 months: Single Family Home                           HH Arranged: PT, OT, RN, Social Work, Nurse's Aide [place] Agency: [facility] Date [place] Agency Contacted: 06/29/19 Time [place]: 73 Representative spoke with at [place]: [facility] Arrangements/Services Living arrangements for the past 2 months: [place] with:: Spouse Patient language and need for interpreter reviewed:: Yes Do you feel safe going back to the place where you live?: Yes      Need for Family Participation in Patient Care: No (Comment) Care giver support system in  place?: Yes (comment)   Criminal Activity/Legal Involvement Pertinent to Current Situation/Hospitalization: Yes - Comment as needed  Activities of Daily Living Home Assistive Devices/Equipment: None ADL Screening (condition at time of admission) Patient's cognitive ability adequate to safely complete daily activities?: Yes Is the patient deaf or have difficulty hearing?: No Does the patient have difficulty seeing, even when wearing glasses/contacts?: No Does the patient have difficulty concentrating, remembering, or making decisions?: No Patient able to express need for assistance with ADLs?: Yes Does the patient have difficulty dressing or bathing?: No Independently performs ADLs?: Yes (appropriate for developmental age) Does the patient have difficulty walking or climbing stairs?: Yes Weakness of Legs: Both Weakness of Arms/Hands: Both  Permission Sought/Granted Permission sought to share information with : Case Manager, Customer service manager, Family Supports Permission granted to share information with : Yes, Verbal Permission Granted     Permission granted to share info w AGENCY: Home Health        Emotional Assessment Appearance:: Appears stated age Attitude/Demeanor/Rapport: Gracious Affect (typically observed): Calm Orientation: : Oriented to Self, Oriented to Place, Oriented to  Time, Oriented to Situation Alcohol / Substance Use: Not Applicable Psych Involvement: No (comment)  Admission diagnosis:  CHF (congestive heart failure) (HCC) [I50.9] Peripheral edema [R60.9] DOE (dyspnea on exertion) [R06.00] CKD (chronic kidney disease) stage 4, GFR 15-29  ml/min ([place]) [N18.4] Essential hypertension [I10] Anemia due to stage 4 chronic kidney disease ([place]) [N18.4, D63.1] COVID-19 virus not detected [Z03.818] Patient Active Problem List   Diagnosis Date Noted  . CHF (congestive heart failure) ([place]) 06/25/2019  . CKD (chronic kidney disease) stage 4, GFR 15-29  ml/min (HCC)   . Anemia due to stage 4 chronic kidney disease ([place])   . Acute on chronic congestive heart failure ([place]) 02/13/2019  . Hyperkalemia 02/13/2019  . Elevated liver enzymes 02/13/2019  . Atelectasis 12/22/2018  . CAP (community acquired pneumonia) 12/20/2018  . Obesity, Class III, BMI 40-49.9 (morbid obesity) ([place]) 12/20/2018  . Marijuana abuse 12/20/2018  . Dysuria 12/20/2018  . Acute on chronic diastolic heart failure ([place]) 11/06/2018  . 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 Thyroid nodule 09/22/2018  . Anemia, chronic renal failure, stage 3 (moderate) 09/22/2018  . Sleep apnea 09/22/2018  . Precordial chest pain   . Cholelithiasis 07/30/2018  . Acute on chronic combined systolic (congestive) and diastolic (congestive) heart failure ([place]) 07/29/2018  . Dyspnea 07/29/2018  . Tobacco abuse counseling 04/27/2018  . Acute pyelonephritis 04/10/2018  . Hypertensive urgency 04/10/2018  . Leukocytosis 09/10/2017  . Anemia of chronic disease 09/10/2017  . Knee pain, bilateral 01/13/2015  . Bilateral hip pain 11/23/2014  . Diabetes mellitus ([place]) 10/25/2014  . Intertrigo 10/25/2014  . GERD (gastroesophageal reflux disease) 10/25/2014  . Diabetic neuropathy ([place]) 10/25/2014  . Chronic low back pain 10/25/2014  . Tobacco abuse 11/11/2013  . CKD (chronic kidney disease), stage III 09/02/2013  . Essential hypertension 09/02/2013  . Gout 12/05/2012   PCP:  Antony Contras, MD Pharmacy:   [facility] [place] [address] [place] [address] Phone: [phone] Fax: [phone]     Social Determinants of Health (SDOH) Interventions    Readmission Risk Interventions Readmission Risk Prevention Plan 12/22/2018 11/10/2018 09/30/2018  Transportation Screening Complete Complete -  HRI or [place] -  Complete Complete  Social Work Consult for [place] Planning/Counseling - Complete -  Palliative Care Screening - Not Applicable Not Applicable  Medication Review Press photographer) Complete Complete -  PCP or Specialist appointment within 3-5 days of discharge Complete - -  [place] or Home Care Consult Complete - -  Palliative Care Screening Not Applicable - -  [place] Not Applicable - -  Some recent data might be hidden

## 2019-06-29 NOTE — Progress Notes (Addendum)
PROGRESS NOTE    Zoe Clarke  BXU:[id number] DOB: 1950-12-07 DOA: 06/25/2019 PCP: Antony Contras, MD    Brief Narrative:  Zoe Clarke a 69 y.o.femalewith medical history significant ofCKD stage IIIb, hypertension, cholelithiasis, morbid obesity, tobacco abuse, marijuana abuse, chronic diastolic congestive heart failure, anemia of chronic disease presents to emergency department due to worsening shortness of breath.Her symptoms started about 2 days ago, exertional dyspnea with heaviness on the chest, mild intermittent cough nonproductive, no fever chills. Patient also admitted she has trouble control her blood pressure, she has been on 3 different BP meds with poorly controlled blood pressure and she is allergic to several other BP meds. She went to surgeon for preop labs for elective cholecystectomy next week, and was found very tachypneic and sent to the ED for evaluation. ED Course:Patient was found to have fluid overload, chest x-ray showed cardiomegaly,hemoglobin 7.6 compared to 8.9 4 months ago, [place] remained stable  Assessment & Plan:   Active Problems:   Essential hypertension   Acute on chronic diastolic heart failure (HCC)   CHF (congestive heart failure) (HCC)   Metabolic encephalopathy Acute Hypercarbic resp failure secondary to acute on chronic diastolic CHF: -ABG was ordered previously which noted respiratory acidosis, likely secondary to CHF, plus underlying COPD, OSA .  EF 60 to 65% on recent echo. Not being very compliant using the bipap here.  She tells me that she does not have sleep apnea and has never used any CPAP at home.  Clinically improved today.  Continues to be on Lasix 80 mg p.o. twice daily.  Acute on chronic anemia -Hemoglobin 7.5 on admission, baseline closer to 8 -No overt bleeding history, check anemia panel, received 1 unit of PRBC on admission and hemoglobin has remained stable since then.  Stage IV chronic kidney  disease -Baseline creatinine in the 2.5-3 range, -Stable at baseline, monitor with diuresis  DM stage 2 -CBGs are stable, p.o. intake is poor, hold long-acting insulin, continue sliding scale for now  Cholelithiasis -FU with Gen Surgery  Hyperlipidemia: Continue atorvastatin.  DVT prophylaxis:Heparin subcu Code Status:Full code Family Communication:none at bedside Disposition Plan:Likely will need 2  days hospital stay until her symptoms improved,also add PT evaluation Barrier: Still not fully at baseline from respiratory status and overall, need to make adjustments of meds. Monitor labs for worsening renal functional.  Status is: Inpatient  Remains inpatient appropriate because:Unsafe d/c plan   Dispo: The patient is from: Home              Anticipated d/c is to: SNF              Anticipated d/c date is: 2 days              Patient currently is not medically stable to d/c.   Consultants:   Cardiology  Procedures:  None  Antimicrobials:      Subjective: Overnight refused to use BiPAP again. This am c/o some sob. No cp. No other issues  Objective: Vitals:   06/29/19 0428 06/29/19 0744 06/29/19 1100 06/29/19 1405  BP: (!) 162/72 (!) 142/54 (!) 138/56 139/68  Pulse: (!) 103 89 90 84  Resp: 18  18   Temp: 100 F (37.8 C) 98 F (36.7 C) 98 F (36.7 C)   TempSrc: Oral  Oral   SpO2: 95% 98% 98%   Weight: 103.6 kg     Height:        Intake/Output Summary (Last 24 hours) at 06/29/2019 1424  Last data filed at 06/29/2019 1300 Gross per 24 hour  Intake 1038 ml  Output 2201 ml  Net -1163 ml   Filed Weights   06/27/19 0055 06/28/19 0300 06/29/19 0428  Weight: 110.3 kg 106.5 kg 103.6 kg    Examination:  General exam: Appears calm and comfortable  Respiratory system: Clear to auscultation. Respiratory effort normal. Cardiovascular system: S1 & S2 heard, RRR. No JVD, murmurs, rubs, gallops or clicks.  +1 pitting edema bilateral lower  extremity Gastrointestinal system: Abdomen is nondistended, soft and nontender. No organomegaly or masses felt. Normal bowel sounds heard. Central nervous system: Alert and oriented. No focal neurological deficits. Extremities: Symmetric 5 x 5 power.  Skin: No rashes, lesions or ulcers.  Psychiatry: Judgement and insight appear normal. Mood & affect appropriate.   Data Reviewed: I have personally reviewed following labs and imaging studies  CBC: Recent Labs  Lab 06/25/19 0914 06/25/19 0959 06/26/19 0621 06/29/19 0532  WBC 7.1 7.4  --  8.5  HGB 7.5* 7.6* 9.0* 9.2*  HCT 24.9* 25.1* 30.0* 29.5*  MCV 91.9 91.6  --  87.8  PLT 187 199  --  174   Basic Metabolic Panel: Recent Labs  Lab 06/25/19 0959 06/26/19 0621 06/27/19 0425 06/28/19 0413 06/29/19 0532  NA 143 140 141 141 140  K 4.1 5.0 4.4 3.9 4.1  CL 110 106 105 103 100  CO2 23 25 25 26 29   GLUCOSE 159* 159* 95 133* 151*  BUN 37* 38* 43* 46* 43*  CREATININE 2.72* 3.01* 3.16* 3.12* 2.96*  CALCIUM 8.3* 8.3* 8.6* 8.5* 9.0   GFR: Estimated Creatinine Clearance: 23.2 mL/min (A) (by C-G formula based on SCr of 2.96 mg/dL (H)). Liver Function Tests: No results for input(s): AST, ALT, ALKPHOS, BILITOT, PROT, ALBUMIN in the last 168 hours. No results for input(s): LIPASE, AMYLASE in the last 168 hours. No results for input(s): AMMONIA in the last 168 hours. Coagulation Profile: No results for input(s): INR, PROTIME in the last 168 hours. Cardiac Enzymes: No results for input(s): CKTOTAL, CKMB, CKMBINDEX, TROPONINI in the last 168 hours. BNP (last 3 results) No results for input(s): PROBNP in the last 8760 hours. HbA1C: No results for input(s): HGBA1C in the last 72 hours. CBG: Recent Labs  Lab 06/28/19 1629 06/28/19 1712 06/28/19 2113 06/29/19 0613 06/29/19 1105  GLUCAP 129* 121* 122* 153* 180*   Lipid Profile: No results for input(s): CHOL, HDL, LDLCALC, TRIG, CHOLHDL, LDLDIRECT in the last 72 hours. Thyroid  Function Tests: No results for input(s): TSH, T4TOTAL, FREET4, T3FREE, THYROIDAB in the last 72 hours. Anemia Panel: No results for input(s): VITAMINB12, FOLATE, FERRITIN, TIBC, IRON, RETICCTPCT in the last 72 hours. Sepsis Labs: No results for input(s): PROCALCITON, LATICACIDVEN in the last 168 hours.  Recent Results (from the past 240 hour(s))  Respiratory Panel by RT PCR (Flu A&B, Covid) - Nasopharyngeal Swab     Status: None   Collection Time: 06/25/19 11:51 AM   Specimen: Nasopharyngeal Swab  Result Value Ref Range Status   SARS Coronavirus 2 by RT PCR NEGATIVE NEGATIVE Final    Comment: (NOTE) SARS-CoV-2 target nucleic acids are NOT DETECTED. The SARS-CoV-2 RNA is generally detectable in upper respiratoy specimens during the acute phase of infection. The lowest concentration of SARS-CoV-2 viral copies this assay can detect is 131 copies/mL. A negative result does not preclude SARS-Cov-2 infection and should not be used as the sole basis for treatment or other patient management decisions. A negative result may occur with  improper specimen collection/handling, submission of specimen other than nasopharyngeal swab, presence of viral mutation(s) within the areas targeted by this assay, and inadequate number of viral copies (<131 copies/mL). A negative result must be combined with clinical observations, patient history, and epidemiological information. The expected result is Negative. Fact Sheet for Patients:  PinkCheek.be Fact Sheet for Healthcare Providers:  GravelBags.it This test is not yet ap proved or cleared by the [place] FDA and  has been authorized for detection and/or diagnosis of SARS-CoV-2 by FDA under an Emergency Use Authorization (EUA). This EUA will remain  in effect (meaning this test can be used) for the duration of the COVID-19 declaration under Section 564(b)(1) of the Act, 21 U.S.C. section  360bbb-3(b)(1), unless the authorization is terminated or revoked sooner.    Influenza A by PCR NEGATIVE NEGATIVE Final   Influenza B by PCR NEGATIVE NEGATIVE Final    Comment: (NOTE) The Xpert Xpress SARS-CoV-2/FLU/RSV assay is intended as an aid in  the diagnosis of influenza from Nasopharyngeal swab specimens and  should not be used as a sole basis for treatment. Nasal washings and  aspirates are unacceptable for Xpert Xpress SARS-CoV-2/FLU/RSV  testing. Fact Sheet for Patients: PinkCheek.be Fact Sheet for Healthcare Providers: GravelBags.it This test is not yet approved or cleared by the [place] FDA and  has been authorized for detection and/or diagnosis of SARS-CoV-2 by  FDA under an Emergency Use Authorization (EUA). This EUA will remain  in effect (meaning this test can be used) for the duration of the  Covid-19 declaration under Section 564(b)(1) of the Act, 21  U.S.C. section 360bbb-3(b)(1), unless the authorization is  terminated or revoked. Performed at [facility], [place] [address] [place], [place] [address]     Radiology Studies: No results found.  Scheduled Meds:  sodium chloride   Intravenous Once   doxazosin  1 mg Oral Daily   furosemide  80 mg Oral BID   heparin  5,000 Units Subcutaneous Q8H   hydrALAZINE  100 mg Oral Q8H   insulin aspart  0-9 Units Subcutaneous TID WC   isosorbide mononitrate  60 mg Oral Daily   sodium chloride flush  3 mL Intravenous Q12H   Continuous Infusions:  sodium chloride      LOS: 4 days   Time spent: 37 minutes with more than 50% COC  Darliss Cheney, MD [facility]  If 7PM-7AM, please contact night-coverage [email or web address] 06/29/2019, 2:24 PM Patient ID: Arville Care, female   DOB: [birth year], 69 y.o.   MRN: [id number]

## 2019-06-29 NOTE — Progress Notes (Signed)
Patient refused AM hydralazine. States she is nauseous. Prn zofran given. Patient still refuses med. States the medication is causing nausea.

## 2019-06-30 LAB — CBC WITH DIFFERENTIAL/PLATELET
Abs Immature Granulocytes: 0.03 10*3/uL (ref 0.00–0.07)
Basophils Absolute: 0 10*3/uL (ref 0.0–0.1)
Basophils Relative: 0 %
Eosinophils Absolute: 0.3 10*3/uL (ref 0.0–0.5)
Eosinophils Relative: 3 %
HCT: 28.6 % — ABNORMAL LOW (ref 36.0–46.0)
Hemoglobin: 9.1 g/dL — ABNORMAL LOW (ref 12.0–15.0)
Immature Granulocytes: 0 %
Lymphocytes Relative: 16 %
Lymphs Abs: 1.3 10*3/uL (ref 0.7–4.0)
MCH: 27.9 pg (ref 26.0–34.0)
MCHC: 31.8 g/dL (ref 30.0–36.0)
MCV: 87.7 fL (ref 80.0–100.0)
Monocytes Absolute: 0.8 10*3/uL (ref 0.1–1.0)
Monocytes Relative: 9 %
Neutro Abs: 5.9 10*3/uL (ref 1.7–7.7)
Neutrophils Relative %: 72 %
Platelets: 193 10*3/uL (ref 150–400)
RBC: 3.26 MIL/uL — ABNORMAL LOW (ref 3.87–5.11)
RDW: 15.1 % (ref 11.5–15.5)
WBC: 8.3 10*3/uL (ref 4.0–10.5)
nRBC: 0 % (ref 0.0–0.2)

## 2019-06-30 LAB — GLUCOSE, CAPILLARY
Glucose-Capillary: 144 mg/dL — ABNORMAL HIGH (ref 70–99)
Glucose-Capillary: 141 mg/dL — ABNORMAL HIGH (ref 70–99)
Glucose-Capillary: 139 mg/dL — ABNORMAL HIGH (ref 70–99)
Glucose-Capillary: 158 mg/dL — ABNORMAL HIGH (ref 70–99)

## 2019-06-30 LAB — BASIC METABOLIC PANEL
Anion gap: 12 (ref 5–15)
BUN: 45 mg/dL — ABNORMAL HIGH (ref 8–23)
CO2: 30 mmol/L (ref 22–32)
Calcium: 8.6 mg/dL — ABNORMAL LOW (ref 8.9–10.3)
Chloride: 96 mmol/L — ABNORMAL LOW (ref 98–111)
Creatinine, Ser: 3.17 mg/dL — ABNORMAL HIGH (ref 0.44–1.00)
GFR calc Af Amer: 17 mL/min — ABNORMAL LOW (ref >60)
GFR calc non Af Amer: 14 mL/min — ABNORMAL LOW (ref >60)
Glucose, Bld: 144 mg/dL — ABNORMAL HIGH (ref 70–99)
Potassium: 4.2 mmol/L (ref 3.5–5.1)
Sodium: 138 mmol/L (ref 135–145)

## 2019-06-30 MED ORDER — DOXAZOSIN MESYLATE 4 MG PO TABS: 4.0000 mg | ORAL_TABLET | Freq: Two times a day (BID) | ORAL | 0 refills | Status: DC

## 2019-06-30 MED ORDER — LIP MEDEX EX OINT
TOPICAL_OINTMENT | CUTANEOUS | Status: DC | PRN
  Filled 2019-06-30: qty 7

## 2019-06-30 MED ORDER — ISOSORBIDE MONONITRATE ER 60 MG PO TB24: 60.0000 mg | ORAL_TABLET | Freq: Every day | ORAL | 0 refills | Status: AC

## 2019-06-30 MED ORDER — DOXAZOSIN MESYLATE 4 MG PO TABS: 4.0000 mg | ORAL_TABLET | Freq: Two times a day (BID) | ORAL | 0 refills | Status: AC

## 2019-06-30 NOTE — Care Management Important Message (Signed)
Important Message  Patient Details  Name: Zoe Clarke MRN: [id number] Date of Birth: [birth year]   Medicare Important Message Given:  Yes     Shelda Altes 06/30/2019, 9:40 AM

## 2019-06-30 NOTE — Discharge Instructions (Signed)
Chronic Kidney Disease, Adult Chronic kidney disease (CKD) happens when the kidneys are damaged over a long period of time. The kidneys are two organs that help with:  Getting rid of waste and extra fluid from the blood.  Making hormones that maintain the amount of fluid in your tissues and blood vessels.  Making sure that the body has the right amount of fluids and chemicals. Most of the time, CKD does not go away, but it can usually be controlled. Steps must be taken to slow down the kidney damage or to stop it from getting worse. If this is not done, the kidneys may stop working. Follow these instructions at home: Medicines  Take over-the-counter and prescription medicines only as told by your doctor. You may need to change the amount of medicines you take.  Do not take any new medicines unless your doctor says it is okay. Many medicines can make your kidney damage worse.  Do not take any vitamin and supplements unless your doctor says it is okay. Many vitamins and supplements can make your kidney damage worse. General instructions  Follow a diet as told by your doctor. You may need to stay away from: ? Alcohol. ? Salty foods. ? Foods that are high in:  Potassium.  Calcium.  Protein.  Do not use any products that contain nicotine or tobacco, such as cigarettes and e-cigarettes. If you need help quitting, ask your doctor.  Keep track of your blood pressure at home. Tell your doctor about any changes.  If you have diabetes, keep track of your blood sugar as told by your doctor.  Try to stay at a healthy weight. If you need help, ask your doctor.  Exercise at least 30 minutes a day, 5 days a week.  Stay up-to-date with your shots (immunizations) as told by your doctor.  Keep all follow-up visits as told by your doctor. This is important. Contact a doctor if:  Your symptoms get worse.  You have new symptoms. Get help right away if:  You have symptoms of end-stage  kidney disease. These may include: ? Headaches. ? Numbness in your hands or feet. ? Easy bruising. ? Having hiccups often. ? Chest pain. ? Shortness of breath. ? Stopping of menstrual periods in women.  You have a fever.  You have very little pee (urine).  You have pain or bleeding when you pee. Summary  Chronic kidney disease (CKD) happens when the kidneys are damaged over a long period of time.  Most of the time, this condition does not go away, but it can usually be controlled. Steps must be taken to slow down the kidney damage or to stop it from getting worse.  Treatment may include a combination of medicines and lifestyle changes. This information is not intended to replace advice given to you by your health care provider. Make sure you discuss any questions you have with your health care provider. Document Revised: 02/15/2017 Document Reviewed: 04/09/2016 [employer] Patient Education  2020 [employer]  

## 2019-06-30 NOTE — Progress Notes (Signed)
Brief cardiology progress note: Informed that surgery initially scheduled for tomorrow has been pushed back. Plan is to discharge and then have surgery as an outpatient.  [facility] will sign off in anticipation of discharge.   Medication Recommendations:  Continue atorvastatin 40 mg daily Continue hydralazine 100 mg TID Imdur dose increased this admission, continue 60 mg daily at discharge Return to outpatient dosing of doxazosin 4 mg daily (currently on 1 mg while admitted) Return to outpatient dosing of furosemide 80 mg BID  Other recommendations (labs, testing, etc):  none Follow up as an outpatient:  We will arrange for outpatient follow up with Dr. Oval Linsey or a member of her team.

## 2019-06-30 NOTE — Discharge Summary (Signed)
Physician Discharge Summary  Zoe Clarke BMW:[id number] DOB: 1950/06/14 DOA: 06/25/2019  PCP: Antony Contras, MD  Admit date: 06/25/2019 Discharge date: 06/30/2019  Admitted From: Home Disposition: Home with home health  Recommendations for Outpatient Follow-up:  1. Follow up with PCP in 1-2 weeks 2. Follow-up with cardiology/Dr. Oval Linsey in couple of weeks as cardiology schedules 3. Follow-up with Dr. Sherrin Daisy surgeon to schedule cholecystectomy 4. Please obtain BMP/CBC in one week 5. Please follow up on the following pending results:  Home Health: Yes Equipment/Devices: None  Discharge Condition: Stable CODE STATUS: Full code Diet recommendation: Cardiac  Subjective: Seen and examined.  Feels much better.  No shortness of breath or chest pain.  Does not want to go to SNF.  Wants to go home with home health instead.  Brief/Interim Summary: Zoe Mousel Carteris a 69 y.o.femalewith medical history significant ofCKD stage IIIb, hypertension, cholelithiasis, morbid obesity, tobacco abuse, marijuana abuse, chronic diastolic congestive heart failure, anemia of chronic disease presented to emergency department due to worsening shortness of breath.Her symptoms started about 2 days ago, exertional dyspnea with heaviness on the chest, mild intermittent cough nonproductive, no fever chills. Patient also admitted she has trouble control her blood pressure, she has been on 3 different BP meds with poorly controlled blood pressure and she is allergic to several other BP meds. She went to surgeon for preop labs for elective cholecystectomy next week, and was found very tachypneic and sent to the ED for evaluation.  Upon arrival to ED, chest x-ray showed cardiomegaly and patient was found to have fluid overloaded with hemoglobin of 7.6 compared to 8.9 just 4 months ago.  Her anemia was secondary to anemia of chronic disease.  Her hemoglobin dropped and she received 1 unit of PRBC  transfusion at the time of admission.  Hemoglobin remained stable after that.  During this hospitalization, she also had acute hypercarbic respiratory failure which was secondary to acute on chronic diastolic CHF.  She received brief.  Of BiPAP here.  She also has history of sleep apnea for which she was not using CPAP and continued to refuse that here as well.  She was diuresed with Lasix.  Now she is feeling much better.  She is off of oxygen.  We checked her ambulatory oximetry and she remained over 90% all the time with activity and at rest so she does not require any oxygen.  She has been cleared by cardiology to go home.  Her acute on chronic diastolic congestive heart failure was thought to be secondary to uncontrolled hypertension.  Her blood pressure is better but still elevated.  She unfortunately is allergic to several medications.  The only medications that she is tolerating currently is Imdur, doxazosin and hydralazine.  She is on hydralazine 100 mg 3 times daily which has been resumed.  Her Imdur was 30 mg p.o. daily which was increased to 60 mg here and she is being discharged on 60 mg now daily.  Her doxazosin is being doubled from 4 mg once daily to 4 mg p.o. twice daily.  She will follow with cardiology, PCP.  She is being discharged in stable condition.   Discharge Diagnoses:  Active Problems:   Essential hypertension   Acute on chronic diastolic heart failure (HCC)   CHF (congestive heart failure) ([place])    Discharge Instructions   Allergies as of 06/30/2019      Reactions   Aspirin Other (See Comments)   Feels funny    Allopurinol  Dizzy; hard to ambulate  Couldn't see; sweating   Amlodipine Other (See Comments)   headache   Carvedilol    wheezing   Clonidine Derivatives    HEADACHES   Lisinopril Swelling   Face swelling   Other Itching   Squash      Medication List    TAKE these medications   albuterol 108 (90 Base) MCG/ACT inhaler Commonly known as: VENTOLIN  HFA Inhale 2 puffs into the lungs every 6 (six) hours as needed for wheezing or shortness of breath.   atorvastatin 40 MG tablet Commonly known as: LIPITOR Take 40 mg by mouth daily.   cyclobenzaprine 5 MG tablet Commonly known as: FLEXERIL Take 5 mg by mouth at bedtime as needed for muscle pain.   diclofenac Sodium 1 % Gel Commonly known as: VOLTAREN Apply 2 g topically 4 (four) times daily.   diphenhydramine-acetaminophen 25-500 MG Tabs tablet Commonly known as: TYLENOL PM Take 2 tablets by mouth at bedtime as needed (for pain).   doxazosin 4 MG tablet Commonly known as: CARDURA Take 1 tablet (4 mg total) by mouth 2 (two) times daily. What changed: when to take this   ergocalciferol 1.25 MG (50000 UT) capsule Commonly known as: VITAMIN D2 Take 50,000 Units by mouth every Friday.   ferrous sulfate 325 (65 FE) MG tablet Take 325 mg by mouth daily with breakfast.   fexofenadine 180 MG tablet Commonly known as: ALLEGRA Take 180 mg by mouth daily as needed for allergies.   furosemide 80 MG tablet Commonly known as: LASIX Take 1 tablet (80 mg total) by mouth 2 (two) times daily.   gabapentin 300 MG capsule Commonly known as: NEURONTIN Take 1 capsule (300 mg total) by mouth 2 (two) times daily. What changed:   how much to take  when to take this   glipiZIDE 10 MG 24 hr tablet Commonly known as: GLUCOTROL XL Take 10 mg by mouth in the morning and at bedtime.   hydrALAZINE 100 MG tablet Commonly known as: APRESOLINE Take 1 tablet (100 mg total) by mouth 3 (three) times daily.   hydrocortisone cream 1 % Apply topically 2 (two) times daily. Areas of itch What changed:   how much to take  when to take this  reasons to take this  additional instructions   isosorbide mononitrate 60 MG 24 hr tablet Commonly known as: IMDUR Take 1 tablet (60 mg total) by mouth daily. Start taking on: July 01, 2019 What changed:   medication strength  how much to take    Linzess 145 MCG Caps capsule Generic drug: linaclotide Take 145 mcg by mouth daily as needed (constipation).   oxyCODONE-acetaminophen 5-325 MG tablet Commonly known as: PERCOCET/ROXICET Take 1 tablet by mouth every 6 (six) hours as needed for moderate pain or severe pain.   pantoprazole 40 MG tablet Commonly known as: PROTONIX Take 1 tablet (40 mg total) by mouth daily.   polyethylene glycol 17 g packet Commonly known as: MIRALAX / GLYCOLAX Take 17 g by mouth daily as needed for mild constipation.   triamcinolone cream 0.1 % Commonly known as: KENALOG Apply 1 application topically daily as needed (itching).   triamcinolone ointment 0.1 % Commonly known as: KENALOG Apply 1 application topically daily as needed (ear itching).      Follow-up Information    Antony Contras, MD. Go on 07/14/2019.   Specialty: Family Medicine Why: @12 :15pm Contact information: [place] [address] [phone]  Skeet Latch, MD.   Specialty: Cardiology Contact information: [address] [place] [address] [phone]          Allergies  Allergen Reactions  . Aspirin Other (See Comments)    Feels funny   . Allopurinol     Dizzy; hard to ambulate  Couldn't see; sweating  . Amlodipine Other (See Comments)    headache  . Carvedilol     wheezing  . Clonidine Derivatives     HEADACHES  . Lisinopril Swelling    Face swelling  . Other Itching    Squash    Consultations: Cardiology   Procedures/Studies: DG CHEST PORT 1 VIEW  Result Date: 06/26/2019 CLINICAL DATA:  Congestive heart failure. EXAM: PORTABLE CHEST 1 VIEW COMPARISON:  06/25/2019 FINDINGS: The heart is enlarged but stable. Stable central vascular congestion without overt pulmonary edema. No definite pleural effusions. IMPRESSION: Cardiac enlargement and central vascular congestion but no edema or effusions. Electronically Signed   By: Marijo Sanes M.D.   On:  06/26/2019 08:59   DG Chest Portable 1 View  Result Date: 06/25/2019 CLINICAL DATA:  Shortness of breath EXAM: PORTABLE CHEST 1 VIEW COMPARISON:  February 13, 2019 FINDINGS: Lungs are clear. Heart is mildly enlarged with pulmonary vascularity normal. No adenopathy. No bone lesions. IMPRESSION: Mild cardiac prominence.  Lungs clear.  No adenopathy evident. Electronically Signed   By: Lowella Grip III M.D.   On: 06/25/2019 11:28   ECHOCARDIOGRAM COMPLETE  Result Date: 06/26/2019    ECHOCARDIOGRAM REPORT   Patient Name:   SHAMONA WIRTZ Date of Exam: 06/26/2019 Medical Rec #:  [id number]           Height:       68.0 in Accession #:    [id number]          Weight:       249.3 lb Date of Birth:  [birth year]           BSA:          2.245 m Patient Age:    64 years            BP:           153/54 mmHg Patient Gender: F                   HR:           98 bpm. Exam Location:  Inpatient Procedure: 2D Echo, Color Doppler and Cardiac Doppler Indications:    P10.25 Acute diastolic (congestive) heart failure  History:        Patient has prior history of Echocardiogram examinations, most                 recent 02/13/2019. CHF; Risk Factors:Hypertension, Diabetes and                 Sleep Apnea.  Sonographer:    Raquel Sarna Senior RDCS Referring Phys: [id number] [place]  1. Left ventricular ejection fraction, by estimation, is 60 to 65%. The left ventricle has normal function. The left ventricle has no regional wall motion abnormalities. Left ventricular diastolic parameters are consistent with Grade I diastolic dysfunction (impaired relaxation). There is evidence of elevated mean left atrial pressure.  2. Right ventricular systolic function is normal. The right ventricular size is normal. There is mildly elevated pulmonary artery systolic pressure.  3. The mitral valve is normal in structure. No evidence of mitral valve regurgitation. No evidence of mitral  stenosis.  4. The aortic valve is normal in structure.  Aortic valve regurgitation is mild to moderate. No aortic stenosis is present.  5. The inferior vena cava is normal in size with greater than 50% respiratory variability, suggesting right atrial pressure of 3 mmHg.  6. Left atrial size was mildly dilated.  7. Right atrial size was mildly dilated. Comparison(s): Prior images reviewed side by side. Changes from prior study are noted. The left ventricular function has improved. Mitral insufficiency is no longer seen and PA systolic presure is lower. There is still evidence of diastolic dysfunction with elevated mean left atrial pressure, although improved from the previous study. FINDINGS  Left Ventricle: Left ventricular ejection fraction, by estimation, is 60 to 65%. The left ventricle has normal function. The left ventricle has no regional wall motion abnormalities. The left ventricular internal cavity size was normal in size. There is  no left ventricular hypertrophy. Left ventricular diastolic parameters are consistent with Grade I diastolic dysfunction (impaired relaxation). Increased E/e' ratio is consistent with high mean left atrial pressure. Right Ventricle: The right ventricular size is normal. No increase in right ventricular wall thickness. Right ventricular systolic function is normal. There is mildly elevated pulmonary artery systolic pressure. The tricuspid regurgitant velocity is 2.48  m/s, and with an assumed right atrial pressure of 15 mmHg, the estimated right ventricular systolic pressure is 83.1 mmHg. Left Atrium: Left atrial size was mildly dilated. Right Atrium: Right atrial size was mildly dilated. Pericardium: There is no evidence of pericardial effusion. Mitral Valve: The mitral valve is normal in structure. Normal mobility of the mitral valve leaflets. No evidence of mitral valve regurgitation. No evidence of mitral valve stenosis. Tricuspid Valve: The tricuspid valve is normal in structure. Tricuspid valve regurgitation is mild . No  evidence of tricuspid stenosis. Aortic Valve: The aortic valve is normal in structure. Aortic valve regurgitation is mild to moderate. No aortic stenosis is present. Pulmonic Valve: The pulmonic valve was grossly normal. Pulmonic valve regurgitation is not visualized. No evidence of pulmonic stenosis. Aorta: The aortic root is normal in size and structure. Venous: The inferior vena cava is normal in size with greater than 50% respiratory variability, suggesting right atrial pressure of 3 mmHg. IAS/Shunts: No atrial level shunt detected by color flow Doppler.  LEFT VENTRICLE PLAX 2D LVIDd:         3.80 cm  Diastology LVIDs:         3.00 cm  LV e' lateral:   7.07 cm/s LV PW:         1.40 cm  LV E/e' lateral: 15.1 LV IVS:        2.10 cm  LV e' medial:    4.68 cm/s LVOT diam:     2.20 cm  LV E/e' medial:  22.9 LV SV:         98 LV SV Index:   44 LVOT Area:     3.80 cm  RIGHT VENTRICLE RV S prime:     11.00 cm/s TAPSE (M-mode): 1.8 cm LEFT ATRIUM             Index       RIGHT ATRIUM           Index LA diam:        4.10 cm 1.83 cm/m  RA Area:     20.50 cm LA Vol (A2C):   65.0 ml 28.96 ml/m RA Volume:   59.80 ml  26.64 ml/m LA Vol (A4C):  60.0 ml 26.73 ml/m LA Biplane Vol: 63.5 ml 28.29 ml/m  AORTIC VALVE LVOT Vmax:   135.00 cm/s LVOT Vmean:  93.800 cm/s LVOT VTI:    0.259 m  AORTA Ao Root diam: 3.50 cm Ao Asc diam:  3.20 cm MITRAL VALVE                TRICUSPID VALVE MV Area (PHT): 3.39 cm     TR Peak grad:   24.6 mmHg MV Decel Time: 224 msec     TR Vmax:        248.00 cm/s MV E velocity: 107.00 cm/s MV A velocity: 126.00 cm/s  SHUNTS MV E/A ratio:  0.85         Systemic VTI:  0.26 m                             Systemic Diam: 2.20 cm Dani Gobble Croitoru MD Electronically signed by Sanda Klein MD Signature Date/Time: 06/26/2019/11:22:50 AM    Final    VAS [place] LOWER EXTREMITY VENOUS (DVT) (ONLY [facility] & [facility])  Result Date: 06/25/2019  Lower Venous DVTStudy Indications: Swelling.  Comparison Study: no prior Performing  Technologist: June Leap RDMS, RVT  Examination Guidelines: A complete evaluation includes B-mode imaging, spectral Doppler, color Doppler, and power Doppler as needed of all accessible portions of each vessel. Bilateral testing is considered an integral part of a complete examination. Limited examinations for reoccurring indications may be performed as noted. The reflux portion of the exam is performed with the patient in reverse Trendelenburg.  +-----+---------------+---------+-----------+----------+--------------+ RIGHTCompressibilityPhasicitySpontaneityPropertiesThrombus Aging +-----+---------------+---------+-----------+----------+--------------+ CFV  Full           Yes      Yes                                 +-----+---------------+---------+-----------+----------+--------------+   +---------+---------------+---------+-----------+----------+--------------+ LEFT     CompressibilityPhasicitySpontaneityPropertiesThrombus Aging +---------+---------------+---------+-----------+----------+--------------+ CFV      Full           Yes      Yes                                 +---------+---------------+---------+-----------+----------+--------------+ SFJ      Full                                                        +---------+---------------+---------+-----------+----------+--------------+ FV Prox  Full                                                        +---------+---------------+---------+-----------+----------+--------------+ FV Mid   Full                                                        +---------+---------------+---------+-----------+----------+--------------+ FV DistalFull                                                        +---------+---------------+---------+-----------+----------+--------------+  PFV      Full                                                         +---------+---------------+---------+-----------+----------+--------------+ POP      Full           Yes      Yes                                 +---------+---------------+---------+-----------+----------+--------------+ PTV      Full                                                        +---------+---------------+---------+-----------+----------+--------------+ PERO     Full                                                        +---------+---------------+---------+-----------+----------+--------------+     Summary: RIGHT: - No evidence of common femoral vein obstruction.  LEFT: - There is no evidence of deep vein thrombosis in the lower extremity.  - No cystic structure found in the popliteal fossa.  *See table(s) above for measurements and observations. Electronically signed by Curt Jews MD on 06/25/2019 at 3:13:04 PM.    Final      Discharge Exam: Vitals:   06/30/19 0932 06/30/19 1216  BP: (!) 170/67 (!) 140/52  Pulse: 85 72  Resp:    Temp:    SpO2: 97% 91%   Vitals:   06/29/19 1941 06/30/19 0408 06/30/19 0932 06/30/19 1216  BP: (!) 149/52 (!) 156/61 (!) 170/67 (!) 140/52  Pulse: 81 83 85 72  Resp: 18 18    Temp: 98.3 F (36.8 C) 97.8 F (36.6 C)    TempSrc: Oral     SpO2: 96% 94% 97% 91%  Weight:  102.4 kg    Height:        General: Pt is alert, awake, not in acute distress Cardiovascular: RRR, S1/S2 +, no rubs, no gallops Respiratory: CTA bilaterally, no wheezing, no rhonchi Abdominal: Soft, NT, ND, bowel sounds + Extremities: Trace pitting edema, no cyanosis    The results of significant diagnostics from this hospitalization (including imaging, microbiology, ancillary and laboratory) are listed below for reference.     Microbiology: Recent Results (from the past 240 hour(s))  Respiratory Panel by RT PCR (Flu A&B, Covid) - Nasopharyngeal Swab     Status: None   Collection Time: 06/25/19 11:51 AM   Specimen: Nasopharyngeal Swab  Result Value Ref  Range Status   SARS Coronavirus 2 by RT PCR NEGATIVE NEGATIVE Final    Comment: (NOTE) SARS-CoV-2 target nucleic acids are NOT DETECTED. The SARS-CoV-2 RNA is generally detectable in upper respiratoy specimens during the acute phase of infection. The lowest concentration of SARS-CoV-2 viral copies this assay can detect is 131 copies/mL. A negative result does not preclude SARS-Cov-2 infection and should not be used as the sole basis for treatment or  other patient management decisions. A negative result may occur with  improper specimen collection/handling, submission of specimen other than nasopharyngeal swab, presence of viral mutation(s) within the areas targeted by this assay, and inadequate number of viral copies (<131 copies/mL). A negative result must be combined with clinical observations, patient history, and epidemiological information. The expected result is Negative. Fact Sheet for Patients:  PinkCheek.be Fact Sheet for Healthcare Providers:  GravelBags.it This test is not yet ap proved or cleared by the [place] FDA and  has been authorized for detection and/or diagnosis of SARS-CoV-2 by FDA under an Emergency Use Authorization (EUA). This EUA will remain  in effect (meaning this test can be used) for the duration of the COVID-19 declaration under Section 564(b)(1) of the Act, 21 U.S.C. section 360bbb-3(b)(1), unless the authorization is terminated or revoked sooner.    Influenza A by PCR NEGATIVE NEGATIVE Final   Influenza B by PCR NEGATIVE NEGATIVE Final    Comment: (NOTE) The Xpert Xpress SARS-CoV-2/FLU/RSV assay is intended as an aid in  the diagnosis of influenza from Nasopharyngeal swab specimens and  should not be used as a sole basis for treatment. Nasal washings and  aspirates are unacceptable for Xpert Xpress SARS-CoV-2/FLU/RSV  testing. Fact Sheet for  Patients: PinkCheek.be Fact Sheet for Healthcare Providers: GravelBags.it This test is not yet approved or cleared by the [place] FDA and  has been authorized for detection and/or diagnosis of SARS-CoV-2 by  FDA under an Emergency Use Authorization (EUA). This EUA will remain  in effect (meaning this test can be used) for the duration of the  Covid-19 declaration under Section 564(b)(1) of the Act, 21  U.S.C. section 360bbb-3(b)(1), unless the authorization is  terminated or revoked. Performed at [facility], [place] [address], [place], [place] [address]      Labs: BNP (last 3 results) Recent Labs    02/13/19 1102 06/25/19 1049 06/28/19 0413  BNP 2,250.1* 819.9* 008.6*   Basic Metabolic Panel: Recent Labs  Lab 06/26/19 0621 06/27/19 0425 06/28/19 0413 06/29/19 0532 06/30/19 0454  NA 140 141 141 140 138  K 5.0 4.4 3.9 4.1 4.2  CL 106 105 103 100 96*  CO2 25 25 26 29 30   GLUCOSE 159* 95 133* 151* 144*  BUN 38* 43* 46* 43* 45*  CREATININE 3.01* 3.16* 3.12* 2.96* 3.17*  CALCIUM 8.3* 8.6* 8.5* 9.0 8.6*   Liver Function Tests: No results for input(s): AST, ALT, ALKPHOS, BILITOT, PROT, ALBUMIN in the last 168 hours. No results for input(s): LIPASE, AMYLASE in the last 168 hours. No results for input(s): AMMONIA in the last 168 hours. CBC: Recent Labs  Lab 06/25/19 0914 06/25/19 0959 06/26/19 0621 06/29/19 0532 06/30/19 0454  WBC 7.1 7.4  --  8.5 8.3  NEUTROABS  --   --   --   --  5.9  HGB 7.5* 7.6* 9.0* 9.2* 9.1*  HCT 24.9* 25.1* 30.0* 29.5* 28.6*  MCV 91.9 91.6  --  87.8 87.7  PLT 187 199  --  193 193   Cardiac Enzymes: No results for input(s): CKTOTAL, CKMB, CKMBINDEX, TROPONINI in the last 168 hours. BNP: Invalid input(s): POCBNP CBG: Recent Labs  Lab 06/29/19 1105 06/29/19 1626 06/29/19 2135 06/30/19 0615 06/30/19 1118  GLUCAP 180* 144* 141* 139* 158*   D-Dimer No results for  input(s): DDIMER in the last 72 hours. Hgb A1c No results for input(s): HGBA1C in the last 72 hours. Lipid Profile No results for input(s): CHOL, HDL,  LDLCALC, TRIG, CHOLHDL, LDLDIRECT in the last 72 hours. Thyroid function studies No results for input(s): TSH, T4TOTAL, T3FREE, THYROIDAB in the last 72 hours.  Invalid input(s): FREET3 Anemia work up No results for input(s): VITAMINB12, FOLATE, FERRITIN, TIBC, IRON, RETICCTPCT in the last 72 hours. Urinalysis    Component Value Date/Time   COLORURINE YELLOW 02/13/2019 1103   APPEARANCEUR CLEAR 02/13/2019 1103   LABSPEC 1.013 02/13/2019 1103   PHURINE 6.0 02/13/2019 1103   GLUCOSEU 50 (A) 02/13/2019 1103   HGBUR NEGATIVE 02/13/2019 1103   BILIRUBINUR NEGATIVE 02/13/2019 1103   BILIRUBINUR negative 02/25/2015 1426   KETONESUR NEGATIVE 02/13/2019 1103   PROTEINUR >=300 (A) 02/13/2019 1103   UROBILINOGEN 0.2 02/25/2015 1426   UROBILINOGEN 1.0 04/16/2009 0943   NITRITE NEGATIVE 02/13/2019 1103   LEUKOCYTESUR NEGATIVE 02/13/2019 1103   Sepsis Labs Invalid input(s): PROCALCITONIN,  WBC,  LACTICIDVEN Microbiology Recent Results (from the past 240 hour(s))  Respiratory Panel by RT PCR (Flu A&B, Covid) - Nasopharyngeal Swab     Status: None   Collection Time: 06/25/19 11:51 AM   Specimen: Nasopharyngeal Swab  Result Value Ref Range Status   SARS Coronavirus 2 by RT PCR NEGATIVE NEGATIVE Final    Comment: (NOTE) SARS-CoV-2 target nucleic acids are NOT DETECTED. The SARS-CoV-2 RNA is generally detectable in upper respiratoy specimens during the acute phase of infection. The lowest concentration of SARS-CoV-2 viral copies this assay can detect is 131 copies/mL. A negative result does not preclude SARS-Cov-2 infection and should not be used as the sole basis for treatment or other patient management decisions. A negative result may occur with  improper specimen collection/handling, submission of specimen other than nasopharyngeal  swab, presence of viral mutation(s) within the areas targeted by this assay, and inadequate number of viral copies (<131 copies/mL). A negative result must be combined with clinical observations, patient history, and epidemiological information. The expected result is Negative. Fact Sheet for Patients:  PinkCheek.be Fact Sheet for Healthcare Providers:  GravelBags.it This test is not yet ap proved or cleared by the [place] FDA and  has been authorized for detection and/or diagnosis of SARS-CoV-2 by FDA under an Emergency Use Authorization (EUA). This EUA will remain  in effect (meaning this test can be used) for the duration of the COVID-19 declaration under Section 564(b)(1) of the Act, 21 U.S.C. section 360bbb-3(b)(1), unless the authorization is terminated or revoked sooner.    Influenza A by PCR NEGATIVE NEGATIVE Final   Influenza B by PCR NEGATIVE NEGATIVE Final    Comment: (NOTE) The Xpert Xpress SARS-CoV-2/FLU/RSV assay is intended as an aid in  the diagnosis of influenza from Nasopharyngeal swab specimens and  should not be used as a sole basis for treatment. Nasal washings and  aspirates are unacceptable for Xpert Xpress SARS-CoV-2/FLU/RSV  testing. Fact Sheet for Patients: PinkCheek.be Fact Sheet for Healthcare Providers: GravelBags.it This test is not yet approved or cleared by the [place] FDA and  has been authorized for detection and/or diagnosis of SARS-CoV-2 by  FDA under an Emergency Use Authorization (EUA). This EUA will remain  in effect (meaning this test can be used) for the duration of the  Covid-19 declaration under Section 564(b)(1) of the Act, 21  U.S.C. section 360bbb-3(b)(1), unless the authorization is  terminated or revoked. Performed at [facility], [place] [address], [place], [place] [address]      Time coordinating  discharge: Over 30 minutes  SIGNED:   Darliss Cheney, MD  [facility]  06/30/2019, 12:37 PM  If 7PM-7AM, please contact night-coverage [email or web address]

## 2019-06-30 NOTE — Progress Notes (Signed)
SATURATION QUALIFICATIONS: (This note is used to comply with regulatory documentation for home oxygen)  Patient Saturations on Room Air at Rest = 95%  Patient Saturations on Room Air while Ambulating = 96%    Please briefly explain why patient needs home oxygen: pt does not need home oxygen

## 2019-07-01 ENCOUNTER — Encounter (HOSPITAL_COMMUNITY): Admission: RE | Payer: Self-pay | Source: Home / Self Care

## 2019-07-01 ENCOUNTER — Ambulatory Visit (HOSPITAL_COMMUNITY): Admission: RE | Admit: 2019-07-01 | Payer: PPO | Source: Home / Self Care | Admitting: Surgery

## 2019-07-01 SURGERY — LAPAROSCOPIC CHOLECYSTECTOMY
Anesthesia: General

## 2019-07-07 ENCOUNTER — Emergency Department (HOSPITAL_COMMUNITY): Payer: PPO

## 2019-07-07 ENCOUNTER — Encounter (HOSPITAL_COMMUNITY): Payer: Self-pay | Admitting: Emergency Medicine

## 2019-07-07 ENCOUNTER — Other Ambulatory Visit: Payer: Self-pay

## 2019-07-07 ENCOUNTER — Emergency Department (HOSPITAL_COMMUNITY)
Admission: EM | Admit: 2019-07-07 | Discharge: 2019-07-07 | Disposition: A | Discharge status: Home / Self Care | Payer: PPO | Attending: Emergency Medicine | Admitting: Emergency Medicine

## 2019-07-07 DIAGNOSIS — Z7984 Long term (current) use of oral hypoglycemic drugs: Secondary | ICD-10-CM | POA: Insufficient documentation

## 2019-07-07 DIAGNOSIS — Y939 Activity, unspecified: Secondary | ICD-10-CM | POA: Diagnosis not present

## 2019-07-07 DIAGNOSIS — F17210 Nicotine dependence, cigarettes, uncomplicated: Secondary | ICD-10-CM | POA: Diagnosis not present

## 2019-07-07 DIAGNOSIS — I130 Hypertensive heart and chronic kidney disease with heart failure and stage 1 through stage 4 chronic kidney disease, or unspecified chronic kidney disease: Secondary | ICD-10-CM | POA: Insufficient documentation

## 2019-07-07 DIAGNOSIS — I5032 Chronic diastolic (congestive) heart failure: Secondary | ICD-10-CM | POA: Insufficient documentation

## 2019-07-07 DIAGNOSIS — I499 Cardiac arrhythmia, unspecified: Secondary | ICD-10-CM | POA: Diagnosis not present

## 2019-07-07 DIAGNOSIS — Z79899 Other long term (current) drug therapy: Secondary | ICD-10-CM | POA: Diagnosis not present

## 2019-07-07 DIAGNOSIS — S8991XA Unspecified injury of right lower leg, initial encounter: Secondary | ICD-10-CM | POA: Diagnosis present

## 2019-07-07 DIAGNOSIS — W1830XA Fall on same level, unspecified, initial encounter: Secondary | ICD-10-CM | POA: Insufficient documentation

## 2019-07-07 DIAGNOSIS — S82831A Other fracture of upper and lower end of right fibula, initial encounter for closed fracture: Secondary | ICD-10-CM | POA: Diagnosis not present

## 2019-07-07 DIAGNOSIS — E1122 Type 2 diabetes mellitus with diabetic chronic kidney disease: Secondary | ICD-10-CM | POA: Insufficient documentation

## 2019-07-07 DIAGNOSIS — N184 Chronic kidney disease, stage 4 (severe): Secondary | ICD-10-CM | POA: Insufficient documentation

## 2019-07-07 DIAGNOSIS — Y999 Unspecified external cause status: Secondary | ICD-10-CM | POA: Insufficient documentation

## 2019-07-07 DIAGNOSIS — R52 Pain, unspecified: Secondary | ICD-10-CM | POA: Diagnosis not present

## 2019-07-07 DIAGNOSIS — R404 Transient alteration of awareness: Secondary | ICD-10-CM | POA: Diagnosis not present

## 2019-07-07 DIAGNOSIS — S79911A Unspecified injury of right hip, initial encounter: Secondary | ICD-10-CM | POA: Diagnosis not present

## 2019-07-07 DIAGNOSIS — R0902 Hypoxemia: Secondary | ICD-10-CM | POA: Diagnosis not present

## 2019-07-07 DIAGNOSIS — M25561 Pain in right knee: Secondary | ICD-10-CM | POA: Diagnosis not present

## 2019-07-07 DIAGNOSIS — Y92013 Bedroom of single-family (private) house as the place of occurrence of the external cause: Secondary | ICD-10-CM | POA: Insufficient documentation

## 2019-07-07 DIAGNOSIS — M25551 Pain in right hip: Secondary | ICD-10-CM | POA: Diagnosis not present

## 2019-07-07 DIAGNOSIS — W19XXXA Unspecified fall, initial encounter: Secondary | ICD-10-CM

## 2019-07-07 MED ORDER — OXYCODONE-ACETAMINOPHEN 5-325 MG PO TABS: 1.0000 | ORAL_TABLET | Freq: Once | ORAL | Status: DC

## 2019-07-07 NOTE — ED Triage Notes (Signed)
Pt arrives via EMS- Pt's husband found pt on floor by her bed- had been laying there for approx 30 min. Mechanical fall as she was trying to go to the bedside commode. Pt complains of right knee and hip pain. Did not hit head or lose consciousness. Pt usually walks with walker. Denies neck/back pain. Pt unable to stand after fall. BP 170/60, HR 50-90's per EMS. 93% on room air.

## 2019-07-07 NOTE — Progress Notes (Signed)
Orthopedic Tech Progress Note Patient Details:  Zoe Clarke 01-Nov-1950 [id number] Zoe Clarke to apply CAM Gilford Rile but EMTS had to get patient cleaned first Patient ID: Zoe Clarke, female   DOB: 1950-06-07, 69 y.o.   MRN: [id number]   Zoe Clarke 07/07/2019, 6:40 PM

## 2019-07-07 NOTE — ED Notes (Signed)
Pt discharge instructions reviewed with the patient. The patient verbalized understanding of instructions. Pt discharged. 

## 2019-07-07 NOTE — ED Provider Notes (Signed)
[facility] EMERGENCY DEPARTMENT Provider Note   CSN: [id number] Arrival date & time: 07/07/19  1227     History Chief Complaint  Patient presents with  . Fall    Zoe Clarke is a 69 y.o. female.  HPI    Patient presents with concern of pain around her right lower extremity following a fall.  Patient notes that she uses a walker, sometimes a cane for stability, but today had a mechanical fall, without head trauma, without loss of consciousness, but with subsequent development of pain largely in the calf, less so in the right hip.  Pain is sore, worse with attempted ambulation, no distal loss of sensation or weakness.  She is here with her husband who assists with the HPI.  Past Medical History:  Diagnosis Date  . Allergy   . Arthritis   . Asthma    weazing at night uses inhalers  . Bronchitis    history of  . Chronic diastolic CHF (congestive heart failure) ([place]) 04/2018  . CKD stage 4 due to type 2 diabetes mellitus ([place])   . Diabetes mellitus without complication ([place])   . Gastric ulcer   . GERD (gastroesophageal reflux disease)   . History of hiatal hernia   . Hypertension   . Osteoporosis   . Sleep apnea 09/22/2018    Patient Active Problem List   Diagnosis Date Noted  . CHF (congestive heart failure) ([place]) 06/25/2019  . CKD (chronic kidney disease) stage 4, GFR 15-29 ml/min ([facility])   . Anemia due to stage 4 chronic kidney disease ([place])   . Acute on chronic congestive heart failure ([place]) 02/13/2019  . Hyperkalemia 02/13/2019  . Elevated liver enzymes 02/13/2019  . Atelectasis 12/22/2018  . CAP (community acquired pneumonia) 12/20/2018  . Obesity, Class III, BMI 40-49.9 (morbid obesity) ([place]) 12/20/2018  . Marijuana abuse 12/20/2018  . Dysuria 12/20/2018  . Acute on chronic diastolic heart failure ([place]) 11/06/2018  . 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 Thyroid nodule 09/22/2018  . Anemia, chronic renal failure, stage 3 (moderate) 09/22/2018  . Sleep apnea 09/22/2018  . Precordial chest pain   . Cholelithiasis 07/30/2018  . Acute on chronic combined systolic (congestive) and diastolic (congestive) heart failure ([place]) 07/29/2018  . Dyspnea 07/29/2018  . Tobacco abuse counseling 04/27/2018  . Acute pyelonephritis 04/10/2018  . Hypertensive urgency 04/10/2018  . Leukocytosis 09/10/2017  . Anemia of chronic disease 09/10/2017  . Knee pain, bilateral 01/13/2015  . Bilateral hip pain 11/23/2014  . Diabetes mellitus ([place]) 10/25/2014  . Intertrigo 10/25/2014  . GERD (gastroesophageal reflux disease) 10/25/2014  . Diabetic neuropathy ([place]) 10/25/2014  . Chronic low back pain 10/25/2014  . Tobacco abuse 11/11/2013  . CKD (chronic kidney disease), stage III 09/02/2013  . Essential hypertension 09/02/2013  . Gout 12/05/2012    Past Surgical History:  Procedure Laterality Date  . DILATION AND CURETTAGE OF UTERUS    . EYE SURGERY Left    removed cataract w/ lens  . TUBAL LIGATION       OB History   No obstetric history on file.     Family History  Problem Relation Age of Onset  . Arthritis Mother   . Hypertension Mother   . Heart failure Mother   . Emphysema Mother   . Arthritis Maternal Grandmother   . Hypertension Maternal Grandmother   . Stroke Maternal  Grandfather   . Cancer Sister        unknown type cancer  . Heart failure Sister   . Lung disease Sister   . Heart failure Sister   . Lupus Child   . Colon cancer Neg Hx   . Esophageal cancer Neg Hx   . Stomach cancer Neg Hx   . Rectal cancer Neg Hx     Social History   Tobacco Use  . Smoking status: Current Every Day Smoker    Packs/day: 0.50    Years: 39.00    Pack years: 19.50    Types: Cigarettes  . Smokeless tobacco: Never Used  . Tobacco comment: 1 pack q 3-4 days  Substance Use Topics  . Alcohol use: No     Alcohol/week: 0.0 standard drinks  . Drug use: Not Currently    Types: Marijuana    Home Medications Prior to Admission medications   Medication Sig Start Date End Date Taking? Authorizing Provider  albuterol (PROVENTIL HFA;VENTOLIN HFA) 108 (90 Base) MCG/ACT inhaler Inhale 2 puffs into the lungs every 6 (six) hours as needed for wheezing or shortness of breath. 04/14/18  Yes Bonnell Public, MD  atorvastatin (LIPITOR) 40 MG tablet Take 40 mg by mouth daily.   Yes [provider]  cyclobenzaprine (FLEXERIL) 5 MG tablet Take 5 mg by mouth at bedtime as needed for muscle pain. 06/22/19  Yes [provider]  diphenhydramine-acetaminophen (TYLENOL PM) 25-500 MG TABS tablet Take 2 tablets by mouth at bedtime as needed (for pain).   Yes [provider]  doxazosin (CARDURA) 4 MG tablet Take 1 tablet (4 mg total) by mouth 2 (two) times daily. Patient taking differently: Take 4 mg by mouth at bedtime.  06/30/19 07/30/19 Yes Pahwani, Einar Grad, MD  ergocalciferol (VITAMIN D2) 1.25 MG (50000 UT) capsule Take 50,000 Units by mouth every Friday.    Yes [provider]  ferrous sulfate 325 (65 FE) MG tablet Take 325 mg by mouth daily with breakfast.   Yes [provider]  fexofenadine (ALLEGRA) 180 MG tablet Take 180 mg by mouth daily as needed for allergies.    Yes [provider]  furosemide (LASIX) 80 MG tablet Take 1 tablet (80 mg total) by mouth 2 (two) times daily. 02/18/19  Yes Vann, Jessica U, DO  gabapentin (NEURONTIN) 300 MG capsule Take 1 capsule (300 mg total) by mouth 2 (two) times daily. Patient taking differently: Take 900 mg by mouth at bedtime.  02/18/19  Yes Vann, Jessica U, DO  glipiZIDE (GLUCOTROL XL) 10 MG 24 hr tablet Take 10 mg by mouth in the morning and at bedtime.    Yes [provider]  hydrALAZINE (APRESOLINE) 100 MG tablet Take 1 tablet (100 mg total) by mouth 3 (three) times daily. 02/18/19  Yes Eulogio Bear U, DO    hydrocortisone cream 1 % Apply topically 2 (two) times daily. Areas of itch Patient taking differently: Apply 1 application topically 2 (two) times daily as needed for itching.  09/30/18  Yes Pokhrel, Laxman, MD  isosorbide mononitrate (IMDUR) 60 MG 24 hr tablet Take 1 tablet (60 mg total) by mouth daily. 07/01/19 07/31/19 Yes Pahwani, Einar Grad, MD  linaclotide Rolan Lipa) 145 MCG CAPS capsule Take 145 mcg by mouth daily as needed (constipation).    Yes [provider]  oxyCODONE-acetaminophen (PERCOCET/ROXICET) 5-325 MG tablet Take 1 tablet by mouth every 6 (six) hours as needed for moderate pain or severe pain.    Yes [provider]  pantoprazole (PROTONIX) 40 MG tablet Take 1 tablet (40 mg total) by mouth daily. 07/14/18  Yes Skeet Latch, MD  diclofenac Sodium (VOLTAREN) 1 % GEL Apply 2 g topically 4 (four) times daily. Patient not taking: Reported on 06/17/2019 02/18/19   Geradine Girt, DO  polyethylene glycol [facility] / GLYCOLAX) packet Take 17 g by mouth daily as needed for mild constipation. Patient not taking: Reported on 07/07/2019 04/14/18   Dana Allan I, MD    Allergies    Aspirin, Allopurinol, Amlodipine, Carvedilol, Clonidine derivatives, Lisinopril, Other, and Peanut butter flavor  Review of Systems   Review of Systems  Constitutional:       Per HPI, otherwise negative  HENT:       Per HPI, otherwise negative  Respiratory:       Per HPI, otherwise negative  Cardiovascular:       Per HPI, otherwise negative  Gastrointestinal: Negative for vomiting.  Endocrine:       Negative aside from HPI  Genitourinary:       Neg aside from HPI   Musculoskeletal:       Per HPI, otherwise negative  Skin: Negative.   Neurological: Positive for weakness. Negative for syncope.    Physical Exam Updated Vital Signs BP (!) 181/69   Pulse 75   Temp (!) 97.4 F (36.3 C) (Oral)   Resp 18   SpO2 95%   Physical Exam Vitals and nursing note reviewed.   Constitutional:      General: She is not in acute distress.    Appearance: She is well-developed.  HENT:     Head: Normocephalic and atraumatic.  Eyes:     Conjunctiva/sclera: Conjunctivae normal.  Cardiovascular:     Rate and Rhythm: Normal rate and regular rhythm.  Pulmonary:     Effort: Pulmonary effort is normal. No respiratory distress.     Breath sounds: Normal breath sounds. No stridor.  Abdominal:     General: There is no distension.  Musculoskeletal:     Comments: No gross deformities, but the patient has pain focally in the right posterior superior calf, hesitancy to move the knee secondary to this, hesitancy to ambulate secondary to this.  Skin:    General: Skin is warm and dry.  Neurological:     Mental Status: She is alert and oriented to person, place, and time.     Cranial Nerves: No cranial nerve deficit.     ED Results / Procedures / Treatments   Labs (all labs ordered are listed, but only abnormal results are displayed) Labs Reviewed - No data to display  EKG None  Radiology DG Knee Complete 4 Views Right  Result Date: 07/07/2019 CLINICAL DATA:  Right knee pain secondary to a fall at home. EXAM: RIGHT KNEE - COMPLETE 4+ VIEW COMPARISON:  Radiographs dated 03/31/2019 FINDINGS: There is a minimally displaced fracture of the proximal fibular shaft just below the fibular head. Knee joint effusion. Moderate tricompartmental osteoarthritis, stable. Calcified loose bodies in the joint. Vascular calcifications around the knee. IMPRESSION: 1. Minimally displaced fracture of the proximal fibular shaft. 2. Knee joint effusion. 3. Tricompartmental osteoarthritis. Electronically Signed   By: Lorriane Shire M.D.   On: 07/07/2019 13:30   DG Hip Unilat  With Pelvis 2-3 Views Right  Result Date: 07/07/2019 CLINICAL DATA:  Right hip pain secondary to a fall. EXAM: DG HIP (WITH OR WITHOUT PELVIS) 2-3V RIGHT COMPARISON:  Radiographs dated 03/31/2019 FINDINGS: There is no hip  fracture  or dislocation. Severe arthritis of the right hip joint, essentially unchanged. There is similar severe arthritis of the left hip. No acute abnormalities. IMPRESSION: No acute abnormality. 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 Electronically Signed   By: Lorriane Shire M.D.   On: 07/07/2019 13:28    Procedures Procedures (including critical care time)  Medications Ordered in ED Medications  oxyCODONE-acetaminophen (PERCOCET/ROXICET) 5-325 MG per tablet 1 tablet (has no administration in time range)    ED Course  I have reviewed the triage vital signs and the nursing notes.  Pertinent labs & imaging results that were available during my care of the patient were reviewed by me and considered in my medical decision making (see chart for details).   Update:, Patient ambulatory after CAM Gilford Rile is applied, using walker for assistance. MDM Rules/Calculators/A&P Elderly female with multiple medical issues including chronic kidney disease, decreased ambulatory capacity, requiring walker now presents following mechanical fall.  Patient is found to have proximal fibula fracture, otherwise reassuring evaluation including normal hip x-ray.  Patient is ambulatory following immobilization, and absent other complaints, hemodynamic instability, with notation of her multiple medical issues, she still appropriate for discharge with close outpatient follow-up with orthopedics. Final Clinical Impression(s) / ED Diagnoses Final diagnoses:  Fall, initial encounter  Other closed fracture of proximal end of right fibula, initial encounter     Carmin Muskrat, MD 07/07/19 2331

## 2019-07-07 NOTE — ED Notes (Signed)
Pts husband here to see pt. Advised him of visitor policy.

## 2019-07-07 NOTE — Discharge Instructions (Addendum)
As discussed, today's evaluation has been generally reassuring.  However, you have been diagnosed with a fracture of the fibula, which is the smaller of the 2 bones in your lower leg.  Please use the immobilization device whenever you are up and about, and be sure to use your walker while doing so.  Please use Tylenol, ice for pain control and be sure to follow-up with our orthopedic colleagues.  Return here for concerning changes in your condition.

## 2019-07-07 NOTE — ED Notes (Signed)
Previous RN discharged pt to go home in [place]. Taxi driver refused to take pt because pt is not steady enough on her feet. Pt brought back into room, and PTAR to be called.

## 2019-07-14 DIAGNOSIS — G629 Polyneuropathy, unspecified: Secondary | ICD-10-CM | POA: Diagnosis not present

## 2019-07-14 DIAGNOSIS — E7800 Pure hypercholesterolemia, unspecified: Secondary | ICD-10-CM | POA: Diagnosis not present

## 2019-07-14 DIAGNOSIS — N184 Chronic kidney disease, stage 4 (severe): Secondary | ICD-10-CM | POA: Diagnosis not present

## 2019-07-14 DIAGNOSIS — I10 Essential (primary) hypertension: Secondary | ICD-10-CM | POA: Diagnosis not present

## 2019-07-14 DIAGNOSIS — I5032 Chronic diastolic (congestive) heart failure: Secondary | ICD-10-CM | POA: Diagnosis not present

## 2019-07-14 DIAGNOSIS — K219 Gastro-esophageal reflux disease without esophagitis: Secondary | ICD-10-CM | POA: Diagnosis not present

## 2019-07-14 DIAGNOSIS — M1611 Unilateral primary osteoarthritis, right hip: Secondary | ICD-10-CM | POA: Diagnosis not present

## 2019-07-14 DIAGNOSIS — E1169 Type 2 diabetes mellitus with other specified complication: Secondary | ICD-10-CM | POA: Diagnosis not present

## 2019-07-14 DIAGNOSIS — R251 Tremor, unspecified: Secondary | ICD-10-CM | POA: Diagnosis not present

## 2019-07-14 DIAGNOSIS — D649 Anemia, unspecified: Secondary | ICD-10-CM | POA: Diagnosis not present

## 2019-07-14 DIAGNOSIS — K8020 Calculus of gallbladder without cholecystitis without obstruction: Secondary | ICD-10-CM | POA: Diagnosis not present

## 2019-07-27 ENCOUNTER — Ambulatory Visit: Payer: PPO | Admitting: Cardiovascular Disease

## 2019-07-29 ENCOUNTER — Other Ambulatory Visit: Payer: Self-pay | Admitting: Cardiovascular Disease

## 2019-08-07 DIAGNOSIS — N184 Chronic kidney disease, stage 4 (severe): Secondary | ICD-10-CM | POA: Diagnosis not present

## 2019-08-13 DIAGNOSIS — I5032 Chronic diastolic (congestive) heart failure: Secondary | ICD-10-CM | POA: Diagnosis not present

## 2019-08-13 DIAGNOSIS — K219 Gastro-esophageal reflux disease without esophagitis: Secondary | ICD-10-CM | POA: Diagnosis not present

## 2019-08-13 DIAGNOSIS — I10 Essential (primary) hypertension: Secondary | ICD-10-CM | POA: Diagnosis not present

## 2019-08-13 DIAGNOSIS — D649 Anemia, unspecified: Secondary | ICD-10-CM | POA: Diagnosis not present

## 2019-08-13 DIAGNOSIS — N183 Chronic kidney disease, stage 3 unspecified: Secondary | ICD-10-CM | POA: Diagnosis not present

## 2019-08-13 DIAGNOSIS — E1169 Type 2 diabetes mellitus with other specified complication: Secondary | ICD-10-CM | POA: Diagnosis not present

## 2019-08-13 DIAGNOSIS — S92901D Unspecified fracture of right foot, subsequent encounter for fracture with routine healing: Secondary | ICD-10-CM | POA: Diagnosis not present

## 2019-08-13 DIAGNOSIS — E7800 Pure hypercholesterolemia, unspecified: Secondary | ICD-10-CM | POA: Diagnosis not present

## 2019-08-13 DIAGNOSIS — M1611 Unilateral primary osteoarthritis, right hip: Secondary | ICD-10-CM | POA: Diagnosis not present

## 2019-08-13 DIAGNOSIS — K828 Other specified diseases of gallbladder: Secondary | ICD-10-CM | POA: Diagnosis not present

## 2019-08-21 ENCOUNTER — Telehealth: Payer: Self-pay

## 2019-08-21 NOTE — Telephone Encounter (Signed)
NOTES ON FILE FROM [facility] [phone], SENT REFERRAL TO SCHEDULING

## 2019-09-04 ENCOUNTER — Inpatient Hospital Stay (HOSPITAL_COMMUNITY): Payer: PPO

## 2019-09-04 ENCOUNTER — Inpatient Hospital Stay (HOSPITAL_COMMUNITY)
Admission: EM | Admit: 2019-09-04 | Discharge: 2019-09-17 | DRG: 207 | Disposition: E | Payer: PPO | Attending: Internal Medicine | Admitting: Internal Medicine

## 2019-09-04 ENCOUNTER — Emergency Department (HOSPITAL_COMMUNITY): Payer: PPO

## 2019-09-04 ENCOUNTER — Other Ambulatory Visit: Payer: Self-pay

## 2019-09-04 DIAGNOSIS — Z8261 Family history of arthritis: Secondary | ICD-10-CM

## 2019-09-04 DIAGNOSIS — I517 Cardiomegaly: Secondary | ICD-10-CM | POA: Diagnosis not present

## 2019-09-04 DIAGNOSIS — I5033 Acute on chronic diastolic (congestive) heart failure: Secondary | ICD-10-CM | POA: Diagnosis not present

## 2019-09-04 DIAGNOSIS — Z825 Family history of asthma and other chronic lower respiratory diseases: Secondary | ICD-10-CM | POA: Diagnosis not present

## 2019-09-04 DIAGNOSIS — N179 Acute kidney failure, unspecified: Secondary | ICD-10-CM | POA: Diagnosis not present

## 2019-09-04 DIAGNOSIS — E872 Acidosis, unspecified: Secondary | ICD-10-CM

## 2019-09-04 DIAGNOSIS — E1122 Type 2 diabetes mellitus with diabetic chronic kidney disease: Secondary | ICD-10-CM | POA: Diagnosis present

## 2019-09-04 DIAGNOSIS — J811 Chronic pulmonary edema: Secondary | ICD-10-CM | POA: Diagnosis not present

## 2019-09-04 DIAGNOSIS — Z823 Family history of stroke: Secondary | ICD-10-CM | POA: Diagnosis not present

## 2019-09-04 DIAGNOSIS — M810 Age-related osteoporosis without current pathological fracture: Secondary | ICD-10-CM | POA: Diagnosis present

## 2019-09-04 DIAGNOSIS — G931 Anoxic brain damage, not elsewhere classified: Secondary | ICD-10-CM | POA: Diagnosis present

## 2019-09-04 DIAGNOSIS — R0602 Shortness of breath: Secondary | ICD-10-CM | POA: Diagnosis not present

## 2019-09-04 DIAGNOSIS — K7200 Acute and subacute hepatic failure without coma: Secondary | ICD-10-CM | POA: Diagnosis not present

## 2019-09-04 DIAGNOSIS — I130 Hypertensive heart and chronic kidney disease with heart failure and stage 1 through stage 4 chronic kidney disease, or unspecified chronic kidney disease: Secondary | ICD-10-CM | POA: Diagnosis present

## 2019-09-04 DIAGNOSIS — M1990 Unspecified osteoarthritis, unspecified site: Secondary | ICD-10-CM | POA: Diagnosis present

## 2019-09-04 DIAGNOSIS — K219 Gastro-esophageal reflux disease without esophagitis: Secondary | ICD-10-CM | POA: Diagnosis not present

## 2019-09-04 DIAGNOSIS — Z888 Allergy status to other drugs, medicaments and biological substances status: Secondary | ICD-10-CM

## 2019-09-04 DIAGNOSIS — Z91010 Allergy to peanuts: Secondary | ICD-10-CM

## 2019-09-04 DIAGNOSIS — J690 Pneumonitis due to inhalation of food and vomit: Secondary | ICD-10-CM | POA: Diagnosis not present

## 2019-09-04 DIAGNOSIS — Z515 Encounter for palliative care: Secondary | ICD-10-CM | POA: Diagnosis not present

## 2019-09-04 DIAGNOSIS — G40901 Epilepsy, unspecified, not intractable, with status epilepticus: Secondary | ICD-10-CM | POA: Diagnosis present

## 2019-09-04 DIAGNOSIS — G9340 Encephalopathy, unspecified: Secondary | ICD-10-CM | POA: Diagnosis not present

## 2019-09-04 DIAGNOSIS — Z79899 Other long term (current) drug therapy: Secondary | ICD-10-CM

## 2019-09-04 DIAGNOSIS — Z6841 Body Mass Index (BMI) 40.0 and over, adult: Secondary | ICD-10-CM

## 2019-09-04 DIAGNOSIS — E1165 Type 2 diabetes mellitus with hyperglycemia: Secondary | ICD-10-CM | POA: Diagnosis present

## 2019-09-04 DIAGNOSIS — J181 Lobar pneumonia, unspecified organism: Secondary | ICD-10-CM | POA: Diagnosis not present

## 2019-09-04 DIAGNOSIS — K449 Diaphragmatic hernia without obstruction or gangrene: Secondary | ICD-10-CM | POA: Diagnosis not present

## 2019-09-04 DIAGNOSIS — J810 Acute pulmonary edema: Secondary | ICD-10-CM | POA: Diagnosis not present

## 2019-09-04 DIAGNOSIS — Z8249 Family history of ischemic heart disease and other diseases of the circulatory system: Secondary | ICD-10-CM

## 2019-09-04 DIAGNOSIS — N184 Chronic kidney disease, stage 4 (severe): Secondary | ICD-10-CM | POA: Diagnosis present

## 2019-09-04 DIAGNOSIS — J9690 Respiratory failure, unspecified, unspecified whether with hypoxia or hypercapnia: Secondary | ICD-10-CM | POA: Diagnosis not present

## 2019-09-04 DIAGNOSIS — Z886 Allergy status to analgesic agent status: Secondary | ICD-10-CM

## 2019-09-04 DIAGNOSIS — J9601 Acute respiratory failure with hypoxia: Secondary | ICD-10-CM | POA: Diagnosis not present

## 2019-09-04 DIAGNOSIS — G4730 Sleep apnea, unspecified: Secondary | ICD-10-CM | POA: Diagnosis not present

## 2019-09-04 DIAGNOSIS — Z66 Do not resuscitate: Secondary | ICD-10-CM | POA: Diagnosis not present

## 2019-09-04 DIAGNOSIS — F17210 Nicotine dependence, cigarettes, uncomplicated: Secondary | ICD-10-CM | POA: Diagnosis present

## 2019-09-04 DIAGNOSIS — Z20822 Contact with and (suspected) exposure to covid-19: Secondary | ICD-10-CM | POA: Diagnosis not present

## 2019-09-04 DIAGNOSIS — I469 Cardiac arrest, cause unspecified: Secondary | ICD-10-CM | POA: Diagnosis not present

## 2019-09-04 DIAGNOSIS — Z03818 Encounter for observation for suspected exposure to other biological agents ruled out: Secondary | ICD-10-CM | POA: Diagnosis not present

## 2019-09-04 DIAGNOSIS — Z4682 Encounter for fitting and adjustment of non-vascular catheter: Secondary | ICD-10-CM | POA: Diagnosis not present

## 2019-09-04 DIAGNOSIS — J45909 Unspecified asthma, uncomplicated: Secondary | ICD-10-CM | POA: Diagnosis present

## 2019-09-04 DIAGNOSIS — J9811 Atelectasis: Secondary | ICD-10-CM | POA: Diagnosis not present

## 2019-09-04 DIAGNOSIS — I4510 Unspecified right bundle-branch block: Secondary | ICD-10-CM | POA: Diagnosis not present

## 2019-09-04 DIAGNOSIS — E875 Hyperkalemia: Secondary | ICD-10-CM | POA: Diagnosis not present

## 2019-09-04 DIAGNOSIS — I499 Cardiac arrhythmia, unspecified: Secondary | ICD-10-CM | POA: Diagnosis not present

## 2019-09-04 DIAGNOSIS — Z978 Presence of other specified devices: Secondary | ICD-10-CM

## 2019-09-04 DIAGNOSIS — R519 Headache, unspecified: Secondary | ICD-10-CM | POA: Diagnosis not present

## 2019-09-04 DIAGNOSIS — J90 Pleural effusion, not elsewhere classified: Secondary | ICD-10-CM | POA: Diagnosis not present

## 2019-09-04 DIAGNOSIS — Z809 Family history of malignant neoplasm, unspecified: Secondary | ICD-10-CM

## 2019-09-04 DIAGNOSIS — R404 Transient alteration of awareness: Secondary | ICD-10-CM | POA: Diagnosis not present

## 2019-09-04 DIAGNOSIS — S0990XA Unspecified injury of head, initial encounter: Secondary | ICD-10-CM | POA: Diagnosis not present

## 2019-09-04 LAB — URINALYSIS, ROUTINE W REFLEX MICROSCOPIC
Bilirubin Urine: NEGATIVE
Glucose, UA: 50 mg/dL — AB
Ketones, ur: NEGATIVE mg/dL
Leukocytes,Ua: NEGATIVE
Nitrite: NEGATIVE
Protein, ur: >=300 mg/dL — AB
Specific Gravity, Urine: 1.013 (ref 1.005–1.030)
pH: 5 (ref 5.0–8.0)

## 2019-09-04 LAB — I-STAT VENOUS BLOOD GAS, ED
Acid-base deficit: 14 mmol/L — ABNORMAL HIGH (ref 0.0–2.0)
Bicarbonate: 16.7 mmol/L — ABNORMAL LOW (ref 20.0–28.0)
Calcium, Ion: 1.1 mmol/L — ABNORMAL LOW (ref 1.15–1.40)
HCT: 32 % — ABNORMAL LOW (ref 36.0–46.0)
Hemoglobin: 10.9 g/dL — ABNORMAL LOW (ref 12.0–15.0)
O2 Saturation: 98 %
Potassium: 6.3 mmol/L (ref 3.5–5.1)
Sodium: 145 mmol/L (ref 135–145)
TCO2: 19 mmol/L — ABNORMAL LOW (ref 22–32)
pCO2, Ven: 61.8 mmHg — ABNORMAL HIGH (ref 44.0–60.0)
pH, Ven: 7.041 — CL (ref 7.250–7.430)
pO2, Ven: 143 mmHg — ABNORMAL HIGH (ref 32.0–45.0)

## 2019-09-04 LAB — BASIC METABOLIC PANEL
Anion gap: 13 (ref 5–15)
Anion gap: 12 (ref 5–15)
Anion gap: 8 (ref 5–15)
BUN: 42 mg/dL — ABNORMAL HIGH (ref 8–23)
BUN: 44 mg/dL — ABNORMAL HIGH (ref 8–23)
BUN: 45 mg/dL — ABNORMAL HIGH (ref 8–23)
CO2: 12 mmol/L — ABNORMAL LOW (ref 22–32)
CO2: 15 mmol/L — ABNORMAL LOW (ref 22–32)
CO2: 21 mmol/L — ABNORMAL LOW (ref 22–32)
Calcium: 7.9 mg/dL — ABNORMAL LOW (ref 8.9–10.3)
Calcium: 8 mg/dL — ABNORMAL LOW (ref 8.9–10.3)
Calcium: 7.9 mg/dL — ABNORMAL LOW (ref 8.9–10.3)
Chloride: 116 mmol/L — ABNORMAL HIGH (ref 98–111)
Chloride: 116 mmol/L — ABNORMAL HIGH (ref 98–111)
Chloride: 115 mmol/L — ABNORMAL HIGH (ref 98–111)
Creatinine, Ser: 3.25 mg/dL — ABNORMAL HIGH (ref 0.44–1.00)
Creatinine, Ser: 3.15 mg/dL — ABNORMAL HIGH (ref 0.44–1.00)
Creatinine, Ser: 3.15 mg/dL — ABNORMAL HIGH (ref 0.44–1.00)
GFR calc Af Amer: 16 mL/min — ABNORMAL LOW (ref >60)
GFR calc Af Amer: 17 mL/min — ABNORMAL LOW (ref >60)
GFR calc Af Amer: 17 mL/min — ABNORMAL LOW (ref >60)
GFR calc non Af Amer: 14 mL/min — ABNORMAL LOW (ref >60)
GFR calc non Af Amer: 15 mL/min — ABNORMAL LOW (ref >60)
GFR calc non Af Amer: 15 mL/min — ABNORMAL LOW (ref >60)
Glucose, Bld: 345 mg/dL — ABNORMAL HIGH (ref 70–99)
Glucose, Bld: 276 mg/dL — ABNORMAL HIGH (ref 70–99)
Glucose, Bld: 127 mg/dL — ABNORMAL HIGH (ref 70–99)
Potassium: 6.4 mmol/L (ref 3.5–5.1)
Potassium: 6.2 mmol/L — ABNORMAL HIGH (ref 3.5–5.1)
Potassium: 5 mmol/L (ref 3.5–5.1)
Sodium: 141 mmol/L (ref 135–145)
Sodium: 143 mmol/L (ref 135–145)
Sodium: 144 mmol/L (ref 135–145)

## 2019-09-04 LAB — CBC WITH DIFFERENTIAL/PLATELET
Abs Immature Granulocytes: 0 10*3/uL (ref 0.00–0.07)
Basophils Absolute: 0 10*3/uL (ref 0.0–0.1)
Basophils Relative: 0 %
Eosinophils Absolute: 0 10*3/uL (ref 0.0–0.5)
Eosinophils Relative: 0 %
HCT: 34.1 % — ABNORMAL LOW (ref 36.0–46.0)
Hemoglobin: 9.2 g/dL — ABNORMAL LOW (ref 12.0–15.0)
Lymphocytes Relative: 20 %
Lymphs Abs: 2.5 10*3/uL (ref 0.7–4.0)
MCH: 27.5 pg (ref 26.0–34.0)
MCHC: 27 g/dL — ABNORMAL LOW (ref 30.0–36.0)
MCV: 101.8 fL — ABNORMAL HIGH (ref 80.0–100.0)
Monocytes Absolute: 0.6 10*3/uL (ref 0.1–1.0)
Monocytes Relative: 5 %
Neutro Abs: 9.2 10*3/uL — ABNORMAL HIGH (ref 1.7–7.7)
Neutrophils Relative %: 75 %
Platelets: 158 10*3/uL (ref 150–400)
RBC: 3.35 MIL/uL — ABNORMAL LOW (ref 3.87–5.11)
RDW: 16.4 % — ABNORMAL HIGH (ref 11.5–15.5)
WBC: 12.3 10*3/uL — ABNORMAL HIGH (ref 4.0–10.5)
nRBC: 0.7 % — ABNORMAL HIGH (ref 0.0–0.2)
nRBC: 2 /100 WBC — ABNORMAL HIGH

## 2019-09-04 LAB — I-STAT ARTERIAL BLOOD GAS, ED
Acid-base deficit: 12 mmol/L — ABNORMAL HIGH (ref 0.0–2.0)
Acid-base deficit: 12 mmol/L — ABNORMAL HIGH (ref 0.0–2.0)
Bicarbonate: 18.3 mmol/L — ABNORMAL LOW (ref 20.0–28.0)
Bicarbonate: 17.1 mmol/L — ABNORMAL LOW (ref 20.0–28.0)
Calcium, Ion: 1.17 mmol/L (ref 1.15–1.40)
Calcium, Ion: 1.12 mmol/L — ABNORMAL LOW (ref 1.15–1.40)
HCT: 30 % — ABNORMAL LOW (ref 36.0–46.0)
HCT: 29 % — ABNORMAL LOW (ref 36.0–46.0)
Hemoglobin: 10.2 g/dL — ABNORMAL LOW (ref 12.0–15.0)
Hemoglobin: 9.9 g/dL — ABNORMAL LOW (ref 12.0–15.0)
O2 Saturation: 99 %
O2 Saturation: 100 %
Patient temperature: 91.9
Potassium: 5.9 mmol/L — ABNORMAL HIGH (ref 3.5–5.1)
Potassium: 5.8 mmol/L — ABNORMAL HIGH (ref 3.5–5.1)
Sodium: 145 mmol/L (ref 135–145)
Sodium: 145 mmol/L (ref 135–145)
TCO2: 20 mmol/L — ABNORMAL LOW (ref 22–32)
TCO2: 19 mmol/L — ABNORMAL LOW (ref 22–32)
pCO2 arterial: 60.9 mmHg — ABNORMAL HIGH (ref 32.0–48.0)
pCO2 arterial: 47.1 mmHg (ref 32.0–48.0)
pH, Arterial: 7.087 — CL (ref 7.350–7.450)
pH, Arterial: 7.146 — CL (ref 7.350–7.450)
pO2, Arterial: 197 mmHg — ABNORMAL HIGH (ref 83.0–108.0)
pO2, Arterial: 281 mmHg — ABNORMAL HIGH (ref 83.0–108.0)

## 2019-09-04 LAB — MAGNESIUM
Magnesium: 2.8 mg/dL — ABNORMAL HIGH (ref 1.7–2.4)
Magnesium: 2.5 mg/dL — ABNORMAL HIGH (ref 1.7–2.4)

## 2019-09-04 LAB — PHOSPHORUS: Phosphorus: 7.4 mg/dL — ABNORMAL HIGH (ref 2.5–4.6)

## 2019-09-04 LAB — POCT I-STAT 7, (LYTES, BLD GAS, ICA,H+H)
Acid-base deficit: 10 mmol/L — ABNORMAL HIGH (ref 0.0–2.0)
Bicarbonate: 17.2 mmol/L — ABNORMAL LOW (ref 20.0–28.0)
Calcium, Ion: 1.1 mmol/L — ABNORMAL LOW (ref 1.15–1.40)
HCT: 30 % — ABNORMAL LOW (ref 36.0–46.0)
Hemoglobin: 10.2 g/dL — ABNORMAL LOW (ref 12.0–15.0)
O2 Saturation: 92 %
Patient temperature: 33.5
Potassium: 5.7 mmol/L — ABNORMAL HIGH (ref 3.5–5.1)
Sodium: 145 mmol/L (ref 135–145)
TCO2: 18 mmol/L — ABNORMAL LOW (ref 22–32)
pCO2 arterial: 37.3 mmHg (ref 32.0–48.0)
pH, Arterial: 7.252 — ABNORMAL LOW (ref 7.350–7.450)
pO2, Arterial: 62 mmHg — ABNORMAL LOW (ref 83.0–108.0)

## 2019-09-04 LAB — CBC
HCT: 34.6 % — ABNORMAL LOW (ref 36.0–46.0)
Hemoglobin: 9.3 g/dL — ABNORMAL LOW (ref 12.0–15.0)
MCH: 27.5 pg (ref 26.0–34.0)
MCHC: 26.9 g/dL — ABNORMAL LOW (ref 30.0–36.0)
MCV: 102.4 fL — ABNORMAL HIGH (ref 80.0–100.0)
Platelets: 156 10*3/uL (ref 150–400)
RBC: 3.38 MIL/uL — ABNORMAL LOW (ref 3.87–5.11)
RDW: 16.3 % — ABNORMAL HIGH (ref 11.5–15.5)
WBC: 12.3 10*3/uL — ABNORMAL HIGH (ref 4.0–10.5)
nRBC: 1.1 % — ABNORMAL HIGH (ref 0.0–0.2)

## 2019-09-04 LAB — GLUCOSE, CAPILLARY
Glucose-Capillary: 301 mg/dL — ABNORMAL HIGH (ref 70–99)
Glucose-Capillary: 239 mg/dL — ABNORMAL HIGH (ref 70–99)
Glucose-Capillary: 224 mg/dL — ABNORMAL HIGH (ref 70–99)
Glucose-Capillary: 183 mg/dL — ABNORMAL HIGH (ref 70–99)
Glucose-Capillary: 152 mg/dL — ABNORMAL HIGH (ref 70–99)
Glucose-Capillary: 130 mg/dL — ABNORMAL HIGH (ref 70–99)
Glucose-Capillary: 132 mg/dL — ABNORMAL HIGH (ref 70–99)

## 2019-09-04 LAB — PROTIME-INR
INR: 1.7 — ABNORMAL HIGH (ref 0.8–1.2)
Prothrombin Time: 19.3 seconds — ABNORMAL HIGH (ref 11.4–15.2)

## 2019-09-04 LAB — HEPATIC FUNCTION PANEL
ALT: 627 U/L — ABNORMAL HIGH (ref 0–44)
AST: 736 U/L — ABNORMAL HIGH (ref 15–41)
Albumin: 2.4 g/dL — ABNORMAL LOW (ref 3.5–5.0)
Alkaline Phosphatase: 204 U/L — ABNORMAL HIGH (ref 38–126)
Bilirubin, Direct: 1 mg/dL — ABNORMAL HIGH (ref 0.0–0.2)
Indirect Bilirubin: 0.5 mg/dL (ref 0.3–0.9)
Total Bilirubin: 1.5 mg/dL — ABNORMAL HIGH (ref 0.3–1.2)
Total Protein: 5.6 g/dL — ABNORMAL LOW (ref 6.5–8.1)

## 2019-09-04 LAB — MRSA PCR SCREENING: MRSA by PCR: NEGATIVE

## 2019-09-04 LAB — I-STAT CHEM 8, ED
BUN: 51 mg/dL — ABNORMAL HIGH (ref 8–23)
Calcium, Ion: 1.07 mmol/L — ABNORMAL LOW (ref 1.15–1.40)
Chloride: 116 mmol/L — ABNORMAL HIGH (ref 98–111)
Creatinine, Ser: 3.1 mg/dL — ABNORMAL HIGH (ref 0.44–1.00)
Glucose, Bld: 333 mg/dL — ABNORMAL HIGH (ref 70–99)
HCT: 32 % — ABNORMAL LOW (ref 36.0–46.0)
Hemoglobin: 10.9 g/dL — ABNORMAL LOW (ref 12.0–15.0)
Potassium: 6.3 mmol/L (ref 3.5–5.1)
Sodium: 145 mmol/L (ref 135–145)
TCO2: 19 mmol/L — ABNORMAL LOW (ref 22–32)

## 2019-09-04 LAB — RAPID URINE DRUG SCREEN, HOSP PERFORMED
Amphetamines: NOT DETECTED
Barbiturates: NOT DETECTED
Benzodiazepines: NOT DETECTED
Cocaine: NOT DETECTED
Opiates: NOT DETECTED
Tetrahydrocannabinol: NOT DETECTED

## 2019-09-04 LAB — LACTIC ACID, PLASMA
Lactic Acid, Venous: 7.4 mmol/L (ref 0.5–1.9)
Lactic Acid, Venous: 5.4 mmol/L (ref 0.5–1.9)

## 2019-09-04 LAB — HEMOGLOBIN A1C
Hgb A1c MFr Bld: 6.4 % — ABNORMAL HIGH (ref 4.8–5.6)
Mean Plasma Glucose: 136.98 mg/dL

## 2019-09-04 LAB — BODY FLUID CELL COUNT WITH DIFFERENTIAL
Eos, Fluid: 1 %
Lymphs, Fluid: 2 %
Monocyte-Macrophage-Serous Fluid: 2 % — ABNORMAL LOW (ref 50–90)
Neutrophil Count, Fluid: 95 % — ABNORMAL HIGH (ref 0–25)
Total Nucleated Cell Count, Fluid: 1430 cu mm — ABNORMAL HIGH (ref 0–1000)

## 2019-09-04 LAB — TROPONIN I (HIGH SENSITIVITY)
Troponin I (High Sensitivity): 1887 ng/L (ref <18)
Troponin I (High Sensitivity): 5676 ng/L (ref <18)
Troponin I (High Sensitivity): 12006 ng/L (ref <18)

## 2019-09-04 LAB — APTT: aPTT: 34 seconds (ref 24–36)

## 2019-09-04 LAB — CK: Total CK: 479 U/L — ABNORMAL HIGH (ref 38–234)

## 2019-09-04 LAB — BRAIN NATRIURETIC PEPTIDE: B Natriuretic Peptide: 2053.4 pg/mL — ABNORMAL HIGH (ref 0.0–100.0)

## 2019-09-04 LAB — CBG MONITORING, ED: Glucose-Capillary: 331 mg/dL — ABNORMAL HIGH (ref 70–99)

## 2019-09-04 LAB — TRIGLYCERIDES: Triglycerides: 52 mg/dL (ref <150)

## 2019-09-04 LAB — SARS CORONAVIRUS 2 BY RT PCR (HOSPITAL ORDER, PERFORMED IN ~~LOC~~ HOSPITAL LAB): SARS Coronavirus 2: NEGATIVE

## 2019-09-04 MED ORDER — LORAZEPAM 2 MG/ML IJ SOLN: 2.0000 mg | Freq: Once | INTRAMUSCULAR | Status: AC

## 2019-09-04 MED ORDER — ORAL CARE MOUTH RINSE
15.0000 mL | OROMUCOSAL | Status: DC
  Administered 2019-09-04 – 2019-09-08 (×38): 15 mL via OROMUCOSAL

## 2019-09-04 MED ORDER — INSULIN ASPART 100 UNIT/ML ~~LOC~~ SOLN: 0.0000 [IU] | SUBCUTANEOUS | Status: DC

## 2019-09-04 MED ORDER — DEXTROSE 50 % IV SOLN
0.0000 mL | INTRAVENOUS | Status: DC | PRN
  Administered 2019-09-06: 25 mL via INTRAVENOUS
  Filled 2019-09-04: qty 50

## 2019-09-04 MED ORDER — CHLORHEXIDINE GLUCONATE 0.12% ORAL RINSE (MEDLINE KIT)
15.0000 mL | Freq: Two times a day (BID) | OROMUCOSAL | Status: DC
  Administered 2019-09-04 – 2019-09-08 (×8): 15 mL via OROMUCOSAL

## 2019-09-04 MED ORDER — VANCOMYCIN HCL 1500 MG/300ML IV SOLN
1500.0000 mg | INTRAVENOUS | Status: DC
  Filled 2019-09-04: qty 300

## 2019-09-04 MED ORDER — SODIUM CHLORIDE 0.9 % IV SOLN
2.0000 g | Freq: Once | INTRAVENOUS | Status: AC
  Administered 2019-09-04: 2 g via INTRAVENOUS
  Filled 2019-09-04: qty 2

## 2019-09-04 MED ORDER — FENTANYL CITRATE (PF) 100 MCG/2ML IJ SOLN: 25.0000 ug | INTRAMUSCULAR | Status: DC | PRN

## 2019-09-04 MED ORDER — LEVETIRACETAM IN NACL 500 MG/100ML IV SOLN
500.0000 mg | Freq: Two times a day (BID) | INTRAVENOUS | Status: DC
  Administered 2019-09-05 – 2019-09-08 (×7): 500 mg via INTRAVENOUS
  Filled 2019-09-04 (×7): qty 100

## 2019-09-04 MED ORDER — INSULIN REGULAR(HUMAN) IN NACL 100-0.9 UT/100ML-% IV SOLN
INTRAVENOUS | Status: DC
  Administered 2019-09-04: 7.5 [IU]/h via INTRAVENOUS
  Filled 2019-09-04: qty 100

## 2019-09-04 MED ORDER — SODIUM CHLORIDE 0.9 % IV SOLN: INTRAVENOUS | Status: DC

## 2019-09-04 MED ORDER — SODIUM CHLORIDE 0.9 % IV BOLUS
1000.0000 mL | Freq: Once | INTRAVENOUS | Status: AC
  Administered 2019-09-04: 1000 mL via INTRAVENOUS

## 2019-09-04 MED ORDER — PIPERACILLIN-TAZOBACTAM 3.375 G IVPB
3.3750 g | Freq: Three times a day (TID) | INTRAVENOUS | Status: DC
  Administered 2019-09-04 – 2019-09-08 (×12): 3.375 g via INTRAVENOUS
  Filled 2019-09-04 (×11): qty 50

## 2019-09-04 MED ORDER — INSULIN ASPART 100 UNIT/ML ~~LOC~~ SOLN: 10.0000 [IU] | Freq: Once | SUBCUTANEOUS | Status: DC

## 2019-09-04 MED ORDER — FENTANYL 2500MCG IN NS 250ML (10MCG/ML) PREMIX INFUSION: 0.0000 ug/h | INTRAVENOUS | Status: DC

## 2019-09-04 MED ORDER — CHLORHEXIDINE GLUCONATE CLOTH 2 % EX PADS
6.0000 | MEDICATED_PAD | Freq: Every day | CUTANEOUS | Status: DC
  Administered 2019-09-05 – 2019-09-08 (×4): 6 via TOPICAL

## 2019-09-04 MED ORDER — PANTOPRAZOLE SODIUM 40 MG IV SOLR
40.0000 mg | Freq: Every day | INTRAVENOUS | Status: DC
  Administered 2019-09-04 – 2019-09-07 (×4): 40 mg via INTRAVENOUS
  Filled 2019-09-04 (×4): qty 40

## 2019-09-04 MED ORDER — LEVETIRACETAM IN NACL 500 MG/100ML IV SOLN: 500.0000 mg | Freq: Two times a day (BID) | INTRAVENOUS | Status: DC

## 2019-09-04 MED ORDER — NOREPINEPHRINE 4 MG/250ML-% IV SOLN
INTRAVENOUS | Status: AC
  Filled 2019-09-04: qty 250

## 2019-09-04 MED ORDER — LEVETIRACETAM IN NACL 1000 MG/100ML IV SOLN
1000.0000 mg | Freq: Once | INTRAVENOUS | Status: AC
  Administered 2019-09-04: 1000 mg via INTRAVENOUS
  Filled 2019-09-04: qty 100

## 2019-09-04 MED ORDER — CALCIUM GLUCONATE-NACL 1-0.675 GM/50ML-% IV SOLN
1.0000 g | Freq: Once | INTRAVENOUS | Status: AC
  Administered 2019-09-04: 1000 mg via INTRAVENOUS
  Filled 2019-09-04: qty 50

## 2019-09-04 MED ORDER — LORAZEPAM 2 MG/ML IJ SOLN
1.0000 mg | INTRAMUSCULAR | Status: DC | PRN
  Administered 2019-09-05: 1 mg via INTRAVENOUS
  Filled 2019-09-04: qty 1

## 2019-09-04 MED ORDER — SODIUM CHLORIDE 0.9 % IV SOLN: 2.0000 g | INTRAVENOUS | Status: DC

## 2019-09-04 MED ORDER — HYDRALAZINE HCL 20 MG/ML IJ SOLN
10.0000 mg | INTRAMUSCULAR | Status: DC | PRN
  Administered 2019-09-04 – 2019-09-08 (×15): 10 mg via INTRAVENOUS
  Filled 2019-09-04 (×15): qty 1

## 2019-09-04 MED ORDER — DEXTROSE 50 % IV SOLN: 1.0000 | Freq: Once | INTRAVENOUS | Status: DC

## 2019-09-04 MED ORDER — PROPOFOL 1000 MG/100ML IV EMUL
5.0000 ug/kg/min | INTRAVENOUS | Status: DC
  Administered 2019-09-04: 10 ug/kg/min via INTRAVENOUS
  Administered 2019-09-05 (×2): 25 ug/kg/min via INTRAVENOUS
  Administered 2019-09-05 (×2): 15 ug/kg/min via INTRAVENOUS
  Administered 2019-09-06 (×2): 25 ug/kg/min via INTRAVENOUS
  Administered 2019-09-06: 30 ug/kg/min via INTRAVENOUS
  Administered 2019-09-06: 25 ug/kg/min via INTRAVENOUS
  Administered 2019-09-07: 20 ug/kg/min via INTRAVENOUS
  Administered 2019-09-07: 35 ug/kg/min via INTRAVENOUS
  Administered 2019-09-07: 10 ug/kg/min via INTRAVENOUS
  Administered 2019-09-07: 25 ug/kg/min via INTRAVENOUS
  Administered 2019-09-08: 10 ug/kg/min via INTRAVENOUS
  Filled 2019-09-04 (×15): qty 100

## 2019-09-04 MED ORDER — PIPERACILLIN-TAZOBACTAM 3.375 G IVPB 30 MIN
3.3750 g | Freq: Once | INTRAVENOUS | Status: DC
  Filled 2019-09-04: qty 50

## 2019-09-04 MED ORDER — FENTANYL BOLUS VIA INFUSION
25.0000 ug | INTRAVENOUS | Status: DC | PRN
  Filled 2019-09-04: qty 25

## 2019-09-04 MED ORDER — LORAZEPAM 2 MG/ML IJ SOLN
INTRAMUSCULAR | Status: AC
  Administered 2019-09-04: 2 mg via INTRAVENOUS
  Filled 2019-09-04: qty 1

## 2019-09-04 MED ORDER — DOCUSATE SODIUM 100 MG PO CAPS: 100.0000 mg | ORAL_CAPSULE | Freq: Two times a day (BID) | ORAL | Status: DC | PRN

## 2019-09-04 MED ORDER — POLYETHYLENE GLYCOL 3350 17 G PO PACK: 17.0000 g | PACK | Freq: Every day | ORAL | Status: DC | PRN

## 2019-09-04 MED ORDER — SODIUM ZIRCONIUM CYCLOSILICATE 10 G PO PACK
10.0000 g | PACK | Freq: Three times a day (TID) | ORAL | Status: DC
  Administered 2019-09-04: 10 g via ORAL
  Filled 2019-09-04: qty 1

## 2019-09-04 MED ORDER — SODIUM CHLORIDE 0.9 % IV SOLN: INTRAVENOUS | Status: DC | PRN

## 2019-09-04 MED ORDER — NOREPINEPHRINE 4 MG/250ML-% IV SOLN
0.0000 ug/min | INTRAVENOUS | Status: DC
  Administered 2019-09-04 (×2): 2 ug/min via INTRAVENOUS

## 2019-09-04 MED ORDER — FENTANYL 2500MCG IN NS 250ML (10MCG/ML) PREMIX INFUSION
25.0000 ug/h | INTRAVENOUS | Status: DC
  Filled 2019-09-04: qty 250

## 2019-09-04 MED ORDER — SODIUM ZIRCONIUM CYCLOSILICATE 10 G PO PACK
10.0000 g | PACK | Freq: Three times a day (TID) | ORAL | Status: AC
  Administered 2019-09-04 – 2019-09-06 (×5): 10 g
  Filled 2019-09-04 (×5): qty 1

## 2019-09-04 MED ORDER — DEXTROSE-NACL 5-0.45 % IV SOLN: INTRAVENOUS | Status: DC

## 2019-09-04 MED ORDER — SODIUM BICARBONATE 8.4 % IV SOLN
INTRAVENOUS | Status: AC | PRN
  Administered 2019-09-04 (×2): 50 meq via INTRAVENOUS

## 2019-09-04 MED ORDER — INSULIN ASPART 100 UNIT/ML ~~LOC~~ SOLN
5.0000 [IU] | Freq: Once | SUBCUTANEOUS | Status: AC
  Administered 2019-09-04: 5 [IU] via INTRAVENOUS

## 2019-09-04 MED ORDER — STERILE WATER FOR INJECTION IV SOLN
INTRAVENOUS | Status: DC
  Filled 2019-09-04 (×10): qty 850

## 2019-09-04 MED ORDER — HEPARIN SODIUM (PORCINE) 5000 UNIT/ML IJ SOLN
5000.0000 [IU] | Freq: Three times a day (TID) | INTRAMUSCULAR | Status: DC
  Administered 2019-09-04 – 2019-09-08 (×12): 5000 [IU] via SUBCUTANEOUS
  Filled 2019-09-04 (×12): qty 1

## 2019-09-04 MED ORDER — VANCOMYCIN HCL 2000 MG/400ML IV SOLN
2000.0000 mg | Freq: Once | INTRAVENOUS | Status: AC
  Administered 2019-09-04: 2000 mg via INTRAVENOUS
  Filled 2019-09-04: qty 400

## 2019-09-04 MED ORDER — ASPIRIN 300 MG RE SUPP
300.0000 mg | RECTAL | Status: AC
  Administered 2019-09-04: 300 mg via RECTAL
  Filled 2019-09-04: qty 1

## 2019-09-04 MED FILL — Medication: Qty: 1 | Status: AC

## 2019-09-04 NOTE — Progress Notes (Signed)
EEG completed, results pending. 

## 2019-09-04 NOTE — Procedures (Signed)
Arterial Catheter Insertion Procedure Note Anusha Claus [id number] October 26, 1950  Procedure: Insertion of Arterial Catheter  Indications: Blood pressure monitoring and Frequent blood sampling  Procedure Details Consent: Unable to obtain consent because of patient is unresponsive on the ventilator. Time Out: Verified patient identification, verified procedure, site/side was marked, verified correct patient position, special equipment/implants available, medications/allergies/relevent history reviewed, required imaging and test results available.  Performed  Maximum sterile technique was used including antiseptics, cap, gloves, gown, hand hygiene, mask and sheet. Skin prep: Chlorhexidine; local anesthetic administered 20 gauge catheter was inserted into right radial artery using the Seldinger technique. ULTRASOUND GUIDANCE USED: NO Evaluation Blood flow good; BP tracing good. Complications: No apparent complications.   Kathie Dike 08/24/2019

## 2019-09-04 NOTE — Progress Notes (Signed)
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  At that time order placed to begin antielliptic medication, Keppra and a neurology consult was placed.  On arrival to bedside patient seen with frequent brief eye opening and axial jerks as described by epileptologist.  Order placed for benzodiazepine administration, nurse updated.  We will also begin propofol infusion.  Both spouse and son called regarding EEG results.  Both updated that myoclonic seizures post cardiac arrest is often a poor diagnostic prognosticator.  Both indicated they would like to continue aggressive measures tonight with reevaluation tomorrow morning.  Family would be amendable to transition of comfort measures if no meaningful neurological recovery is seen.   Johnsie Cancel, NP-C [facility] Contact / Pager information can be found on Amion  09/06/2019, 6:41 PM

## 2019-09-04 NOTE — ED Triage Notes (Signed)
Pt BIB [facility] from home. Pt was not feeling well last night but refused to come to hospital for evaluation. Pt husband found pt unresponsive this morning and called EMS. CPR initiated @ 11:11 via Fire. Pulses were regained by EMS @ 11:35. Pulses then lost again @ 11:45. Pt received EPI x2 via EMS.

## 2019-09-04 NOTE — Progress Notes (Signed)
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  eICU Interventions  Will defer assessment of patient's anisocoria , and any recommendations to neurology at this time.        Frederik Pear 09/07/2019, 9:34 PM

## 2019-09-04 NOTE — Discharge Planning (Signed)
EDCM to follow for disposition needs.  

## 2019-09-04 NOTE — Code Documentation (Signed)
Pulses back

## 2019-09-04 NOTE — ED Provider Notes (Signed)
[facility] Provider Note   CSN: [id number] Arrival date & time: 09/03/2019  1200     History Chief Complaint  Patient presents with  . Post CPR    Zoe Clarke is a 69 y.o. female.  HPI      69yo female with history of CHF, htn, DM, and CKD who presents with concern for cardiac arrest.  Per husband she has been feeling unwell over the last month but symptoms worse in the last few days and last night. Her legs have had increasing swelling over the last month and she appeared to have some dyspnea but not significant worsenig of that over last few days. She had normal conversations with her niece yesterday.  Husband reports her glucose has been high in the 500s at home.  Denies cough, fevers, vomiting, diarrhea, bloody stool, urinary symptoms. He attempted to convince her to come to the ED but she did not want to come.  This morning he got up to shower and she was sleeping, breathing, and he then checked on her again.  He reported to me he then went to check on her and she was not breathing. "She was gone." and EMS was called.  CPR was initiated by the Fire Dept at 11:11AM and had ROSC with EMS at 11:35AM after asystole and PEA arrest.  She lost pulses again at 1145AM. Received epinephrine with EMS and king airway placed.  Her glucose was in the 500s.     Past Medical History:  Diagnosis Date  . Allergy   . Arthritis   . Asthma    weazing at night uses inhalers  . Bronchitis    history of  . Chronic diastolic CHF (congestive heart failure) ([place]) 04/2018  . CKD stage 4 due to type 2 diabetes mellitus ([place])   . Diabetes mellitus without complication ([place])   . Gastric ulcer   . GERD (gastroesophageal reflux disease)   . History of hiatal hernia   . Hypertension   . Osteoporosis   . Sleep apnea 09/22/2018    Patient Active Problem List   Diagnosis Date Noted  . Cardiac arrest ([place]) 08/26/2019  . CHF (congestive heart failure) ([place]) 06/25/2019  . CKD  (chronic kidney disease) stage 4, GFR 15-29 ml/min (HCC)   . Anemia due to stage 4 chronic kidney disease ([place])   . Acute on chronic congestive heart failure ([place]) 02/13/2019  . Hyperkalemia 02/13/2019  . Elevated liver enzymes 02/13/2019  . Atelectasis 12/22/2018  . CAP (community acquired pneumonia) 12/20/2018  . Obesity, Class III, BMI 40-49.9 (morbid obesity) ([place]) 12/20/2018  . Marijuana abuse 12/20/2018  . Dysuria 12/20/2018  . Acute on chronic diastolic heart failure ([place]) 11/06/2018  . 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 Thyroid nodule 09/22/2018  . Anemia, chronic renal failure, stage 3 (moderate) 09/22/2018  . Sleep apnea 09/22/2018  . Precordial chest pain   . Cholelithiasis 07/30/2018  . Acute on chronic combined systolic (congestive) and diastolic (congestive) heart failure ([place]) 07/29/2018  . Dyspnea 07/29/2018  . Tobacco abuse counseling 04/27/2018  . Acute pyelonephritis 04/10/2018  . Hypertensive urgency 04/10/2018  . Leukocytosis 09/10/2017  . Anemia of chronic disease 09/10/2017  . Knee pain, bilateral 01/13/2015  . Bilateral hip pain 11/23/2014  . Diabetes mellitus ([place]) 10/25/2014  . Intertrigo 10/25/2014  . GERD (gastroesophageal reflux disease) 10/25/2014  . Diabetic  neuropathy ([place]) 10/25/2014  . Chronic low back pain 10/25/2014  . Tobacco abuse 11/11/2013  . CKD (chronic kidney disease), stage III 09/02/2013  . Essential hypertension 09/02/2013  . Gout 12/05/2012    Past Surgical History:  Procedure Laterality Date  . DILATION AND CURETTAGE OF UTERUS    . EYE SURGERY Left    removed cataract w/ lens  . TUBAL LIGATION       OB History   No obstetric history on file.     Family History  Problem Relation Age of Onset  . Arthritis Mother   . Hypertension Mother   . Heart failure Mother   . Emphysema Mother   .  Arthritis Maternal Grandmother   . Hypertension Maternal Grandmother   . Stroke Maternal Grandfather   . Cancer Sister        unknown type cancer  . Heart failure Sister   . Lung disease Sister   . Heart failure Sister   . Lupus Child   . Colon cancer Neg Hx   . Esophageal cancer Neg Hx   . Stomach cancer Neg Hx   . Rectal cancer Neg Hx     Social History   Tobacco Use  . Smoking status: Current Every Day Smoker    Packs/day: 0.50    Years: 39.00    Pack years: 19.50    Types: Cigarettes  . Smokeless tobacco: Never Used  . Tobacco comment: 1 pack q 3-4 days  Vaping Use  . Vaping Use: Never used  Substance Use Topics  . Alcohol use: No    Alcohol/week: 0.0 standard drinks  . Drug use: Not Currently    Types: Marijuana    Home Medications Prior to Admission medications   Medication Sig Start Date End Date Taking? Authorizing Provider  albuterol (PROVENTIL HFA;VENTOLIN HFA) 108 (90 Base) MCG/ACT inhaler Inhale 2 puffs into the lungs every 6 (six) hours as needed for wheezing or shortness of breath. 04/14/18   Bonnell Public, MD  atorvastatin (LIPITOR) 40 MG tablet Take 40 mg by mouth daily.    [provider]  cyclobenzaprine (FLEXERIL) 5 MG tablet Take 5 mg by mouth at bedtime as needed for muscle pain. 06/22/19   [provider]  diclofenac Sodium (VOLTAREN) 1 % GEL Apply 2 g topically 4 (four) times daily. Patient not taking: Reported on 06/17/2019 02/18/19   Geradine Girt, DO  diphenhydramine-acetaminophen (TYLENOL PM) 25-500 MG TABS tablet Take 2 tablets by mouth at bedtime as needed (for pain).    [provider]  doxazosin (CARDURA) 4 MG tablet Take 1 tablet (4 mg total) by mouth 2 (two) times daily. Patient taking differently: Take 4 mg by mouth at bedtime.  06/30/19 09/15/2019  Darliss Cheney, MD  ergocalciferol (VITAMIN D2) 1.25 MG (50000 UT) capsule Take 50,000 Units by mouth every Friday.     [provider]  ferrous sulfate 325  (65 FE) MG tablet Take 325 mg by mouth daily with breakfast.    [provider]  fexofenadine (ALLEGRA) 180 MG tablet Take 180 mg by mouth daily as needed for allergies.     [provider]  furosemide (LASIX) 80 MG tablet Take 1 tablet (80 mg total) by mouth 2 (two) times daily. 02/18/19   Geradine Girt, DO  gabapentin (NEURONTIN) 300 MG capsule Take 1 capsule (300 mg total) by mouth 2 (two) times daily. Patient taking differently: Take 900 mg by mouth at bedtime.  02/18/19  Eulogio Bear U, DO  glipiZIDE (GLUCOTROL XL) 10 MG 24 hr tablet Take 10 mg by mouth in the morning and at bedtime.     [provider]  hydrALAZINE (APRESOLINE) 100 MG tablet Take 1 tablet (100 mg total) by mouth 3 (three) times daily. 02/18/19   Geradine Girt, DO  hydrocortisone cream 1 % Apply topically 2 (two) times daily. Areas of itch Patient taking differently: Apply 1 application topically 2 (two) times daily as needed for itching.  09/30/18   Pokhrel, Corrie Mckusick, MD  isosorbide mononitrate (IMDUR) 60 MG 24 hr tablet Take 1 tablet (60 mg total) by mouth daily. 07/01/19 07/31/19  Darliss Cheney, MD  linaclotide (LINZESS) 145 MCG CAPS capsule Take 145 mcg by mouth daily as needed (constipation).     [provider]  oxyCODONE-acetaminophen (PERCOCET/ROXICET) 5-325 MG tablet Take 1 tablet by mouth every 6 (six) hours as needed for moderate pain or severe pain.     [provider]  pantoprazole (PROTONIX) 40 MG tablet Take 1 tablet (40 mg total) by mouth daily. 07/29/19   Skeet Latch, MD  polyethylene glycol [facility] / [name] packet Take 17 g by mouth daily as needed for mild constipation. 04/14/18   Bonnell Public, MD    Allergies    Aspirin, Allopurinol, Amlodipine, Carvedilol, Clonidine derivatives, Lisinopril, Other, and Peanut butter flavor  Review of Systems   Review of Systems  Unable to perform ROS: Patient unresponsive    Physical Exam Updated Vital  Signs BP (!) 181/76   Pulse 74   Temp (!) 96.1 F (35.6 C)   Resp (!) 32   Ht '5\' 8"'$  (1.727 m)   Wt 104.3 kg   SpO2 100%   BMI 34.97 kg/m   Physical Exam Vitals and nursing note reviewed.  Constitutional:      General: She is not in acute distress.    Appearance: She is well-developed. She is ill-appearing and toxic-appearing. She is not diaphoretic.  HENT:     Head: Normocephalic and atraumatic.  Eyes:     Conjunctiva/sclera: Conjunctivae normal.  Cardiovascular:     Rate and Rhythm: Normal rate and regular rhythm.     Heart sounds: Normal heart sounds. No murmur heard.  No friction rub. No gallop.   Pulmonary:     Effort: No respiratory distress.     Breath sounds: Rhonchi present. No wheezing or rales.     Comments: No effort prior to intubation, King airway in place Abdominal:     General: There is no distension.     Palpations: Abdomen is soft.     Tenderness: There is no abdominal tenderness. There is no guarding.  Musculoskeletal:        General: No tenderness.     Cervical back: Normal range of motion.  Skin:    General: Skin is warm and dry.     Findings: No erythema or rash.  Neurological:     GCS: GCS eye subscore is 1. GCS verbal subscore is 1. GCS motor subscore is 1.     ED Results / Procedures / Treatments   Labs (all labs ordered are listed, but only abnormal results are displayed) Labs Reviewed  BASIC METABOLIC PANEL - Abnormal; Notable for the following components:      Result Value   Potassium 6.4 (*)    Chloride 116 (*)    CO2 12 (*)    Glucose, Bld 345 (*)    BUN 42 (*)  Creatinine, Ser 3.25 (*)    Calcium 7.9 (*)    GFR calc non Af Amer 14 (*)    GFR calc Af Amer 16 (*)    All other components within normal limits  CBC - Abnormal; Notable for the following components:   WBC 12.3 (*)    RBC 3.38 (*)    Hemoglobin 9.3 (*)    HCT 34.6 (*)    MCV 102.4 (*)    MCHC 26.9 (*)    RDW 16.3 (*)    nRBC 1.1 (*)    All other components  within normal limits  LACTIC ACID, PLASMA - Abnormal; Notable for the following components:   Lactic Acid, Venous 7.4 (*)    All other components within normal limits  MAGNESIUM - Abnormal; Notable for the following components:   Magnesium 2.8 (*)    All other components within normal limits  HEPATIC FUNCTION PANEL - Abnormal; Notable for the following components:   Total Protein 5.6 (*)    Albumin 2.4 (*)    AST 736 (*)    ALT 627 (*)    Alkaline Phosphatase 204 (*)    Total Bilirubin 1.5 (*)    Bilirubin, Direct 1.0 (*)    All other components within normal limits  MAGNESIUM - Abnormal; Notable for the following components:   Magnesium 2.5 (*)    All other components within normal limits  PHOSPHORUS - Abnormal; Notable for the following components:   Phosphorus 7.4 (*)    All other components within normal limits  URINALYSIS, ROUTINE W REFLEX MICROSCOPIC - Abnormal; Notable for the following components:   APPearance HAZY (*)    Glucose, UA 50 (*)    Hgb urine dipstick MODERATE (*)    Protein, ur >=300 (*)    Bacteria, UA RARE (*)    All other components within normal limits  BRAIN NATRIURETIC PEPTIDE - Abnormal; Notable for the following components:   B Natriuretic Peptide 2,053.4 (*)    All other components within normal limits  CBC WITH DIFFERENTIAL/PLATELET - Abnormal; Notable for the following components:   WBC 12.3 (*)    RBC 3.35 (*)    Hemoglobin 9.2 (*)    HCT 34.1 (*)    MCV 101.8 (*)    MCHC 27.0 (*)    RDW 16.4 (*)    nRBC 0.7 (*)    Neutro Abs 9.2 (*)    nRBC 2 (*)    All other components within normal limits  LACTIC ACID, PLASMA - Abnormal; Notable for the following components:   Lactic Acid, Venous 5.4 (*)    All other components within normal limits  BASIC METABOLIC PANEL - Abnormal; Notable for the following components:   Potassium 6.2 (*)    Chloride 116 (*)    CO2 15 (*)    Glucose, Bld 276 (*)    BUN 44 (*)    Creatinine, Ser 3.15 (*)     Calcium 8.0 (*)    GFR calc non Af Amer 15 (*)    GFR calc Af Amer 17 (*)    All other components within normal limits  PROTIME-INR - Abnormal; Notable for the following components:   Prothrombin Time 19.3 (*)    INR 1.7 (*)    All other components within normal limits  BODY FLUID CELL COUNT WITH DIFFERENTIAL - Abnormal; Notable for the following components:   Color, Fluid PINK (*)    Appearance, Fluid HAZY (*)    Total Nucleated  Cell Count, Fluid 1,430 (*)    Neutrophil Count, Fluid 95 (*)    Monocyte-Macrophage-Serous Fluid 2 (*)    All other components within normal limits  HEMOGLOBIN A1C - Abnormal; Notable for the following components:   Hgb A1c MFr Bld 6.4 (*)    All other components within normal limits  GLUCOSE, CAPILLARY - Abnormal; Notable for the following components:   Glucose-Capillary 301 (*)    All other components within normal limits  GLUCOSE, CAPILLARY - Abnormal; Notable for the following components:   Glucose-Capillary 239 (*)    All other components within normal limits  CK - Abnormal; Notable for the following components:   Total CK 479 (*)    All other components within normal limits  GLUCOSE, CAPILLARY - Abnormal; Notable for the following components:   Glucose-Capillary 224 (*)    All other components within normal limits  GLUCOSE, CAPILLARY - Abnormal; Notable for the following components:   Glucose-Capillary 183 (*)    All other components within normal limits  I-STAT VENOUS BLOOD GAS, ED - Abnormal; Notable for the following components:   pH, Ven 7.041 (*)    pCO2, Ven 61.8 (*)    pO2, Ven 143.0 (*)    Bicarbonate 16.7 (*)    TCO2 19 (*)    Acid-base deficit 14.0 (*)    Potassium 6.3 (*)    Calcium, Ion 1.10 (*)    HCT 32.0 (*)    Hemoglobin 10.9 (*)    All other components within normal limits  I-STAT CHEM 8, ED - Abnormal; Notable for the following components:   Potassium 6.3 (*)    Chloride 116 (*)    BUN 51 (*)    Creatinine, Ser 3.10  (*)    Glucose, Bld 333 (*)    Calcium, Ion 1.07 (*)    TCO2 19 (*)    Hemoglobin 10.9 (*)    HCT 32.0 (*)    All other components within normal limits  CBG MONITORING, ED - Abnormal; Notable for the following components:   Glucose-Capillary 331 (*)    All other components within normal limits  I-STAT ARTERIAL BLOOD GAS, ED - Abnormal; Notable for the following components:   pH, Arterial 7.087 (*)    pCO2 arterial 60.9 (*)    pO2, Arterial 197 (*)    Bicarbonate 18.3 (*)    TCO2 20 (*)    Acid-base deficit 12.0 (*)    Potassium 5.9 (*)    HCT 30.0 (*)    Hemoglobin 10.2 (*)    All other components within normal limits  I-STAT ARTERIAL BLOOD GAS, ED - Abnormal; Notable for the following components:   pH, Arterial 7.146 (*)    pO2, Arterial 281 (*)    Bicarbonate 17.1 (*)    TCO2 19 (*)    Acid-base deficit 12.0 (*)    Potassium 5.8 (*)    Calcium, Ion 1.12 (*)    HCT 29.0 (*)    Hemoglobin 9.9 (*)    All other components within normal limits  POCT I-STAT 7, (LYTES, BLD GAS, ICA,H+H) - Abnormal; Notable for the following components:   pH, Arterial 7.252 (*)    pO2, Arterial 62 (*)    Bicarbonate 17.2 (*)    TCO2 18 (*)    Acid-base deficit 10.0 (*)    Potassium 5.7 (*)    Calcium, Ion 1.10 (*)    HCT 30.0 (*)    Hemoglobin 10.2 (*)    All other  components within normal limits  TROPONIN I (HIGH SENSITIVITY) - Abnormal; Notable for the following components:   Troponin I (High Sensitivity) 1,887 (*)    All other components within normal limits  TROPONIN I (HIGH SENSITIVITY) - Abnormal; Notable for the following components:   Troponin I (High Sensitivity) 5,676 (*)    All other components within normal limits  TROPONIN I (HIGH SENSITIVITY) - Abnormal; Notable for the following components:   Troponin I (High Sensitivity) 12,006 (*)    All other components within normal limits  SARS CORONAVIRUS 2 BY RT PCR (HOSPITAL ORDER, [facility])  MRSA PCR  SCREENING  CULTURE, RESPIRATORY  CULTURE, BLOOD (ROUTINE X 2)  CULTURE, BLOOD (ROUTINE X 2)  URINE CULTURE  APTT  RAPID URINE DRUG SCREEN, HOSP PERFORMED  TRIGLYCERIDES  BLOOD GAS, ARTERIAL  BLOOD GAS, ARTERIAL  MISC [facility] TEST (SEND OUT)  BASIC METABOLIC PANEL  BASIC METABOLIC PANEL    EKG EKG Interpretation  Date/Time:  Friday September 04 2019 14:42:39 EDT Ventricular Rate:  64 PR Interval:    QRS Duration: 92 QT Interval:  484 QTC Calculation: 500 R Axis:   65 Text Interpretation: Sinus rhythm Borderline prolonged QT interval ST changes anterior leads improved, no other significant changes Confirmed by Gareth Morgan [phone]) on 08/25/2019 8:46:18 PM   Radiology CT Head Wo Contrast  Result Date: 09/14/2019 CLINICAL DATA:  Found unresponsive. EXAM: CT HEAD WITHOUT CONTRAST TECHNIQUE: Contiguous axial images were obtained from the base of the skull through the vertex without intravenous contrast. COMPARISON:  November 03, 2015 FINDINGS: Brain: There is mild cerebral atrophy with widening of the extra-axial spaces and ventricular dilatation. There are areas of decreased attenuation within the white matter tracts of the supratentorial brain, consistent with microvascular disease changes. A small area of cortical and white matter low attenuation is seen within the parasagittal region of the left occipital lobe (axial CT image 19, CT series number 3). This is present on the prior study. Vascular: No hyperdense vessel or unexpected calcification. Skull: Normal. Negative for fracture or focal lesion. Sinuses/Orbits: There is mild bilateral ethmoid sinus mucosal thickening. Other: None. IMPRESSION: 1. Generalized cerebral atrophy. 2. Small, chronic left occipital lobe infarct. 3. No acute intracranial abnormality. Electronically Signed   By: Virgina Norfolk M.D.   On: 09/11/2019 15:34   DG Chest Portable 1 View  Result Date: 08/31/2019 CLINICAL DATA:  Prior intubation. EXAM: PORTABLE CHEST  1 VIEW COMPARISON:  06/26/2019. FINDINGS: Endotracheal tube noted with its tip 2 cm above the carina. NG tube noted with its tip below left hemidiaphragm. Prominent atelectasis and consolidation right upper lung. Close follow-up exams suggested to demonstrate resolution and to exclude obstructing mass lesion/tumor. Cardiomegaly. No pulmonary venous congestion. Mild thoracic spine scoliosis concave left. IMPRESSION: 1. Endotracheal tube noted with its tip 2 cm above the carina. NG tube noted with its tip below left hemidiaphragm. 2. Prominent atelectasis and consolidation right upper lung. Close follow-up exam suggested to demonstrate resolution to exclude obstructing mass lesion/tumor. Electronically Signed   By: Marcello Moores  Register   On: 09/13/2019 12:54   EEG adult  Result Date: 08/26/2019 Lora Havens, MD     09/16/2019  7:18 PM Patient Name: Zoe Clarke MRN: [id number] Epilepsy Attending: Lora Havens Referring Physician/Provider: Dr Merlene Laughter Date: 08/26/2019 Duration: 22.46 mins Patient history: 69yo F s/p cardiac arrest on TTM. EEG to evaluate for seizures. Level of alertness: comatose AEDs during EEG study: Propofol Technical aspects: This  EEG study was done with scalp electrodes positioned according to the 10-20 International system of electrode placement. Electrical activity was acquired at a sampling rate of '500Hz'$  and reviewed with a high frequency filter of '70Hz'$  and a low frequency filter of '1Hz'$ . EEG data were recorded continuously and digitally stored. Description: EEG showed continuous generalized background suppression. Frequent generalized polyspikes were noted during which patient was noted to have eye opening and brief axial jerk consistent with myoclonic seizures. EEG was not reactive to tactile stimulation. Hyperventilation and photic stimulation were not performed.   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 Ordering provider was notified. [place]    Procedures .Critical Care Performed by: Gareth Morgan, MD Authorized by: Gareth Morgan, MD   Critical care provider statement:    Critical care time (minutes):  75   Critical care was time spent personally by me on the following activities:  Discussions with consultants, evaluation of patient's response to treatment, examination of patient, ordering and performing treatments and interventions, ordering and review of laboratory studies, ordering and review of radiographic studies, pulse oximetry, re-evaluation of patient's condition, obtaining history from patient or surrogate and review of old charts Procedure Name: Intubation Date/Time: 09/10/2019 9:02 PM Performed by: Gareth Morgan, MD Pre-anesthesia Checklist: Patient identified, Patient being monitored, Emergency Drugs available, Timeout performed and Suction available Oxygen Delivery Method: Ambu bag Preoxygenation: Pre-oxygenation with 100% oxygen Ventilation: Mask ventilation without difficulty Laryngoscope Size: Glidescope Grade View: Grade II Tube size: 7.5 mm Number of attempts: 1 Placement Confirmation: ETT inserted through vocal cords under direct vision,  CO2 detector and Breath sounds checked- equal and bilateral Secured at: 25 cm Dental Injury: Injury to lip       (including critical care time)  Medications Ordered in ED Medications  norepinephrine (LEVOPHED) 4-5 MG/250ML-% infusion SOLN (has no administration in time range)  norepinephrine (LEVOPHED) '4mg'$  in 224m premix infusion (2 mcg/min Intravenous New Bag/Given 08/18/2019 1331)  vancomycin (VANCOREADY) IVPB 1500 mg/300 mL (has no administration in time range)  heparin injection 5,000 Units (has no  administration in time range)  pantoprazole (PROTONIX) injection 40 mg (has no administration in time range)  docusate sodium (COLACE) capsule 100 mg (has no administration in time range)  piperacillin-tazobactam (ZOSYN) IVPB 3.375 g (has no administration in time range)    Followed by  piperacillin-tazobactam (ZOSYN) IVPB 3.375 g (has no administration in time range)  insulin aspart (novoLOG) injection 10 Units (has no administration in time range)  dextrose 50 % solution 50 mL (has no administration in time range)  propofol (DIPRIVAN) 1000 MG/100ML infusion (15 mcg/kg/min  104.3 kg Intravenous Rate/Dose Verify 08/31/2019 2000)  fentaNYL 25023m in NS 25073m48m64ml) infusion-PREMIX (has no administration in time range)  fentaNYL (SUBLIMAZE) bolus via infusion 25 mcg (has no administration in time range)  insulin regular, human (MYXREDLIN) 100 units/ 100 mL infusion ( Intravenous Rate/Dose Verify 08/25/2019 2000)  0.9 %  sodium chloride infusion (has no administration in time range)  dextrose 5 %-0.45 % sodium chloride infusion ( Intravenous Rate/Dose Verify 09/11/2019 2000)  dextrose 50 % solution 0-50 mL (has no administration in time range)  sodium bicarbonate 150 mEq in sterile water 1,000 mL infusion ( Intravenous Rate/Dose Verify 08/26/2019 2000)  0.9 %  sodium chloride infusion (has  no administration in time range)  levETIRAcetam (KEPPRA) IVPB 500 mg/100 mL premix (has no administration in time range)  LORazepam (ATIVAN) injection 1 mg (has no administration in time range)  hydrALAZINE (APRESOLINE) injection 10 mg (10 mg Intravenous Given 08/28/2019 1854)  chlorhexidine gluconate (MEDLINE KIT) (PERIDEX) 0.12 % solution 15 mL (has no administration in time range)  MEDLINE mouth rinse (has no administration in time range)  Chlorhexidine Gluconate Cloth 2 % PADS 6 each (has no administration in time range)  0.9 %  sodium chloride infusion (has no administration in time range)  sodium zirconium  cyclosilicate (LOKELMA) packet 10 g (has no administration in time range)  polyethylene glycol (MIRALAX / GLYCOLAX) packet 17 g (has no administration in time range)  sodium bicarbonate injection (50 mEq Intravenous Given 09/16/2019 1220)  insulin aspart (novoLOG) injection 5 Units (5 Units Intravenous Given 09/03/2019 1332)  ceFEPIme (MAXIPIME) 2 g in sodium chloride 0.9 % 100 mL IVPB (0 g Intravenous Stopped 08/19/2019 1449)  vancomycin (VANCOREADY) IVPB 2000 mg/400 mL (0 mg Intravenous Stopped 09/13/2019 1610)  sodium chloride 0.9 % bolus 1,000 mL (1,000 mLs Intravenous New Bag/Given 09/02/2019 1350)  aspirin suppository 300 mg (300 mg Rectal Given 09/01/2019 1444)  sodium chloride 0.9 % bolus 1,000 mL (1,000 mLs Intravenous New Bag/Given 08/21/2019 1517)  calcium gluconate 1 g/ 50 mL sodium chloride IVPB ( Intravenous Stopped 09/15/2019 1953)  levETIRAcetam (KEPPRA) IVPB 1000 mg/100 mL premix ( Intravenous Rate/Dose Verify 09/02/2019 2000)  LORazepam (ATIVAN) injection 2 mg (2 mg Intravenous Given 09/01/2019 1832)    ED Course  I have reviewed the triage vital signs and the nursing notes.  Pertinent labs & imaging results that were available during my care of the patient were reviewed by me and considered in my medical decision making (see chart for details).    MDM Rules/Calculators/A&P                          69yo female with history of CHF, htn, DM, and CKD who presents with concern for cardiac arrest.  Asystole and PEA arrest noted by EMS, had ROSC during transport followed by loss of pulses again. On arrival to the ED she has strong pulses and was given bicarb and intubated for further care.  Concern for possible DKA on arrival given hyperglycemia versus possible CHF by hx provided.    Discussed status with family including husband Gwyndolyn Saxon, nephew and niece immediately after patient's arrival.  Husband reports that she did not want to be kept alive by machines, even if for a short time.  At the time of our  discussion, her blood pressures again began to decrease and levophed initiated and discussed that while she is on life support it is unknown at this time if this is something reversible and not clear what her neurologic outcome will be.  Brought him to the bedside with plan to assess her breathing, and noted she was breathing over the ventilator and while we initially discussed comfort care, told RT that he wanted to continue aggressive measures which I agree is appropriate given she at that time was not requiring large dose pressors. Shortly after this was weaned off of pressors.  XR showed consolidation possible underlying mass which I have not yet discussed with family. Ordered empiric abx, insulin for hyperkalemia.  Admitted to PCCM for further care.   Final Clinical Impression(s) / ED Diagnoses Final diagnoses:  Metabolic acidosis  PEA (Pulseless electrical activity) (  [place])  Hyperkalemia    Rx / DC Orders ED Discharge Orders    None       Gareth Morgan, MD 09/14/2019 2104

## 2019-09-04 NOTE — Progress Notes (Addendum)
Pharmacy Antibiotic Note  Zoe Clarke is a 69 y.o. female admitted on 09/14/2019, post-CPR, concern for pna.  Pharmacy has been consulted for vancomycin and cefepime dosing.    Plan: -CCM changed cefepime to Zosyn consult  - Start Zosyn 3.375g IV x 1 dose over 30 min followed by Zosyn 3.375g IV q8h over 4 hours  Vancomycin 2g IV x1, then 1500 mg IV every 48 hours Cefepime 2g IV every 24 hour Add MRSA PCR Monitor renal function, Cx and clinical progression to narrow Vancomycin trough as needed  Height: 5\' 8"  (172.7 cm) IBW/kg (Calculated) : 63.9  No data recorded.  Recent Labs  Lab 08/27/2019 1223 09/06/2019 1224  WBC  --  12.3*  CREATININE 3.10*  --   LATICACIDVEN  --  7.4*    CrCl cannot be calculated (Unknown ideal weight.).    Allergies  Allergen Reactions  . Aspirin Other (See Comments)    Feels funny   . Allopurinol     Dizzy; hard to ambulate  Couldn't see; sweating  . Amlodipine Other (See Comments)    headache  . Carvedilol     wheezing  . Clonidine Derivatives     HEADACHES  . Lisinopril Swelling    Face swelling  . Other Itching    Squash  . Peanut Butter Flavor Other (See Comments)    constipation    Bertis Ruddy, PharmD Clinical Pharmacist ED Pharmacist Phone # [phone] 09/03/2019 1:30 PM

## 2019-09-04 NOTE — Consult Note (Signed)
NEURO HOSPITALIST CONSULT NOTE   Requestig physician: Dr. Tamala Julian  Reason for Consult: Anoxic brain injury and myoclonus status  History obtained from: Chart     HPI:                                                                                                                                          Zoe Clarke is an 69 y.o. female with a PMHx of HTN, sleep apnea, DM2, CKD stage 4, diastolic congestive heart failure, and asthma who presented to the [facility] ED after cardiac arrest at home. She was not feeling well yesterday night, but had refused to come to the hospital for evaluation. Her husband found her unresponsive this AM and called EMS. He noted her to be pulseless and he attempted CPR but was unsuccessful due to his frailty. CPR was started at 1111 by Fire department, with pulses regained by EMS at 1135. Per report, she had been found to be in asystole. Pulses were lost again at 1145 and CPR reinitiated with pulses regained. She arrived to the ED unresponsive. Initial exam by ED staff revealed GCS of 3, pupils reactive to light and good cough and gag, but no corneal or oculocephalic reflexes. CT head showed generalized atrophy and an old left sided infarction, with no acute abnormality. Labs were consistent with acute kidney and liver injury. CXR was suggestive of aspiration PNA. Myoclonic activity was then observed and EEG was obtained, which revealed myoclonus status.    EEG:  ABNORMALITIES: - Myoclonic seizure, generalized - Background suppression, generalized IMPRESSION: This study showed frequent myoclonic seizures during which patient was noted to have brief eye opening and axial jerk. 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  Past Medical History:  Diagnosis Date  . Allergy   . Arthritis   . Asthma    weazing at night uses inhalers  . Bronchitis    history of  .  Chronic diastolic CHF (congestive heart failure) ([place]) 04/2018  . CKD stage 4 due to type 2 diabetes mellitus ([place])   . Diabetes mellitus without complication ([place])   . Gastric ulcer   . GERD (gastroesophageal reflux disease)   . History of hiatal hernia   . Hypertension   . Osteoporosis   . Sleep apnea 09/22/2018    Past Surgical History:  Procedure Laterality Date  . DILATION AND CURETTAGE OF UTERUS    . EYE SURGERY Left    removed cataract w/ lens  . TUBAL LIGATION      Family History  Problem Relation Age of Onset  . Arthritis Mother   . Hypertension Mother   . Heart failure Mother   . Emphysema Mother   . Arthritis Maternal Grandmother   .  Hypertension Maternal Grandmother   . Stroke Maternal Grandfather   . Cancer Sister        unknown type cancer  . Heart failure Sister   . Lung disease Sister   . Heart failure Sister   . Lupus Child   . Colon cancer Neg Hx   . Esophageal cancer Neg Hx   . Stomach cancer Neg Hx   . Rectal cancer Neg Hx               Social History:  reports that she has been smoking cigarettes. She has a 19.50 pack-year smoking history. She has never used smokeless tobacco. She reports previous drug use. Drug: Marijuana. She reports that she does not drink alcohol.  Allergies  Allergen Reactions  . Aspirin Other (See Comments)    Feels funny   . Allopurinol     Dizzy; hard to ambulate  Couldn't see; sweating  . Amlodipine Other (See Comments)    headache  . Carvedilol     wheezing  . Clonidine Derivatives     HEADACHES  . Lisinopril Swelling    Face swelling  . Other Itching    Squash  . Peanut Butter Flavor Other (See Comments)    constipation    MEDICATIONS:                                                                                                                     Prior to Admission:  Medications Prior to Admission  Medication Sig Dispense Refill Last Dose  . albuterol (PROVENTIL HFA;VENTOLIN HFA) 108 (90 Base) MCG/ACT  inhaler Inhale 2 puffs into the lungs every 6 (six) hours as needed for wheezing or shortness of breath. 1 Inhaler 0   . atorvastatin (LIPITOR) 40 MG tablet Take 40 mg by mouth daily.     . cyclobenzaprine (FLEXERIL) 5 MG tablet Take 5 mg by mouth at bedtime as needed for muscle pain.     Marland Kitchen diclofenac Sodium (VOLTAREN) 1 % GEL Apply 2 g topically 4 (four) times daily. (Patient not taking: Reported on 06/17/2019) 50 g 0   . diphenhydramine-acetaminophen (TYLENOL PM) 25-500 MG TABS tablet Take 2 tablets by mouth at bedtime as needed (for pain).     Marland Kitchen doxazosin (CARDURA) 4 MG tablet Take 1 tablet (4 mg total) by mouth 2 (two) times daily. (Patient taking differently: Take 4 mg by mouth at bedtime. ) 60 tablet 0   . ergocalciferol (VITAMIN D2) 1.25 MG (50000 UT) capsule Take 50,000 Units by mouth every Friday.      . ferrous sulfate 325 (65 FE) MG tablet Take 325 mg by mouth daily with breakfast.     . fexofenadine (ALLEGRA) 180 MG tablet Take 180 mg by mouth daily as needed for allergies.      . furosemide (LASIX) 80 MG tablet Take 1 tablet (80 mg total) by mouth 2 (two) times daily. 60 tablet 0   . gabapentin (NEURONTIN) 300 MG capsule Take 1 capsule (300  mg total) by mouth 2 (two) times daily. (Patient taking differently: Take 900 mg by mouth at bedtime. ) 60 capsule 0   . glipiZIDE (GLUCOTROL XL) 10 MG 24 hr tablet Take 10 mg by mouth in the morning and at bedtime.      . hydrALAZINE (APRESOLINE) 100 MG tablet Take 1 tablet (100 mg total) by mouth 3 (three) times daily. 90 tablet 0   . hydrocortisone cream 1 % Apply topically 2 (two) times daily. Areas of itch (Patient taking differently: Apply 1 application topically 2 (two) times daily as needed for itching. ) 30 g 0   . isosorbide mononitrate (IMDUR) 60 MG 24 hr tablet Take 1 tablet (60 mg total) by mouth daily. 30 tablet 0   . linaclotide (LINZESS) 145 MCG CAPS capsule Take 145 mcg by mouth daily as needed (constipation).      Marland Kitchen  oxyCODONE-acetaminophen (PERCOCET/ROXICET) 5-325 MG tablet Take 1 tablet by mouth every 6 (six) hours as needed for moderate pain or severe pain.      . pantoprazole (PROTONIX) 40 MG tablet Take 1 tablet (40 mg total) by mouth daily. 90 tablet 3   . polyethylene glycol (MIRALAX / GLYCOLAX) packet Take 17 g by mouth daily as needed for mild constipation. 14 each 0    Scheduled: . chlorhexidine gluconate (MEDLINE KIT)  15 mL Mouth Rinse BID  . [START ON 09/05/2019] Chlorhexidine Gluconate Cloth  6 each Topical Q0600  . dextrose  1 ampule Intravenous Once  . heparin  5,000 Units Subcutaneous Q8H  . insulin aspart  10 Units Intravenous Once  . mouth rinse  15 mL Mouth Rinse 10 times per day  . pantoprazole (PROTONIX) IV  40 mg Intravenous QHS  . sodium zirconium cyclosilicate  10 g Per Tube TID   Continuous: . sodium chloride    . sodium chloride    . sodium chloride 10 mL/hr at 09/10/2019 2105  . dextrose 5 % and 0.45% NaCl 75 mL/hr at 08/30/2019 2000  . fentaNYL infusion INTRAVENOUS    . insulin 4 mL/hr at 08/31/2019 2000  . [START ON 09/05/2019] levETIRAcetam    . norepinephrine    . norepinephrine (LEVOPHED) Adult infusion 2 mcg/min (08/28/2019 1331)  . piperacillin-tazobactam     Followed by  . piperacillin-tazobactam (ZOSYN)  IV 3.375 g (08/27/2019 2107)  . propofol (DIPRIVAN) infusion 15 mcg/kg/min (09/14/2019 2000)  .  sodium bicarbonate (isotonic) infusion in sterile water 125 mL/hr at 08/21/2019 2000  . [START ON 09/06/2019] vancomycin       ROS:                                                                                                                                       Unable to obtain due to sedation/coma.    Blood pressure (!) 181/76, pulse 78, temperature (!) 96.4 F (35.8 C), resp. rate (!) 32, height '5\' 8"'$  (  1.727 m), weight 104.3 kg, SpO2 100 %.   General Examination:                                                                                                        Physical Exam  HEENT-  [place]/AT   Lungs- Intubated Extremities- Noncyanotic  Neurological Examination Mental Status: Comatose/sedated with GCS 3T.  Cranial Nerves: II: No blink to visual stimuli. Right pupil 2 mm and unreactive, left pupil pinpoint and unreactive III,IV, VI: No doll's eye reflex. Eyes are at the midline without spontaneous movement.  V,VII: Face flaccidly symmetric. No blink to eyelash stimulation bilaterally.  VIII: No response to voice IX,X: Intubated XI: Unable to assess XII: Intubated Motor/Sensory: Flaccid tone x 4. No myoclonic jerks, tremors or posturing noted. No movement to noxious stimuli x 4.  Deep Tendon Reflexes: 0 throughout Plantars: Mute bilaterally  Cerebellar/Gait: Unable to assess   Lab Results: Basic Metabolic Panel: Recent Labs  Lab 08/18/2019 1223 09/03/2019 1223 08/24/2019 1224 08/29/2019 1309 09/11/2019 1357 08/28/2019 1452 09/15/2019 1651 08/23/2019 1703  NA 145   < > 141 145  --  145 143 145  K 6.3*   < > 6.4* 5.9*  --  5.8* 6.2* 5.7*  CL 116*  --  116*  --   --   --  116*  --   CO2  --   --  12*  --   --   --  15*  --   GLUCOSE 333*  --  345*  --   --   --  276*  --   BUN 51*  --  42*  --   --   --  44*  --   CREATININE 3.10*  --  3.25*  --   --   --  3.15*  --   CALCIUM  --   --  7.9*  --   --   --  8.0*  --   MG  --   --  2.8*  --  2.5*  --   --   --   PHOS  --   --   --   --  7.4*  --   --   --    < > = values in this interval not displayed.    CBC: Recent Labs  Lab 09/02/2019 1223 09/07/2019 1224 09/15/2019 1309 08/29/2019 1452 08/19/2019 1703  WBC  --  12.3*  12.3*  --   --   --   NEUTROABS  --  9.2*  --   --   --   HGB 10.9* 9.2*  9.3* 10.2* 9.9* 10.2*  HCT 32.0* 34.1*  34.6* 30.0* 29.0* 30.0*  MCV  --  101.8*  102.4*  --   --   --   PLT  --  158  156  --   --   --     Cardiac Enzymes: Recent Labs  Lab 08/22/2019 1651  CKTOTAL 479*    Lipid Panel: Recent Labs  Lab 09/03/2019 1651  TRIG 52    Imaging:  CT Head Wo  Contrast  Result Date: 09/12/2019 CLINICAL DATA:  Found unresponsive. EXAM: CT HEAD WITHOUT CONTRAST TECHNIQUE: Contiguous axial images were obtained from the base of the skull through the vertex without intravenous contrast. COMPARISON:  November 03, 2015 FINDINGS: Brain: There is mild cerebral atrophy with widening of the extra-axial spaces and ventricular dilatation. There are areas of decreased attenuation within the white matter tracts of the supratentorial brain, consistent with microvascular disease changes. A small area of cortical and white matter low attenuation is seen within the parasagittal region of the left occipital lobe (axial CT image 19, CT series number 3). This is present on the prior study. Vascular: No hyperdense vessel or unexpected calcification. Skull: Normal. Negative for fracture or focal lesion. Sinuses/Orbits: There is mild bilateral ethmoid sinus mucosal thickening. Other: None. IMPRESSION: 1. Generalized cerebral atrophy. 2. Small, chronic left occipital lobe infarct. 3. No acute intracranial abnormality. Electronically Signed   By: Virgina Norfolk M.D.   On: 09/03/2019 15:34   DG Chest Portable 1 View  Result Date: 09/13/2019 CLINICAL DATA:  Prior intubation. EXAM: PORTABLE CHEST 1 VIEW COMPARISON:  06/26/2019. FINDINGS: Endotracheal tube noted with its tip 2 cm above the carina. NG tube noted with its tip below left hemidiaphragm. Prominent atelectasis and consolidation right upper lung. Close follow-up exams suggested to demonstrate resolution and to exclude obstructing mass lesion/tumor. Cardiomegaly. No pulmonary venous congestion. Mild thoracic spine scoliosis concave left. IMPRESSION: 1. Endotracheal tube noted with its tip 2 cm above the carina. NG tube noted with its tip below left hemidiaphragm. 2. Prominent atelectasis and consolidation right upper lung. Close follow-up exam suggested to demonstrate resolution to exclude obstructing mass lesion/tumor. Electronically  Signed   By: Marcello Moores  Register   On: 09/05/2019 12:54   EEG adult  Result Date: 08/19/2019 Lora Havens, MD     09/13/2019  7:18 PM Patient Name: Zoe Clarke MRN: [id number] Epilepsy Attending: Lora Havens Referring Physician/Provider: Dr Merlene Laughter Date: 08/18/2019 Duration: 22.46 mins Patient history: 69yo F s/p cardiac arrest on TTM. EEG to evaluate for seizures. Level of alertness: comatose AEDs during EEG study: Propofol Technical aspects: This EEG study was done with scalp electrodes positioned according to the 10-20 International system of electrode placement. Electrical activity was acquired at a sampling rate of '500Hz'$  and reviewed with a high frequency filter of '70Hz'$  and a low frequency filter of '1Hz'$ . EEG data were recorded continuously and digitally stored. Description: EEG showed continuous generalized background suppression. Frequent generalized polyspikes were noted during which patient was noted to have eye opening and brief axial jerk consistent with myoclonic seizures. EEG was not reactive to tactile stimulation. Hyperventilation and photic stimulation were not performed.   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 Ordering provider was notified. Lora Havens    Assessment: 69 year old female found down at home with eventual cardiac arrest. On EMS arrival she was in asystole. CPR in combination with ACLS protocol was started, ROSC was obtained after 24 mins. Patient cardiac arrested again and ROSC again obtained. Arrived to the ED unresponsive. Myoclonic activity was then observed and EEG was obtained, which revealed myoclonus status.  1. Keppra has been loaded.  2. Propofol infusion has been started.  3.  EEG has been converted  to LTM.  4. CT head: Generalized cerebral atrophy. Small, chronic left occipital lobe infarct. No acute intracranial abnormality 5. Exam reveals the patient to be sedated/comatose with flaccid extremities x 4, while on propofol sedation. No myoclonus, tremor or posturing noted.   Recommendations: 1.Continue Keppra at 500 mg IV BID 2. Continue propofol gtt x 24 hours, then taper off. After propofol and all other sedation have been stopped for 72 hours, obtain repeat neurological exam for prognostication.  3. Continue LTM EEG 4. MRI brain after EEG leads removed, if there are no contraindications 5. Myoclonus occurring after cardiac arrest increases the probabillity of a poor long-term neurological prognosis.   45 minutes spent in the emergent neurological evaluation and management of this critically ill patient  Electronically signed: Dr. Kerney Elbe 08/25/2019, 7:57 PM

## 2019-09-04 NOTE — Progress Notes (Signed)
PCCM Progress Note  Family called and update regarding Head CT results.   Johnsie Cancel, NP-C [facility] Contact / Pager information can be found on Amion  09/15/2019, 4:47 PM

## 2019-09-04 NOTE — Progress Notes (Signed)
°   09/11/2019 1500  Clinical Encounter Type  Visited With Family  Visit Type Follow-up  Referral From Chaplain  Stress Factors  Family Stress Factors Other (Comment) (family in disagreement with one another )   Chaplain visited with husband in consult room. Husband shared with chaplain his frustration with his family members. Chaplain provided active listening. Unfortunately, chaplain got called away to an end of life on another floor. Please page if further follow up is needed.   Rolin Barry [facility] [employer]  Pager: [phone]

## 2019-09-04 NOTE — Procedures (Addendum)
Patient Name: [place] Winslow  MRN: [id number]  Epilepsy Attending: Lora Havens  Referring Physician/Provider: Merlene Laughter, PA Date: 08/21/2019 Duration: 22.46 mins  Patient history: 69yo F s/p cardiac arrest on TTM. EEG to evaluate for seizures.  Level of alertness: comatose  AEDs during EEG study: Propofol  Technical aspects: This EEG study was done with scalp electrodes positioned according to the 10-20 International system of electrode placement. Electrical activity was acquired at a sampling rate of 500Hz  and reviewed with a high frequency filter of 70Hz  and a low frequency filter of 1Hz . EEG data were recorded continuously and digitally stored.   Description: EEG showed continuous generalized background suppression. Frequent generalized polyspikes were noted during which patient was noted to have eye opening and brief axial jerk consistent with myoclonic seizures. EEG was not reactive to tactile stimulation. Hyperventilation and photic stimulation were not performed.     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   Ordering provider was notified.  Jamerius Boeckman Barbra Sarks

## 2019-09-04 NOTE — H&P (Signed)
NAME:  Zoe Clarke, MRN:  [id number], DOB:  Oct 06, 1950, LOS: 0 ADMISSION DATE:  09/15/2019, CONSULTATION DATE:  09/05/2019 REFERRING MD:  Dr. Billy Fischer, CHIEF COMPLAINT:  Cardiac arrest   Brief History   69 year old female found down by husband at home with eventual cardiac arrest, rhythm pending EMS arrival was asystole.  History of present illness   Zoe Clarke is a 69 year old female with a past medical history significant for sleep apnea, hypertension, GERD, type 2 diabetes, diastolic congestive heart failure, asthma, hyperthermia who was found down by her husband today and suffered cardiac arrest.  History obtained via family as patient is currently unresponsive.  It appears patient husband found her minimally responsive this morning and attempted to arouse her but was not successful and at that time she became pulseless. Per husband then attempted to preform CPR but due to frailty he was unsuccessful. On EMS arrival arrival patient was found in asystole and CPR in combination with ACLS protocol was started, ROSC was obtained after 44mns. Patient cardiac arrested again and ROSC again obtained.  On arrival to ED patient was unresponsive. ABG 7.04 / 61.8 / 143 / 6.7. Lab work significant for hyperkalemia K 6.4, hyperglycemia 333, creatinine 3.25, Troponin 1,887, Lactic acid 7.4, WBC 12.3, hgb 9.3. Chest xray on arrival with consolidation in right upper lobe for which she underwent bedside bronchoscopy while in the ED. PCCM consulted for further management and admission.   Past Medical  History   Past Medical History:  Diagnosis Date  . Allergy   . Arthritis   . Asthma    weazing at night uses inhalers  . Bronchitis    history of  . Chronic diastolic CHF (congestive heart failure) ([place] 04[place]  . CKD stage 4 due to type 2 diabetes mellitus ([facility]   . Diabetes mellitus without complication ([place]   . Gastric ulcer   . GERD (gastroesophageal reflux disease)   . History of hiatal  hernia   . Hypertension   . Osteoporosis   . Sleep apnea 09/22/2018    Significant Hospital Events   Admitted 6/18  Consults:  PCCM  Procedures:  6/18 bedside bronchoscope with BAL   Significant Diagnostic Tests:  CRX 6/18 >  1. Endotracheal tube noted with its tip 2 cm above the carina. NG tube noted with its tip below left hemidiaphragm. 2. Prominent atelectasis and consolidation right upper lung. Close follow-up exam suggested to demonstrate resolution to exclude obstructing mass lesion/tumor.  Micro Data:  COVID 6/18 > MRSA PCR 6/18 > Blood culture 6/18 > Urine culture 6/18 BAL culture 6/18  Antimicrobials:  Zosyn 6/18   Interim history/subjective:  Unresponsive on vent   Objective   Blood pressure (!) 114/52, pulse (!) 53, resp. rate (!) 22, height _0  (1.727 m), SpO2 100 %.    Vent Mode: PRVC FiO2 (%):  [100 %] 100 % Set Rate:  [18 bmp-26 bmp] 26 bmp Vt Set:  [510 mL] 510 mL PEEP:  [8 cmH20] 8 cmH20 Plateau Pressure:  [27 cmH20] 27 cmH20   Intake/Output Summary (Last 24 hours) at 09/06/2019 1419 Last data filed at 09/06/2019 1307 Gross per 24 hour  Intake 2.45 ml  Output --  Net 2.45 ml   There were no vitals filed for this visit.  Examination: General: Chronically ill appearing elderly  female on mechanical ventilation, in NAD HEENT: ETT, MM pink/moist, pupils non-reactive, sclera non-icteric  Neuro: unresponsive, triggering vent, positive gag but no pupillary response, no sedating meds  CV: s1s2 regular rate and rhythm, no murmur, rubs, or gallops,  PULM:  Diminished breath sounds, right worse than left, tolerating vent,  GI: soft, bowel sounds active in all 4 quadrants, non-tender, non-distended Extremities: warm/dry, significant generalized  edema  Skin: no rashes or lesions  Resolved Hospital Problem list     Assessment & Plan:  Cardiac arrest -Unkonwn etiology question respiratory vs decompensated CHF  Circulatory shock -~20 minutes  downtime, unknown min before CPR initiated  P: Start TTM, goal 36 degrees. Continue levophed PRN, goal MAP > 65 Assess CVP's, echo, UDS. Trend troponin, lactate. Hold preadmission Cardura, Lasix, Hydralazine, and Imdur  Family would like to pursue full course treatment up until neurological baseline can be determined    Respiratory insufficiency / Acute hypoxic respiratory failure  - In the setting of cardia arrest  Questionable aspiration pneumonia -Chest xray on arrival with consolidation in right upper lobe for which she underwent bedside bronchoscopy while in the ED.  P: Continue ventilator support with lung protective strategies  Wean PEEP and FiO2 for sats greater than 90%. Head of bed elevated 30 degrees. Plateau pressures less than 30 cm H20.  Follow intermittent chest x-ray and ABG.   Ensure adequate pulmonary hygiene  Follow cultures  VAP bundle in place  PAD protocol Empiric Zosyn   At risk for anoxic encephalopathy/injury -Concern for anoxic injury given minimal neuro response  P: Hold any sedation RASS goal: 0 to -1 Assess EEG Neuro consult once rewarmed   HX of diastolic congestive heart failure  -Most recent ECHO 06/26/2019 with EF 60-65% with grade 1 diastolic dysfunction P: Obtain ECHO as above Monitor volume status Strict I&O  Appears volume overloaded but will need adequate resuscitation   Type 2 diabetes P: SSI CBG q4hrs  CKD stage 4 -Baseline creatinine 3.17, creatinine on admission 3.10 P: Follow renal function / urine output Trend Bmet Avoid nephrotoxins, ensure adequate renal perfusion  IV hydration  Goals of care  Patients family include spouse and son were updated extensively regarding cardiac arrest and now poor neurological exam. Decision was made collectively among family to continue aggressive medical care but make patient a DNR. We will await head CT scan results and if there is evidence of severe anoxic injury family will pursue  comfort measures.      Best practice:  Diet: NPO Pain/Anxiety/Delirium protocol (if indicated): None VAP protocol (if indicated): In place  DVT prophylaxis: Sub heparin GI prophylaxis: PPI Glucose control: SSI Mobility: Bedrest Code Status: Full Family Communication: Updated extensively at bedside  Disposition: ICU  Labs   CBC: Recent Labs  Lab 08/30/2019 1218 09/10/2019 1223 08/22/2019 1224 08/20/2019 1309  WBC  --   --  12.3*  --   HGB 10.9* 10.9* 9.3* 10.2*  HCT 32.0* 32.0* 34.6* 30.0*  MCV  --   --  102.4*  --   PLT  --   --  156  --     Basic Metabolic Panel: Recent Labs  Lab 08/25/2019 1218 09/06/2019 1223 09/15/2019 1224 09/03/2019 1309  NA 145 145 141 145  K 6.3* 6.3* 6.4* 5.9*  CL  --  116* 116*  --   CO2  --   --  12*  --   GLUCOSE  --  333* 345*  --   BUN  --  51* 42*  --   CREATININE  --  3.10* 3.25*  --   CALCIUM  --   --  7.9*  --  MG  --   --  2.8*  --    GFR: CrCl cannot be calculated (Unknown ideal weight.). Recent Labs  Lab 08/29/2019 1224  WBC 12.3*  LATICACIDVEN 7.4*    Liver Function Tests: No results for input(s): AST, ALT, ALKPHOS, BILITOT, PROT, ALBUMIN in the last 168 hours. No results for input(s): LIPASE, AMYLASE in the last 168 hours. No results for input(s): AMMONIA in the last 168 hours.  ABG    Component Value Date/Time   PHART 7.087 (LL) 09/07/2019 1309   PCO2ART 60.9 (H) 09/16/2019 1309   PO2ART 197 (H) 08/29/2019 1309   HCO3 18.3 (L) 09/06/2019 1309   TCO2 20 (L) 09/15/2019 1309   ACIDBASEDEF 12.0 (H) 09/05/2019 1309   O2SAT 99.0 09/09/2019 1309     Coagulation Profile: No results for input(s): INR, PROTIME in the last 168 hours.  Cardiac Enzymes: No results for input(s): CKTOTAL, CKMB, CKMBINDEX, TROPONINI in the last 168 hours.  HbA1C: Hgb A1c MFr Bld  Date/Time Value Ref Range Status  06/25/2019 09:14 AM 5.4 4.8 - 5.6 % Final    Comment:    (NOTE)         Prediabetes: 5.7 - 6.4         Diabetes: >6.4          Glycemic control for adults with diabetes: <7.0   12/20/2018 05:39 PM 6.2 (H) 4.8 - 5.6 % Final    Comment:    (NOTE) Pre diabetes:          5.7%-6.4% Diabetes:              >6.4% Glycemic control for   <7.0% adults with diabetes     CBG: Recent Labs  Lab 08/24/2019 1222  GLUCAP 331*    Review of Systems:   Unable to gather due to unresponsiveness   Past Medical History  She,  has a past medical history of Allergy, Arthritis, Asthma, Bronchitis, Chronic diastolic CHF (congestive heart failure) ([place]) (04/2018), CKD stage 4 due to type 2 diabetes mellitus ([place]), Diabetes mellitus without complication ([place]), Gastric ulcer, GERD (gastroesophageal reflux disease), History of hiatal hernia, Hypertension, Osteoporosis, and Sleep apnea (09/22/2018).   Surgical History    Past Surgical History:  Procedure Laterality Date  . DILATION AND CURETTAGE OF UTERUS    . EYE SURGERY Left    removed cataract w/ lens  . TUBAL LIGATION       Social History   reports that she has been smoking cigarettes. She has a 19.50 pack-year smoking history. She has never used smokeless tobacco. She reports previous drug use. Drug: Marijuana. She reports that she does not drink alcohol.   Family History   Her family history includes Arthritis in her maternal grandmother and mother; Cancer in her sister; Emphysema in her mother; Heart failure in her mother, sister, and sister; Hypertension in her maternal grandmother and mother; Lung disease in her sister; Lupus in her child; Stroke in her maternal grandfather. There is no history of Colon cancer, Esophageal cancer, Stomach cancer, or Rectal cancer.   Allergies Allergies  Allergen Reactions  . Aspirin Other (See Comments)    Feels funny   . Allopurinol     Dizzy; hard to ambulate  Couldn't see; sweating  . Amlodipine Other (See Comments)    headache  . Carvedilol     wheezing  . Clonidine Derivatives     HEADACHES  . Lisinopril Swelling    Face  swelling  . Other Itching  Squash  . Peanut Butter Flavor Other (See Comments)    constipation     Home Medications  Prior to Admission medications   Medication Sig Start Date End Date Taking? Authorizing Provider  albuterol (PROVENTIL HFA;VENTOLIN HFA) 108 (90 Base) MCG/ACT inhaler Inhale 2 puffs into the lungs every 6 (six) hours as needed for wheezing or shortness of breath. 04/14/18   Bonnell Public, MD  atorvastatin (LIPITOR) 40 MG tablet Take 40 mg by mouth daily.    [provider]  cyclobenzaprine (FLEXERIL) 5 MG tablet Take 5 mg by mouth at bedtime as needed for muscle pain. 06/22/19   [provider]  diclofenac Sodium (VOLTAREN) 1 % GEL Apply 2 g topically 4 (four) times daily. Patient not taking: Reported on 06/17/2019 02/18/19   Geradine Girt, DO  diphenhydramine-acetaminophen (TYLENOL PM) 25-500 MG TABS tablet Take 2 tablets by mouth at bedtime as needed (for pain).    [provider]  doxazosin (CARDURA) 4 MG tablet Take 1 tablet (4 mg total) by mouth 2 (two) times daily. Patient taking differently: Take 4 mg by mouth at bedtime.  06/30/19 07/30/19  Darliss Cheney, MD  ergocalciferol (VITAMIN D2) 1.25 MG (50000 UT) capsule Take 50,000 Units by mouth every Friday.     [provider]  ferrous sulfate 325 (65 FE) MG tablet Take 325 mg by mouth daily with breakfast.    [provider]  fexofenadine (ALLEGRA) 180 MG tablet Take 180 mg by mouth daily as needed for allergies.     [provider]  furosemide (LASIX) 80 MG tablet Take 1 tablet (80 mg total) by mouth 2 (two) times daily. 02/18/19   Geradine Girt, DO  gabapentin (NEURONTIN) 300 MG capsule Take 1 capsule (300 mg total) by mouth 2 (two) times daily. Patient taking differently: Take 900 mg by mouth at bedtime.  02/18/19   Geradine Girt, DO  glipiZIDE (GLUCOTROL XL) 10 MG 24 hr tablet Take 10 mg by mouth in the morning and at bedtime.     [provider]    hydrALAZINE (APRESOLINE) 100 MG tablet Take 1 tablet (100 mg total) by mouth 3 (three) times daily. 02/18/19   Geradine Girt, DO  hydrocortisone cream 1 % Apply topically 2 (two) times daily. Areas of itch Patient taking differently: Apply 1 application topically 2 (two) times daily as needed for itching.  09/30/18   Pokhrel, Corrie Mckusick, MD  isosorbide mononitrate (IMDUR) 60 MG 24 hr tablet Take 1 tablet (60 mg total) by mouth daily. 07/01/19 07/31/19  Darliss Cheney, MD  linaclotide (LINZESS) 145 MCG CAPS capsule Take 145 mcg by mouth daily as needed (constipation).     [provider]  oxyCODONE-acetaminophen (PERCOCET/ROXICET) 5-325 MG tablet Take 1 tablet by mouth every 6 (six) hours as needed for moderate pain or severe pain.     [provider]  pantoprazole (PROTONIX) 40 MG tablet Take 1 tablet (40 mg total) by mouth daily. 07/29/19   Skeet Latch, MD  polyethylene glycol [facility] / [name] packet Take 17 g by mouth daily as needed for mild constipation. Patient not taking: Reported on 07/07/2019 04/14/18   Bonnell Public, MD     Critical care time:   CRITICAL CARE Performed by: Johnsie Cancel  Total critical care time: 55 minutes  Critical care time was exclusive of separately billable procedures and treating other patients.  Critical care was necessary to treat or prevent imminent or life-threatening deterioration.  Critical  care was time spent personally by me on the following activities: development of treatment plan with patient and/or surrogate as well as nursing, discussions with consultants, evaluation of patient's response to treatment, examination of patient, obtaining history from patient or surrogate, ordering and performing treatments and interventions, ordering and review of laboratory studies, ordering and review of radiographic studies, pulse oximetry and re-evaluation of patient's condition.  Johnsie Cancel, NP-C [facility] Contact / Pager information can be found on Amion  09/06/2019, 3:08 PM

## 2019-09-04 NOTE — Progress Notes (Signed)
Critical results on ABG given to RN. PH 7.08 PaCO2 60.9 PaO2 197 HC03 18.3  Backup RR on vent increased from 18 to 26.

## 2019-09-04 NOTE — Procedures (Signed)
Bronchoscopy  Indication: RUL infiltrate vs. Atelectasis, r/o foreign body  Consent: obtained verbally from family  Anesthesia: none  Procedure - Timeout performed - Bronchoscope advanced through ETT - Airways examined down to subsegmental level - Following airway examination, BAL performed in RUL  Findings - RUL without foreign body - BAL from RUL c/w aspirated food particles  Specimen(s): RUL BAL  Complications: None immediate

## 2019-09-05 LAB — BASIC METABOLIC PANEL
Anion gap: 8 (ref 5–15)
Anion gap: 10 (ref 5–15)
Anion gap: 13 (ref 5–15)
Anion gap: 10 (ref 5–15)
Anion gap: 11 (ref 5–15)
BUN: 46 mg/dL — ABNORMAL HIGH (ref 8–23)
BUN: 47 mg/dL — ABNORMAL HIGH (ref 8–23)
BUN: 47 mg/dL — ABNORMAL HIGH (ref 8–23)
BUN: 49 mg/dL — ABNORMAL HIGH (ref 8–23)
BUN: 49 mg/dL — ABNORMAL HIGH (ref 8–23)
CO2: 22 mmol/L (ref 22–32)
CO2: 24 mmol/L (ref 22–32)
CO2: 23 mmol/L (ref 22–32)
CO2: 24 mmol/L (ref 22–32)
CO2: 24 mmol/L (ref 22–32)
Calcium: 7.9 mg/dL — ABNORMAL LOW (ref 8.9–10.3)
Calcium: 7.9 mg/dL — ABNORMAL LOW (ref 8.9–10.3)
Calcium: 8 mg/dL — ABNORMAL LOW (ref 8.9–10.3)
Calcium: 7.8 mg/dL — ABNORMAL LOW (ref 8.9–10.3)
Calcium: 7.5 mg/dL — ABNORMAL LOW (ref 8.9–10.3)
Chloride: 114 mmol/L — ABNORMAL HIGH (ref 98–111)
Chloride: 109 mmol/L (ref 98–111)
Chloride: 107 mmol/L (ref 98–111)
Chloride: 107 mmol/L (ref 98–111)
Chloride: 105 mmol/L (ref 98–111)
Creatinine, Ser: 3.25 mg/dL — ABNORMAL HIGH (ref 0.44–1.00)
Creatinine, Ser: 3.38 mg/dL — ABNORMAL HIGH (ref 0.44–1.00)
Creatinine, Ser: 3.44 mg/dL — ABNORMAL HIGH (ref 0.44–1.00)
Creatinine, Ser: 3.57 mg/dL — ABNORMAL HIGH (ref 0.44–1.00)
Creatinine, Ser: 3.6 mg/dL — ABNORMAL HIGH (ref 0.44–1.00)
GFR calc Af Amer: 16 mL/min — ABNORMAL LOW (ref >60)
GFR calc Af Amer: 15 mL/min — ABNORMAL LOW (ref >60)
GFR calc Af Amer: 15 mL/min — ABNORMAL LOW (ref >60)
GFR calc Af Amer: 14 mL/min — ABNORMAL LOW (ref >60)
GFR calc Af Amer: 14 mL/min — ABNORMAL LOW (ref >60)
GFR calc non Af Amer: 14 mL/min — ABNORMAL LOW (ref >60)
GFR calc non Af Amer: 13 mL/min — ABNORMAL LOW (ref >60)
GFR calc non Af Amer: 13 mL/min — ABNORMAL LOW (ref >60)
GFR calc non Af Amer: 13 mL/min — ABNORMAL LOW (ref >60)
GFR calc non Af Amer: 12 mL/min — ABNORMAL LOW (ref >60)
Glucose, Bld: 109 mg/dL — ABNORMAL HIGH (ref 70–99)
Glucose, Bld: 107 mg/dL — ABNORMAL HIGH (ref 70–99)
Glucose, Bld: 96 mg/dL (ref 70–99)
Glucose, Bld: 90 mg/dL (ref 70–99)
Glucose, Bld: 79 mg/dL (ref 70–99)
Potassium: 5.1 mmol/L (ref 3.5–5.1)
Potassium: 4.9 mmol/L (ref 3.5–5.1)
Potassium: 4.9 mmol/L (ref 3.5–5.1)
Potassium: 4.7 mmol/L (ref 3.5–5.1)
Potassium: 4.6 mmol/L (ref 3.5–5.1)
Sodium: 144 mmol/L (ref 135–145)
Sodium: 143 mmol/L (ref 135–145)
Sodium: 143 mmol/L (ref 135–145)
Sodium: 141 mmol/L (ref 135–145)
Sodium: 140 mmol/L (ref 135–145)

## 2019-09-05 LAB — GLUCOSE, CAPILLARY
Glucose-Capillary: 100 mg/dL — ABNORMAL HIGH (ref 70–99)
Glucose-Capillary: 104 mg/dL — ABNORMAL HIGH (ref 70–99)
Glucose-Capillary: 105 mg/dL — ABNORMAL HIGH (ref 70–99)
Glucose-Capillary: 108 mg/dL — ABNORMAL HIGH (ref 70–99)
Glucose-Capillary: 108 mg/dL — ABNORMAL HIGH (ref 70–99)
Glucose-Capillary: 108 mg/dL — ABNORMAL HIGH (ref 70–99)
Glucose-Capillary: 109 mg/dL — ABNORMAL HIGH (ref 70–99)
Glucose-Capillary: 109 mg/dL — ABNORMAL HIGH (ref 70–99)
Glucose-Capillary: 113 mg/dL — ABNORMAL HIGH (ref 70–99)
Glucose-Capillary: 113 mg/dL — ABNORMAL HIGH (ref 70–99)
Glucose-Capillary: 119 mg/dL — ABNORMAL HIGH (ref 70–99)
Glucose-Capillary: 120 mg/dL — ABNORMAL HIGH (ref 70–99)
Glucose-Capillary: 126 mg/dL — ABNORMAL HIGH (ref 70–99)
Glucose-Capillary: 81 mg/dL (ref 70–99)
Glucose-Capillary: 82 mg/dL (ref 70–99)
Glucose-Capillary: 90 mg/dL (ref 70–99)
Glucose-Capillary: 99 mg/dL (ref 70–99)

## 2019-09-05 LAB — CBC
HCT: 26.6 % — ABNORMAL LOW (ref 36.0–46.0)
Hemoglobin: 8.3 g/dL — ABNORMAL LOW (ref 12.0–15.0)
MCH: 27.6 pg (ref 26.0–34.0)
MCHC: 31.2 g/dL (ref 30.0–36.0)
MCV: 88.4 fL (ref 80.0–100.0)
Platelets: 113 10*3/uL — ABNORMAL LOW (ref 150–400)
RBC: 3.01 MIL/uL — ABNORMAL LOW (ref 3.87–5.11)
RDW: 15.7 % — ABNORMAL HIGH (ref 11.5–15.5)
WBC: 14.2 10*3/uL — ABNORMAL HIGH (ref 4.0–10.5)
nRBC: 0.1 % (ref 0.0–0.2)

## 2019-09-05 LAB — POCT I-STAT 7, (LYTES, BLD GAS, ICA,H+H)
Acid-base deficit: 1 mmol/L (ref 0.0–2.0)
Bicarbonate: 22.1 mmol/L (ref 20.0–28.0)
Calcium, Ion: 1.12 mmol/L — ABNORMAL LOW (ref 1.15–1.40)
HCT: 27 % — ABNORMAL LOW (ref 36.0–46.0)
Hemoglobin: 9.2 g/dL — ABNORMAL LOW (ref 12.0–15.0)
O2 Saturation: 97 %
Patient temperature: 36
Potassium: 5 mmol/L (ref 3.5–5.1)
Sodium: 145 mmol/L (ref 135–145)
TCO2: 23 mmol/L (ref 22–32)
pCO2 arterial: 28.3 mmHg — ABNORMAL LOW (ref 32.0–48.0)
pH, Arterial: 7.497 — ABNORMAL HIGH (ref 7.350–7.450)
pO2, Arterial: 81 mmHg — ABNORMAL LOW (ref 83.0–108.0)

## 2019-09-05 LAB — PROTIME-INR
INR: 1.7 — ABNORMAL HIGH (ref 0.8–1.2)
Prothrombin Time: 19 seconds — ABNORMAL HIGH (ref 11.4–15.2)

## 2019-09-05 LAB — URINE CULTURE: Culture: NO GROWTH

## 2019-09-05 MED ORDER — GLYCOPYRROLATE 1 MG PO TABS: 1.0000 mg | ORAL_TABLET | ORAL | Status: DC | PRN

## 2019-09-05 MED ORDER — DEXTROSE 5 % IV SOLN: INTRAVENOUS | Status: DC

## 2019-09-05 MED ORDER — INSULIN DETEMIR 100 UNIT/ML ~~LOC~~ SOLN
8.0000 [IU] | Freq: Every day | SUBCUTANEOUS | Status: DC
  Administered 2019-09-05 – 2019-09-08 (×4): 8 [IU] via SUBCUTANEOUS
  Filled 2019-09-05 (×5): qty 0.08

## 2019-09-05 MED ORDER — ACETAMINOPHEN 325 MG PO TABS: 650.0000 mg | ORAL_TABLET | Freq: Four times a day (QID) | ORAL | Status: DC | PRN

## 2019-09-05 MED ORDER — GLYCOPYRROLATE 0.2 MG/ML IJ SOLN: 0.2000 mg | INTRAMUSCULAR | Status: DC | PRN

## 2019-09-05 MED ORDER — INSULIN ASPART 100 UNIT/ML ~~LOC~~ SOLN
2.0000 [IU] | SUBCUTANEOUS | Status: DC
  Administered 2019-09-07 – 2019-09-08 (×4): 2 [IU] via SUBCUTANEOUS

## 2019-09-05 MED ORDER — DIPHENHYDRAMINE HCL 50 MG/ML IJ SOLN: 25.0000 mg | INTRAMUSCULAR | Status: DC | PRN

## 2019-09-05 MED ORDER — POLYVINYL ALCOHOL 1.4 % OP SOLN
1.0000 [drp] | Freq: Four times a day (QID) | OPHTHALMIC | Status: DC | PRN
  Filled 2019-09-05: qty 15

## 2019-09-05 MED ORDER — ACETAMINOPHEN 650 MG RE SUPP: 650.0000 mg | Freq: Four times a day (QID) | RECTAL | Status: DC | PRN

## 2019-09-05 NOTE — Progress Notes (Signed)
Neurology Progress Note   S:// Patient seen and examined. Continues on LTM EEG. Overnight EEG showed evidence of generalized epileptogenicity and profound diffuse encephalopathy likely secondary to hypoxic anoxic brain injury   O:// Current vital signs: BP (!) 181/76   Pulse 84   Temp (!) 97 F (36.1 C) (Bladder)   Resp (!) 28   Ht 5\' 8"  (1.727 m)   Wt 132.6 kg   SpO2 100%   BMI 44.45 kg/m  Vital signs in last 24 hours: Temp:  [91.8 F (33.2 C)-97 F (36.1 C)] 97 F (36.1 C) (06/19 0800) Pulse Rate:  [51-84] 84 (06/19 0800) Resp:  [0-32] 28 (06/19 0800) BP: (91-181)/(47-126) 181/76 (06/18 1854) SpO2:  [89 %-100 %] 100 % (06/19 0800) Arterial Line BP: (118-186)/(61-80) 165/69 (06/19 0800) FiO2 (%):  [40 %-100 %] 40 % (06/19 0741) Weight:  [104.3 kg-132.6 kg] 132.6 kg (06/19 0359) General: Sedated intubated HEENT: Normocephalic atraumatic Lungs: Vented, breathing with the vent CVS: Regular rhythm Abdomen soft nondistended nontender Extremities warm well perfused with trace edema Neurological exam Sedated intubated-GCS 3 T. No spontaneous movements in the extremities Does not follow commands or open eyes to voice. She has almost myoclonic looking eye movements pretty regularly. Cranial nerves: Right pupil has an opacity but is very sluggishly reactive if at all, left pupil is round and sluggishly reactive.  Corneals present bilaterally.  Facial symmetry difficult to ascertain Motor exam: No spontaneous movement.  Tone flaccid. Sensory exam: No movement or withdrawal to noxious stimulation. DTRs mute all over.  Plantars mute. Medications  Current Facility-Administered Medications:  .  0.9 %  sodium chloride infusion, , Intravenous, Continuous, 06-08-2001, MD .  Place/Maintain arterial line, , , Until Discontinued **AND** 0.9 %  sodium chloride infusion, , Intra-arterial, PRN, Lorin Glass, MD .  0.9 %  sodium chloride infusion, , Intravenous, Continuous,  Lorin Glass, MD, Stopped at 09/05/19 0402 .  chlorhexidine gluconate (MEDLINE KIT) (PERIDEX) 0.12 % solution 15 mL, 15 mL, Mouth Rinse, BID, 09/07/19, MD, 15 mL at 09/05/19 0808 .  Chlorhexidine Gluconate Cloth 2 % PADS 6 each, 6 each, Topical, Q0600, 09/07/19, MD .  dextrose 5 %-0.45 % sodium chloride infusion, , Intravenous, Continuous, Lorin Glass, MD, Last Rate: 75 mL/hr at 09/05/19 0800, Rate Verify at 09/05/19 0800 .  dextrose 50 % solution 0-50 mL, 0-50 mL, Intravenous, PRN, 02-28-1974, MD .  dextrose 50 % solution 50 mL, 1 ampule, Intravenous, Once, Lorin Glass, Whitney F, NP .  docusate sodium (COLACE) capsule 100 mg, 100 mg, Oral, BID PRN, 08-15-1993 F, NP .  fentaNYL (SUBLIMAZE) bolus via infusion 25 mcg, 25 mcg, Intravenous, Q15 min PRN, Raymon Mutton, MD .  fentaNYL Lorin Glass in NS (19mcg/ml) infusion-PREMIX, 25-200 mcg/hr, Intravenous, Continuous, 9m, MD .  heparin injection 5,000 Units, 5,000 Units, Subcutaneous, Q8H, Lorin Glass F, NP, 5,000 Units at 09/05/19 0555 .  hydrALAZINE (APRESOLINE) injection 10 mg, 10 mg, Intravenous, Q4H PRN, 09/07/19 F, NP, 10 mg at 09/05/19 0823 .  insulin aspart (novoLOG) injection 10 Units, 10 Units, Intravenous, Once, Davis, Whitney F, NP .  insulin regular, human (MYXREDLIN) 100 units/ 100 mL infusion, , Intravenous, Continuous, 08-15-1993, MD, Last Rate: 0.4 mL/hr at 09/05/19 0800, Rate Verify at 09/05/19 0800 .  levETIRAcetam (KEPPRA) IVPB 500 mg/100 mL premix, 500 mg, Intravenous, Q12H, 09/07/19, MD, Last Rate: 400 mL/hr at 09/05/19 0822, 500 mg at  09/05/19 9417 .  LORazepam (ATIVAN) injection 1 mg, 1 mg, Intravenous, Q1H PRN, Merlene Laughter F, NP, 1 mg at 09/05/19 0428 .  MEDLINE mouth rinse, 15 mL, Mouth Rinse, 10 times per day, Candee Furbish, MD, 15 mL at 09/05/19 0600 .  norepinephrine (LEVOPHED) '4mg'$  in 223m premix infusion, 0-40 mcg/min, Intravenous, Continuous, Schlossman,  Erin, MD, Last Rate: 7.5 mL/hr at 09/14/2019 1331, 2 mcg/min at 08/24/2019 1331 .  pantoprazole (PROTONIX) injection 40 mg, 40 mg, Intravenous, QHS, DMerlene LaughterF, NP, 40 mg at 08/25/2019 2107 .  piperacillin-tazobactam (ZOSYN) IVPB 3.375 g, 3.375 g, Intravenous, Once **FOLLOWED BY** piperacillin-tazobactam (ZOSYN) IVPB 3.375 g, 3.375 g, Intravenous, Q8H, SCandee Furbish MD, Last Rate: 12.5 mL/hr at 09/05/19 0800, Rate Verify at 09/05/19 0800 .  polyethylene glycol (MIRALAX / GLYCOLAX) packet 17 g, 17 g, Per Tube, Daily PRN, SCandee Furbish MD .  propofol (DIPRIVAN) 1000 MG/100ML infusion, 5-80 mcg/kg/min, Intravenous, Titrated, SCandee Furbish MD, Last Rate: 9.39 mL/hr at 09/05/19 0800, 15 mcg/kg/min at 09/05/19 0800 .  sodium bicarbonate 150 mEq in sterile water 1,000 mL infusion, , Intravenous, Continuous, SCandee Furbish MD, Last Rate: 125 mL/hr at 09/05/19 0800, Rate Verify at 09/05/19 0800 .  sodium zirconium cyclosilicate (LOKELMA) packet 10 g, 10 g, Per Tube, TID, SCandee Furbish MD, 10 g at 09/10/2019 2107 .  [START ON 09/06/2019] vancomycin (VANCOREADY) IVPB 1500 mg/300 mL, 1,500 mg, Intravenous, Q48H, OBertis Ruddy RCo[facility] CenterLabs CBC    Component Value Date/Time   WBC 14.2 (H) 09/05/2019 0258   RBC 3.01 (L) 09/05/2019 0258   HGB 9.2 (L) 09/05/2019 0300   HGB 12.2 09/10/2017 0942   HCT 27.0 (L) 09/05/2019 0300   HCT 38.7 09/10/2017 0942   PLT 113 (L) 09/05/2019 0258   PLT 290 09/10/2017 0942   MCV 88.4 09/05/2019 0258   MCV 81.6 01/13/2015 1506   MCH 27.6 09/05/2019 0258   MCHC 31.2 09/05/2019 0258   RDW 15.7 (H) 09/05/2019 0258   LYMPHSABS 2.5 09/14/2019 1224   MONOABS 0.6 08/28/2019 1224   EOSABS 0.0 09/11/2019 1224   BASOSABS 0.0 09/13/2019 1224    CMP     Component Value Date/Time   NA 145 09/05/2019 0300   NA 142 11/21/2018 0812   K 5.0 09/05/2019 0300   CL 114 (H) 09/05/2019 0258   CO2 22 09/05/2019 0258   GLUCOSE 109 (H) 09/05/2019 0258   BUN 46 (H) 09/05/2019  0258   BUN 29 (H) 11/21/2018 0812   CREATININE 3.25 (H) 09/05/2019 0258   CREATININE 1.81 (H) 08/25/2015 1545   CALCIUM 7.9 (L) 09/05/2019 0258   PROT 5.6 (L) 08/29/2019 1357   ALBUMIN 2.4 (L) 09/11/2019 1357   AST 736 (H) 08/30/2019 1357   ALT 627 (H) 09/03/2019 1357   ALKPHOS 204 (H) 08/25/2019 1357   BILITOT 1.5 (H) 08/19/2019 1357   GFRNONAA 14 (L) 09/05/2019 0258   GFRNONAA 29 (L) 08/25/2015 1545   GFRAA 16 (L) 09/05/2019 0258   GFRAA 34 (L) 08/25/2015 1545     Imaging I have reviewed images in epic and the results pertinent to this consultation are: CT head on arrival with no acute changes.  LTM EEG from overnight IMPRESSION: This studyshowed evidence of generalized epileptogenicity as well as profound diffuse encephalopathy, most likely secondary to anoxic-hypoxic brain injury.   Assessment: 69year old woman found down at home with cardiac arrest, on EMS arrival was in asystole-CPR in combination with ACLS protocol started-ROSC obtained after  about 24 minutes. She had another arrest and ROSC again was obtained. Unresponsive on ED arrival.  Myoclonic jerking noted.  EEG also noted myoclonic status epilepticus. Loaded with Keppra.  Propofol infusion started. With the above medications, the myoclonic status appears to have resolved but she continues to have generalized epileptogenicity and profound slowing suggestive of hypoxic anoxic brain injury.  Impression: -Myoclonic status epilepticus -anoxic brain injury  Recommendations: Continue Keppra 500 twice daily Continue propofol for now.  She will be rewarmed starting 7 PM today, Saturday and would be normothermic Sunday morning. Serial exams Prognostication exam will have to be around 72 hours after normothermia and off of sedation. Continue LTM EEG for now part of the hypothermia protocol. As already mentioned by Dr. Cheral Marker, myoclonic activity soon after cardiac arrest, usually portends poor prognosis in terms of  neurologically meaningful recovery. I have discussed my plan with the patient's RN at bedside. The family is not here yet, will touch base during the day is unable to.  I have requested the nurse to page me when the family arrives bedside if they want to have a face-to-face conversation, otherwise will call them over the phone to update.  -- Amie Portland, MD [facility] Pager: [phone] If 7pm to 7am, please call on call as listed on AMION.  CRITICAL CARE ATTESTATION Performed by: Amie Portland, MD Total critical care time: 33 minutes Critical care time was exclusive of separately billable procedures and treating other patients and/or supervising APPs/Residents/Students Critical care was necessary to treat or prevent imminent or life-threatening deterioration due to profound encephalopathy-likely anoxic brain injury versus hypoxic ischemic encephalopathy post cardiac arrest. This patient is critically ill and at significant risk for neurological worsening and/or death and care requires constant monitoring. Critical care was time spent personally by me on the following activities: development of treatment plan with patient and/or surrogate as well as nursing, discussions with consultants, evaluation of patient's response to treatment, examination of patient, obtaining history from patient or surrogate, ordering and performing treatments and interventions, ordering and review of laboratory studies, ordering and review of radiographic studies, pulse oximetry, re-evaluation of patient's condition, participation in multidisciplinary rounds and medical decision making of high complexity in the care of this patient.

## 2019-09-05 NOTE — Progress Notes (Signed)
Inpatient Diabetes Program Recommendations  AACE/ADA: New Consensus Statement on Inpatient Glycemic Control (2015)  Target Ranges:  Prepandial:   less than 140 mg/dL      Peak postprandial:   less than 180 mg/dL (1-2 hours)      Critically ill patients:  140 - 180 mg/dL   Lab Results  Component Value Date   GLUCAP 100 (H) 09/05/2019   HGBA1C 6.4 (H) 09/07/2019    Review of Glycemic Control Results for ADAEZE, BETTER (MRN [id number]) as of 09/05/2019 09:36  Ref. Range 09/05/2019 05:54 09/05/2019 06:45 09/05/2019 07:57  Glucose-Capillary Latest Ref Range: 70 - 99 mg/dL 113 (H) 108 (H) 100 (H)   Diabetes history: Type 2 DM Outpatient Diabetes medications: Glipizide 10 mg BID Current orders for Inpatient glycemic control: IV insulin  Inpatient Diabetes Program Recommendations:    Noted consult.  When ready to transition consider: -Levemir 8 units two hours prior to discontinuation of IV insulin, then QD to follow. -Novolog 1-3 units Q4H.  Thanks, Bronson Curb, MSN, RNC-OB Diabetes Coordinator [phone] (8a-5p)

## 2019-09-05 NOTE — Procedures (Addendum)
Patient Name: Zoe Clarke  MRN: [id number]  Epilepsy Attending: Lora Havens  Referring Physician/Provider: Merlene Laughter, PA Duration: 08/29/2019 1708 to 09/05/2019 1708  Patient history: 69yo F s/p cardiac arrest on TTM. EEG to evaluate for seizures.  Level of alertness: comatose  AEDs during EEG study: Propofol, keppra  Technical aspects: This EEG study was done with scalp electrodes positioned according to the 10-20 International system of electrode placement. Electrical activity was acquired at a sampling rate of 500Hz  and reviewed with a high frequency filter of 70Hz  and a low frequency filter of 1Hz . EEG data were recorded continuously and digitally stored.   Description: EEG showed continuous generalized burst suppression with highy epileptiform bursts of generalized spike/polyspike and wave. EEG was not reactive to tactile stimulation. Hyperventilation and photic stimulation were not performed.     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      [name]

## 2019-09-05 NOTE — Progress Notes (Signed)
[facility] to make a referral because pt will be rewarmed soon. The rep at [place] [facility] stated he would do a chart review and follow up.  The referral number is [phone]. Irven [place], RN

## 2019-09-05 NOTE — Progress Notes (Signed)
CRITICAL VALUE ALERT  Critical Value:  ABG: 7.497/28.3/81/22.1  Date & Time Notied:  09/05/2019 @ 0400  Provider Notified: Dr Lucile Shutters  Orders Received/Actions taken: vent changes made per order

## 2019-09-05 NOTE — Progress Notes (Signed)
[facility] rep called back for update regarding the referral made earlier. Per [facility] pt will only be a candidate for organ donation under brain death circumstances. RN is to call back with either a cardiac time of death OR in the event that brain death testing is preformed.

## 2019-09-05 NOTE — Progress Notes (Signed)
tech maint fpi1, f7, f4, a1. tech fixed p7 no skin breakdown noted

## 2019-09-06 ENCOUNTER — Inpatient Hospital Stay (HOSPITAL_COMMUNITY): Payer: PPO

## 2019-09-06 LAB — CBC
HCT: 23.6 % — ABNORMAL LOW (ref 36.0–46.0)
Hemoglobin: 7.8 g/dL — ABNORMAL LOW (ref 12.0–15.0)
MCH: 27.4 pg (ref 26.0–34.0)
MCHC: 33.1 g/dL (ref 30.0–36.0)
MCV: 82.8 fL (ref 80.0–100.0)
Platelets: 118 10*3/uL — ABNORMAL LOW (ref 150–400)
RBC: 2.85 MIL/uL — ABNORMAL LOW (ref 3.87–5.11)
RDW: 15.7 % — ABNORMAL HIGH (ref 11.5–15.5)
WBC: 11.9 10*3/uL — ABNORMAL HIGH (ref 4.0–10.5)
nRBC: 0.2 % (ref 0.0–0.2)

## 2019-09-06 LAB — BASIC METABOLIC PANEL
Anion gap: 14 (ref 5–15)
Anion gap: 12 (ref 5–15)
Anion gap: 10 (ref 5–15)
Anion gap: 10 (ref 5–15)
BUN: 49 mg/dL — ABNORMAL HIGH (ref 8–23)
BUN: 49 mg/dL — ABNORMAL HIGH (ref 8–23)
BUN: 51 mg/dL — ABNORMAL HIGH (ref 8–23)
BUN: 49 mg/dL — ABNORMAL HIGH (ref 8–23)
CO2: 22 mmol/L (ref 22–32)
CO2: 26 mmol/L (ref 22–32)
CO2: 28 mmol/L (ref 22–32)
CO2: 28 mmol/L (ref 22–32)
Calcium: 7.4 mg/dL — ABNORMAL LOW (ref 8.9–10.3)
Calcium: 7.6 mg/dL — ABNORMAL LOW (ref 8.9–10.3)
Calcium: 7.5 mg/dL — ABNORMAL LOW (ref 8.9–10.3)
Calcium: 7.6 mg/dL — ABNORMAL LOW (ref 8.9–10.3)
Chloride: 105 mmol/L (ref 98–111)
Chloride: 103 mmol/L (ref 98–111)
Chloride: 102 mmol/L (ref 98–111)
Chloride: 103 mmol/L (ref 98–111)
Creatinine, Ser: 3.66 mg/dL — ABNORMAL HIGH (ref 0.44–1.00)
Creatinine, Ser: 3.83 mg/dL — ABNORMAL HIGH (ref 0.44–1.00)
Creatinine, Ser: 3.84 mg/dL — ABNORMAL HIGH (ref 0.44–1.00)
Creatinine, Ser: 4.02 mg/dL — ABNORMAL HIGH (ref 0.44–1.00)
GFR calc Af Amer: 14 mL/min — ABNORMAL LOW (ref >60)
GFR calc Af Amer: 13 mL/min — ABNORMAL LOW (ref >60)
GFR calc Af Amer: 13 mL/min — ABNORMAL LOW (ref >60)
GFR calc Af Amer: 13 mL/min — ABNORMAL LOW (ref >60)
GFR calc non Af Amer: 12 mL/min — ABNORMAL LOW (ref >60)
GFR calc non Af Amer: 11 mL/min — ABNORMAL LOW (ref >60)
GFR calc non Af Amer: 11 mL/min — ABNORMAL LOW (ref >60)
GFR calc non Af Amer: 11 mL/min — ABNORMAL LOW (ref >60)
Glucose, Bld: 74 mg/dL (ref 70–99)
Glucose, Bld: 92 mg/dL (ref 70–99)
Glucose, Bld: 95 mg/dL (ref 70–99)
Glucose, Bld: 118 mg/dL — ABNORMAL HIGH (ref 70–99)
Potassium: 4.6 mmol/L (ref 3.5–5.1)
Potassium: 4.7 mmol/L (ref 3.5–5.1)
Potassium: 4.4 mmol/L (ref 3.5–5.1)
Potassium: 4.4 mmol/L (ref 3.5–5.1)
Sodium: 141 mmol/L (ref 135–145)
Sodium: 141 mmol/L (ref 135–145)
Sodium: 140 mmol/L (ref 135–145)
Sodium: 141 mmol/L (ref 135–145)

## 2019-09-06 LAB — POCT I-STAT 7, (LYTES, BLD GAS, ICA,H+H)
Acid-Base Excess: 4 mmol/L — ABNORMAL HIGH (ref 0.0–2.0)
Bicarbonate: 26 mmol/L (ref 20.0–28.0)
Calcium, Ion: 1.04 mmol/L — ABNORMAL LOW (ref 1.15–1.40)
HCT: 26 % — ABNORMAL LOW (ref 36.0–46.0)
Hemoglobin: 8.8 g/dL — ABNORMAL LOW (ref 12.0–15.0)
O2 Saturation: 96 %
Patient temperature: 36.9
Potassium: 4.6 mmol/L (ref 3.5–5.1)
Sodium: 142 mmol/L (ref 135–145)
TCO2: 27 mmol/L (ref 22–32)
pCO2 arterial: 26.7 mmHg — ABNORMAL LOW (ref 32.0–48.0)
pH, Arterial: 7.595 — ABNORMAL HIGH (ref 7.350–7.450)
pO2, Arterial: 67 mmHg — ABNORMAL LOW (ref 83.0–108.0)

## 2019-09-06 LAB — GLUCOSE, CAPILLARY
Glucose-Capillary: 120 mg/dL — ABNORMAL HIGH (ref 70–99)
Glucose-Capillary: 68 mg/dL — ABNORMAL LOW (ref 70–99)
Glucose-Capillary: 81 mg/dL (ref 70–99)
Glucose-Capillary: 94 mg/dL (ref 70–99)
Glucose-Capillary: 92 mg/dL (ref 70–99)
Glucose-Capillary: 83 mg/dL (ref 70–99)
Glucose-Capillary: 120 mg/dL — ABNORMAL HIGH (ref 70–99)

## 2019-09-06 LAB — CULTURE, RESPIRATORY W GRAM STAIN: Culture: NORMAL

## 2019-09-06 LAB — PHOSPHORUS: Phosphorus: 4.1 mg/dL (ref 2.5–4.6)

## 2019-09-06 LAB — MAGNESIUM: Magnesium: 2 mg/dL (ref 1.7–2.4)

## 2019-09-06 MED ORDER — PRO-STAT SUGAR FREE PO LIQD
30.0000 mL | Freq: Two times a day (BID) | ORAL | Status: DC
  Administered 2019-09-06 – 2019-09-08 (×4): 30 mL
  Filled 2019-09-06 (×4): qty 30

## 2019-09-06 MED ORDER — VITAL HIGH PROTEIN PO LIQD
1000.0000 mL | ORAL | Status: DC
  Administered 2019-09-06: 1000 mL

## 2019-09-06 NOTE — Progress Notes (Signed)
Hypoglycemic Event  CBG: 68  Treatment: D50 25 mL (12.5 gm)  Symptoms: None  Follow-up CBG: Time: 0327 CBG Result: 120  Possible Reasons for Event: Unknown  Comments/MD notified: hypoglycemic order set    Zoe Clarke, Garnette Scheuermann

## 2019-09-06 NOTE — Progress Notes (Signed)
NAME:  Zoe Clarke, MRN:  [id number], DOB:  [birth year], LOS: 2 ADMISSION DATE:  09/13/2019, CONSULTATION DATE:  08/25/2019 REFERRING MD:  Dr. Billy Fischer, CHIEF COMPLAINT:  Cardiac arrest   Brief History   69 year old female found down by husband at home with eventual cardiac arrest, rhythm pending EMS arrival was asystole.  History of present illness   Zoe Clarke is a 69 year old female with a past medical history significant for sleep apnea, hypertension, GERD, type 2 diabetes, diastolic congestive heart failure, asthma, hyperthermia who was found down by her husband today and suffered cardiac arrest.  History obtained via family as patient is currently unresponsive.  It appears patient husband found her minimally responsive this morning and attempted to arouse her but was not successful and at that time she became pulseless. Per husband then attempted to preform CPR but due to frailty he was unsuccessful. On EMS arrival arrival patient was found in asystole and CPR in combination with ACLS protocol was started, ROSC was obtained after 89mns. Patient cardiac arrested again and ROSC again obtained.  On arrival to ED patient was unresponsive. ABG 7.04 / 61.8 / 143 / 6.7. Lab work significant for hyperkalemia K 6.4, hyperglycemia 333, creatinine 3.25, Troponin 1,887, Lactic acid 7.4, WBC 12.3, hgb 9.3. Chest xray on arrival with consolidation in right upper lobe for which she underwent bedside bronchoscopy while in the ED. PCCM consulted for further management and admission.   Past Medical  History   Past Medical History:  Diagnosis Date   Allergy    Arthritis    Asthma    weazing at night uses inhalers   Bronchitis    history of   Chronic diastolic CHF (congestive heart failure) ([facility] 04/2018   CKD stage 4 due to type 2 diabetes mellitus [place]    Diabetes mellitus without complication (HCC)    Gastric ulcer    GERD (gastroesophageal reflux disease)    History of hiatal  hernia    Hypertension    Osteoporosis    Sleep apnea 09/22/2018    Significant Hospital Events   Admitted 6/18  Consults:  PCCM  Procedures:  6/18 bedside bronchoscope with BAL   Significant Diagnostic Tests:  CRX 6/18 >  1. Endotracheal tube noted with its tip 2 cm above the carina. NG tube noted with its tip below left hemidiaphragm. 2. Prominent atelectasis and consolidation right upper lung. Close follow-up exam suggested to demonstrate resolution to exclude obstructing mass lesion/tumor.  Micro Data:  COVID 6/18 > MRSA PCR 6/18 > Blood culture 6/18 > Urine culture 6/18 BAL culture 6/18  Antimicrobials:  Zosyn 6/18   Interim history/subjective:  Unresponsive on ventilator.  Now in rewarming phase.  Objective   Blood pressure (!) 174/62, pulse 94, temperature 99.1 F (37.3 C), resp. rate (!) 21, height _0  (1.727 m), weight 135.3 kg, SpO2 100 %.    Vent Mode: PRVC FiO2 (%):  [40 %] 40 % Set Rate:  [20 bmp-28 bmp] 20 bmp Vt Set:  [510 mL] 510 mL PEEP:  [5 cmH20] 5 cmH20 Plateau Pressure:  [19 cmH20-25 cmH20] 24 cmH20   Intake/Output Summary (Last 24 hours) at 09/06/2019 1329 Last data filed at 09/06/2019 1100 Gross per 24 hour  Intake 3920.98 ml  Output 280 ml  Net 3640.98 ml   Filed Weights   09/16/2019 1600 09/05/19 0359 09/06/19 0330  Weight: 104.3 kg 132.6 kg 135.3 kg    Examination: General: Chronically ill appearing elderly  female on mechanical  ventilation, in NAD.  Obese. HEENT: ETT, MM pink/moist, pupils non-reactive, sclera non-icteric  Neuro: Sluggish eye opening to stimulation.  No limb response to painful stimulation.  Corneals present.  Pupils reactive.  no gag, no cough. CV: s1s2 regular rate and rhythm, no murmur, rubs, or gallops,  PULM:  Diminished breath sounds, right worse than left, tolerating vent,  GI: soft, bowel sounds active in all 4 quadrants, non-tender, non-distended Extremities: warm/dry, significant generalized  edema    Skin: no rashes or lesions  Resolved Hospital Problem list     Assessment & Plan:   Critically ill due to cardiac arrest of unclear etiology requiring therapeutic temperature management -Now in maintenance phase. -Wean sedation to allow for neuro prognostication. -Current exam is poor indicating poor long-term prognosis.  Critically ill due to  Acute hypoxic respiratory failure in the context of cardiac arrest -Full ventilatory support.  Questionable aspiration pneumonia -Continue Zosyn.  Anoxic ischemic encephalopathy -Wean off sedation to allow for neuro prognostication.    HX of diastolic congestive heart failure  Type 2 diabetes CKD stage 4  Goals of care  Patients family include spouse and son were updated extensively regarding cardiac arrest and now poor neurological exam. Decision was made collectively among family to continue aggressive medical care but make patient a DNR. We will await head CT scan results and if there is evidence of severe anoxic injury family will pursue comfort measures.      Best practice:  Diet: NPO-started tube feeds Pain/Anxiety/Delirium protocol (if indicated): None VAP protocol (if indicated): In place  DVT prophylaxis: Sub heparin GI prophylaxis: PPI Glucose control: SSI Mobility: Bedrest Code Status: Full Family Communication: Updated extensively at bedside on admission.  We will update again tomorrow with an off sedation exam. Disposition: ICU  Labs   CBC: Recent Labs  Lab 08/24/2019 1224 09/06/2019 1309 09/16/2019 1703 09/05/19 0258 09/05/19 0300 09/06/19 0303 09/06/19 0310  WBC 12.3*   12.3*  --   --  14.2*  --  11.9*  --   NEUTROABS 9.2*  --   --   --   --   --   --   HGB 9.2*   9.3*   < > 10.2* 8.3* 9.2* 7.8* 8.8*  HCT 34.1*   34.6*   < > 30.0* 26.6* 27.0* 23.6* 26.0*  MCV 101.8*   102.4*  --   --  88.4  --  82.8  --   PLT 158   156  --   --  113*  --  118*  --    < > = values in this interval not displayed.    Basic  Metabolic Panel: Recent Labs  Lab 09/06/2019 1224 09/07/2019 1309 09/02/2019 1357 08/31/2019 1452 09/05/19 1120 09/05/19 1120 09/05/19 1748 09/05/19 1929 09/05/19 2302 09/06/19 0303 09/06/19 0310  NA 141   < >  --    < > 143   < > 143 141 140 141 142  K 6.4*   < >  --    < > 4.9   < > 4.9 4.7 4.6 4.6 4.6  CL 116*  --   --    < > 109  --  107 107 105 105  --   CO2 12*  --   --    < > 24  --  _0 --   GLUCOSE 345*  --   --    < > 107*  --  96 90 79 74  --  BUN 42*  --   --    < > 47*  --  47* 49* 49* 49*  --   CREATININE 3.25*  --   --    < > 3.38*  --  3.44* 3.57* 3.60* 3.66*  --   CALCIUM 7.9*  --   --    < > 7.9*  --  8.0* 7.8* 7.5* 7.4*  --   MG 2.8*  --  2.5*  --   --   --   --   --   --   --   --   PHOS  --   --  7.4*  --   --   --   --   --   --   --   --    < > = values in this interval not displayed.   GFR: Estimated Creatinine Clearance: 21.8 mL/min (A) (by C-G formula based on SCr of 3.66 mg/dL (H)). Recent Labs  Lab 08/20/2019 1224 08/21/2019 1651 09/05/19 0258 09/06/19 0303  WBC 12.3*   12.3*  --  14.2* 11.9*  LATICACIDVEN 7.4* 5.4*  --   --     Liver Function Tests: Recent Labs  Lab 09/14/2019 1357  AST 736*  ALT 627*  ALKPHOS 204*  BILITOT 1.5*  PROT 5.6*  ALBUMIN 2.4*   No results for input(s): LIPASE, AMYLASE in the last 168 hours. No results for input(s): AMMONIA in the last 168 hours.  ABG    Component Value Date/Time   PHART 7.595 (H) 09/06/2019 0310   PCO2ART 26.7 (L) 09/06/2019 0310   PO2ART 67 (L) 09/06/2019 0310   HCO3 26.0 09/06/2019 0310   TCO2 27 09/06/2019 0310   ACIDBASEDEF 1.0 09/05/2019 0300   O2SAT 96.0 09/06/2019 0310     Coagulation Profile: Recent Labs  Lab 09/12/2019 1651 09/05/19 0258  INR 1.7* 1.7*    Cardiac Enzymes: Recent Labs  Lab 08/31/2019 1651  CKTOTAL 479*    HbA1C: Hgb A1c MFr Bld  Date/Time Value Ref Range Status  08/21/2019 12:24 PM 6.4 (H) 4.8 - 5.6 % Final    Comment:    REPEATED TO  VERIFY (NOTE) Pre diabetes:          5.7%-6.4%  Diabetes:              >6.4%  Glycemic control for   <7.0% adults with diabetes   06/25/2019 09:14 AM 5.4 4.8 - 5.6 % Final    Comment:    (NOTE)         Prediabetes: 5.7 - 6.4         Diabetes: >6.4         Glycemic control for adults with diabetes: <7.0     CBG: Recent Labs  Lab 09/05/19 2304 09/06/19 0308 09/06/19 0327 09/06/19 0804 09/06/19 1137  GLUCAP 82 68* 120* 81 94   CRITICAL CARE Performed by: Kipp Brood   Total critical care time: 35 minutes  Critical care time was exclusive of separately billable procedures and treating other patients.  Critical care was necessary to treat or prevent imminent or life-threatening deterioration.  Critical care was time spent personally by me on the following activities: development of treatment plan with patient and/or surrogate as well as nursing, discussions with consultants, evaluation of patient's response to treatment, examination of patient, obtaining history from patient or surrogate, ordering and performing treatments and interventions, ordering and review of laboratory studies, ordering and review of radiographic studies, pulse oximetry, re-evaluation  of patient's condition and participation in multidisciplinary rounds.  Kipp Brood, MD [facility] ICU Physician [place]  Pager: [phone] Mobile: [phone] After hours: [phone].    .09/06/2019, 1:29 PM

## 2019-09-06 NOTE — Progress Notes (Signed)
LTM EEG discontinued - no skin breakdown at unhook.   

## 2019-09-06 NOTE — Progress Notes (Addendum)
Neurology Progress Note   S:// No acute changes overnight.   O:// Current vital signs: BP (!) 174/62   Pulse 90   Temp 98.2 F (36.8 C)   Resp 20   Ht 5' 8" (1.727 m)   Wt 135.3 kg   SpO2 100%   BMI 45.35 kg/m  Vital signs in last 24 hours: Temp:  [96.3 F (35.7 C)-98.6 F (37 C)] 98.2 F (36.8 C) (06/20 1000) Pulse Rate:  [84-94] 90 (06/20 1000) Resp:  [18-30] 20 (06/20 1000) BP: (174)/(62) 174/62 (06/20 0527) SpO2:  [95 %-100 %] 100 % (06/20 1000) Arterial Line BP: (124-185)/(55-76) 146/55 (06/20 1000) FiO2 (%):  [40 %] 40 % (06/20 0818) Weight:  [135.3 kg] 135.3 kg (06/20 0330) General: Sedated intubated HEENT: Normocephalic atraumatic CV: Regular rate rhythm Respiratory: Vented Extremities: Trace edema Abdomen: Obese, nontender Neurological exam Sedated with propofol. Right pupil has the opacity. Left pupil is if anything very sluggishly reactive. Breathing with the vent No cough or gag Negative oculocephalics No corneal reflexes bilaterally No spontaneous movement No movement to noxious stimulation  Medications  Current Facility-Administered Medications:  .  0.9 %  sodium chloride infusion, , Intravenous, Continuous, Candee Furbish, MD .  Place/Maintain arterial line, , , Until Discontinued **AND** 0.9 %  sodium chloride infusion, , Intra-arterial, PRN, Candee Furbish, MD .  0.9 %  sodium chloride infusion, , Intravenous, Continuous, Candee Furbish, MD, Stopped at 09/06/19 [phone] .  chlorhexidine gluconate (MEDLINE KIT) (PERIDEX) 0.12 % solution 15 mL, 15 mL, Mouth Rinse, BID, Candee Furbish, MD, 15 mL at 09/06/19 0759 .  Chlorhexidine Gluconate Cloth 2 % PADS 6 each, 6 each, Topical, Q0600, Candee Furbish, MD, 6 each at 09/05/19 1728 .  dextrose 5 % solution, , Intravenous, Continuous, Agarwala, Ravi, MD, Last Rate: 20 mL/hr at 09/06/19 0900, Rate Verify at 09/06/19 0900 .  dextrose 5 %-0.45 % sodium chloride infusion, , Intravenous, Continuous,  Candee Furbish, MD, Stopped at 09/05/19 1413 .  dextrose 50 % solution 0-50 mL, 0-50 mL, Intravenous, PRN, Candee Furbish, MD, 25 mL at 09/06/19 0311 .  docusate sodium (COLACE) capsule 100 mg, 100 mg, Oral, BID PRN, Merlene Laughter F, NP .  fentaNYL (SUBLIMAZE) bolus via infusion 25 mcg, 25 mcg, Intravenous, Q15 min PRN, Candee Furbish, MD .  fentaNYL 257mg in NS 256m(1077mml) infusion-PREMIX, 25-200 mcg/hr, Intravenous, Continuous, SmiCandee FurbishD .  heparin injection 5,000 Units, 5,000 Units, Subcutaneous, Q8H, DavMerlene Laughter NP, 5,000 Units at 09/06/19 0503 .  hydrALAZINE (APRESOLINE) injection 10 mg, 10 mg, Intravenous, Q4H PRN, DavMerlene Laughter NP, 10 mg at 09/06/19 0527 .  insulin aspart (novoLOG) injection 2-6 Units, 2-6 Units, Subcutaneous, Q4H, Eubanks, Katalina M, NP .  insulin detemir (LEVEMIR) injection 8 Units, 8 Units, Subcutaneous, Daily, EubOmar PersonP, 8 Units at 09/05/19 1445 .  levETIRAcetam (KEPPRA) IVPB 500 mg/100 mL premix, 500 mg, Intravenous, Q12H, LinKerney ElbeD, Last Rate: 400 mL/hr at 09/06/19 0909, 500 mg at 09/06/19 0909 .  LORazepam (ATIVAN) injection 1 mg, 1 mg, Intravenous, Q1H PRN, DavMerlene Laughter NP, 1 mg at 09/05/19 0428 .  MEDLINE mouth rinse, 15 mL, Mouth Rinse, 10 times per day, SmiCandee FurbishD, 15 mL at 09/06/19 0909 .  norepinephrine (LEVOPHED) 4mg60m 250mL54mmix infusion, 0-40 mcg/min, Intravenous, Continuous, Schlossman, Erin, MD, Last Rate: 7.5 mL/hr at 09/03/2019 1331, 2 mcg/min at 09/02/2019 1331 .  pantoprazole (PROTONIX) injection 40  mg, 40 mg, Intravenous, QHS, Merlene Laughter F, NP, 40 mg at 09/05/19 2114 .  piperacillin-tazobactam (ZOSYN) IVPB 3.375 g, 3.375 g, Intravenous, Once **FOLLOWED BY** piperacillin-tazobactam (ZOSYN) IVPB 3.375 g, 3.375 g, Intravenous, Q8H, Candee Furbish, MD, Last Rate: 12.5 mL/hr at 09/06/19 0900, Rate Verify at 09/06/19 0900 .  polyethylene glycol (MIRALAX / GLYCOLAX) packet 17 g, 17 g, Per  Tube, Daily PRN, Candee Furbish, MD .  propofol (DIPRIVAN) 1000 MG/100ML infusion, 5-80 mcg/kg/min, Intravenous, Titrated, Candee Furbish, MD, Last Rate: 15.65 mL/hr at 09/06/19 0900, 25 mcg/kg/min at 09/06/19 0900 .  sodium bicarbonate 150 mEq in sterile water 1,000 mL infusion, , Intravenous, Continuous, Candee Furbish, MD, Last Rate: 125 mL/hr at 09/06/19 0910, New Bag at 09/06/19 0910 .  sodium zirconium cyclosilicate (LOKELMA) packet 10 g, 10 g, Per Tube, TID, Candee Furbish, MD, 10 g at 09/05/19 2114 .  vancomycin (VANCOREADY) IVPB 1500 mg/300 mL, 1,500 mg, Intravenous, Q48H, Bertis Ruddy, [facility] CBC    Component Value Date/Time   WBC 11.9 (H) 09/06/2019 0303   RBC 2.85 (L) 09/06/2019 0303   HGB 8.8 (L) 09/06/2019 0310   HGB 12.2 09/10/2017 0942   HCT 26.0 (L) 09/06/2019 0310   HCT 38.7 09/10/2017 0942   PLT 118 (L) 09/06/2019 0303   PLT 290 09/10/2017 0942   MCV 82.8 09/06/2019 0303   MCV 81.6 01/13/2015 1506   MCH 27.4 09/06/2019 0303   MCHC 33.1 09/06/2019 0303   RDW 15.7 (H) 09/06/2019 0303   LYMPHSABS 2.5 08/19/2019 1224   MONOABS 0.6 08/21/2019 1224   EOSABS 0.0 09/12/2019 1224   BASOSABS 0.0 08/26/2019 1224    CMP     Component Value Date/Time   NA 142 09/06/2019 0310   NA 142 11/21/2018 0812   K 4.6 09/06/2019 0310   CL 105 09/06/2019 0303   CO2 22 09/06/2019 0303   GLUCOSE 74 09/06/2019 0303   BUN 49 (H) 09/06/2019 0303   BUN 29 (H) 11/21/2018 0812   CREATININE 3.66 (H) 09/06/2019 0303   CREATININE 1.81 (H) 08/25/2015 1545   CALCIUM 7.4 (L) 09/06/2019 0303   PROT 5.6 (L) 09/12/2019 1357   ALBUMIN 2.4 (L) 09/12/2019 1357   AST 736 (H) 08/20/2019 1357   ALT 627 (H) 08/18/2019 1357   ALKPHOS 204 (H) 08/27/2019 1357   BILITOT 1.5 (H) 08/20/2019 1357   GFRNONAA 12 (L) 09/06/2019 0303   GFRNONAA 29 (L) 08/25/2015 1545   GFRAA 14 (L) 09/06/2019 0303   GFRAA 34 (L) 08/25/2015 1545    glycosylated hemoglobin  Lipid Panel     Component Value  Date/Time   CHOL 103 12/20/2018 1739   TRIG 52 09/16/2019 1651   HDL 35 (L) 12/20/2018 1739   CHOLHDL 2.9 12/20/2018 1739   VLDL 14 12/20/2018 1739   LDLCALC 54 12/20/2018 1739   Continuous EEG with evidence of generalized epileptogenicity as well as profound diffuse encephalopathy most likely secondary to anoxic hypoxic brain injury.  Imaging I have reviewed images in epic and the results pertinent to this consultation are: On arrival CT head with no acute changes.  Assessment:  69 year old found down at home with cardiac arrest, asystole, CPR with ROSC in 24 minutes followed by another cardiac arrest requiring CPR prior to ROSC. Unresponsive on arrival in the ED.  Myoclonic jerking noted.  EEG also noted myoclonic status epilepticus Loaded with Keppra.  Propofol infusion started.  With above medications-the myoclonic status appears to have resolved but  continues to have generalized epileptogenic city and profound slowing suggestive of hypoxic anoxic brain injury. On today's exam, I could not get any of the brainstem reflexes. She is being rewarmed.  She will be off of TTM tomorrow morning. She needs a post TTM exam-48 to 72 hours after normothermia for prognostication. I suspect that she is going to progress to brain death based on her current exam and presentation.  Recommendations: Discontinue LTM TTM management per primary team Continue propofol for now-it can be held for exams or discontinued after the TTM protocol is completed. Continue serial exams Extremely poor chances for neurologically meaningful recovery based on the presentation as well as current exam and EEG findings. If needed, can do an MRI to assess for the extent of hypoxic/anoxic brain injury.   -- Amie Portland, MD [facility] Pager: [phone] If 7pm to 7am, please call on call as listed on AMION.  CRITICAL CARE ATTESTATION Performed by: Amie Portland, MD Total critical care time: 30  minutes Critical care time was exclusive of separately billable procedures and treating other patients and/or supervising APPs/Residents/Students Critical care was necessary to treat or prevent imminent or life-threatening deterioration due to anoxic brain injury This patient is critically ill and at significant risk for neurological worsening and/or death and care requires constant monitoring. Critical care was time spent personally by me on the following activities: development of treatment plan with patient and/or surrogate as well as nursing, discussions with consultants, evaluation of patient's response to treatment, examination of patient, obtaining history from patient or surrogate, ordering and performing treatments and interventions, ordering and review of laboratory studies, ordering and review of radiographic studies, pulse oximetry, re-evaluation of patient's condition, participation in multidisciplinary rounds and medical decision making of high complexity in the care of this patient.

## 2019-09-06 NOTE — Progress Notes (Signed)
CRITICAL VALUE ALERT  Critical Value:  ABG 7.59/26.7/67/26  Date & Time Notied:  09/06/19 @ 9021  Provider Notified: Warren Lacy MD  Orders Received/Actions taken: Warren Lacy made aware

## 2019-09-06 NOTE — Progress Notes (Signed)
eLink Physician-Brief Progress Note Patient Name: Zoe Clarke DOB: 09/05/1950 MRN: [id number]   Date of Service  09/06/2019  HPI/Events of Note  Notified of ABG 7.59/27/67 On volume AC 28/450/40%/5 PEEP Patient not breathing over the set rate  eICU Interventions  Decrease rate to 20 Discussed with bedside RN     Intervention Category Major Interventions: Respiratory failure - evaluation and management  Judd Lien 09/06/2019, 3:41 AM

## 2019-09-06 NOTE — Procedures (Addendum)
Patient Name:Zoe Clarke EHO:[id number] Epilepsy Attending:Shenay Torti Barbra Sarks Referring [place], [place] Duration:09/05/2019 1708 to 09/06/2019 1241  Patient history:68yo F s/p cardiac arrest on TTM. EEG to evaluate for seizures.  Level of alertness:comatose  AEDs during EEG study:Propofol, keppra  Technical aspects: This EEG study was done with scalp electrodes positioned according to the 10-20 International system of electrode placement. Electrical activity was acquired at a sampling rate of 500Hz  and reviewed with a high frequency filter of 70Hz  and a low frequency filter of 1Hz . EEG data were recorded continuously and digitally stored.   Description: EEG initially showed continuous generalizedburst suppression with highy epileptiform bursts of generalized spike/polyspike and wave.As patient was rewarmed, periods of suppression became shorter lasting 2-4 seconds and with generalized spikes and wave lasting 0.5 seconds. EEG was not reactive to tactile stimulation.Hyperventilation and photic stimulation were not performed.   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   Liberato Stansbery Barbra Sarks

## 2019-09-07 ENCOUNTER — Inpatient Hospital Stay (HOSPITAL_COMMUNITY): Payer: PPO

## 2019-09-07 DIAGNOSIS — G9340 Encephalopathy, unspecified: Secondary | ICD-10-CM

## 2019-09-07 LAB — BASIC METABOLIC PANEL
Anion gap: 11 (ref 5–15)
Anion gap: 14 (ref 5–15)
Anion gap: 11 (ref 5–15)
BUN: 53 mg/dL — ABNORMAL HIGH (ref 8–23)
BUN: 54 mg/dL — ABNORMAL HIGH (ref 8–23)
BUN: 58 mg/dL — ABNORMAL HIGH (ref 8–23)
CO2: 27 mmol/L (ref 22–32)
CO2: 26 mmol/L (ref 22–32)
CO2: 27 mmol/L (ref 22–32)
Calcium: 7.3 mg/dL — ABNORMAL LOW (ref 8.9–10.3)
Calcium: 7.6 mg/dL — ABNORMAL LOW (ref 8.9–10.3)
Calcium: 7.7 mg/dL — ABNORMAL LOW (ref 8.9–10.3)
Chloride: 103 mmol/L (ref 98–111)
Chloride: 100 mmol/L (ref 98–111)
Chloride: 103 mmol/L (ref 98–111)
Creatinine, Ser: 4.03 mg/dL — ABNORMAL HIGH (ref 0.44–1.00)
Creatinine, Ser: 4.29 mg/dL — ABNORMAL HIGH (ref 0.44–1.00)
Creatinine, Ser: 4.34 mg/dL — ABNORMAL HIGH (ref 0.44–1.00)
GFR calc Af Amer: 13 mL/min — ABNORMAL LOW (ref >60)
GFR calc Af Amer: 12 mL/min — ABNORMAL LOW (ref >60)
GFR calc Af Amer: 11 mL/min — ABNORMAL LOW (ref >60)
GFR calc non Af Amer: 11 mL/min — ABNORMAL LOW (ref >60)
GFR calc non Af Amer: 10 mL/min — ABNORMAL LOW (ref >60)
GFR calc non Af Amer: 10 mL/min — ABNORMAL LOW (ref >60)
Glucose, Bld: 117 mg/dL — ABNORMAL HIGH (ref 70–99)
Glucose, Bld: 118 mg/dL — ABNORMAL HIGH (ref 70–99)
Glucose, Bld: 121 mg/dL — ABNORMAL HIGH (ref 70–99)
Potassium: 4.4 mmol/L (ref 3.5–5.1)
Potassium: 4.3 mmol/L (ref 3.5–5.1)
Potassium: 4.4 mmol/L (ref 3.5–5.1)
Sodium: 141 mmol/L (ref 135–145)
Sodium: 140 mmol/L (ref 135–145)
Sodium: 141 mmol/L (ref 135–145)

## 2019-09-07 LAB — HEPATIC FUNCTION PANEL
ALT: 810 U/L — ABNORMAL HIGH (ref 0–44)
AST: 228 U/L — ABNORMAL HIGH (ref 15–41)
Albumin: 2.2 g/dL — ABNORMAL LOW (ref 3.5–5.0)
Alkaline Phosphatase: 146 U/L — ABNORMAL HIGH (ref 38–126)
Bilirubin, Direct: 0.7 mg/dL — ABNORMAL HIGH (ref 0.0–0.2)
Indirect Bilirubin: 0.7 mg/dL (ref 0.3–0.9)
Total Bilirubin: 1.4 mg/dL — ABNORMAL HIGH (ref 0.3–1.2)
Total Protein: 5.9 g/dL — ABNORMAL LOW (ref 6.5–8.1)

## 2019-09-07 LAB — CBC
HCT: 23.9 % — ABNORMAL LOW (ref 36.0–46.0)
Hemoglobin: 7.7 g/dL — ABNORMAL LOW (ref 12.0–15.0)
MCH: 27.4 pg (ref 26.0–34.0)
MCHC: 32.2 g/dL (ref 30.0–36.0)
MCV: 85.1 fL (ref 80.0–100.0)
Platelets: 125 10*3/uL — ABNORMAL LOW (ref 150–400)
RBC: 2.81 MIL/uL — ABNORMAL LOW (ref 3.87–5.11)
RDW: 15.9 % — ABNORMAL HIGH (ref 11.5–15.5)
WBC: 11.8 10*3/uL — ABNORMAL HIGH (ref 4.0–10.5)
nRBC: 0 % (ref 0.0–0.2)

## 2019-09-07 LAB — MAGNESIUM
Magnesium: 2.1 mg/dL (ref 1.7–2.4)
Magnesium: 2.2 mg/dL (ref 1.7–2.4)

## 2019-09-07 LAB — PHOSPHORUS
Phosphorus: 4.6 mg/dL (ref 2.5–4.6)
Phosphorus: 5.2 mg/dL — ABNORMAL HIGH (ref 2.5–4.6)

## 2019-09-07 LAB — GLUCOSE, CAPILLARY
Glucose-Capillary: 128 mg/dL — ABNORMAL HIGH (ref 70–99)
Glucose-Capillary: 119 mg/dL — ABNORMAL HIGH (ref 70–99)
Glucose-Capillary: 135 mg/dL — ABNORMAL HIGH (ref 70–99)
Glucose-Capillary: 123 mg/dL — ABNORMAL HIGH (ref 70–99)
Glucose-Capillary: 114 mg/dL — ABNORMAL HIGH (ref 70–99)

## 2019-09-07 LAB — TRIGLYCERIDES: Triglycerides: 102 mg/dL (ref <150)

## 2019-09-07 LAB — AMMONIA: Ammonia: 10 umol/L (ref 9–35)

## 2019-09-07 MED ORDER — VITAL HIGH PROTEIN PO LIQD
1000.0000 mL | ORAL | Status: DC
  Administered 2019-09-07: 1000 mL

## 2019-09-07 NOTE — Progress Notes (Signed)
Initial Nutrition Assessment  RD working remotely.  DOCUMENTATION CODES:   Morbid obesity  INTERVENTION:   Tube feeding via OG tube: - Vital High Protein @ 50 ml/hr (1200 ml/day) - Pro-stat 30 ml BID  Tube feeding regimen provides 1400 kcal, 135 grams of protein, and 1003 ml of H2O.   Tube feeding regimen and current propofol provides 1730 total kcal (100% of needs).  NUTRITION DIAGNOSIS:   Inadequate oral intake related to inability to eat as evidenced by NPO status.  GOAL:   Provide needs based on ASPEN/SCCM guidelines  MONITOR:   Vent status, Labs, Weight trends, TF tolerance, I & O's  REASON FOR ASSESSMENT:   Ventilator, Consult Enteral/tube feeding initiation and management  ASSESSMENT:   69 year old female who presented on 6/18 after being found down by husband at home and subsequently suffering a cardiac arrest. PMH of sleep apnea, HTN, GERD, T2DM, CHF, asthma, CKD stage 4. Pt intubated in the field and TTM initiated.  Received consult for tube feeding initiation and management.  Pt is now in maintenance phase. Per CCM, current exam is poor indicating poor long-term prognosis.  OG tube in place with TF currently infusing.  Current TF: Vital High Protein @ 40 ml/hr, Pro-stat 30 ml BID  EDW: 132.6 kg  Per RN edema assessment, pt with mild pitting generalized edema.  Patient is currently intubated on ventilator support MV: 17.9 L/min Temp (24hrs), Avg:98.4 F (36.9 C), Min:97.2 F (36.2 C), Max:99.1 F (37.3 C) BP (a-line): 175/61 MAP (a-line): 93  Drips: Propofol: 12.5 ml/hr (provides 330 kcal daily from lipid)  Medications reviewed and include: SSI q 4 hours, Levemir 8 units daily, protonix, Keppra, IV abx  Labs reviewed: hemoglobin 7.7 CBG's: 83-128 x 24 hours  UOP: 335 ml x 24 hours I/O's: +10.9 L since admit  NUTRITION - FOCUSED PHYSICAL EXAM:  Unable to complete at this time. RD working remotely.  Diet Order:   Diet Order             Diet NPO time specified  Diet effective now                 EDUCATION NEEDS:   No education needs have been identified at this time  Skin:  Skin Assessment: Reviewed RN Assessment  Last BM:  09/07/19 large type 7 via rectal tube  Height:   Ht Readings from Last 1 Encounters:  09/13/2019 5\' 8"  (1.727 m)    Weight:   Wt Readings from Last 1 Encounters:  09/07/19 135.3 kg    Ideal Body Weight:  63.6 kg  BMI:  Body mass index is 45.35 kg/m.  Estimated Nutritional Needs:   Kcal:  9702-6378  Protein:  127-159 grams  Fluid:  >/= 1.5 L    Gaynell Face, MS, RD, LDN Inpatient Clinical Dietitian Pager: [phone] Weekend/After Hours: [phone]

## 2019-09-07 NOTE — Progress Notes (Addendum)
NAME:  Zoe Clarke, MRN:  [id number], DOB:  [birth year], LOS: 3 ADMISSION DATE:  08/27/2019, CONSULTATION DATE:  08/30/2019 REFERRING MD:  Dr. Billy Fischer, CHIEF COMPLAINT:  Cardiac arrest   Brief History   69 year old female found down by husband at home with eventual cardiac arrest, rhythm pending EMS arrival was asystole.  History of present illness   Zoe Clarke is a 69 year old female with a past medical history significant for sleep apnea, hypertension, GERD, type 2 diabetes, diastolic congestive heart failure, asthma, hyperthermia who was found down by her husband today and suffered cardiac arrest.  History obtained via family as patient is currently unresponsive.  It appears patient husband found her minimally responsive this morning and attempted to arouse her but was not successful and at that time she became pulseless. Per husband then attempted to preform CPR but due to frailty he was unsuccessful. On EMS arrival arrival patient was found in asystole and CPR in combination with ACLS protocol was started, ROSC was obtained after 43mns. Patient cardiac arrested again and ROSC again obtained.  On arrival to ED patient was unresponsive. ABG 7.04 / 61.8 / 143 / 6.7. Lab work significant for hyperkalemia K 6.4, hyperglycemia 333, creatinine 3.25, Troponin 1,887, Lactic acid 7.4, WBC 12.3, hgb 9.3. Chest xray on arrival with consolidation in right upper lobe for which she underwent bedside bronchoscopy while in the ED. PCCM consulted for further management and admission.   Past Medical  History   Past Medical History:  Diagnosis Date  . Allergy   . Arthritis   . Asthma    weazing at night uses inhalers  . Bronchitis    history of  . Chronic diastolic CH[place] (congestive heart failure) [place] [place]  . CKD stage 4 due to type 2 diabetes mellitus ([facility] [place]   . Diabetes mellitus without complication ([facility]   . Gastric ulcer   . GERD (gastroesophageal reflux disease)   . History of hiatal  hernia   . Hypertension   . Osteoporosis   . Sleep apnea 09/22/2018    Significant Hospital Events   Admitted 6/18  Consults:  PCCM  Procedures:  6/18 bedside bronchoscope with BAL   Significant Diagnostic Tests:  CRX 6/18 >  1. Endotracheal tube noted with its tip 2 cm above the carina. NG tube noted with its tip below left hemidiaphragm. 2. Prominent atelectasis and consolidation right upper lung. Close follow-up exam suggested to demonstrate resolution to exclude obstructing mass lesion/tumor.  Micro Data:  COVID 6/18 > MRSA PCR 6/18 > Blood culture 6/18 > Urine culture 6/18 BAL culture 6/18  Antimicrobials:  Zosyn 6/18 >> Cefepime 6/18 Vancomycin 6/18  Interim history/subjective:  Unresponsive on ventilator.  Now in rewarming phase. Breathing above the vent  Objective   Blood pressure (!) 180/66, pulse 93, temperature 99 F (37.2 C), resp. rate (!) 25, height _0  (1.727 m), weight 135.3 kg, SpO2 99 %.    Vent Mode: PRVC FiO2 (%):  [40 %] 40 % Set Rate:  [20 bmp] 20 bmp Vt Set:  [510 mL] 510 mL PEEP:  [5 cmH20] 5 cmH20 Plateau Pressure:  [20 cmH20-27 cmH20] 27 cmH20   Intake/Output Summary (Last 24 hours) at 09/07/2019 0842 Last data filed at 09/07/2019 0800 Gross per 24 hour  Intake 2790.21 ml  Output 335 ml  Net 2455.21 ml   Filed Weights   09/05/19 0359 09/06/19 0330 09/07/19 0500  Weight: 132.6 kg 135.3 kg 135.3 kg    Examination:  General:  Elderly lady, on mechanical ventilation, obese HEENT: Endotracheal tube in place, moist oral mucosa Neuro: No response to verbal stimuli, no response to pain, no gag, no cough, breathing above the vent  CV: S1-S2 appreciated PULM: Clear breath sounds anteriorly GI: Soft, bowel sounds appreciated Extremities: warm/dry, significant generalized  edema  Skin: no rashes or lesions  Resolved Hospital Problem list     Assessment & Plan:   Critically ill due to cardiac arrest of unclear etiology requiring  therapeutic temperature management -Now in maintenance phase. -Wean sedation to allow for neuro prognostication-currently on propofol -Current exam is poor indicating poor long-term prognosis.  Critically ill due to  Acute hypoxic respiratory failure in the context of cardiac arrest -Full ventilatory support.  Questionable aspiration pneumonia -Continue Zosyn. -We will repeat chest x-ray -No fever, no leukocytosis, will plan to start antibiotics if x-ray does not reveal infiltrate  Anoxic ischemic encephalopathy -Wean off sedation to allow for neuro prognostication.    HX of diastolic congestive heart failure  Type 2 diabetes CKD stage 4  Goals of care  By Dr. Lynetta Mare: Patients family include spouse and son were updated extensively regarding cardiac arrest and now poor neurological exam. Decision was made collectively among family to continue aggressive medical care but make patient a DNR. We will await head CT scan results and if there is evidence of severe anoxic injury family will pursue comfort measures.       Best practice:  Diet: Continue tube feeds Pain/Anxiety/Delirium protocol (if indicated): None VAP protocol (if indicated): In place  DVT prophylaxis: Sub heparin GI prophylaxis: PPI Glucose control: SSI Mobility: Bedrest Code Status: Full Family Communication: Will update Disposition: ICU  Labs   CBC: Recent Labs  Lab 08/19/2019 1224 09/12/2019 1309 09/05/19 0258 09/05/19 0300 09/06/19 0303 09/06/19 0310 09/07/19 0306  WBC 12.3*  12.3*  --  14.2*  --  11.9*  --  11.8*  NEUTROABS 9.2*  --   --   --   --   --   --   HGB 9.2*  9.3*   < > 8.3* 9.2* 7.8* 8.8* 7.7*  HCT 34.1*  34.6*   < > 26.6* 27.0* 23.6* 26.0* 23.9*  MCV 101.8*  102.4*  --  88.4  --  82.8  --  85.1  PLT 158  156  --  113*  --  118*  --  125*   < > = values in this interval not displayed.    Basic Metabolic Panel: Recent Labs  Lab 08/24/2019 1224 08/22/2019 1309 08/19/2019 1357  08/24/2019 1452 09/06/19 0303 09/06/19 0303 09/06/19 0310 09/06/19 1337 09/06/19 1740 09/06/19 2258 09/07/19 0306  NA 141   < >  --    < > 141   < > 142 141 140 141 141  K 6.4*   < >  --    < > 4.6   < > 4.6 4.7 4.4 4.4 4.4  CL 116*  --   --    < > 105  --   --  103 102 103 103  CO2 12*  --   --    < > 22  --   --  _0 GLUCOSE 345*  --   --    < > 74  --   --  92 95 118* 117*  BUN 42*  --   --    < > 49*  --   --  49* 51* 49* 53*  CREATININE 3.25*  --   --    < > 3.66*  --   --  3.83* 3.84* 4.02* 4.03*  CALCIUM 7.9*  --   --    < > 7.4*  --   --  7.6* 7.5* 7.6* 7.3*  MG 2.8*  --  2.5*  --   --   --   --   --  2.0  --  2.1  PHOS  --   --  7.4*  --   --   --   --   --  4.1  --  4.6   < > = values in this interval not displayed.   GFR: Estimated Creatinine Clearance: 19.8 mL/min (A) (by C-G formula based on SCr of 4.03 mg/dL (H)). Recent Labs  Lab 08/28/2019 1224 09/11/2019 1651 09/05/19 0258 09/06/19 0303 09/07/19 0306  WBC 12.3*  12.3*  --  14.2* 11.9* 11.8*  LATICACIDVEN 7.4* 5.4*  --   --   --     Liver Function Tests: Recent Labs  Lab 08/31/2019 1357  AST 736*  ALT 627*  ALKPHOS 204*  BILITOT 1.5*  PROT 5.6*  ALBUMIN 2.4*   No results for input(s): LIPASE, AMYLASE in the last 168 hours. No results for input(s): AMMONIA in the last 168 hours.  ABG    Component Value Date/Time   PHART 7.595 (H) 09/06/2019 0310   PCO2ART 26.7 (L) 09/06/2019 0310   PO2ART 67 (L) 09/06/2019 0310   HCO3 26.0 09/06/2019 0310   TCO2 27 09/06/2019 0310   ACIDBASEDEF 1.0 09/05/2019 0300   O2SAT 96.0 09/06/2019 0310     Coagulation Profile: Recent Labs  Lab 08/26/2019 1651 09/05/19 0258  INR 1.7* 1.7*    Cardiac Enzymes: Recent Labs  Lab 08/20/2019 1651  CKTOTAL 479*    HbA1C: Hgb A1c MFr Bld  Date/Time Value Ref Range Status  09/10/2019 12:24 PM 6.4 (H) 4.8 - 5.6 % Final    Comment:    REPEATED TO VERIFY (NOTE) Pre diabetes:          5.7%-6.4%  Diabetes:               >6.4%  Glycemic control for   <7.0% adults with diabetes   06/25/2019 09:14 AM 5.4 4.8 - 5.6 % Final    Comment:    (NOTE)         Prediabetes: 5.7 - 6.4         Diabetes: >6.4         Glycemic control for adults with diabetes: <7.0     CBG: Recent Labs  Lab 09/06/19 1514 09/06/19 1927 09/06/19 2300 09/07/19 0309 09/07/19 0711  GLUCAP 92 83 120* 128* 119*   The patient is critically ill with multiple organ systems failure and requires high complexity decision making for assessment and support, frequent evaluation and titration of therapies, application of advanced monitoring technologies and extensive interpretation of multiple databases. Critical Care Time devoted to patient care services described in this note independent of APP/resident time (if applicable)  is 32 minutes.   Sherrilyn Rist MD [facility] Personal pager: [phone] If unanswered, please page CCM On-call: [phone]   Addendum: Spoke with patient's spouse at bedside Upset about ongoing issues with patient still kept alive artificially. He believes she has sustained significant brain injury but son wants to keep on the vent On multiple occasions stated that the son was not around to care for his mom before this happened. Will  attempt to speak with patient's son today

## 2019-09-07 NOTE — Progress Notes (Addendum)
Reason for consult:   Subjective: Patient remains intubated and unresponsive   ROS: Unable to obtain due to patient's mental status  Examination  Vital signs in last 24 hours: Temp:  [97.3 F (36.3 C)-99.1 F (37.3 C)] 98.2 F (36.8 C) (06/21 1600) Pulse Rate:  [80-98] 94 (06/21 1600) Resp:  [9-30] 28 (06/21 1600) BP: (157-192)/(43-70) 184/63 (06/21 1530) SpO2:  [97 %-100 %] 99 % (06/21 1600) Arterial Line BP: (110-189)/(41-100) 162/46 (06/21 1600) FiO2 (%):  [40 %] 40 % (06/21 1600) Weight:  [135.3 kg] 135.3 kg (06/21 0500)  General: lying in bed CVS: pulse-normal rate and rhythm RS: breathing comfortably Extremities: normal   Neuro: Mental Status: Patient does not respond to verbal stimuli.  Does not respond to deep sternal rub.  Does not follow commands.  No verbalizations noted.  Cranial Nerves: II: Pupils are bilateral approximately 4 mm, right pupil barely reactive, left pupil no response to light III,IV,VI: doll's response present V,VII: corneal reflex: Absent bilaterally VIII: patient does not respond to verbal stimuli IX,X: Cough reflex present XI: trapezius strength unable to test bilaterally XII: tongue strength unable to test Motor: Minimal withdrawal in bilateral upper extremities Sensory: Does not respond to noxious stimuli in any extremity. Deep Tendon Reflexes:  Absent throughout. Plantars: Mute Cerebellar: Unable to perform  Basic Metabolic Panel: Recent Labs  Lab 09/03/2019 1224 09/14/2019 1309 08/26/2019 1357 09/06/2019 1452 09/06/19 1337 09/06/19 1337 09/06/19 1740 09/06/19 1740 09/06/19 2258 09/07/19 0306 09/07/19 0925  NA 141   < >  --    < > 141  --  140  --  141 141 140  K 6.4*   < >  --    < > 4.7  --  4.4  --  4.4 4.4 4.3  CL 116*  --   --    < > 103  --  102  --  103 103 100  CO2 12*  --   --    < > 26  --  28  --  28 27 26   GLUCOSE 345*  --   --    < > 92  --  95  --  118* 117* 118*  BUN 42*  --   --    < > 49*  --  51*  --  49* 53*  54*  CREATININE 3.25*  --   --    < > 3.83*  --  3.84*  --  4.02* 4.03* 4.29*  CALCIUM 7.9*  --   --    < > 7.6*   < > 7.5*   < > 7.6* 7.3* 7.6*  MG 2.8*  --  2.5*  --   --   --  2.0  --   --  2.1  --   PHOS  --   --  7.4*  --   --   --  4.1  --   --  4.6  --    < > = values in this interval not displayed.    CBC: Recent Labs  Lab 09/07/2019 1224 09/12/2019 1309 09/05/19 0258 09/05/19 0300 09/06/19 0303 09/06/19 0310 09/07/19 0306  WBC 12.3*  12.3*  --  14.2*  --  11.9*  --  11.8*  NEUTROABS 9.2*  --   --   --   --   --   --   HGB 9.2*  9.3*   < > 8.3* 9.2* 7.8* 8.8* 7.7*  HCT 34.1*  34.6*   < >  26.6* 27.0* 23.6* 26.0* 23.9*  MCV 101.8*  102.4*  --  88.4  --  82.8  --  85.1  PLT 158  156  --  113*  --  118*  --  125*   < > = values in this interval not displayed.     Coagulation Studies: Recent Labs    09/06/2019 1651 09/05/19 0258  LABPROT 19.3* 19.0*  INR 1.7* 1.7*    Imaging Reviewed:     ASSESSMENT AND PLAN  Myoclonic status epilepticus: resolved  Anoxic Brain Injury  -Patient is 72 hours post cardiac arrest, however is on some sedation.  Recommending stopping all sedation and repeat neurological assessment is too early to prognosticate at this time, although concern for poor prognosis given the absence of spontaneous movement or spontaneous eye opening. Concern about poor prognosis given patient in myoclonic status epilepticus.  -MRI brain to help assist neurological prognostication    Karena Addison Naleah Kofoed [facility] Pager Number [phone] For questions after 7pm please refer to AMION to reach the Neurologist on call

## 2019-09-07 NOTE — Progress Notes (Signed)
eLink Physician-Brief Progress Note Patient Name: Zoe Clarke DOB: 22-Apr-1950 MRN: [id number]   Date of Service  09/07/2019  HPI/Events of Note  RN requests FMS for diarrhea.  eICU Interventions  FMS ordered.     Intervention Category Minor Interventions: Routine modifications to care plan (e.g. PRN medications for pain, fever)  [name] 09/07/2019, 5:59 AM

## 2019-09-07 NOTE — Progress Notes (Signed)
Patient seen this p.m.  Appears to withdraw to pain Grimaces with deep tracheal suction  We will continue to monitor

## 2019-09-08 ENCOUNTER — Inpatient Hospital Stay (HOSPITAL_COMMUNITY): Payer: PPO

## 2019-09-08 DIAGNOSIS — G931 Anoxic brain damage, not elsewhere classified: Secondary | ICD-10-CM

## 2019-09-08 LAB — BASIC METABOLIC PANEL
Anion gap: 11 (ref 5–15)
BUN: 61 mg/dL — ABNORMAL HIGH (ref 8–23)
CO2: 26 mmol/L (ref 22–32)
Calcium: 7.5 mg/dL — ABNORMAL LOW (ref 8.9–10.3)
Chloride: 102 mmol/L (ref 98–111)
Creatinine, Ser: 4.53 mg/dL — ABNORMAL HIGH (ref 0.44–1.00)
GFR calc Af Amer: 11 mL/min — ABNORMAL LOW (ref >60)
GFR calc non Af Amer: 9 mL/min — ABNORMAL LOW (ref >60)
Glucose, Bld: 104 mg/dL — ABNORMAL HIGH (ref 70–99)
Potassium: 4.1 mmol/L (ref 3.5–5.1)
Sodium: 139 mmol/L (ref 135–145)

## 2019-09-08 LAB — PHOSPHORUS: Phosphorus: 5.4 mg/dL — ABNORMAL HIGH (ref 2.5–4.6)

## 2019-09-08 LAB — GLUCOSE, CAPILLARY
Glucose-Capillary: 111 mg/dL — ABNORMAL HIGH (ref 70–99)
Glucose-Capillary: 99 mg/dL (ref 70–99)
Glucose-Capillary: 120 mg/dL — ABNORMAL HIGH (ref 70–99)
Glucose-Capillary: 122 mg/dL — ABNORMAL HIGH (ref 70–99)
Glucose-Capillary: 121 mg/dL — ABNORMAL HIGH (ref 70–99)

## 2019-09-08 LAB — MAGNESIUM: Magnesium: 2.4 mg/dL (ref 1.7–2.4)

## 2019-09-08 MED ORDER — MORPHINE SULFATE (PF) 2 MG/ML IV SOLN: 2.0000 mg | INTRAVENOUS | Status: DC | PRN

## 2019-09-08 MED ORDER — DEXTROSE 5 % IV SOLN: INTRAVENOUS | Status: DC

## 2019-09-08 MED ORDER — MORPHINE 100MG IN NS 100ML (1MG/ML) PREMIX INFUSION
0.0000 mg/h | INTRAVENOUS | Status: DC
  Administered 2019-09-08: 5 mg/h via INTRAVENOUS
  Filled 2019-09-08 (×2): qty 100

## 2019-09-08 MED ORDER — ACETAMINOPHEN 325 MG PO TABS: 650.0000 mg | ORAL_TABLET | Freq: Four times a day (QID) | ORAL | Status: DC | PRN

## 2019-09-08 MED ORDER — GLYCOPYRROLATE 1 MG PO TABS: 1.0000 mg | ORAL_TABLET | ORAL | Status: DC | PRN

## 2019-09-08 MED ORDER — MIDAZOLAM 50MG/50ML (1MG/ML) PREMIX INFUSION: 0.0000 mg/h | INTRAVENOUS | Status: DC

## 2019-09-08 MED ORDER — GLYCOPYRROLATE 0.2 MG/ML IJ SOLN: 0.2000 mg | INTRAMUSCULAR | Status: DC | PRN

## 2019-09-08 MED ORDER — POLYVINYL ALCOHOL 1.4 % OP SOLN
1.0000 [drp] | Freq: Four times a day (QID) | OPHTHALMIC | Status: DC | PRN
  Filled 2019-09-08: qty 15

## 2019-09-08 MED ORDER — MIDAZOLAM BOLUS VIA INFUSION (WITHDRAWAL LIFE SUSTAINING TX)
2.0000 mg | INTRAVENOUS | Status: DC | PRN
  Filled 2019-09-08: qty 2

## 2019-09-08 MED ORDER — DIPHENHYDRAMINE HCL 50 MG/ML IJ SOLN: 25.0000 mg | INTRAMUSCULAR | Status: DC | PRN

## 2019-09-08 MED ORDER — ACETAMINOPHEN 650 MG RE SUPP: 650.0000 mg | Freq: Four times a day (QID) | RECTAL | Status: DC | PRN

## 2019-09-08 MED ORDER — MIDAZOLAM HCL 2 MG/2ML IJ SOLN: 1.0000 mg | INTRAMUSCULAR | Status: DC | PRN

## 2019-09-08 MED ORDER — MORPHINE BOLUS VIA INFUSION
5.0000 mg | INTRAVENOUS | Status: DC | PRN
  Administered 2019-09-08: 5 mg via INTRAVENOUS
  Filled 2019-09-08: qty 5

## 2019-09-09 LAB — CULTURE, BLOOD (ROUTINE X 2)
Culture: NO GROWTH
Culture: NO GROWTH
Special Requests: ADEQUATE

## 2019-09-17 NOTE — Death Summary Note (Signed)
Asystole observed on cardiac monitor. No spontaneous respirations noted. No palpable pulses noted. Unable to auscultate heart beat. Patient pronounced @ 1906 by Bea Graff RN and Caroline Sauger. Estate manager/land agent. Sommer MD notified. Son, Zoe Clarke, notified. Phone call placed to husband, Sunjai Levandoski, but unable to reach him.

## 2019-09-17 NOTE — Progress Notes (Signed)
Patient transported to MRI & back on the ventilator with no problems. ?

## 2019-09-17 NOTE — Progress Notes (Signed)
Called and updated patient's son about MRI findings  We will call patient's husband and other family members to let them know  MRI results relayed  Patient's bradycardia episodes relayed

## 2019-09-17 NOTE — Progress Notes (Signed)
Reason for consult: Anoxic brain injury prognostication  Subjective: No significant change in exam.  Underwent MRI brain today.  Still requiring low-dose propofol due to agitation.   ROS:  Unable to obtain due to poor mental status  Examination  Vital signs in last 24 hours: Temp:  [96.6 F (35.9 C)-99 F (37.2 C)] 96.6 F (35.9 C) (06/22 1600) Pulse Rate:  [65-99] 65 (06/22 1600) Resp:  [20-26] 20 (06/22 1505) BP: (160-197)/(37-72) 197/72 (06/22 1519) SpO2:  [90 %-100 %] 100 % (06/22 1600) Arterial Line BP: (150-233)/(35-79) 179/67 (06/22 1600) FiO2 (%):  [30 %-55 %] 55 % (06/22 1505) Weight:  [137.5 kg] 137.5 kg (06/22 0413)  General: lying in bed CVS: pulse-normal rate and rhythm RS: breathing comfortably Extremities: normal   Neuro: (On minimal sedation) Mental Status: Patient does not respond to verbal stimuli.  Does not respond to deep sternal rub.  Does not follow commands.  No verbalizations noted.  Cranial Nerves: II: patient does not respond confrontation bilaterally, pupils not reactive III,IV,VI: doll's response present bilaterally V,VII: corneal reflex: Absent VIII: patient does not respond to verbal stimuli IX,X: Cough reflex weakly present XI: trapezius strength unable to test bilaterally XII: tongue strength unable to test Motor: Extremities flaccid throughout.  No spontaneous movement noted.  No purposeful movements noted.  Sensory: Does not respond to noxious stimuli in any extremity. Deep Tendon Reflexes:  Absent throughout. Plantars: Mute Cerebellar: Unable to perform  Basic Metabolic Panel: Recent Labs  Lab 08/26/2019 1357 08/26/2019 1452 09/06/19 1740 09/06/19 1740 09/06/19 2258 09/06/19 2258 09/07/19 0306 09/07/19 0306 09/07/19 0925 09/07/19 1525 10/07/19 0411  NA  --    < > 140   < > 141  --  141  --  140 141 139  K  --    < > 4.4   < > 4.4  --  4.4  --  4.3 4.4 4.1  CL  --    < > 102   < > 103  --  103  --  100 103 102  CO2  --     < > 28   < > 28  --  27  --  26 27 26   GLUCOSE  --    < > 95   < > 118*  --  117*  --  118* 121* 104*  BUN  --    < > 51*   < > 49*  --  53*  --  54* 58* 61*  CREATININE  --    < > 3.84*   < > 4.02*  --  4.03*  --  4.29* 4.34* 4.53*  CALCIUM  --    < > 7.5*   < > 7.6*   < > 7.3*   < > 7.6* 7.7* 7.5*  MG 2.5*  --  2.0  --   --   --  2.1  --   --  2.2 2.4  PHOS 7.4*  --  4.1  --   --   --  4.6  --   --  5.2* 5.4*   < > = values in this interval not displayed.    CBC: Recent Labs  Lab 08/24/2019 1224 09/13/2019 1309 09/05/19 0258 09/05/19 0300 09/06/19 0303 09/06/19 0310 09/07/19 0306  WBC 12.3*  12.3*  --  14.2*  --  11.9*  --  11.8*  NEUTROABS 9.2*  --   --   --   --   --   --  HGB 9.2*  9.3*   < > 8.3* 9.2* 7.8* 8.8* 7.7*  HCT 34.1*  34.6*   < > 26.6* 27.0* 23.6* 26.0* 23.9*  MCV 101.8*  102.4*  --  88.4  --  82.8  --  85.1  PLT 158  156  --  113*  --  118*  --  125*   < > = values in this interval not displayed.     Coagulation Studies: No results for input(s): LABPROT, INR in the last 72 hours.  Imaging Reviewed:   MRI brain consistent with anoxic brain injury  ASSESSMENT AND PLAN  69 year old female with cardiac arrest with ROSC of 24 minutes arrives unresponsive to the ED with myoclonic jerking-concerning for myoclonic status epilepticus.  Patient was loaded with Keppra, EEG showed generalized epileptogenicity. Patient underwent TTM and remains on small amount of sedation.  No spontaneous movement on exam, no spontaneous eye opening. MRI brain shows diffuse anoxic brain damage.   Myoclonic status epilepticus: resolved  Anoxic Brain Injury  -Patient is 72 hours post cardiac arrest, however is on some sedation.  Recommending stopping all sedation and repeat neurological assessment is too early to prognosticate at this time, although concern for poor prognosis given the absence of spontaneous movement or spontaneous eye opening. Concern about poor prognosis given  patient in myoclonic status epilepticus.  -MRI brain to help assist neurological prognostication   CRITICAL CARE Performed by: Lanice Schwab Cerria Randhawa   Total critical care time: 35 minutes  Critical care time was exclusive of separately billable procedures and treating other patients.  Critical care was necessary to treat or prevent imminent or life-threatening deterioration.  Critical care was time spent personally by me on the following activities: development of treatment plan with patient and/or surrogate as well as nursing, discussions with consultants, evaluation of patient's response to treatment, examination of patient, obtaining history from patient or surrogate, ordering and performing treatments and interventions, ordering and review of laboratory studies, ordering and review of radiographic studies, pulse oximetry and re-evaluation of patient's condition.  Karena Addison Advay Volante [facility] Pager Number [phone] For questions after 7pm please refer to AMION to reach the Neurologist on call

## 2019-09-17 NOTE — Death Summary Note (Signed)
DEATH SUMMARY   Patient Details  Name: Zoe Clarke MRN: [id number] DOB: December 04, 1950  Admission/Discharge Information   Admit Date:  09-30-19  Date of Death: Date of Death: 10-04-19  Time of Death: Time of Death: 05/24/04  Length of Stay: 4  Referring Physician: Antony Contras, MD   Reason(s) for Hospitalization  Patient was brought into the hospital following a cardiorespiratory arrest at home, CPR performed with ROSC after about 20 minutes  Diagnoses  Preliminary cause of death: Cardiorespiratory arrest Secondary Diagnoses (including complications and co-morbidities):  Active Problems:   Cardiac arrest ([place]) Anoxic brain injury Hypoxemic encephalopathy Chronic kidney disease Type 2 diabetes Diastolic congestive heart failure  Brief Hospital Course (including significant findings, care, treatment, and services provided and events leading to death)  Zoe Clarke is a 69 y.o. year old female who apparently sustained a cardiorespiratory arrest at home Was found minimally responsive by spouse who attempted CPR, EMS activated. ROSC was achieved after 20 minutes. Patient subsequently rearrested and was resuscitated-admitted to the hospital Sep 30, 2019. Atelectasis right upper lobe noted on initial presentation for which he underwent a bronchoscopy. Was managed with TTM-goal temperature 36. Following rewarming patient remained encephalopathic with minimal brainstem reflexes. Discussions was had with the patient's family and patient was made DNR status MRI was obtained revealing significant anoxic injury, patient remains obtunded. Further discussions had with the patient's family and patient was made comfort measures only Was compassionately extubated and patient succumbed to her illness on 68/23/2021 at 1906 hrs.    Pertinent Labs and Studies  Significant Diagnostic Studies DG Abd 1 View  Result Date: 09/06/2019 CLINICAL DATA:  NG tube placement. EXAM: ABDOMEN - 1 VIEW  COMPARISON:  No recent exams. FINDINGS: Tip and side port of the enteric tube are below the diaphragm in the stomach. Small amount of high-density material within the stomach as well as small bowel may represent enteric contrast or other ingested material. No bowel dilatation in the included abdomen. IMPRESSION: Tip and side port of the enteric tube below the diaphragm in the stomach. Small amount of high-density material within the stomach may represent enteric contrast or other ingested material. Electronically Signed   By: Keith Rake M.D.   On: 09/06/2019 19:33   CT Head Wo Contrast  Result Date: 2019/09/30 CLINICAL DATA:  Found unresponsive. EXAM: CT HEAD WITHOUT CONTRAST TECHNIQUE: Contiguous axial images were obtained from the base of the skull through the vertex without intravenous contrast. COMPARISON:  November 03, 2015 FINDINGS: Brain: There is mild cerebral atrophy with widening of the extra-axial spaces and ventricular dilatation. There are areas of decreased attenuation within the white matter tracts of the supratentorial brain, consistent with microvascular disease changes. A small area of cortical and white matter low attenuation is seen within the parasagittal region of the left occipital lobe (axial CT image 19, CT series number 3). This is present on the prior study. Vascular: No hyperdense vessel or unexpected calcification. Skull: Normal. Negative for fracture or focal lesion. Sinuses/Orbits: There is mild bilateral ethmoid sinus mucosal thickening. Other: None. IMPRESSION: 1. Generalized cerebral atrophy. 2. Small, chronic left occipital lobe infarct. 3. No acute intracranial abnormality. Electronically Signed   By: Virgina Norfolk M.D.   On: Sep 30, 2019 15:34   MR BRAIN WO CONTRAST  Result Date: 10/04/2019 CLINICAL DATA:  Anoxic brain injury. EXAM: MRI HEAD WITHOUT CONTRAST TECHNIQUE: Multiplanar, multiecho pulse sequences of the brain and surrounding structures were obtained  without intravenous contrast. COMPARISON:  Head CT 2019-09-30.  FINDINGS: Brain: Restricted diffusion involving the cerebellar and most of the cerebral cortex, with the exception of medial frontal lobes and insula. There is also restricted diffusion throughout the subcortical white matter, posterior limb of internal capsule and globus pallidi. Findings are consistent with provided history of anoxic brain injury. Vascular: Normal flow voids. Skull and upper cervical spine: Normal marrow signal. Sinuses/Orbits: Presence of fluid throughout the paranasal sinuses, mastoids, nasal and oropharynx, related to the presence of endotracheal to. Left lens surgery. Other: None. IMPRESSION: Findings consistent with diffuse anoxic brain injury. These results were called by pager system at the time of interpretation on 2019-09-14 at 3:33 pm to provider [facility] . Electronically Signed   By: Pedro Earls M.D.   On: 2019-09-14 15:34   DG CHEST PORT 1 VIEW  Result Date: 09/07/2019 CLINICAL DATA:  Respiratory failure. EXAM: PORTABLE CHEST 1 VIEW COMPARISON:  Single view of the chest 09/12/2019. FINDINGS: Endotracheal tube tip is in place 2 cm above the carina. NG tube courses into the stomach and below the inferior margin of the film. Right upper lobe collapse seen on new comparison study has resolved but there is new pulmonary edema and right greater than left pleural effusions. Bibasilar airspace disease is worse on the right. IMPRESSION: Endotracheal tube in good position. New pulmonary edema and bilateral pleural effusions. Right worse than left basilar airspace disease is new since the prior exam and could be due to atelectasis or pneumonia. Electronically Signed   By: Inge Rise M.D.   On: 09/07/2019 10:57   DG Chest Portable 1 View  Result Date: 08/21/2019 CLINICAL DATA:  Prior intubation. EXAM: PORTABLE CHEST 1 VIEW COMPARISON:  06/26/2019. FINDINGS: Endotracheal tube noted with its tip 2  cm above the carina. NG tube noted with its tip below left hemidiaphragm. Prominent atelectasis and consolidation right upper lung. Close follow-up exams suggested to demonstrate resolution and to exclude obstructing mass lesion/tumor. Cardiomegaly. No pulmonary venous congestion. Mild thoracic spine scoliosis concave left. IMPRESSION: 1. Endotracheal tube noted with its tip 2 cm above the carina. NG tube noted with its tip below left hemidiaphragm. 2. Prominent atelectasis and consolidation right upper lung. Close follow-up exam suggested to demonstrate resolution to exclude obstructing mass lesion/tumor. Electronically Signed   By: Marcello Moores  Register   On: 09/05/2019 12:54   EEG adult  Result Date: 09/12/2019 Lora Havens, MD     09/06/2019  8:37 AM Patient Name: Zoe Clarke MRN: [id number] Epilepsy Attending: Lora Havens Referring Physician/Provider: Merlene Laughter, PA Date: 08/29/2019 Duration: 22.46 mins Patient history: 69yo F s/p cardiac arrest on TTM. EEG to evaluate for seizures. Level of alertness: comatose AEDs during EEG study: Propofol Technical aspects: This EEG study was done with scalp electrodes positioned according to the 10-20 International system of electrode placement. Electrical activity was acquired at a sampling rate of _0  and reviewed with a high frequency filter of _1  and a low frequency filter of _2 . EEG data were recorded continuously and digitally stored. Description: EEG showed continuous generalized background suppression. Frequent generalized polyspikes were noted during which patient was noted to have eye opening and brief axial jerk consistent with myoclonic seizures. EEG was not reactive to tactile stimulation. Hyperventilation and photic stimulation were not performed.   ABNORMALITY - Myoclonic seizure, generalized - Background suppression, generalized IMPRESSION: This study showed frequent myoclonic seizures during which patient was noted to have brief eye  opening and axial jerk as well as profound diffuse encephalopathy,  most likely secondary to anoxic-hypoxic brain injury. Ordering provider was notified. Priyanka Barbra Sarks   Overnight EEG with video  Result Date: 09/05/2019 Lora Havens, MD     09/06/2019  8:38 AM Patient Name: Zoe Clarke MRN: [id number] Epilepsy Attending: Lora Havens Referring Physician/Provider: Merlene Laughter, PA Duration: 09/01/2019 1708 to 09/05/2019 1708  Patient history: 69yo F s/p cardiac arrest on TTM. EEG to evaluate for seizures.  Level of alertness: comatose  AEDs during EEG study: Propofol, keppra  Technical aspects: This EEG study was done with scalp electrodes positioned according to the 10-20 International system of electrode placement. Electrical activity was acquired at a sampling rate of _0  and reviewed with a high frequency filter of _1  and a low frequency filter of _2 . EEG data were recorded continuously and digitally stored.  Description: EEG showed continuous generalized burst suppression with highy epileptiform bursts of generalized spike/polyspike and wave. EEG was not reactive to tactile stimulation. Hyperventilation and photic stimulation were not performed.    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  Lora Havens    Microbiology Recent Results (from the past 240 hour(s))  SARS Coronavirus 2 by RT PCR (hospital order, performed in [facility] hospital lab) Nasopharyngeal Nasopharyngeal Swab     Status: None   Collection Time: 08/29/2019  1:36 PM   Specimen: Nasopharyngeal Swab  Result Value Ref Range Status   SARS Coronavirus 2 NEGATIVE NEGATIVE Final    Comment: (NOTE) SARS-CoV-2 target nucleic acids are NOT DETECTED.  The SARS-CoV-2 RNA is generally detectable in upper  and lower respiratory specimens during the acute phase of infection. The lowest concentration of SARS-CoV-2 viral copies this assay can detect is 250 copies / mL. A negative result does not preclude SARS-CoV-2 infection and should not be used as the sole basis for treatment or other patient management decisions.  A negative result may occur with improper specimen collection / handling, submission of specimen other than nasopharyngeal swab, presence of viral mutation(s) within the areas targeted by this assay, and inadequate number of viral copies (<250 copies / mL). A negative result must be combined with clinical observations, patient history, and epidemiological information.  Fact Sheet for Patients:   StrictlyIdeas.no  Fact Sheet for Healthcare Providers: BankingDealers.co.za  This test is not yet approved or  cleared by the [place] FDA and has been authorized for detection and/or diagnosis of SARS-CoV-2 by FDA under an Emergency Use Authorization (EUA).  This EUA will remain in effect (meaning this test can be used) for the duration of the COVID-19 declaration under Section 564(b)(1) of the Act, 21 U.S.C. section 360bbb-3(b)(1), unless the authorization is terminated or revoked sooner.  Performed at [facility], [place] [address], [place], [place] [address]   Blood culture (routine x 2)     Status: None   Collection Time: 09/06/2019  1:58 PM   Specimen: BLOOD  Result Value Ref Range Status   Specimen Description BLOOD SITE NOT SPECIFIED  Final   Special Requests   Final    BOTTLES DRAWN AEROBIC AND ANAEROBIC Blood Culture adequate volume   Culture   Final    NO GROWTH 5 DAYS Performed at [facility], [address], [place], [place] [address]    Report Status 09/09/2019 FINAL  Final  Urine culture  Status: None   Collection Time: 08/30/2019  2:25 PM   Specimen: Urine, Catheterized  Result Value Ref Range  Status   Specimen Description URINE, CATHETERIZED  Final   Special Requests NONE  Final   Culture   Final    NO GROWTH Performed at [facility], [address], [place], [place] [address]    Report Status 09/05/2019 FINAL  Final  Culture, respiratory     Status: None   Collection Time: 09/16/2019  2:33 PM   Specimen: Bronchoalveolar Lavage; Respiratory  Result Value Ref Range Status   Specimen Description BRONCHIAL ALVEOLAR LAVAGE  Final   Special Requests NONE  Final   Gram Stain   Final    ABUNDANT WBC PRESENT, PREDOMINANTLY PMN ABUNDANT GRAM POSITIVE COCCI MODERATE GRAM NEGATIVE RODS FEW GRAM VARIABLE ROD    Culture   Final    Consistent with normal respiratory flora. Performed at [facility], [place] [address], [place], [place] [address]    Report Status 09/06/2019 FINAL  Final  Blood culture (routine x 2)     Status: None   Collection Time: 09/03/2019  4:50 PM   Specimen: BLOOD LEFT HAND  Result Value Ref Range Status   Specimen Description BLOOD LEFT HAND  Final   Special Requests   Final    BOTTLES DRAWN AEROBIC AND ANAEROBIC Blood Culture results may not be optimal due to an inadequate volume of blood received in culture bottles   Culture   Final    NO GROWTH 5 DAYS Performed at [facility], [place] [address] [place],  [address]    Report Status 09/09/2019 FINAL  Final  MRSA PCR Screening     Status: None   Collection Time: 09/09/2019  5:29 PM   Specimen: Nasal Mucosa; Nasopharyngeal  Result Value Ref Range Status   MRSA by PCR NEGATIVE NEGATIVE Final    Comment:        The GeneXpert MRSA Assay (FDA approved for NASAL specimens only), is one component of a comprehensive MRSA colonization surveillance program. It is not intended to diagnose MRSA infection nor to guide or monitor treatment for MRSA infections. Performed at [facility], [place] [address], [place],  [address]     Lab Basic Metabolic Panel: Recent Labs  Lab  08/25/2019 1357 09/11/2019 1452 09/06/19 1740 09/06/19 1740 09/06/19 2258 09/07/19 0306 09/07/19 0925 09/07/19 1525 28-Sep-2019 0411  NA  --    < > 140   < > 141 141 140 141 139  K  --    < > 4.4   < > 4.4 4.4 4.3 4.4 4.1  CL  --    < > 102   < > 103 103 100 103 102  CO2  --    < > 28   < > _0 GLUCOSE  --    < > 95   < > 118* 117* 118* 121* 104*  BUN  --    < > 51*   < > 49* 53* 54* 58* 61*  CREATININE  --    < > 3.84*   < > 4.02* 4.03* 4.29* 4.34* 4.53*  CALCIUM  --    < > 7.5*   < > 7.6* 7.3* 7.6* 7.7* 7.5*  MG 2.5*  --  2.0  --   --  2.1  --  2.2 2.4  PHOS 7.4*  --  4.1  --   --  4.6  --  5.2* 5.4*   < > = values in this interval not displayed.   Liver Function Tests: Recent Labs  Lab 09/13/2019 1357 09/07/19 0925  AST 736* 228*  ALT 627* 810*  ALKPHOS 204* 146*  BILITOT 1.5* 1.4*  PROT 5.6* 5.9*  ALBUMIN 2.4* 2.2*   No results for input(s): LIPASE, AMYLASE in the last 168 hours. Recent Labs  Lab 09/07/19 0925  AMMONIA 10   CBC: Recent Labs  Lab 09/10/2019 1224 09/01/2019 1309 09/05/19 0258 09/05/19 0300 09/06/19 0303 09/06/19 0310 09/07/19 0306  WBC 12.3*  12.3*  --  14.2*  --  11.9*  --  11.8*  NEUTROABS 9.2*  --   --   --   --   --   --   HGB 9.2*  9.3*   < > 8.3* 9.2* 7.8* 8.8* 7.7*  HCT 34.1*  34.6*   < > 26.6* 27.0* 23.6* 26.0* 23.9*  MCV 101.8*  102.4*  --  88.4  --  82.8  --  85.1  PLT 158  156  --  113*  --  118*  --  125*   < > = values in this interval not displayed.   Cardiac Enzymes: Recent Labs  Lab 09/14/2019 1651  CKTOTAL 479*   Sepsis Labs: Recent Labs  Lab 09/11/2019 1224 09/09/2019 1651 09/05/19 0258 09/06/19 0303 09/07/19 0306  WBC 12.3*  12.3*  --  14.2* 11.9* 11.8*  LATICACIDVEN 7.4* 5.4*  --   --   --     Procedures/Operations     Valena Ivanov A Doriann Zuch 09/09/2019, 10:59 AM

## 2019-09-17 NOTE — Progress Notes (Addendum)
NAME:  Zoe Clarke, MRN:  [id number], DOB:  02/28/1951, LOS: 4 ADMISSION DATE:  09/05/2019, CONSULTATION DATE:  08/20/2019 REFERRING MD:  Dr. Billy Fischer, CHIEF COMPLAINT:  Cardiac arrest   Brief History   69 year old female found down by husband at home with eventual cardiac arrest, rhythm pending EMS arrival was asystole.  History of present illness   Zoe Clarke is a 69 year old female with a past medical history significant for sleep apnea, hypertension, GERD, type 2 diabetes, diastolic congestive heart failure, asthma, hyperthermia who was found down by her husband today and suffered cardiac arrest.  History obtained via family as patient is currently unresponsive.  It appears patient husband found her minimally responsive this morning and attempted to arouse her but was not successful and at that time she became pulseless. Per husband then attempted to preform CPR but due to frailty he was unsuccessful. On EMS arrival arrival patient was found in asystole and CPR in combination with ACLS protocol was started, ROSC was obtained after 2mns. Patient cardiac arrested again and ROSC again obtained.  On arrival to ED patient was unresponsive. ABG 7.04 / 61.8 / 143 / 6.7. Lab work significant for hyperkalemia K 6.4, hyperglycemia 333, creatinine 3.25, Troponin 1,887, Lactic acid 7.4, WBC 12.3, hgb 9.3. Chest xray on arrival with consolidation in right upper lobe for which she underwent bedside bronchoscopy while in the ED. PCCM consulted for further management and admission.   Past Medical  History   Past Medical History:  Diagnosis Date  . Allergy   . Arthritis   . Asthma    weazing at night uses inhalers  . Bronchitis    history of  . [place]  . [place] 4 [place]   . [place]   . Gastric ulcer   . GERD (gastroesophageal reflux disease)   . History of hiatal  hernia   . Hypertension   . Osteoporosis   . Sleep apnea 09/22/2018    Significant Hospital Events   Admitted 6/18  Consults:  PCCM  Procedures:  6/18 bedside bronchoscope with BAL   Significant Diagnostic Tests:  CRX 6/18 >  1. Endotracheal tube noted with its tip 2 cm above the carina. NG tube noted with its tip below left hemidiaphragm. 2. Prominent atelectasis and consolidation right upper lung. Close follow-up exam suggested to demonstrate resolution to exclude obstructing mass lesion/tumor.  Micro Data:  COVID 6/18 > MRSA PCR 6/18 > Blood culture 6/18 > Urine culture 6/18 BAL culture 6/18  Antimicrobials:  Zosyn 6/18 >> Cefepime 6/18 Vancomycin 6/18  Interim history/subjective:  Unresponsive on ventilator.  Breathing above the vent, will respond to pain by withdrawing  Objective   Blood pressure (!) 195/67, pulse 76, temperature 98.8 F (37.1 C), temperature source Axillary, resp. rate 20, height _0  (1.727 m), weight (!) 137.5 kg, SpO2 100 %.    Vent Mode: PRVC FiO2 (%):  [30 %-40 %] 30 % Set Rate:  [20 bmp] 20 bmp Vt Set:  [510 mL] 510 mL PEEP:  [5 cmH20] 5 cmH20 Plateau Pressure:  [20 cmH20-30 cmH20] 20 cmH20   Intake/Output Summary (Last 24 hours) at 6Jun 27, [id number] Last data filed at [id number] Gross per 24 hour  Intake 674.11 ml  Output 705 ml  Net -30.89 ml   Filed Weights   09/06/19 0330 09/07/19 0500 0June 27, [id number]  Weight: 135.3 kg 135.3 kg (!) 137.5  kg    Examination:  General: Elderly lady on mechanical ventilation, obese HEENT: ET tube in place, moist oral mucosa Neuro: Unresponsive to verbal stimuli, some response to pain, grimaces with deep tracheal suctioning CV: S1-S2 appreciated PULM: Rhonchi GI: Soft, bowel sounds appreciated Extremities: warm/dry, significant generalized  edema  Skin: no rashes or lesions  Chest x-ray reviewed by myself this morning showing bibasilar atelectasis Resolved Hospital Problem list      Assessment & Plan:   Critically ill due to cardiac arrest of unclear etiology requiring therapeutic temperature management -Wean sedation to allow for neuro prognostication-currently on propofol -Very tachycardic and tachypneic with high BPs off sedation -Current exam is poor indicating poor long-term prognosis-no response to verbal stimulation  Critically ill due to  Acute hypoxic respiratory failure in the context of cardiac arrest -Full ventilatory support.  Questionable aspiration pneumonia -Continue Zosyn.day 5/7 -No fever, no leukocytosis  Anoxic ischemic encephalopathy -Wean off sedation to allow for neuro prognostication.   -Obtain MRI today  HX of diastolic congestive heart failure  Type 2 diabetes CKD stage 4  Goals of care  By Dr. Lynetta Mare: Patients family include spouse and son were updated extensively regarding cardiac arrest and now poor neurological exam. Decision was made collectively among family to continue aggressive medical care but make patient a DNR. We will await head CT scan results and if there is evidence of severe anoxic injury family will pursue comfort measures.  Did discuss with spouse yesterday He does not believe patient will recover from the arrest Will obtain MRI, hold sedation for neuro prognostication       Best practice:  Diet: Continue tube feeds Pain/Anxiety/Delirium protocol (if indicated): None VAP protocol (if indicated): In place  DVT prophylaxis: SubQ heparin GI prophylaxis: PPI Glucose control: SSI Mobility: Bedrest Code Status: Full Family Communication: Will update Disposition: ICU  Labs   CBC: Recent Labs  Lab 09/05/2019 1224 08/31/2019 1309 09/05/19 0258 09/05/19 0300 09/06/19 0303 09/06/19 0310 09/07/19 0306  WBC 12.3*  12.3*  --  14.2*  --  11.9*  --  11.8*  NEUTROABS 9.2*  --   --   --   --   --   --   HGB 9.2*  9.3*   < > 8.3* 9.2* 7.8* 8.8* 7.7*  HCT 34.1*  34.6*   < > 26.6* 27.0* 23.6* 26.0* 23.9*   MCV 101.8*  102.4*  --  88.4  --  82.8  --  85.1  PLT 158  156  --  113*  --  118*  --  125*   < > = values in this interval not displayed.    Basic Metabolic Panel: Recent Labs  Lab 09/12/2019 1357 08/27/2019 1452 09/06/19 1740 09/06/19 1740 09/06/19 2258 09/07/19 0306 09/07/19 0925 09/07/19 1525 10-06-2019 0411  NA  --    < > 140   < > 141 141 140 141 139  K  --    < > 4.4   < > 4.4 4.4 4.3 4.4 4.1  CL  --    < > 102   < > 103 103 100 103 102  CO2  --    < > 28   < > _0 GLUCOSE  --    < > 95   < > 118* 117* 118* 121* 104*  BUN  --    < > 51*   < > 49* 53* 54* 58* 61*  CREATININE  --    < >  3.84*   < > 4.02* 4.03* 4.29* 4.34* 4.53*  CALCIUM  --    < > 7.5*   < > 7.6* 7.3* 7.6* 7.7* 7.5*  MG 2.5*  --  2.0  --   --  2.1  --  2.2 2.4  PHOS 7.4*  --  4.1  --   --  4.6  --  5.2* 5.4*   < > = values in this interval not displayed.   GFR: Estimated Creatinine Clearance: 17.7 mL/min (A) (by C-G formula based on SCr of 4.53 mg/dL (H)). Recent Labs  Lab 09/03/2019 1224 08/20/2019 1651 09/05/19 0258 09/06/19 0303 09/07/19 0306  WBC 12.3*  12.3*  --  14.2* 11.9* 11.8*  LATICACIDVEN 7.4* 5.4*  --   --   --     Liver Function Tests: Recent Labs  Lab 09/15/2019 1357 09/07/19 0925  AST 736* 228*  ALT 627* 810*  ALKPHOS 204* 146*  BILITOT 1.5* 1.4*  PROT 5.6* 5.9*  ALBUMIN 2.4* 2.2*   No results for input(s): LIPASE, AMYLASE in the last 168 hours. Recent Labs  Lab 09/07/19 0925  AMMONIA 10    ABG    Component Value Date/Time   PHART 7.595 (H) 09/06/2019 0310   PCO2ART 26.7 (L) 09/06/2019 0310   PO2ART 67 (L) 09/06/2019 0310   HCO3 26.0 09/06/2019 0310   TCO2 27 09/06/2019 0310   ACIDBASEDEF 1.0 09/05/2019 0300   O2SAT 96.0 09/06/2019 0310     Coagulation Profile: Recent Labs  Lab 09/03/2019 1651 09/05/19 0258  INR 1.7* 1.7*    Cardiac Enzymes: Recent Labs  Lab 09/05/2019 1651  CKTOTAL 479*    HbA1C: Hgb A1c MFr Bld  Date/Time Value Ref Range  Status  08/18/2019 12:24 PM 6.4 (H) 4.8 - 5.6 % Final    Comment:    REPEATED TO VERIFY (NOTE) Pre diabetes:          5.7%-6.4%  Diabetes:              >6.4%  Glycemic control for   <7.0% adults with diabetes   06/25/2019 09:14 AM 5.4 4.8 - 5.6 % Final    Comment:    (NOTE)         Prediabetes: 5.7 - 6.4         Diabetes: >6.4         Glycemic control for adults with diabetes: <7.0     CBG: Recent Labs  Lab 09/07/19 1521 09/07/19 1925 09/07/19 2326 14-Sep-2019 0321 09/14/2019 0743  GLUCAP 123* 114* 111* 99 120*   The patient is critically ill with multiple organ systems failure and requires high complexity decision making for assessment and support, frequent evaluation and titration of therapies, application of advanced monitoring technologies and extensive interpretation of multiple databases. Critical Care Time devoted to patient care services described in this note independent of APP/resident time (if applicable)  is 32 minutes.   Sherrilyn Rist MD [facility] Personal pager: [phone] If unanswered, please page CCM On-call: [phone]

## 2019-09-17 NOTE — Progress Notes (Signed)
Patient compassionately extubated at 1815 per MD order at family's request.

## 2019-09-17 DEATH — deceased

## 2019-11-06 ENCOUNTER — Ambulatory Visit: Payer: PPO | Admitting: Cardiovascular Disease

## 2020-07-11 IMAGING — US ULTRASOUND ABDOMEN LIMITED
1 series · 14 of 25 positions shown · non-contrast
Comparison: Right upper quadrant ultrasound dated 04/27/2018

CLINICAL DATA: 67-year-old female with right upper quadrant
abdominal pain.

EXAM:
ULTRASOUND ABDOMEN LIMITED RIGHT UPPER QUADRANT

[Series 1: ultrasound abdomen limited · 14 of 27 slices shown]
[im 1/27]
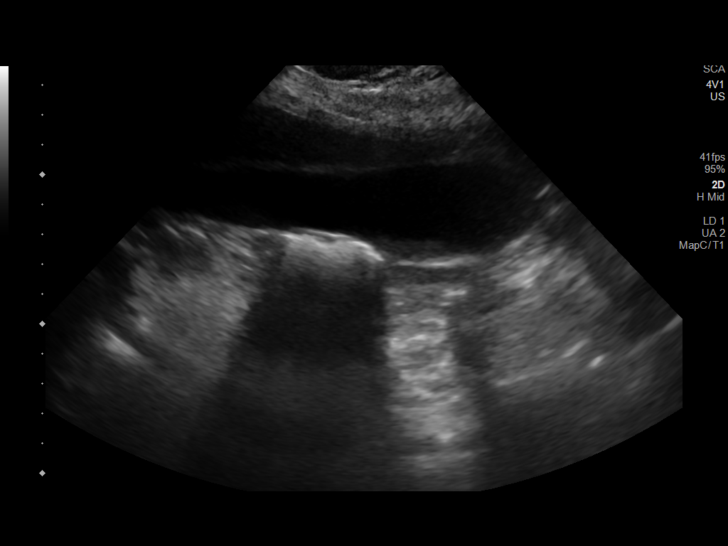
[im 3/27]
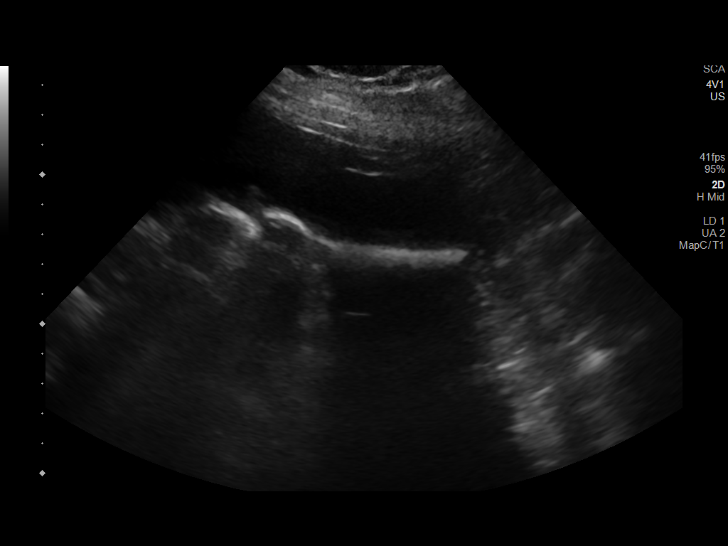
[im 5/27]
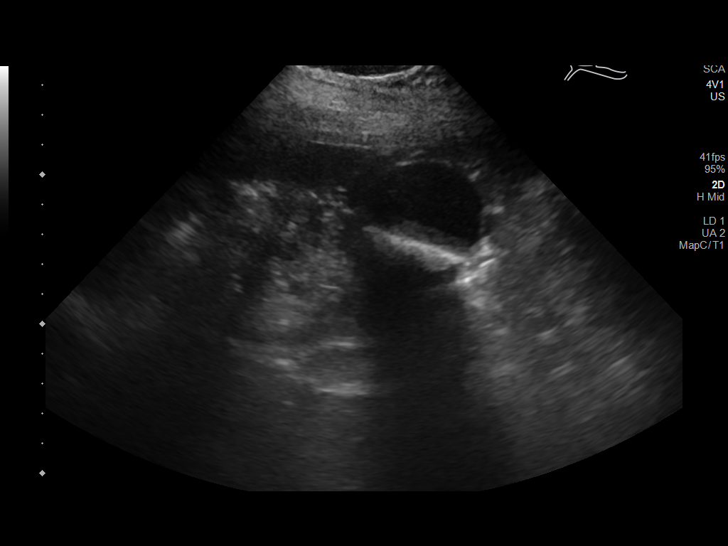
[im 7/27]
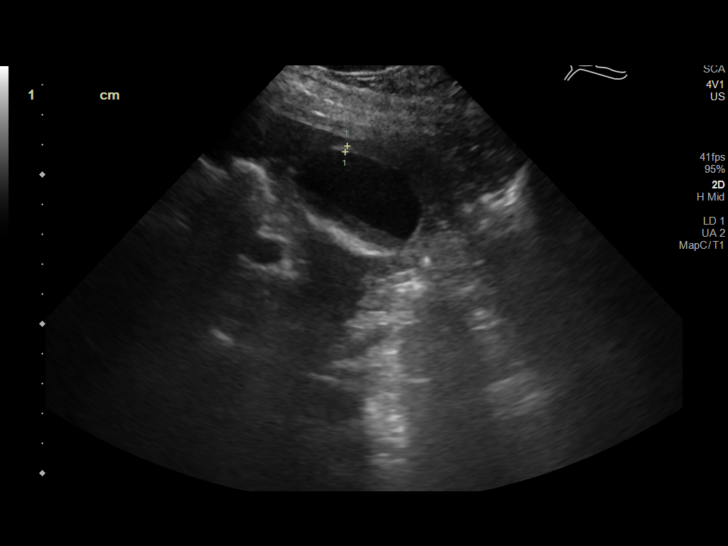
[im 9/27]
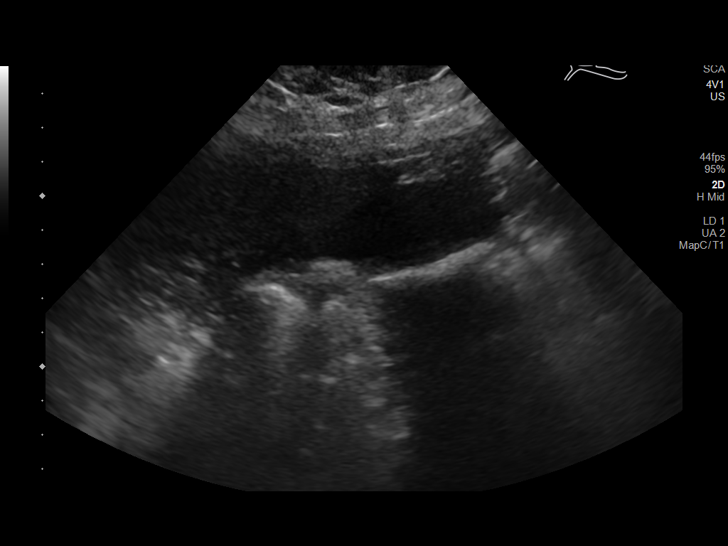
[im 10/27]
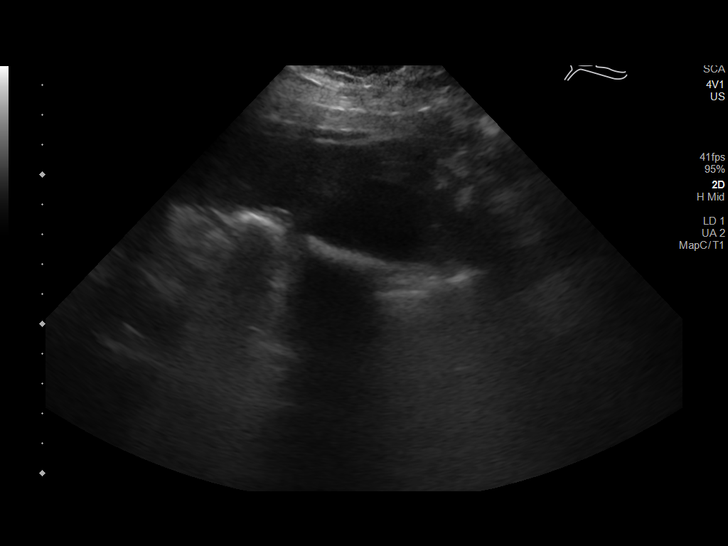
[im 12/27]
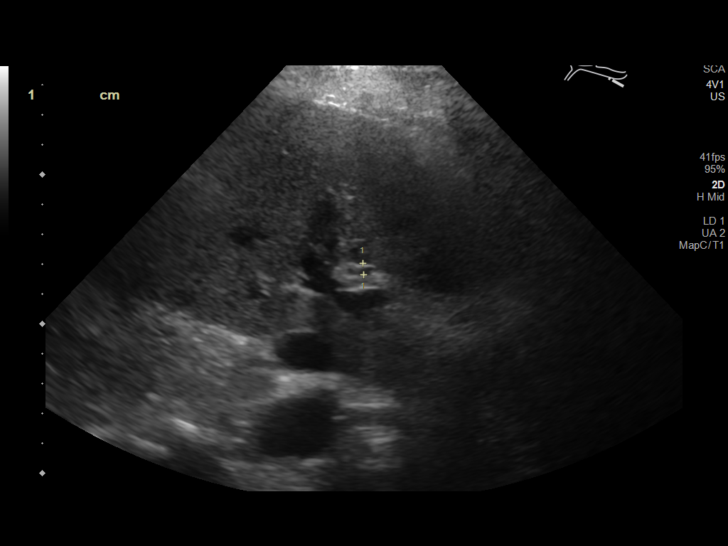
[im 15/27]
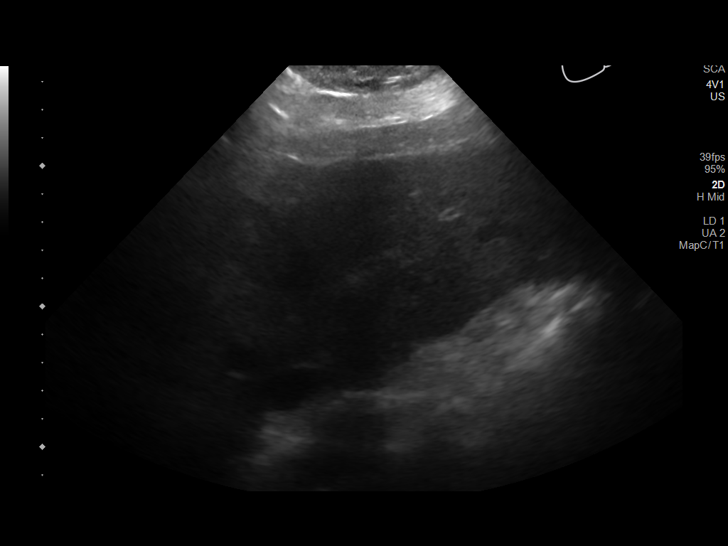
[im 17/27]
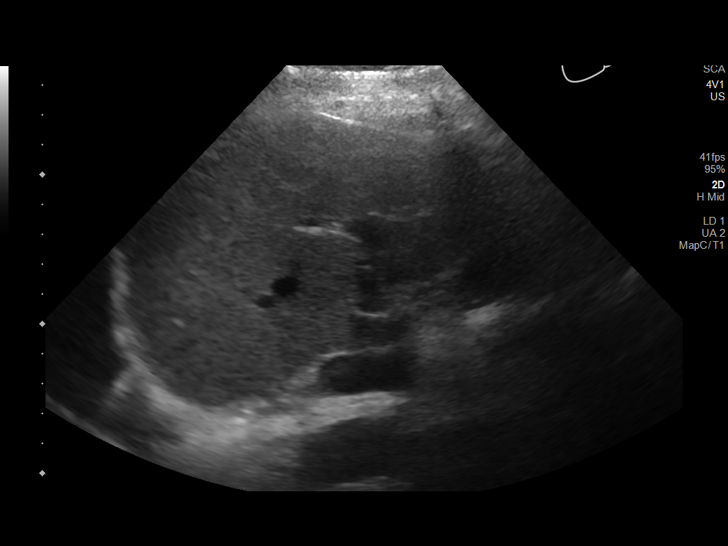
[im 18/27]
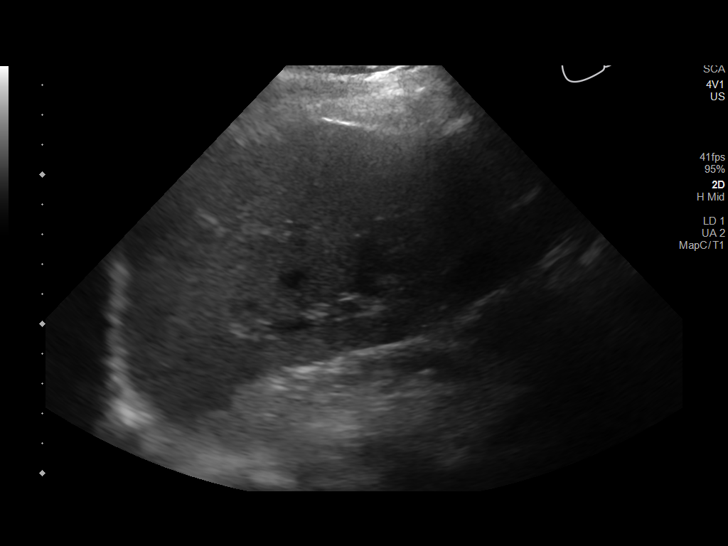
[im 20/27]
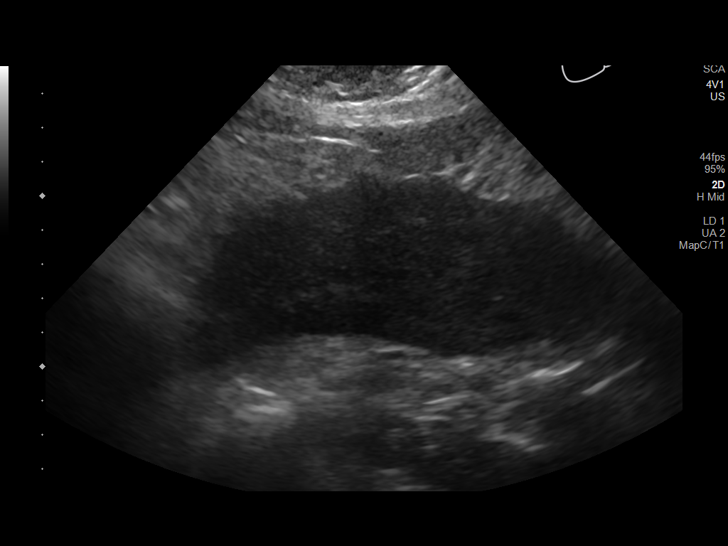
[im 22/27]
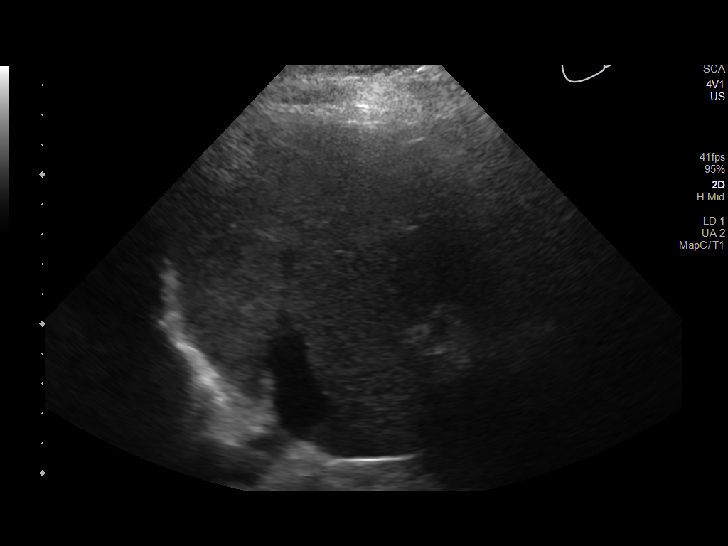
[im 24/27]
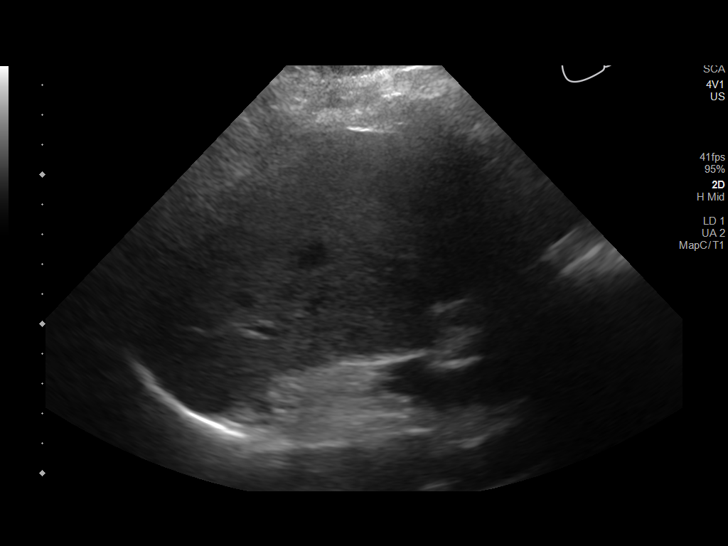
[im 27/27]
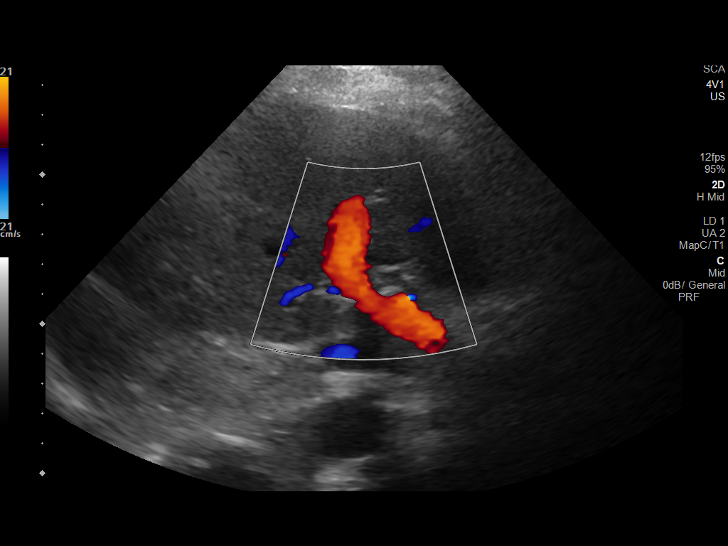

[14 of 25 positions shown; findings below may reference images not displayed]

FINDINGS: Gallbladder:

There is layering sludge and stone within the gallbladder. No
gallbladder wall thickening or pericholecystic fluid. Positive
sonographic Murphy's sign.

Common bile duct:

Diameter: 4 mm

Liver:

There is enlargement of the left lobe of the liver. Portal vein is
patent on color Doppler imaging with normal direction of blood flow
towards the liver.
IMPRESSION: Gallbladder sludge and small stones and positive sonographic
Murphy's sign. No other sonographic findings of acute cholecystitis.
A hepatobiliary scintigraphy may provide better evaluation of the
gallbladder if there is a high clinical concern for acute
cholecystitis .

## 2020-07-11 IMAGING — DX PORTABLE CHEST - 1 VIEW
1 series · 1 of 1 positions shown · non-contrast
Comparison: 04/28/2018

CLINICAL DATA: Chest pain short of breath

EXAM:
PORTABLE CHEST 1 VIEW

[chest ap]
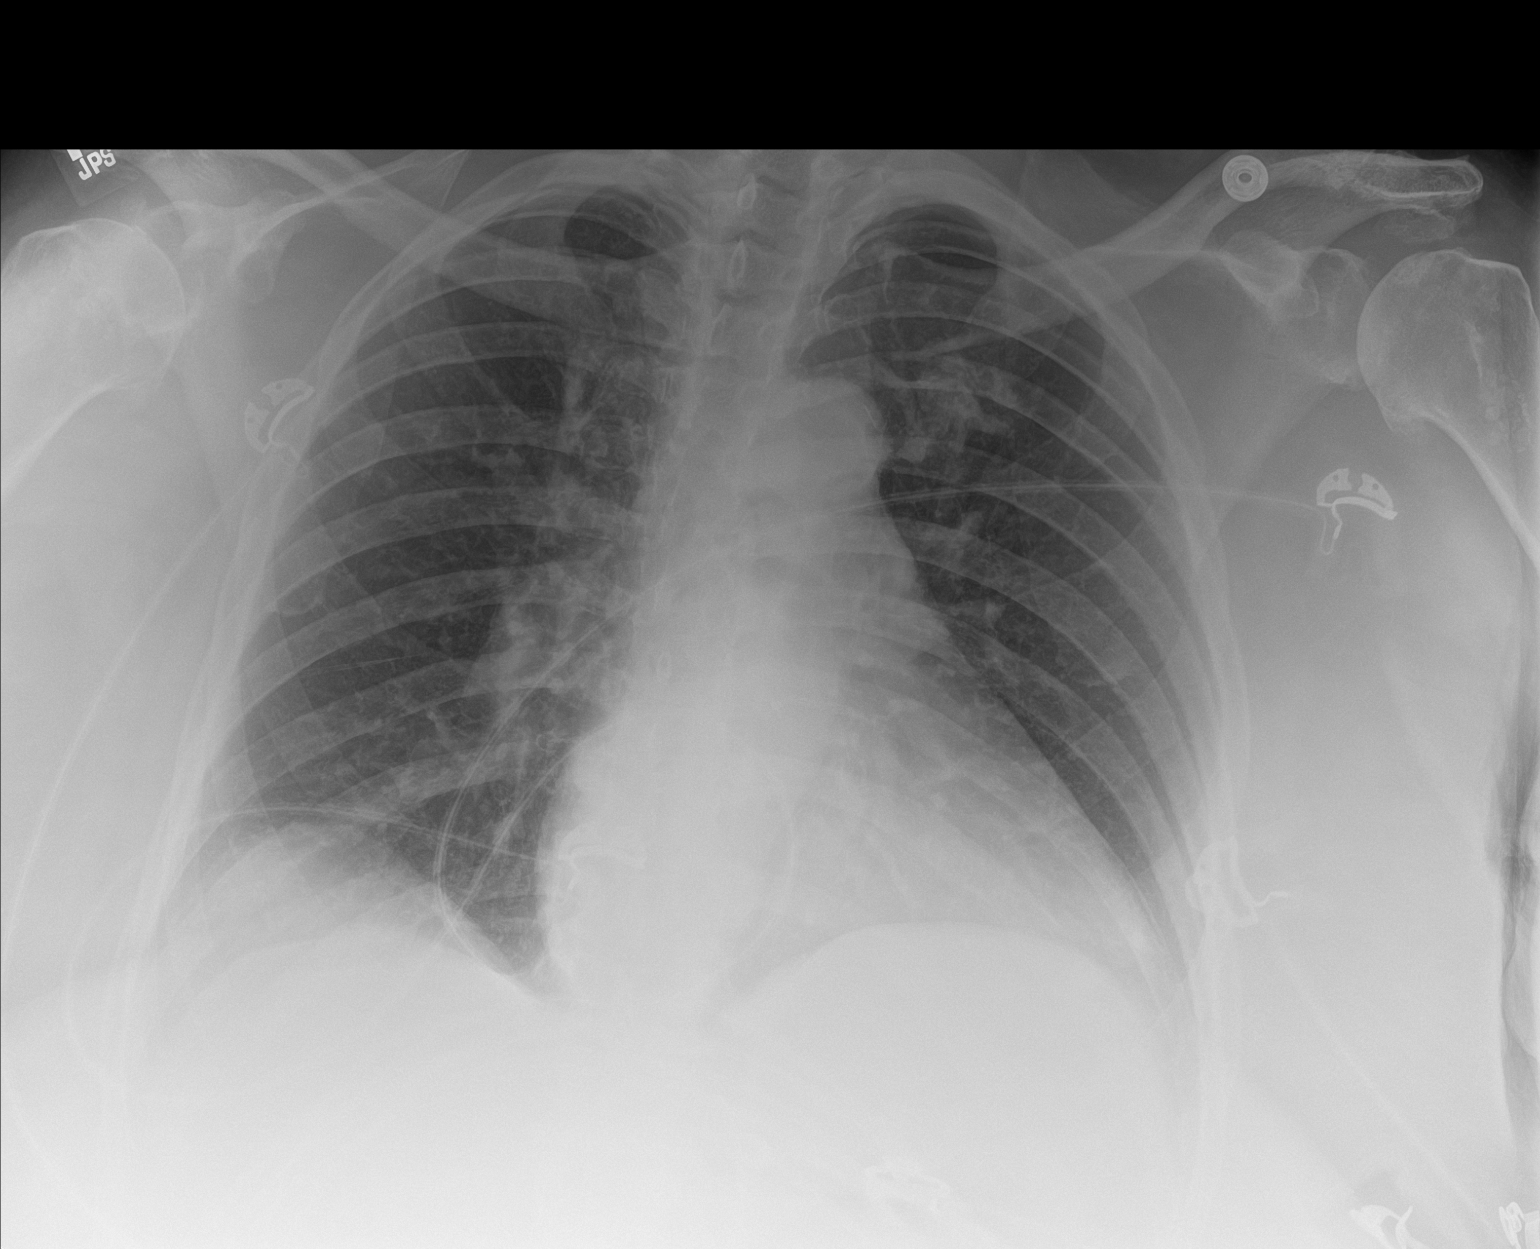

[1 of 1 positions shown; findings below may reference images not displayed]

FINDINGS: Cardiac enlargement without heart failure. Lungs are now clear
without infiltrate or effusion. Interval resolution of small
effusions and right lower lobe airspace disease.
IMPRESSION: No active disease.

## 2021-06-19 IMAGING — CR DG HIP (WITH OR WITHOUT PELVIS) 2-3V*R*
3 series · 3 of 3 positions shown · non-contrast
Comparison: Radiographs dated 03/31/2019

CLINICAL DATA: Right hip pain secondary to a fall.

EXAM:
DG HIP (WITH OR WITHOUT PELVIS) 2-3V RIGHT

[pelvis ap]
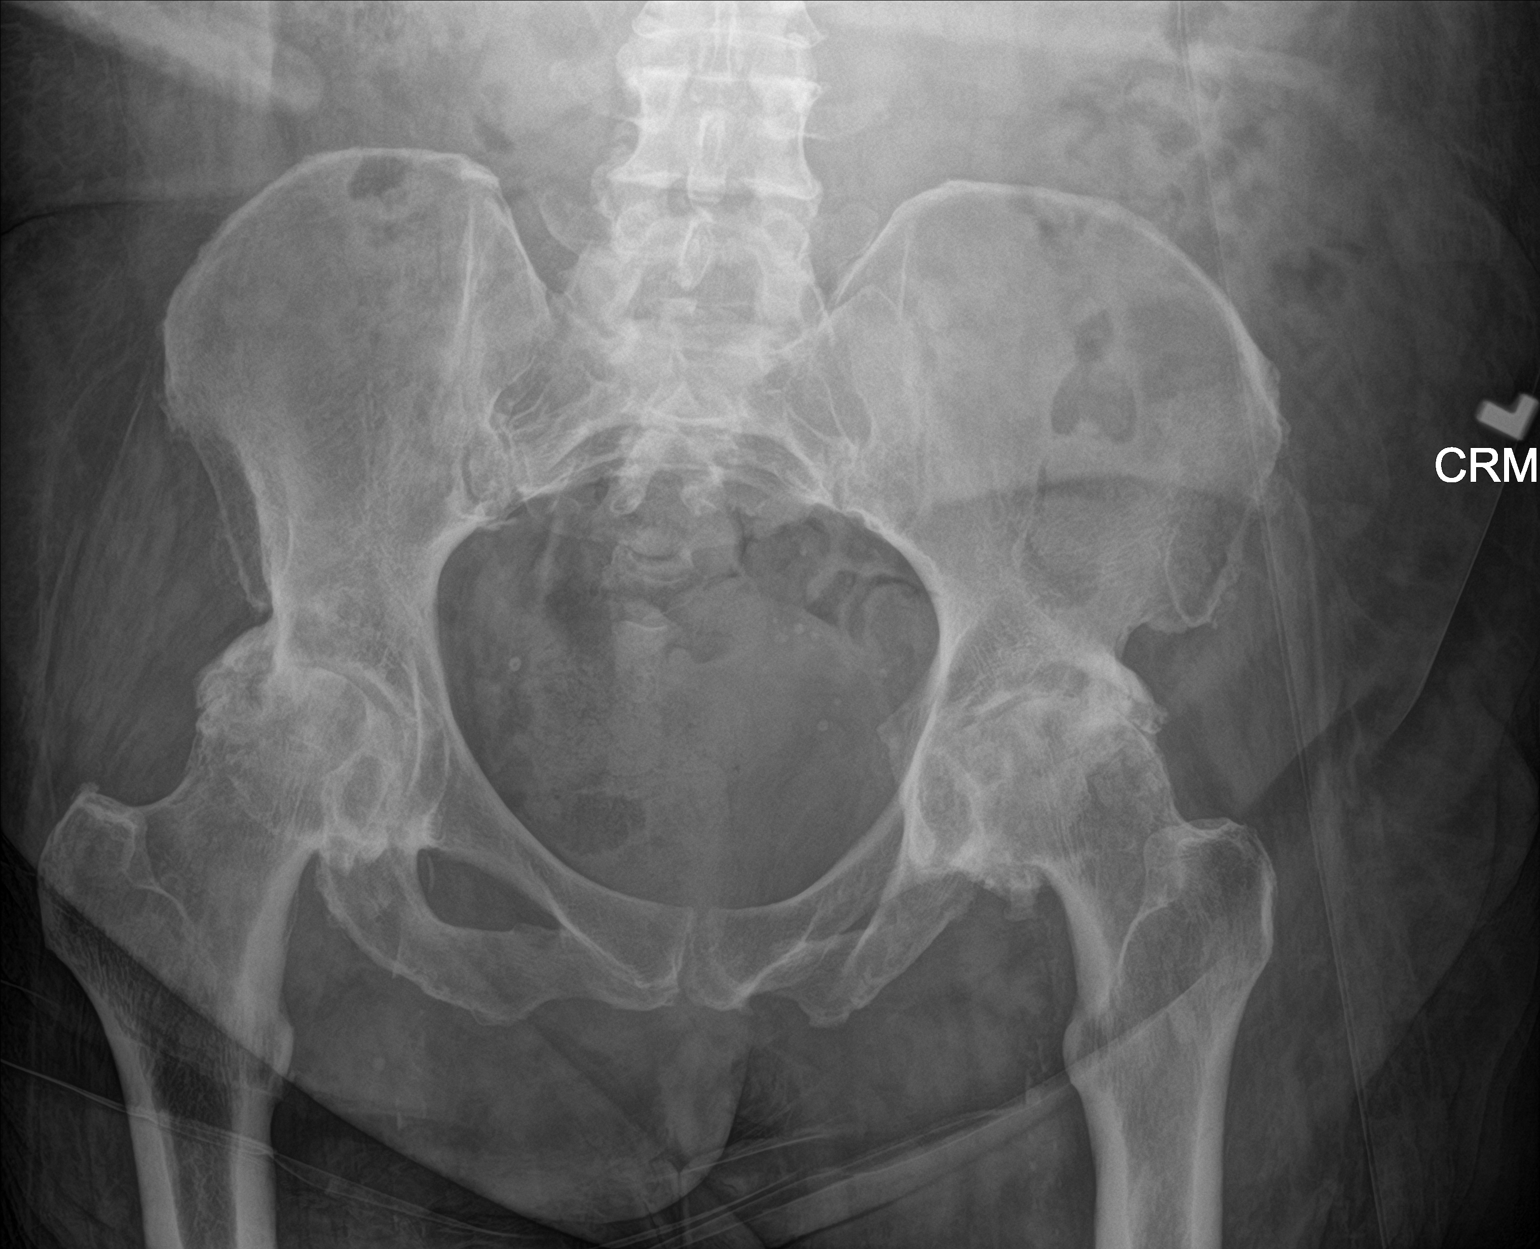

[hip ap]
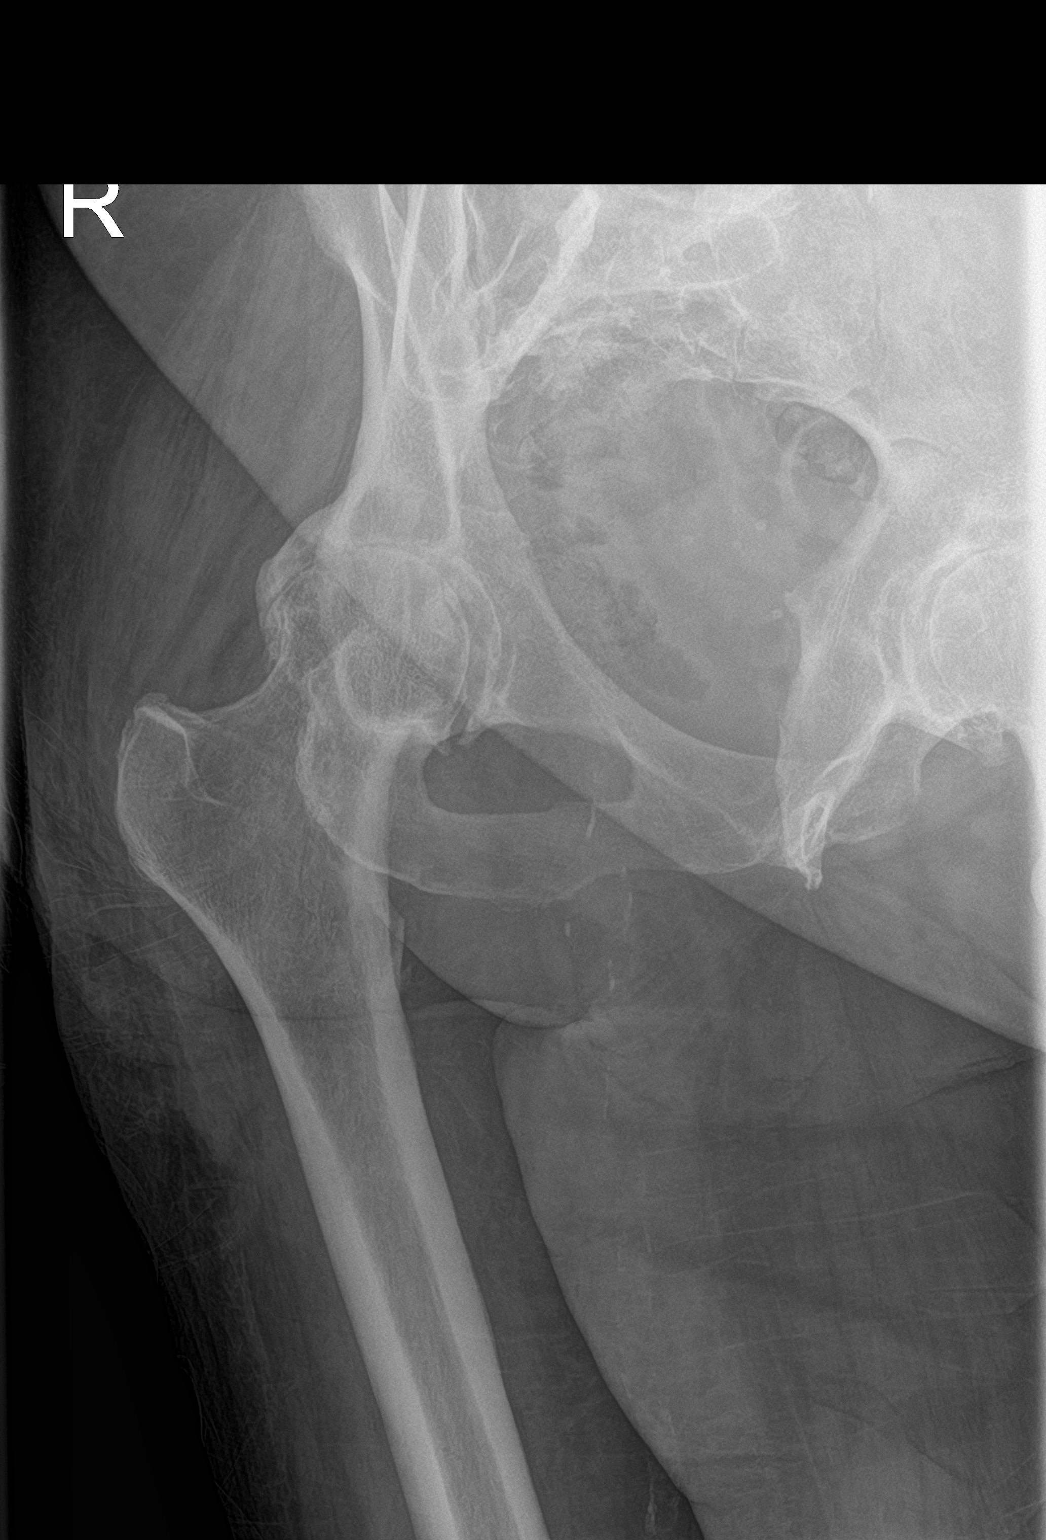

[hip lat]
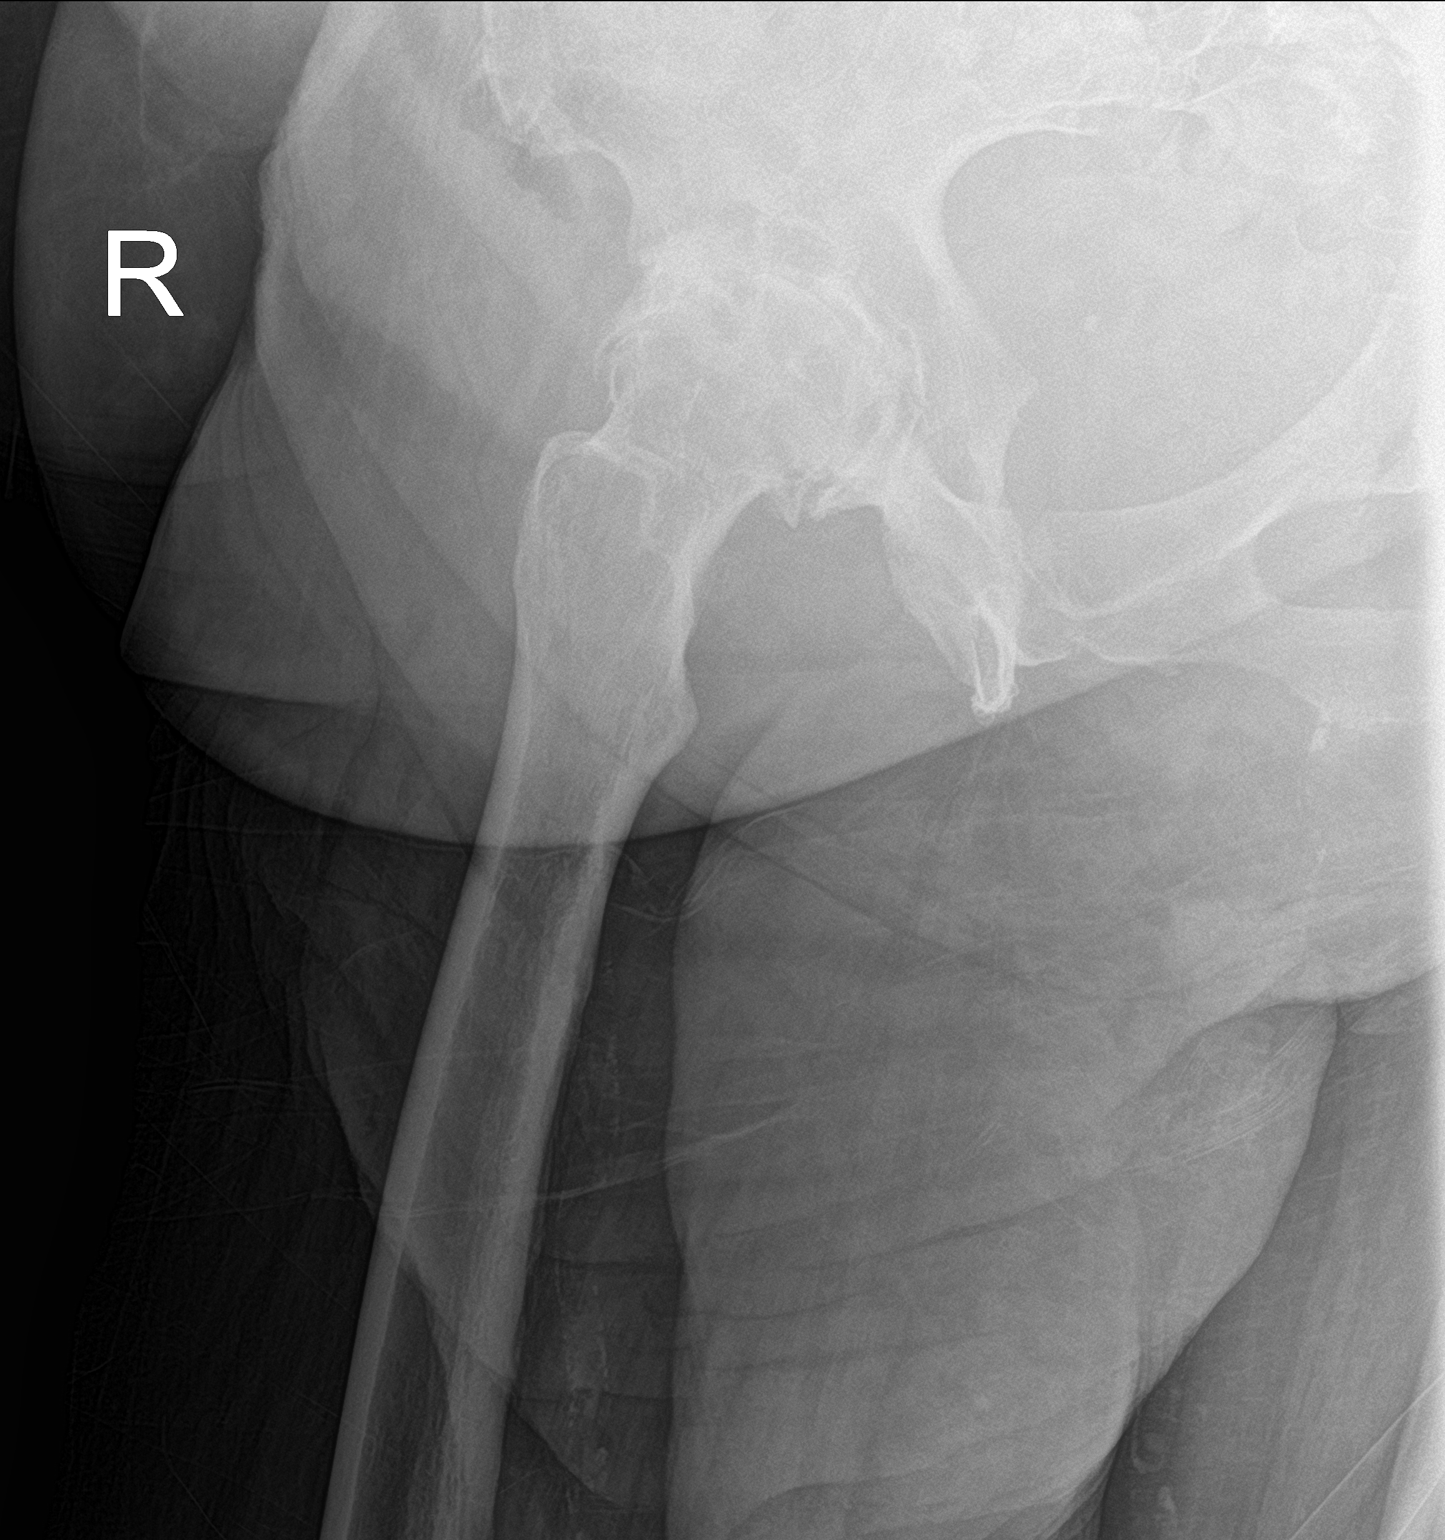

[3 of 3 positions shown; findings below may reference images not displayed]

FINDINGS: There is no hip fracture or dislocation. Severe arthritis of the
right hip joint, essentially unchanged. There is similar severe
arthritis of the left hip. No acute abnormalities.
IMPRESSION: No acute abnormality. Severe arthritis of the right hip joint.
